# Patient Record
Sex: Male | Born: 1945 | Race: White | Hispanic: No | Marital: Single | Smoking: Former smoker
Health system: Southern US, Community
[De-identification: ages and names within clinical notes are randomized; demographics above are authoritative.]

## PROBLEM LIST (undated history)

## (undated) DIAGNOSIS — E7800 Pure hypercholesterolemia, unspecified: Secondary | ICD-10-CM

## (undated) DIAGNOSIS — N189 Chronic kidney disease, unspecified: Secondary | ICD-10-CM

## (undated) DIAGNOSIS — B20 Human immunodeficiency virus [HIV] disease: Secondary | ICD-10-CM

## (undated) DIAGNOSIS — Z21 Asymptomatic human immunodeficiency virus [HIV] infection status: Secondary | ICD-10-CM

## (undated) DIAGNOSIS — K625 Hemorrhage of anus and rectum: Secondary | ICD-10-CM

## (undated) DIAGNOSIS — C3490 Malignant neoplasm of unspecified part of unspecified bronchus or lung: Secondary | ICD-10-CM

## (undated) DIAGNOSIS — F419 Anxiety disorder, unspecified: Secondary | ICD-10-CM

## (undated) DIAGNOSIS — G629 Polyneuropathy, unspecified: Secondary | ICD-10-CM

## (undated) DIAGNOSIS — F329 Major depressive disorder, single episode, unspecified: Secondary | ICD-10-CM

## (undated) DIAGNOSIS — F32A Depression, unspecified: Secondary | ICD-10-CM

## (undated) DIAGNOSIS — J90 Pleural effusion, not elsewhere classified: Secondary | ICD-10-CM

## (undated) HISTORY — PX: TONSILLECTOMY: SUR1361

---

## 2002-04-15 ENCOUNTER — Emergency Department (HOSPITAL_COMMUNITY): Admission: EM | Admit: 2002-04-15 | Discharge: 2002-04-15 | Payer: Self-pay | Admitting: Emergency Medicine

## 2008-10-18 ENCOUNTER — Inpatient Hospital Stay (HOSPITAL_COMMUNITY): Admission: EM | Admit: 2008-10-18 | Discharge: 2008-10-21 | Payer: Self-pay | Admitting: Emergency Medicine

## 2008-10-18 LAB — CONVERTED CEMR LAB
HIV-2 Ab: NEGATIVE
HIV: POSITIVE

## 2008-10-19 ENCOUNTER — Ambulatory Visit: Payer: Self-pay | Admitting: Internal Medicine

## 2008-10-19 LAB — CONVERTED CEMR LAB
CD4 T Helper %: 7 %
HCV Ab: NEGATIVE
Hep A Total Ab: NEGATIVE
Hep B S Ab: NEGATIVE
Hepatitis B Surface Ag: NEGATIVE

## 2008-10-27 ENCOUNTER — Encounter (INDEPENDENT_AMBULATORY_CARE_PROVIDER_SITE_OTHER): Payer: Self-pay | Admitting: *Deleted

## 2008-10-27 ENCOUNTER — Ambulatory Visit: Payer: Self-pay | Admitting: Internal Medicine

## 2008-10-27 DIAGNOSIS — B459 Cryptococcosis, unspecified: Secondary | ICD-10-CM | POA: Insufficient documentation

## 2008-10-27 DIAGNOSIS — K029 Dental caries, unspecified: Secondary | ICD-10-CM | POA: Insufficient documentation

## 2008-10-27 DIAGNOSIS — F17200 Nicotine dependence, unspecified, uncomplicated: Secondary | ICD-10-CM

## 2008-10-27 DIAGNOSIS — Z87898 Personal history of other specified conditions: Secondary | ICD-10-CM

## 2008-10-27 DIAGNOSIS — B20 Human immunodeficiency virus [HIV] disease: Secondary | ICD-10-CM

## 2008-10-27 LAB — CONVERTED CEMR LAB

## 2008-10-31 ENCOUNTER — Ambulatory Visit: Payer: Self-pay | Admitting: Internal Medicine

## 2008-10-31 LAB — CONVERTED CEMR LAB
ALT: 19 units/L (ref 0–53)
BUN: 23 mg/dL (ref 6–23)
Basophils Absolute: 0 10*3/uL (ref 0.0–0.1)
Calcium: 9.2 mg/dL (ref 8.4–10.5)
Chloride: 96 meq/L (ref 96–112)
Creatinine, Ser: 1.13 mg/dL (ref 0.40–1.50)
Eosinophils Absolute: 0.3 10*3/uL (ref 0.0–0.7)
Eosinophils Relative: 6 % — ABNORMAL HIGH (ref 0–5)
GFR calc non Af Amer: >60 mL/min (ref >60)
HCT: 42.5 % (ref 39.0–52.0)
Lymphocytes Relative: 32 % (ref 12–46)
Neutrophils Relative %: 48 % (ref 43–77)
Phosphorus: 4 mg/dL (ref 2.3–4.6)
Platelets: 170 10*3/uL (ref 150–400)
RDW: 13.9 % (ref 11.5–15.5)
Total Protein: 7.4 g/dL (ref 6.0–8.3)

## 2008-11-10 ENCOUNTER — Encounter: Payer: Self-pay | Admitting: Licensed Clinical Social Worker

## 2008-11-17 ENCOUNTER — Ambulatory Visit: Payer: Self-pay | Admitting: Internal Medicine

## 2008-11-23 ENCOUNTER — Ambulatory Visit: Payer: Self-pay | Admitting: Internal Medicine

## 2008-11-30 ENCOUNTER — Telehealth: Payer: Self-pay | Admitting: Internal Medicine

## 2008-12-01 ENCOUNTER — Telehealth (INDEPENDENT_AMBULATORY_CARE_PROVIDER_SITE_OTHER): Payer: Self-pay | Admitting: *Deleted

## 2008-12-08 ENCOUNTER — Ambulatory Visit: Payer: Self-pay | Admitting: Internal Medicine

## 2008-12-13 ENCOUNTER — Ambulatory Visit: Payer: Self-pay | Admitting: Internal Medicine

## 2008-12-20 ENCOUNTER — Encounter: Payer: Self-pay | Admitting: Internal Medicine

## 2008-12-20 ENCOUNTER — Ambulatory Visit: Payer: Self-pay | Admitting: Internal Medicine

## 2008-12-20 LAB — CONVERTED CEMR LAB
ALT: 15 units/L (ref 0–53)
AST: 18 units/L (ref 0–37)
Alkaline Phosphatase: 94 units/L (ref 39–117)
BUN: 20 mg/dL (ref 6–23)
Bilirubin, Direct: 0.2 mg/dL (ref 0.0–0.3)
CD4 Count: 133 microliters
Chloride: 99 meq/L (ref 96–112)
Glucose, Bld: 95 mg/dL (ref 70–99)
HIV 1 RNA Quant: 179 copies/mL
Phosphorus: 2.9 mg/dL (ref 2.3–4.6)
Potassium: 5.5 meq/L — ABNORMAL HIGH (ref 3.5–5.3)
Sodium: 135 meq/L (ref 135–145)
Total Bilirubin: 1.3 mg/dL — ABNORMAL HIGH (ref 0.3–1.2)

## 2009-01-16 ENCOUNTER — Ambulatory Visit: Payer: Self-pay | Admitting: Internal Medicine

## 2009-01-16 ENCOUNTER — Encounter: Payer: Self-pay | Admitting: Internal Medicine

## 2009-01-16 LAB — CONVERTED CEMR LAB
ALT: 13 units/L (ref 0–53)
Alkaline Phosphatase: 111 units/L (ref 39–117)
Bilirubin, Direct: 0.2 mg/dL (ref 0.0–0.3)
CO2: 27 meq/L (ref 19–32)
Chloride: 101 meq/L (ref 96–112)
Creatinine, Ser: 1.11 mg/dL (ref 0.40–1.50)
Eosinophils Absolute: 0.4 10*3/uL (ref 0.0–0.7)
GC Probe Amp, Urine: NEGATIVE
Indirect Bilirubin: 0.8 mg/dL (ref 0.0–0.9)
Lymphocytes Relative: 42 % (ref 12–46)
Lymphs Abs: 2 10*3/uL (ref 0.7–4.0)
Monocytes Relative: 12 % (ref 3–12)
Neutro Abs: 1.8 10*3/uL (ref 1.7–7.7)
Neutrophils Relative %: 37 % — ABNORMAL LOW (ref 43–77)
Platelets: 186 10*3/uL (ref 150–400)
Potassium: 4.6 meq/L (ref 3.5–5.3)
RBC: 4.2 M/uL — ABNORMAL LOW (ref 4.22–5.81)
Sodium: 138 meq/L (ref 135–145)
WBC: 4.9 10*3/uL (ref 4.0–10.5)

## 2009-01-24 ENCOUNTER — Encounter: Payer: Self-pay | Admitting: Internal Medicine

## 2009-01-30 ENCOUNTER — Encounter: Payer: Self-pay | Admitting: Internal Medicine

## 2009-02-07 ENCOUNTER — Encounter (INDEPENDENT_AMBULATORY_CARE_PROVIDER_SITE_OTHER): Payer: Self-pay | Admitting: *Deleted

## 2009-02-14 ENCOUNTER — Ambulatory Visit: Payer: Self-pay | Admitting: Internal Medicine

## 2009-02-23 ENCOUNTER — Telehealth (INDEPENDENT_AMBULATORY_CARE_PROVIDER_SITE_OTHER): Payer: Self-pay | Admitting: *Deleted

## 2009-03-16 ENCOUNTER — Ambulatory Visit: Payer: Self-pay | Admitting: Internal Medicine

## 2009-03-16 LAB — CONVERTED CEMR LAB
Albumin: 4 g/dL (ref 3.5–5.2)
BUN: 15 mg/dL (ref 6–23)
CD4 Count: 171 microliters
CO2: 25 meq/L (ref 19–32)
Chloride: 98 meq/L (ref 96–112)
Creatinine, Urine: 181.1 mg/dL
HDL: 32 mg/dL — ABNORMAL LOW (ref >39)
Indirect Bilirubin: 0.9 mg/dL (ref 0.0–0.9)
LDL Cholesterol: 107 mg/dL — ABNORMAL HIGH (ref 0–99)
Potassium: 5.1 meq/L (ref 3.5–5.3)
Total Bilirubin: 1 mg/dL (ref 0.3–1.2)
Total Protein, Urine: 51
Total Protein: 7.6 g/dL (ref 6.0–8.3)
Triglycerides: 305 mg/dL — ABNORMAL HIGH (ref <150)
VLDL: 61 mg/dL — ABNORMAL HIGH (ref 0–40)

## 2009-03-20 ENCOUNTER — Ambulatory Visit: Payer: Self-pay | Admitting: Internal Medicine

## 2009-03-20 LAB — CONVERTED CEMR LAB
BUN: 12 mg/dL
CO2: 25 meq/L
Calcium: 9.2 mg/dL
Chloride: 101 meq/L
Creatinine, Ser: 1.2 mg/dL
Glucose, Bld: 93 mg/dL
Potassium: 4.4 meq/L
Sodium: 138 meq/L

## 2009-03-23 ENCOUNTER — Telehealth (INDEPENDENT_AMBULATORY_CARE_PROVIDER_SITE_OTHER): Payer: Self-pay | Admitting: *Deleted

## 2009-04-20 ENCOUNTER — Telehealth (INDEPENDENT_AMBULATORY_CARE_PROVIDER_SITE_OTHER): Payer: Self-pay | Admitting: *Deleted

## 2009-05-08 ENCOUNTER — Ambulatory Visit: Payer: Self-pay | Admitting: Internal Medicine

## 2009-05-08 LAB — CONVERTED CEMR LAB
ALT: 12 units/L (ref 0–53)
AST: 19 units/L (ref 0–37)
Albumin: 4 g/dL (ref 3.5–5.2)
Calcium: 9.5 mg/dL (ref 8.4–10.5)
Cholesterol: 188 mg/dL (ref 0–200)
HDL: 28 mg/dL — ABNORMAL LOW (ref >39)
Phosphorus: 3.1 mg/dL (ref 2.3–4.6)
Sodium: 139 meq/L (ref 135–145)
Total CHOL/HDL Ratio: 6.7
Triglycerides: 376 mg/dL — ABNORMAL HIGH (ref <150)

## 2009-05-10 ENCOUNTER — Ambulatory Visit: Payer: Self-pay | Admitting: Internal Medicine

## 2009-05-10 LAB — CONVERTED CEMR LAB
CO2: 24 meq/L (ref 19–32)
Calcium: 9.8 mg/dL (ref 8.4–10.5)
Creatinine, Ser: 1.39 mg/dL (ref 0.40–1.50)

## 2009-05-31 ENCOUNTER — Telehealth (INDEPENDENT_AMBULATORY_CARE_PROVIDER_SITE_OTHER): Payer: Self-pay | Admitting: *Deleted

## 2009-06-14 ENCOUNTER — Ambulatory Visit: Payer: Self-pay | Admitting: Internal Medicine

## 2009-06-20 ENCOUNTER — Telehealth (INDEPENDENT_AMBULATORY_CARE_PROVIDER_SITE_OTHER): Payer: Self-pay | Admitting: *Deleted

## 2009-06-29 ENCOUNTER — Encounter (INDEPENDENT_AMBULATORY_CARE_PROVIDER_SITE_OTHER): Payer: Self-pay | Admitting: *Deleted

## 2009-07-26 ENCOUNTER — Telehealth (INDEPENDENT_AMBULATORY_CARE_PROVIDER_SITE_OTHER): Payer: Self-pay | Admitting: *Deleted

## 2009-07-31 ENCOUNTER — Encounter: Payer: Self-pay | Admitting: Internal Medicine

## 2009-07-31 ENCOUNTER — Ambulatory Visit: Payer: Self-pay | Admitting: Internal Medicine

## 2009-07-31 ENCOUNTER — Encounter: Payer: Self-pay | Admitting: *Deleted

## 2009-07-31 LAB — CONVERTED CEMR LAB
AST: 16 units/L (ref 0–37)
Albumin: 4 g/dL (ref 3.5–5.2)
Alkaline Phosphatase: 144 units/L — ABNORMAL HIGH (ref 39–117)
Bilirubin, Direct: 0.2 mg/dL (ref 0.0–0.3)
CD4 Count: 172 microliters
CO2: 23 meq/L (ref 19–32)
Calcium: 9 mg/dL (ref 8.4–10.5)
Creatinine, Ser: 0.93 mg/dL (ref 0.40–1.50)
Crypto Ag: NEGATIVE
HDL: 28 mg/dL — ABNORMAL LOW (ref >39)
Indirect Bilirubin: 0.9 mg/dL (ref 0.0–0.9)
Total Bilirubin: 1.1 mg/dL (ref 0.3–1.2)

## 2009-08-25 ENCOUNTER — Telehealth (INDEPENDENT_AMBULATORY_CARE_PROVIDER_SITE_OTHER): Payer: Self-pay | Admitting: *Deleted

## 2009-09-12 ENCOUNTER — Ambulatory Visit: Payer: Self-pay | Admitting: Internal Medicine

## 2009-09-12 DIAGNOSIS — M545 Low back pain: Secondary | ICD-10-CM

## 2009-09-14 ENCOUNTER — Telehealth (INDEPENDENT_AMBULATORY_CARE_PROVIDER_SITE_OTHER): Payer: Self-pay | Admitting: *Deleted

## 2009-10-11 ENCOUNTER — Telehealth (INDEPENDENT_AMBULATORY_CARE_PROVIDER_SITE_OTHER): Payer: Self-pay | Admitting: *Deleted

## 2009-10-23 ENCOUNTER — Ambulatory Visit: Payer: Self-pay | Admitting: Internal Medicine

## 2009-10-23 LAB — CONVERTED CEMR LAB
Albumin: 3.9 g/dL (ref 3.5–5.2)
CO2: 24 meq/L (ref 19–32)
Chloride: 102 meq/L (ref 96–112)
HDL: 34 mg/dL — ABNORMAL LOW (ref >39)
Hemoglobin, Urine: NEGATIVE
Ketones, ur: NEGATIVE mg/dL
LDL Cholesterol: 122 mg/dL — ABNORMAL HIGH (ref 0–99)
Leukocytes, UA: NEGATIVE
Nitrite: NEGATIVE
Phosphorus: 2.8 mg/dL (ref 2.3–4.6)
Potassium: 4.5 meq/L (ref 3.5–5.3)
Protein, ur: NEGATIVE mg/dL
Sodium: 138 meq/L (ref 135–145)
Total Bilirubin: 0.8 mg/dL (ref 0.3–1.2)
Total CHOL/HDL Ratio: 6.2
Total Protein, Urine: 34
VLDL: 55 mg/dL — ABNORMAL HIGH (ref 0–40)

## 2009-11-28 ENCOUNTER — Telehealth (INDEPENDENT_AMBULATORY_CARE_PROVIDER_SITE_OTHER): Payer: Self-pay | Admitting: *Deleted

## 2009-11-29 ENCOUNTER — Encounter (INDEPENDENT_AMBULATORY_CARE_PROVIDER_SITE_OTHER): Payer: Self-pay | Admitting: *Deleted

## 2009-12-26 ENCOUNTER — Ambulatory Visit: Payer: Self-pay | Admitting: Internal Medicine

## 2009-12-26 DIAGNOSIS — M2550 Pain in unspecified joint: Secondary | ICD-10-CM | POA: Insufficient documentation

## 2010-02-14 ENCOUNTER — Ambulatory Visit: Payer: Self-pay | Admitting: Internal Medicine

## 2010-02-14 ENCOUNTER — Encounter: Payer: Self-pay | Admitting: Internal Medicine

## 2010-02-14 LAB — CONVERTED CEMR LAB
ALT: 13 units/L (ref 0–53)
BUN: 18 mg/dL (ref 6–23)
Bilirubin, Direct: 0.2 mg/dL (ref 0.0–0.3)
CD4 Count: 176 microliters
CO2: 28 meq/L (ref 19–32)
Cholesterol: 196 mg/dL (ref 0–200)
Glucose, Bld: 93 mg/dL (ref 70–99)
Indirect Bilirubin: 1.2 mg/dL — ABNORMAL HIGH (ref 0.0–0.9)
Phosphorus: 2.6 mg/dL (ref 2.3–4.6)
Potassium: 4.6 meq/L (ref 3.5–5.3)
Total Bilirubin: 1.4 mg/dL — ABNORMAL HIGH (ref 0.3–1.2)
VLDL: 71 mg/dL — ABNORMAL HIGH (ref 0–40)

## 2010-02-15 ENCOUNTER — Ambulatory Visit: Payer: Self-pay | Admitting: Internal Medicine

## 2010-02-16 LAB — CONVERTED CEMR LAB
CO2: 25 meq/L (ref 19–32)
Calcium: 9.5 mg/dL (ref 8.4–10.5)
Chloride: 100 meq/L (ref 96–112)
Glucose, Bld: 102 mg/dL — ABNORMAL HIGH (ref 70–99)
Potassium: 4.3 meq/L (ref 3.5–5.3)
Sodium: 137 meq/L (ref 135–145)

## 2010-03-07 ENCOUNTER — Encounter: Payer: Self-pay | Admitting: *Deleted

## 2010-03-22 ENCOUNTER — Ambulatory Visit: Payer: Self-pay | Admitting: Internal Medicine

## 2010-04-02 ENCOUNTER — Ambulatory Visit: Payer: Self-pay | Admitting: Internal Medicine

## 2010-04-02 LAB — CONVERTED CEMR LAB
CO2: 26 meq/L (ref 19–32)
Chloride: 102 meq/L (ref 96–112)
Glucose, Bld: 101 mg/dL — ABNORMAL HIGH (ref 70–99)
Potassium: 5.3 meq/L (ref 3.5–5.3)
Sodium: 140 meq/L (ref 135–145)

## 2010-04-16 ENCOUNTER — Encounter (INDEPENDENT_AMBULATORY_CARE_PROVIDER_SITE_OTHER): Payer: Self-pay | Admitting: *Deleted

## 2010-06-05 ENCOUNTER — Encounter: Payer: Self-pay | Admitting: Internal Medicine

## 2010-06-05 ENCOUNTER — Ambulatory Visit
Admission: RE | Admit: 2010-06-05 | Discharge: 2010-06-05 | Payer: Self-pay | Source: Home / Self Care | Attending: Internal Medicine | Admitting: Internal Medicine

## 2010-06-05 LAB — CONVERTED CEMR LAB
CD4 Count: 202 microliters
HIV 1 RNA Quant: 39 copies/mL

## 2010-06-06 ENCOUNTER — Encounter: Payer: Self-pay | Admitting: Internal Medicine

## 2010-06-06 LAB — CONVERTED CEMR LAB
AST: 21 units/L (ref 0–37)
Albumin: 4.1 g/dL (ref 3.5–5.2)
Alkaline Phosphatase: 161 units/L — ABNORMAL HIGH (ref 39–117)
BUN: 17 mg/dL (ref 6–23)
Bilirubin, Direct: 0.2 mg/dL (ref 0.0–0.3)
CO2: 28 meq/L (ref 19–32)
Calcium: 10 mg/dL (ref 8.4–10.5)
Chloride: 101 meq/L (ref 96–112)
Creatinine, Ser: 1.24 mg/dL (ref 0.40–1.50)
Glucose, Bld: 98 mg/dL (ref 70–99)
HDL: 30 mg/dL — ABNORMAL LOW (ref >39)
Total Bilirubin: 1.1 mg/dL (ref 0.3–1.2)

## 2010-06-07 ENCOUNTER — Encounter (INDEPENDENT_AMBULATORY_CARE_PROVIDER_SITE_OTHER): Payer: Self-pay | Admitting: *Deleted

## 2010-06-17 LAB — CONVERTED CEMR LAB
Albumin: 3.9 g/dL (ref 3.5–5.2)
Alkaline Phosphatase: 83 units/L (ref 39–117)
CD4 Count: 100 microliters
Calcium: 9.4 mg/dL (ref 8.4–10.5)
Creatinine, Ser: 0.98 mg/dL (ref 0.40–1.50)
Creatinine, Ser: 1.05 mg/dL (ref 0.40–1.50)
Glucose, Urine, Semiquant: NEGATIVE
Hep B S Ab: NEGATIVE
Indirect Bilirubin: 0.3 mg/dL (ref 0.0–0.9)
LDL Cholesterol: 124 mg/dL — ABNORMAL HIGH (ref 0–99)
Sodium: 139 meq/L (ref 135–145)
Total Bilirubin: 0.4 mg/dL (ref 0.3–1.2)
Total CHOL/HDL Ratio: 5.9
Total Protein: 7.5 g/dL (ref 6.0–8.3)
VLDL: 46 mg/dL — ABNORMAL HIGH (ref 0–40)
WBC Urine, dipstick: NEGATIVE
pH: 5.5

## 2010-06-19 NOTE — Assessment & Plan Note (Signed)
Summary: F/U OV/VS   CC:  follow-up visit and c/o joint pain.  History of Present Illness: [name] [name] is in for his routine visit.  In May his mother was admitted to the hospital after a series of strokes.  Since that time she has developed a vascular dementia.  He is now having to provide full around the clock care for her.  This has been very stressful.  He says that he is not eating as well and has lost weight.  He also notes some aching pain in her shoulders arms hands and hips.  He is not tried taking anything for the pain.  He denies missing a single dose of his HIV medications.  Preventive Screening-Counseling & Management  Alcohol-Tobacco     Alcohol drinks/day: <1     Smoking Status: current     Smoking Cessation Counseling: yes     Smoke Cessation Stage: precontemplative     Packs/Day: 1.0     Year Started: 1966  Caffeine-Diet-Exercise     Caffeine use/day: yes     Does Patient Exercise: yes     Type of exercise: weights, and walking     Exercise (avg: min/session): 30-60     Times/week: 6  Hep-HIV-STD-Contraception     HIV Risk: no risk noted     Dental Visit-last 6 months yes  Safety-Violence-Falls     Seat Belt Use: yes      Sexual History:  n/a.        Drug Use:  never.    Comments: pt. declined condoms   Current Allergies (reviewed today): ! * UNKNOWN PAIN MEDICATION Vital Signs:  Patient profile:   65 year old male Height:      66.5 inches (168.91 cm) Weight:      115.8 pounds (52.64 kg) BMI:     18.48 Temp:     97.7 degrees F (36.50 degrees C) oral Pulse rate:   89 / minute BP sitting:   125 / 84  (left arm)  Vitals Entered By: Wendall Mola CMA Duncan Dull) (December 26, 2009 8:51 AM) CC: follow-up visit, c/o joint pain Is Patient Diabetic? No Pain Assessment Patient in pain? yes     Location: joints Intensity: 6 Type: stiffness Onset of pain  Constant Nutritional Status BMI of < 19 = underweight Nutritional Status Detail appetite  "low"  Does patient need assistance? Functional Status Self care Ambulation Normal Comments no missed doses of meds per patient   Physical Exam  General:  alert and underweight appearing.  he has lost 5 pounds. Mouth:  he has a small growth on the right corner of his mouth where he had previous perleche.  It has a benign appearance his mouth is clear. Lungs:  normal breath sounds, no crackles, and no wheezes.   Heart:  normal rate, regular rhythm, and no murmur.   Abdomen:  soft, non-tender, and normal bowel sounds.   Msk:  normal ROM, no joint tenderness, no joint swelling, no joint warmth, and no joint deformities.          Medication Adherence: 12/26/2009   Adherence to medications reviewed with patient. Counseling to provide adequate adherence provided                                Impression & Recommendations:  Problem # 1:  HIV INFECTION (ICD-042) His HIV infection remains suppressed and he is having slow CD4 reconstitution.  I will not make any changes today His updated medication list for this problem includes:    Diflucan 150 Mg Tabs (Fluconazole) .Marland Kitchen... Take 3 tablets by mouth once a day    Septra Ds 800-160 Mg Tabs (Sulfamethoxazole-trimethoprim) .Marland Kitchen... Take 1 tablet by every monday, wednesday and friday    Valtrex 1 Gm Tabs (Valacyclovir hcl) .Marland Kitchen... 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 Patient Level III (99213)Future Orders: T- * Misc. Laboratory test [phone]) ... 02/22/2010  Problem # 2:  ARTHRALGIA (ICD-719.40) I am not sure what is causing his new arthralgias but it may be related to be much more active physically since he is now providing full care for his  mother.  I have suggested that he try a low dose ibuprofen. Orders: Est. Patient Level III (60454)  Patient Instructions: 1)  Please schedule a follow-up appointment in 3 months.

## 2010-06-19 NOTE — Miscellaneous (Signed)
Summary: HIV-1 RNA, CD4 (RESEARCH)  Clinical Lists Changes  Observations: Added new observation of CD4 COUNT: 172 microliters (07/31/2009 10:10) Added new observation of HIV1RNA QA: 39 copies/mL (07/31/2009 10:10)

## 2010-06-19 NOTE — Miscellaneous (Signed)
Summary: clinical update/ryan white [place] approved til 08/18/10  Clinical Lists Changes  Observations: Added new observation of AIDSDAP: Yes 2011 (06/29/2009 16:27)

## 2010-06-19 NOTE — Assessment & Plan Note (Signed)
Summary: RESEARCH/VS    Current Allergies: ! * UNKNOWN PAIN MEDICATION Patient here to have BMET redrawn. His creatinine from yesterday was 1.36 and the calculated creatinine clearance was 43.3. If it is still <50, we will have him dose his Truvada every other day.Deirdre Evener RN  February 15, 2010 2:26 PM    Other Orders: Est. Patient Research Study [phone]) T-Basic Metabolic Panel [phone])

## 2010-06-19 NOTE — Progress Notes (Signed)
Summary: [employer]/pt assist meds arrived for Jan  Phone Note Refill Request      Prescriptions: NORVIR 100 MG TABS (RITONAVIR) 1 by mouth daily  #30 x 0   Entered by:   Paulo Fruit  BS,CPht II,MPH   Authorized by:   Cliffton Asters MD   Signed by:   Paulo Fruit  BS,CPht II,MPH on 05/31/2009   Method used:   Samples Given   RxID:   [id number] SEPTRA DS 800-160 MG TABS (SULFAMETHOXAZOLE-TRIMETHOPRIM) Take 1 tablet by mouth daily  #30 x 0   Entered by:   Paulo Fruit  BS,CPht II,MPH   Authorized by:   Cliffton Asters MD   Signed by:   Paulo Fruit  BS,CPht II,MPH on 05/31/2009   Method used:   Samples Given   RxID:   [id number] ZITHROMAX 600 MG TABS (AZITHROMYCIN) Take2 tablets by mouth once a week  #8 x 0   Entered by:   Paulo Fruit  BS,CPht II,MPH   Authorized by:   Cliffton Asters MD   Signed by:   Paulo Fruit  BS,CPht II,MPH on 05/31/2009   Method used:   Samples Given   RxID:   [id number] DIFLUCAN 150 MG TABS (FLUCONAZOLE) Take 3 tablets by mouth once a day  #90 x 0   Entered by:   Paulo Fruit  BS,CPht II,MPH   Authorized by:   Cliffton Asters MD   Signed by:   Paulo Fruit  BS,CPht II,MPH on 05/31/2009   Method used:   Samples Given   RxID:   [id number]   Patient Assist Medication Verification: Medication: Fluconazole 150mg  DUK#[id number] Exp Date:05 2012 Tech approval:MLD                Patient Assist Medication Verification: Medication: Norvir 100mg  CWC#[id number] Exp Date:20 Jul 2010 Tech approval:MLD               Patient Assist Medication Verification: Medication name:SMZ-TMP DS 800/160mg  RX # [id number] Tech approval:MLD  Patient Assist Medication Verification: Medication name:Azithromycin 600mg  RX # [id number] Tech approval:MLD  Call placed to patient with message that assistance medications are ready for pick-up. Patient stated he will pick up at his appt w/Maria 06/05/09 Paulo Fruit  BS,CPht II,MPH  May 31, 2009 3:26 PM  Appended  Document: [employer]AP/pt assist meds arrived for Jan Prescription/Samples picked up by: patient Patient completed [place] app for 2011-2012

## 2010-06-19 NOTE — Progress Notes (Signed)
Summary: [employer]/pt assist meds arrived for May  Phone Note Refill Request      Prescriptions: DIFLUCAN 150 MG TABS (FLUCONAZOLE) Take 3 tablets by mouth once a day  #90 x 0   Entered by:   Paulo Fruit  BS,CPht II,MPH   Authorized by:   Cliffton Asters MD   Signed by:   Paulo Fruit  BS,CPht II,MPH on 10/11/2009   Method used:   Samples Given   RxID:   [id number] SEPTRA DS 800-160 MG TABS (SULFAMETHOXAZOLE-TRIMETHOPRIM) Take 1 tablet by every Monday, Wednesday and Friday  #12 x 0   Entered by:   Paulo Fruit  BS,CPht II,MPH   Authorized by:   Cliffton Asters MD   Signed by:   Paulo Fruit  BS,CPht II,MPH on 10/11/2009   Method used:   Samples Given   RxID:   [id number] NORVIR 100 MG TABS (RITONAVIR) 1 by mouth daily  #30 x 0   Entered by:   Paulo Fruit  BS,CPht II,MPH   Authorized by:   Cliffton Asters MD   Signed by:   Paulo Fruit  BS,CPht II,MPH on 10/11/2009   Method used:   Samples Given   RxID:   [id number]   Patient Assist Medication Verification: Medication: Fluconazole 150mg  Lot# [id number] Exp Date:10 2012 Tech approval:MLD                Patient Assist Medication Verification: Medication:Norvir 100mg  ZDG#[id number] Exp Date:24 May 2011 Tech approval:MLD               Patient Assist Medication Verification: Medication name:SMZ-TMP DS 800/160mg  RX # [id number] Tech approval:MLD  Call placed to patient with message that assistance medications are ready for pick-up. Patient said he will pick up during his Jun 6 appt with Research nurse. Paulo Fruit  BS,CPht II,MPH  Oct 11, 2009 11:28 AM  Appended Document: [employer]/pt assist meds arrived for May Prescription/Samples picked up by: patient

## 2010-06-19 NOTE — Progress Notes (Signed)
Summary: [employer]AP/pt assist meds arrived for Apr  Phone Note Refill Request      Prescriptions: SEPTRA DS 800-160 MG TABS (SULFAMETHOXAZOLE-TRIMETHOPRIM) Take 1 tablet by every Monday, Wednesday and Friday  #12 x 0   Entered by:   Paulo Fruit  BS,CPht II,MPH   Authorized by:   Cliffton Asters MD   Signed by:   Paulo Fruit  BS,CPht II,MPH on 08/25/2009   Method used:   Samples Given   RxID:   [id number] DIFLUCAN 150 MG TABS (FLUCONAZOLE) Take 3 tablets by mouth once a day  #90 x 0   Entered by:   Paulo Fruit  BS,CPht II,MPH   Authorized by:   Cliffton Asters MD   Signed by:   Paulo Fruit  BS,CPht II,MPH on 08/25/2009   Method used:   Samples Given   RxID:   [id number] NORVIR 100 MG TABS (RITONAVIR) 1 by mouth daily  #30 x 0   Entered by:   Paulo Fruit  BS,CPht II,MPH   Authorized by:   Cliffton Asters MD   Signed by:   Paulo Fruit  BS,CPht II,MPH on 08/25/2009   Method used:   Samples Given   RxID:   [id number]  Patient Assist Medication Verification: Medication name:SMZ-TMP DS 800mg /160mg  RX # [id number] Tech approval:MLD   Patient Assist Medication Verification: Medication: Norvir 100mg  Lot# [id number] Exp Date:18 Feb 2011 Tech approval:MLD                Patient Assist Medication Verification: Medication: Fluconazole 150mg  Lot# [id number] Exp Date:09 2012 Tech approval:MLD Call placed to patient with message that assistance medications are ready for pick-up. Paulo Fruit  BS,CPht II,MPH  August 25, 2009 2:54 PM                   Appended Document: NCADAP/pt assist meds arrived for Apr Prescription/Samples picked up by: patient

## 2010-06-19 NOTE — Assessment & Plan Note (Signed)
Summary: F/U/VS   CC:  follow-up visit, takes Aleve and Osteo BiFlex, vit C, Vit D, Vit B Complete, and and a multivitamin.  History of Present Illness: [name] [name] is in for his routine visit.  He has had some low back pain radiating into his anterior thighs since he fell on the ice this past winter.  He takes occasional Advil for it but says that the pain is definitely getting better with time.  It is nearly resolved and he is back doing his usual activities.  He is started taking multivitamins and supplemental vitamin B, C, D, and Osteo Bi-Flex. He denies missing any doses of his HIV medications.  He continues to smoke cigarettes and does not feel like he is ready to quit yet.  He has not been sexually active since his diagnosis.  He says he is feeling much better overall and is pleased to be gaining weight.  Preventive Screening-Counseling & Management  Alcohol-Tobacco     Alcohol drinks/day: <1     Smoking Status: current     Smoking Cessation Counseling: yes     Smoke Cessation Stage: precontemplative     Packs/Day: 1.0     Year Started: 1966  Caffeine-Diet-Exercise     Caffeine use/day: yes     Does Patient Exercise: yes     Type of exercise: weights, and walking     Exercise (avg: min/session): 30-60     Times/week: 6  Hep-HIV-STD-Contraception     HIV Risk: no risk noted     Dental Visit-last 6 months yes  Safety-Violence-Falls     Seat Belt Use: yes      Sexual History:  n/a.        Drug Use:  never.     Updated Prior Medication List: TRUVADA 200-300 MG TABS (EMTRICITABINE-TENOFOVIR) Take one tablet by mouth every other day REYATAZ 300 MG CAPS (ATAZANAVIR SULFATE) 1 by mouth daily with ritonavir and food NORVIR 100 MG TABS (RITONAVIR) 1 by mouth daily DIFLUCAN 150 MG TABS (FLUCONAZOLE) Take 3 tablets by mouth once a day SEPTRA DS 800-160 MG TABS (SULFAMETHOXAZOLE-TRIMETHOPRIM) Take 1 tablet by every Monday, Wednesday and Friday PROCHLORPERAZINE MALEATE 10 MG  TABS (PROCHLORPERAZINE MALEATE) 1 by mouth q 6h prn VALTREX 1 GM TABS (VALACYCLOVIR HCL) 1 by mouth three times a day for 7days CENTRUM SILVER  TABS (MULTIPLE VITAMINS-MINERALS) Take 1 tablet by mouth once a day B COMPLEX VITAMINS PLUS  TABS (MULTIPLE VITAMINS-MINERALS) Take 1 tablet by mouth once a day VITAMIN C CR 500 MG CR-TABS (ASCORBIC ACID) Take 1 tablet by mouth once a day VITAMIN D 400 UNIT TABS (CHOLECALCIFEROL) Take 3 tablets by mouth once a day OSTEO BI-FLEX ADV JOINT SHIELD  TABS (MISC NATURAL PRODUCTS) Take 1 tablet by mouth once a day  Current Allergies (reviewed today): ! * UNKNOWN PAIN MEDICATION Vital Signs:  Patient profile:   64 year old male Height:      66.5 inches (168.91 cm) Weight:      124.5 pounds (56.59 kg) BMI:     19.87 Temp:     97.8 degrees F (36.56 degrees C) oral Pulse rate:   77 / minute BP sitting:   119 / 79  (left arm) Cuff size:   regular  Vitals Entered By: Jennet Maduro RN (September 12, 2009 10:08 AM) CC: follow-up visit, takes Aleve and Osteo BiFlex, vit C, Vit D, Vit B Complete, and a multivitamin Is Patient Diabetic? No Pain Assessment Patient in pain? yes  Location: lower extrimities Intensity: 4 Type: muscle pain Onset of pain  With activity Nutritional Status BMI of 19 -24 = normal Nutritional Status Detail teeth a problem at this time, going to get them taken care of next month  Have you ever been in a relationship where you felt threatened, hurt or afraid?No   Does patient need assistance? Functional Status Self care Ambulation Normal Comments no missed doses of rxes   Physical Exam  General:  alert and well-nourished.   Mouth:  new, full dentures.  He does have nonspecific cracking of his upper and lower lips. pharynx pink and moist, no erythema, and no exudates.  he has a soft raised lesion at the right corner of his mouth which is unchanged. Lungs:  normal breath sounds, no crackles, and no wheezes.   Heart:  normal  rate, regular rhythm, and no murmur.   Psych:  Oriented X3, normally interactive, good eye contact, not anxious appearing, and not depressed appearing.          Medication Adherence: 09/12/2009   Adherence to medications reviewed with patient. Counseling to provide adequate adherence provided   Prevention For Positives: 09/12/2009   Safe sex practices discussed with patient. Condoms offered.                             Impression & Recommendations:  Problem # 1:  HIV INFECTION (ICD-042) His viral load is undetectable and CD4 count is slowly reconstituting.  I will continue his current regimen. His updated medication list for this problem includes:    Diflucan 150 Mg Tabs (Fluconazole) .Marland Kitchen... Take 3 tablets by mouth once a day    Septra Ds 800-160 Mg Tabs (Sulfamethoxazole-trimethoprim) .Marland Kitchen... Take 1 tablet by every monday, wednesday and friday    Valtrex 1 Gm Tabs (Valacyclovir hcl) .Marland Kitchen... 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 Patient Level IV (99214)Future Orders: T-CD4SP ([facility]) (CD4SP) ... 12/11/2009 T-HIV Viral Load [phone]) ... 12/11/2009  Problem # 2:  LOW BACK PAIN, ACUTE (ICD-724.2) His acute low back pain is improving. Orders: Est. Patient Level IV (09323)  Problem # 3:  CIGARETTE SMOKER (ICD-305.1) I have encouraged him to quit smoking cigarettes. Orders: Est. Patient Level IV (55732)  Medications Added to Medication List This Visit: 1)  Centrum Silver Tabs (Multiple vitamins-minerals) .... Take 1 tablet by mouth once a day 2)  Vitamin D 400 Unit Tabs (Cholecalciferol) .... Take 3 tablets by mouth once a day 3)  Osteo Bi-flex Adv Joint Shield Tabs (Misc  natural products) .... Take 1 tablet by mouth once a day  Other Orders: Future Orders: T- * Misc. Laboratory test [phone]) ... 12/12/2009  Patient Instructions: 1)  Please schedule a follow-up appointment in 3 months.

## 2010-06-19 NOTE — Miscellaneous (Signed)
  Clinical Lists Changes 

## 2010-06-19 NOTE — Progress Notes (Signed)
Summary: [employer]/pt assist meds arrived for apr  Phone Note Refill Request      Prescriptions: SEPTRA DS 800-160 MG TABS (SULFAMETHOXAZOLE-TRIMETHOPRIM) Take 1 tablet by every Monday, Wednesday and Friday  #12 x 0   Entered by:   Paulo Fruit  BS,CPht II,MPH   Authorized by:   Cliffton Asters MD   Signed by:   Paulo Fruit  BS,CPht II,MPH on 09/14/2009   Method used:   Samples Given   RxID:   [id number] NORVIR 100 MG TABS (RITONAVIR) 1 by mouth daily  #30 x 0   Entered by:   Paulo Fruit  BS,CPht II,MPH   Authorized by:   Cliffton Asters MD   Signed by:   Paulo Fruit  BS,CPht II,MPH on 09/14/2009   Method used:   Samples Given   RxID:   [id number] DIFLUCAN 150 MG TABS (FLUCONAZOLE) Take 3 tablets by mouth once a day  #90 x 0   Entered by:   Paulo Fruit  BS,CPht II,MPH   Authorized by:   Cliffton Asters MD   Signed by:   Paulo Fruit  BS,CPht II,MPH on 09/14/2009   Method used:   Samples Given   RxID:   [id number]   Patient Assist Medication Verification: Medication: Fluconazole 150mg  Lot# [id number] Exp Date:09 2012 Tech approval:MLD                Patient Assist Medication Verification: Medication:Norvir 100mg  Lot# [id number] Exp Date:19 Jan 2011 Tech approval:MLD               Patient Assist Medication Verification: Medication name:SMZ-TMP DS 800/160mg  RX # [id number] Tech approval:MLD Call placed to patient with message that assistance medications are ready for pick-up.' Left message for patient to contact [facility] @ [phone] Paulo Fruit  BS,CPht II,MPH  September 14, 2009 4:05 PM

## 2010-06-19 NOTE — Miscellaneous (Signed)
Summary: Orders Update  Clinical Lists Changes  Orders: Added new Test order of T-RPR (Syphilis) [phone]) - Signed Added new Test order of T-Cryptococcal Ag Blood [facility]) (CRPT) - Signed Added new Test order of T- * Misc. Laboratory test [phone]) - Signed     Process Orders Check Orders Results:     [facility]: ABN not required for this insurance Tests Sent for requisitioning (July 31, 2009 10:20 AM):     07/31/2009: [facility] -- T-RPR (Syphilis) [phone] (signed)     07/31/2009: [facility] -- T- * Misc. Laboratory test [id number] (signed)

## 2010-06-19 NOTE — Assessment & Plan Note (Signed)
Summary: 3 MONTH F/U [MKJ]   CC:  follow-up visit, taking total care of his mother, vascular demetia, and skin and mucous membranes around oral area giving him a lot of problems.  History of Present Illness: [name] [name] is in for his routine visit.  He does not recall missing any doses of his HIV medications.  His only current problems have been aching in his muscles and joints.  He finds that if he takes one Aleve every two to 3 days this provides significant relief.  He continues to care for his aging mother who has the early stages of dementia.  His appetite is better and he has gained some weight.  His only other problem has been the cracking of the lips which he finds quite bothersome.  He is tried a variety of over-the-counter preparations including Chapstick and Blistex without any relief.  He continues to smoke cigarettes and states that he is not ready to quit.  Preventive Screening-Counseling & Management  Alcohol-Tobacco     Alcohol drinks/day: <1     Smoking Status: current     Smoking Cessation Counseling: yes     Smoke Cessation Stage: precontemplative     Packs/Day: 1.0     Year Started: 1966  Caffeine-Diet-Exercise     Caffeine use/day: yes     Does Patient Exercise: yes     Type of exercise: weights, and walking     Exercise (avg: min/session): 30-60     Times/week: 6  Hep-HIV-STD-Contraception     HIV Risk: no risk noted     HIV Risk Counseling: not indicated-no HIV risk noted     Dental Visit-last 6 months yes, going to get his dentures 'realigned  Safety-Violence-Falls     Seat Belt Use: yes  Comments: declined condoms      Sexual History:  n/a.        Drug Use:  never.     Prior Medication List:  TRUVADA 200-300 MG TABS (EMTRICITABINE-TENOFOVIR) Take one tablet by mouth every other day REYATAZ 300 MG CAPS (ATAZANAVIR SULFATE) 1 by mouth daily with ritonavir and food NORVIR 100 MG TABS (RITONAVIR) 1 by mouth daily DIFLUCAN 150 MG TABS (FLUCONAZOLE) Take 3  tablets by mouth once a day SEPTRA DS 800-160 MG TABS (SULFAMETHOXAZOLE-TRIMETHOPRIM) Take 1 tablet by every Monday, Wednesday and Friday PROCHLORPERAZINE MALEATE 10 MG TABS (PROCHLORPERAZINE MALEATE) 1 by mouth q 6h prn VALTREX 1 GM TABS (VALACYCLOVIR HCL) 1 by mouth three times a day for 7days CENTRUM SILVER  TABS (MULTIPLE VITAMINS-MINERALS) Take 1 tablet by mouth once a day VITAMIN D 400 UNIT TABS (CHOLECALCIFEROL) Take 3 tablets by mouth once a day   Current Allergies (reviewed today): ! * UNKNOWN PAIN MEDICATION Vital Signs:  Patient profile:   65 year old male Height:      66.5 inches (168.91 cm) Weight:      123.5 pounds (56.14 kg) BMI:     19.71 Pulse rate:   71 / minute BP sitting:   119 / 74  (left arm) Cuff size:   regular  Vitals Entered By: Jennet Maduro RN (March 22, 2010 9:17 AM) CC: follow-up visit,  taking total care of his mother, vascular demetia, skin and mucous membranes around oral area giving him a lot of problems Is Patient Diabetic? No Pain Assessment Patient in pain? no      Nutritional Status BMI of 19 -24 = normal Nutritional Status Detail appetite "better"  Have you ever been in a relationship where  you felt threatened, hurt or afraid?No   Does patient need assistance? Functional Status Self care Ambulation Normal Comments no missed doses   Physical Exam  General:  alert and well-nourished.   Mouth:  he has a small growth on the right corner of his mouth where he had previous perleche.  It has a benign appearance his mouth is clear. he continues to have cracking of his lip strictly his lower lip. Lungs:  normal breath sounds, no crackles, and no wheezes.   Heart:  normal rate, regular rhythm, and no murmur.   Abdomen:  soft, non-tender, and normal bowel sounds.   Msk:  normal ROM, no joint tenderness, no joint swelling, no joint warmth, no redness over joints, and no joint deformities.   Skin:  no rashes.   Psych:  Oriented X3,  normally interactive, good eye contact, not anxious appearing, and not depressed appearing.      Impression & Recommendations:  Problem # 1:  HIV INFECTION (ICD-042) His infection remains under good control and he is having slow CD4 reconstitution.  I will continue his current regimen, monitor his creatinine and determine if we need to make other adjustments in the doses of his medications. His updated medication list for this problem includes:    Diflucan 150 Mg Tabs (Fluconazole) .Marland Kitchen... Take 3 tablets by mouth once a day    Septra Ds 800-160 Mg Tabs (Sulfamethoxazole-trimethoprim) .Marland Kitchen... Take 1 tablet by every monday, wednesday and friday    Valtrex 1 Gm Tabs (Valacyclovir hcl) .Marland Kitchen... 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  Once his CD4 count is over 200 will stop fluconazole. Orders: Est. 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  I will have him try over-the-counter low potency cortisone cream along with Vaseline.  I've also asked him to speak with his dentist when he returns to have his dentures refitted Orders: Est. Patient Level IV (09811)  Problem # 4:  ARTHRALGIA (ICD-719.40) I do not see any obvious cause for his arthralgias and myalgias and think it is probably best for him to continue to use occasional NSAIDs for symptomatic relief.  Of course we will need to monitor his renal function on this. Orders: Est. Patient Level IV (91478)  Problem # 5:  CIGARETTE SMOKER (ICD-305.1) I have encouraged him to  quit smoking cigarettes. Orders: Est. Patient Level IV (29562)  Medications Added to Medication List This Visit: 1)  Truvada 200-300 Mg Tabs (Emtricitabine-tenofovir) .... Take one tablet by mouth every other day  Patient Instructions: 1)  Please schedule a follow-up appointment in 6 months.   Prevention & Chronic Care Immunizations   Influenza vaccine: Fluvax Non-MCR  (02/14/2010)    Tetanus booster: Not documented    Pneumococcal vaccine: Pneumovax  (10/27/2008)    H. zoster vaccine: Not documented  Colorectal Screening   Hemoccult: Not documented    Colonoscopy: Not documented  Other Screening   PSA: Not documented   Smoking status: current  (03/22/2010)   Smoking cessation counseling: yes  (03/22/2010)  Lipids   Total Cholesterol: 196  (02/14/2010)   LDL: 96  (02/14/2010)   LDL Direct: Not documented   HDL: 29  (02/14/2010)   Triglycerides: 354  (02/14/2010)

## 2010-06-19 NOTE — Assessment & Plan Note (Signed)
  Nurse Visit   Allergies: 1)  ! * Unknown Pain Medication Patient had cryptoantigen drawn while here for research labs.Deirdre Evener RN  February 15, 2010 2:28 PM

## 2010-06-19 NOTE — Miscellaneous (Signed)
Summary: HIV-1 RNA, CD4 (RESEARCH)  Clinical Lists Changes  Observations: Added new observation of CD4 COUNT: 130 microliters (05/08/2009 9:15) Added new observation of HIV1RNA QA: 39 copies/mL (05/08/2009 9:15)

## 2010-06-19 NOTE — Assessment & Plan Note (Signed)
Summary: STUDY APPT/ LH   Preventive Screening-Counseling & Management  Alcohol-Tobacco     Smoking Status: current     Packs/Day: 1.0   Current Allergies: ! * UNKNOWN PAIN MEDICATION Vital Signs:  Patient profile:   65 year old male Height:      66.5 inches (168.91 cm) Weight:      120.8 pounds (54.91 kg) BMI:     19.27 Temp:     97.4 degrees F oral Pulse rate:   60 / minute Resp:     16 per minute BP sitting:   108 / 75  (right arm) Is Patient Diabetic? No Pain Assessment Patient in pain? yes     Location: lower back Onset of pain  chronic lower back pain and stiffness Nutritional Status BMI of 19 -24 = normal  Does patient need assistance? Functional Status Self care Ambulation Normal  Patient here for week 48 study visit. He continues to have chronic lower back pain, stiffness and severe numbness in both feet. Lips are dry, but most skin issues have resolved. Adherence has been very good, with no missed doses. Will continue to monitor his renal function.Deirdre Evener RN  October 23, 2009 10:21 AM   Other Orders: Est. Patient Research Study [phone]) T-Basic Metabolic Panel [id number]) T-Hepatic Function [id number]) T-Lipid Profile [id number]) T-Phosphorus [id number]) T-Urine Protein [id number]) T-Urine Creatinine [phone]) T-Urinalysis ([id number])  Process Orders Check Orders Results:     [facility]: ABN not required for this insurance Tests Sent for requisitioning (October 23, 2009 10:06 AM):     10/23/2009: [facility] -- T-Basic Metabolic Panel [phone] (signed)     10/23/2009: [facility] -- T-Hepatic Function [phone] (signed)     10/23/2009: [facility] -- T-Lipid Profile [phone] (signed)     10/23/2009: [facility] -- T-Phosphorus [[id number]] (signed)     10/23/2009: [facility] -- T-Urine Protein [phone] (signed)  10/23/2009: [facility] -- T-Urine Creatinine [[id number]] (signed)     10/23/2009: [facility] -- T-Urinalysis [[id number]] (signed)

## 2010-06-19 NOTE — Progress Notes (Signed)
Summary: New scripts--[facility]  Phone Note From Pharmacy   Caller: [facility] Montel Culver. Reason for Call: Needs renewal Summary of Call: Received a message from new [facility] pharmacy requeting new prescriptions for Truvada, Norvir, Fluconazole, and Bactrim. These were not transfered from [facility]. Initial call taken by: Paulo Fruit  BS,CPht II,MPH,  November 28, 2009 1:33 PM    Prescriptions: SEPTRA DS 800-160 MG TABS (SULFAMETHOXAZOLE-TRIMETHOPRIM) Take 1 tablet by every Monday, Wednesday and Friday  #30 x 10   Entered by:   Paulo Fruit  BS,CPht II,MPH   Authorized by:   Cliffton Asters MD   Signed by:   Paulo Fruit  BS,CPht II,MPH on 11/28/2009   Method used:   Electronically to        [facility] [phone]* (retail)       [address]       [place], [place]  [address]       Ph: [phone]       Fax:    RxID:   [id number] DIFLUCAN 150 MG TABS (FLUCONAZOLE) Take 3 tablets by mouth once a day  #90 x 10   Entered by:   Paulo Fruit  BS,CPht II,MPH   Authorized by:   Cliffton Asters MD   Signed by:   Paulo Fruit  BS,CPht II,MPH on 11/28/2009   Method used:   Electronically to        [facility] [phone]* (retail)       [address]       [place], [place]  [address]       Ph: [phone]       Fax:    RxID:   [id number] NORVIR 100 MG TABS (RITONAVIR) 1 by mouth daily  #30 x 10   Entered by:   Paulo Fruit  BS,CPht II,MPH   Authorized by:   Cliffton Asters MD   Signed by:   Paulo Fruit  BS,CPht II,MPH on 11/28/2009   Method used:   Electronically to        [facility] [phone]* (retail)       [address]       [place], [place]  [address]       Ph: [phone]       Fax:    RxID:   [id number]  Paulo Fruit  BS,CPht II,MPH  November 28, 2009 1:34 PM

## 2010-06-19 NOTE — Assessment & Plan Note (Signed)
Summary: STUDY APPT/ LH    Current Allergies: ! * UNKNOWN PAIN MEDICATION Vital Signs:  Patient profile:   65 year old male Weight:      125 pounds (56.82 kg) BMI:     19.95 Temp:     97.0 degrees F oral Pulse rate:   68 / minute Resp:     18 per minute BP sitting:   103 / 78  (right arm) Is Patient Diabetic? No Pain Assessment Patient in pain? yes     Location: lower back Intensity: 4 Type: aching Onset of pain  movement Nutritional Status BMI of 19 -24 = normal  Does patient need assistance? Functional Status Self care Ambulation Normal   Patient here for week 36 study visit. He has recently got over a cold with cough and nasal congestion symptoms. He c/o moderate stiffness, aching in his lower back. He says it has been like this ever since he fell in January on some ice. He also has generalized muscle aches and has recently started taking osteobiflex. There are red, scaly patches on his face and feet. His left thigh has a circular scaly patch on that as well that has been there for a while. Lips continue to be very dry and chapped. He continues to take his truvada every other day and we will assess his renal function with today's labs.Deirdre Evener RN  July 31, 2009 10:19 AM   Other Orders: Est. Patient Research Study [id number]) T-Basic Metabolic Panel [id number]) T-Hepatic Function [id number]) T-Lipid Profile [id number]) T-Phosphorus [id number]) Process Orders Check Orders Results:     [facility]: ABN not required for this insurance Tests Sent for requisitioning (July 31, 2009 9:46 AM):     07/31/2009: [facility] -- T-Basic Metabolic Panel [phone] (signed)     07/31/2009: [facility] -- T-Hepatic Function [phone] (signed)     07/31/2009: [facility] -- T-Lipid Profile [phone] (signed)     07/31/2009: [facility] -- T-Phosphorus [phone] (signed)

## 2010-06-19 NOTE — Assessment & Plan Note (Signed)
Summary: STUDY APPT/ LH    Current Allergies: ! * UNKNOWN PAIN MEDICATION Vital Signs:  Patient profile:   65 year old male Weight:      119.8 pounds (54.45 kg) Temp:     97.5 degrees F oral Pulse rate:   50 / minute Resp:     16 per minute BP sitting:   106 / 84  Serial Vital Signs/Assessments:  Time      Position  BP       Pulse  Resp  Temp     By 0845                106/84                         Deirdre Evener RN [phone]                112/80                         Deirdre Evener RN   Patient here for week 64 study visit. He is doing well except for having to work hard taking care of his mother. He still has alot of joint pain and stiffness. Lips remain dry. He does say that he is not as fatigued as he was. Continues to be very adherent. He will return in January for followup.Deirdre Evener RN  February 14, 2010 9:23 AM      Other Orders: Est. Patient Research Study [id number]) T-Basic Metabolic Panel [phone]) T-Hepatic Function [id number]) T-Lipid Profile [phone]) T-Phosphorus [phone])

## 2010-06-19 NOTE — Assessment & Plan Note (Signed)
Summary: RESFROM 1/17   CC:  follow-up visit, left shoulder is stiff, unable to raise higher than 3/4 of the way to shoulder, and Depression.  History of Present Illness: [name] [name] is in for his routine visit. He continues to have some stiffness and discomfort in his muscles and joints when he first gets up but overall he is feeling much better.  His appetite is better.  He has not had any fever, headache, cough, shortness of breath or diarrhea.  His Truvada was changed to every other day due to a low creatinine clearance.  He is not had any problem tolerating his medications and appears to be taking them correctly.  He denies missing any doses.  Depression History:      The patient denies a depressed mood most of the day and a diminished interest in his usual daily activities.        Preventive Screening-Counseling & Management  Alcohol-Tobacco     Alcohol drinks/day: <1     Smoking Status: current     Smoking Cessation Counseling: yes     Smoke Cessation Stage: precontemplative     Packs/Day: 1.0     Year Started: 1966  Caffeine-Diet-Exercise     Caffeine use/day: yes     Does Patient Exercise: yes     Type of exercise: weights, and walking     Exercise (avg: min/session): 30-60     Times/week: 6  Hep-HIV-STD-Contraception     HIV Risk: no risk noted  Safety-Violence-Falls     Seat Belt Use: yes  Comments: declined condoms      Sexual History:  n/a.        Drug Use:  never.     Updated Prior Medication List: TRUVADA 200-300 MG TABS (EMTRICITABINE-TENOFOVIR) Take one tablet by mouth every other day REYATAZ 300 MG CAPS (ATAZANAVIR SULFATE) 1 by mouth daily with ritonavir and food NORVIR 100 MG TABS (RITONAVIR) 1 by mouth daily DIFLUCAN 150 MG TABS (FLUCONAZOLE) Take 3 tablets by mouth once a day SEPTRA DS 800-160 MG TABS (SULFAMETHOXAZOLE-TRIMETHOPRIM) Take 1 tablet by mouth daily ZITHROMAX 600 MG TABS (AZITHROMYCIN) Take2 tablets by mouth once a week PROCHLORPERAZINE  MALEATE 10 MG TABS (PROCHLORPERAZINE MALEATE) 1 by mouth q 6h prn VALTREX 1 GM TABS (VALACYCLOVIR HCL) 1 by mouth three times a day for 7days  Current Allergies (reviewed today): ! * UNKNOWN PAIN MEDICATION Vital Signs:  Patient profile:   65 year old male Height:      66.5 inches (168.91 cm) Weight:      122.7 pounds (55.77 kg) BMI:     19.58 Temp:     97.5 degrees F (36.39 degrees C) oral Pulse rate:   88 / minute BP sitting:   110 / 68  (left arm) Cuff size:   regular  Vitals Entered By: Jennet Maduro RN (June 14, 2009 3:31 PM) CC: follow-up visit, left shoulder is stiff, unable to raise higher than 3/4 of the way to shoulder, Depression Is Patient Diabetic? No Pain Assessment Patient in pain? no      Nutritional Status BMI of 19 -24 = normal Nutritional Status Detail appetite "pretty good, still drinking Ensure 1-2 a day "  Have you ever been in a relationship where you felt threatened, hurt or afraid?No   Does patient need assistance? Functional Status Self care Ambulation Normal Comments no missed doses of rxes   Physical Exam  General:  alert and underweight appearing.   Mouth:  new, full dentures.  He  does have nonspecific cracking of his upper and lower lips. pharynx pink and moist, no erythema, and no exudates.  he has a soft raised lesion at the right corner of his mouth which is unchanged. Neck:  supple.   Lungs:  normal breath sounds, no crackles, and no wheezes.   Heart:  normal rate, regular rhythm, and no murmur.   Abdomen:  soft, non-tender, and normal bowel sounds.   Skin:  no rashes.   Psych:  Oriented X3, normally interactive, good eye contact, not anxious appearing, and not depressed appearing.      Impression & Recommendations:  Problem # 1:  HIV INFECTION (ICD-042) [place]'s HIV infection it is suppressed on his current regimen.  However he has had very slow CD4 reconstitution.  I will decrease his Septra to every Monday Wednesday Friday in  case it is causing some bone marrow suppression.  With a CD4 count over 100 it is okay to stop Zithromax now.  I will check a cryptococcal antigen with his next set of blood work and consider stopping Diflucan soon. The following medications were removed from the medication list:    Zithromax 600 Mg Tabs (Azithromycin) .Marland Kitchen... Take2 tablets by mouth once a week His updated medication list for this problem includes:    Diflucan 150 Mg Tabs (Fluconazole) .Marland Kitchen... Take 3 tablets by mouth once a day    Septra Ds 800-160 Mg Tabs (Sulfamethoxazole-trimethoprim) .Marland Kitchen... Take 1 tablet by every monday, wednesday and friday    Valtrex 1 Gm Tabs (Valacyclovir hcl) .Marland Kitchen... 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 Patient Level IV (14782)  Medications Added to Medication List This Visit: 1)  Truvada 200-300 Mg Tabs (Emtricitabine-tenofovir) .... Take one tablet by mouth every other day 2)  Septra Ds 800-160 Mg Tabs (Sulfamethoxazole-trimethoprim) .... Take 1 tablet by every monday, wednesday and friday  Other Orders: Hepatitis B Vaccine >37yrs [id number]) Admin 1st Vaccine (30865) Admin 1st Vaccine [facility]) [phone])  Patient Instructions: 1)  Please schedule a follow-up appointment in 3 months.        Hepatitis B Vaccine # 3    Vaccine Type: HepB Adult    Site: right deltoid    Mfr: Merck    Dose: 0.5 ml    Route: IM    Given by: Jennet Maduro RN    Exp. Date: 02/25/2011    Lot #: [id number]

## 2010-06-19 NOTE — Miscellaneous (Signed)
Summary: HIV-1 RNA, CD4 (RESEARCH)  Clinical Lists Changes  Observations: Added new observation of CD4 COUNT: 182 microliters (10/23/2009 16:07) Added new observation of HIV1RNA QA: 39 copies/mL (10/23/2009 16:07)

## 2010-06-19 NOTE — Assessment & Plan Note (Signed)
Summary: FLU SHOT  Prior Medications: TRUVADA 200-300 MG TABS (EMTRICITABINE-TENOFOVIR) Take one tablet by mouth every other day REYATAZ 300 MG CAPS (ATAZANAVIR SULFATE) 1 by mouth daily with ritonavir and food NORVIR 100 MG TABS (RITONAVIR) 1 by mouth daily DIFLUCAN 150 MG TABS (FLUCONAZOLE) Take 3 tablets by mouth once a day SEPTRA DS 800-160 MG TABS (SULFAMETHOXAZOLE-TRIMETHOPRIM) Take 1 tablet by every Monday, Wednesday and Friday PROCHLORPERAZINE MALEATE 10 MG TABS (PROCHLORPERAZINE MALEATE) 1 by mouth q 6h prn VALTREX 1 GM TABS (VALACYCLOVIR HCL) 1 by mouth three times a day for 7days CENTRUM SILVER  TABS (MULTIPLE VITAMINS-MINERALS) Take 1 tablet by mouth once a day VITAMIN D 400 UNIT TABS (CHOLECALCIFEROL) Take 3 tablets by mouth once a day Current Allergies: ! * UNKNOWN PAIN MEDICATION Immunizations Administered:  Influenza Vaccine # 1:    Vaccine Type: Fluvax Non-MCR    Site: right deltoid    Mfr: Novartis    Dose: 0.5 ml    Route: IM    Given by: Deirdre Evener RN    Exp. Date: 09/02/2010    Lot #: [id number]    VIS given: 12/12/09 version given February 14, 2010.  Orders Added: 1)  Influenza Vaccine NON MCR [00028]

## 2010-06-19 NOTE — Progress Notes (Signed)
Summary: [facility]/pt assist meds arrived for Feb  Phone Note Refill Request      Prescriptions: SEPTRA DS 800-160 MG TABS (SULFAMETHOXAZOLE-TRIMETHOPRIM) Take 1 tablet by every Monday, Wednesday and Friday  #12 x 0   Entered by:   Paulo Fruit  BS,CPht II,MPH   Authorized by:   Cliffton Asters MD   Signed by:   Paulo Fruit  BS,CPht II,MPH on 06/20/2009   Method used:   Samples Given   RxID:   [id number] DIFLUCAN 150 MG TABS (FLUCONAZOLE) Take 3 tablets by mouth once a day  #90 x 0   Entered by:   Paulo Fruit  BS,CPht II,MPH   Authorized by:   Cliffton Asters MD   Signed by:   Paulo Fruit  BS,CPht II,MPH on 06/20/2009   Method used:   Samples Given   RxID:   [id number] NORVIR 100 MG TABS (RITONAVIR) 1 by mouth daily  #30 x 0   Entered by:   Paulo Fruit  BS,CPht II,MPH   Authorized by:   Cliffton Asters MD   Signed by:   Paulo Fruit  BS,CPht II,MPH on 06/20/2009   Method used:   Samples Given   RxID:   [id number]  Patient Assist Medication Verification: Medication name: SMZ-TMP DS 800/160mg  RX # [id number] Tech approval:MLD   Patient Assist Medication Verification: Medication:Norvir 100mg  QVZ#[id number] Exp Date:20 Sep 2010 Tech approval:MLD                Patient Assist Medication Verification: Medication:Fluconazole 150mg  Lot# [id number] Exp Date:08 2012 Tech approval:MLD Call placed to patient with message that assistance medications are ready for pick-up. Paulo Fruit  BS,CPht II,MPH  June 20, 2009 4:15 PM

## 2010-06-19 NOTE — Miscellaneous (Signed)
Summary: clinical update/ryan white  Clinical Lists Changes  Observations: Added new observation of YEARLYEXPEN: 3250  (11/29/2009 13:53) Added new observation of PCTFPL: 182.94  (11/29/2009 13:53) Added new observation of HOUSEINCOME: 16109  (11/29/2009 13:53) Added new observation of FINASSESSDT: 10/27/2009  (11/29/2009 13:53)

## 2010-06-19 NOTE — Assessment & Plan Note (Signed)
Summary: STUDY [MKJ]    Current Allergies: ! * UNKNOWN PAIN MEDICATION Vital Signs:  Patient profile:   65 year old male Weight:      125.25 pounds (56.93 kg)  Patient here to have creatinine rechecked for truvada dosing. No new problems noted.Deirdre Evener RN  April 02, 2010 2:20 PM    Other Orders: Est. Patient Research Study [phone]) T-Basic Metabolic Panel [phone])

## 2010-06-19 NOTE — Miscellaneous (Signed)
Summary: HIV-1 RNA, CD4 (RESEARCH)  Clinical Lists Changes  Observations: Added new observation of CD4 COUNT: 176 microliters (02/14/2010 14:16) Added new observation of HIV1RNA QA: 39 copies/mL (02/14/2010 14:16)

## 2010-06-19 NOTE — Progress Notes (Signed)
Summary: [facility]/pt assist meds arrived for Mar  Phone Note Refill Request      Prescriptions: SEPTRA DS 800-160 MG TABS (SULFAMETHOXAZOLE-TRIMETHOPRIM) Take 1 tablet by every Monday, Wednesday and Friday  #12 x 0   Entered by:   Paulo Fruit  BS,CPht II,MPH   Authorized by:   Cliffton Asters MD   Signed by:   Paulo Fruit  BS,CPht II,MPH on 07/26/2009   Method used:   Samples Given   RxID:   [id number] DIFLUCAN 150 MG TABS (FLUCONAZOLE) Take 3 tablets by mouth once a day  #90 x 0   Entered by:   Paulo Fruit  BS,CPht II,MPH   Authorized by:   Cliffton Asters MD   Signed by:   Paulo Fruit  BS,CPht II,MPH on 07/26/2009   Method used:   Samples Given   RxID:   [id number] NORVIR 100 MG TABS (RITONAVIR) 1 by mouth daily  #30 x 0   Entered by:   Paulo Fruit  BS,CPht II,MPH   Authorized by:   Cliffton Asters MD   Signed by:   Paulo Fruit  BS,CPht II,MPH on 07/26/2009   Method used:   Samples Given   RxID:   [id number]  Patient Assist Medication Verification: Medication name: SMZ-TMP DS 800/160mg  RX # [id number] Tech approval:MLD   Patient Assist Medication Verification: Medication: Norvir 100mg  DGU#[id number] Exp Date:20 Sep 2010 Tech approval:MLD                Patient Assist Medication Verification: Medication:Fluconazole 150mg  QQV#[id number] Exp Date:12/2010 Tech approval:MLD               Tried to contact patient, but number on file has been disconnected Paulo Fruit  BS,CPht II,MPH  July 26, 2009 3:31 PM

## 2010-06-21 NOTE — Miscellaneous (Signed)
  Clinical Lists Changes 

## 2010-06-21 NOTE — Assessment & Plan Note (Signed)
Summary: STUDY APPT/ LH   Preventive Screening-Counseling & Management  Alcohol-Tobacco     Smoking Status: current     Packs/Day: 1.0   Current Allergies: ! * UNKNOWN PAIN MEDICATION Vital Signs:  Patient profile:   65 year old male Weight:      126 pounds (57.27 kg) BMI:     20.10 Temp:     98.2 degrees F oral Pulse rate:   72 / minute Resp:     16 per minute BP sitting:   114 / 79  (right arm) Is Patient Diabetic? No   Patient here for week 80 study visit. He has noticeable growths on his right lip, that look like HPV warts that are bothering him and he wants them removed. He also expresses an increase in depression related to loneliness. He has met with Serina Cowper and is trying to develop some relationships with other positive people. He also states that his penis is bent forward which occurred suddenly about 2 months ago. This probably bothers him more than anything else. I instructed him to make an appt to see Dr. Orvan Falconer soon for a referral for urology and ? dermatology. Deirdre Evener RN  Other Orders: Est. Patient Research Study [phone]) T-Basic Metabolic Panel [id number]) T-Lipid Profile [phone]) T-Phosphorus [phone]) T-Hepatic Function [phone])

## 2010-06-21 NOTE — Miscellaneous (Signed)
Summary: Norvir app 11/23/08  Clinical Lists Changes old app thru [facility] for norvir dated 11/23/08. he is still on the drug so must have re applied or is getting thru another source.Golden Circle RN  March 07, 2010 2:24 PM refilled by another. good thru  10/2010.Golden Circle RN  June 14, 2010 9:15 AM

## 2010-06-27 NOTE — Miscellaneous (Signed)
Summary: HIV-1 RNA, CD4 (RESEARCH)  Clinical Lists Changes  Observations: Added new observation of CD4 COUNT: 202 microliters (06/05/2010 14:49) Added new observation of HIV1RNA QA: 39 copies/mL (06/05/2010 14:49)

## 2010-07-02 ENCOUNTER — Encounter (INDEPENDENT_AMBULATORY_CARE_PROVIDER_SITE_OTHER): Payer: Self-pay | Admitting: *Deleted

## 2010-07-04 ENCOUNTER — Ambulatory Visit (INDEPENDENT_AMBULATORY_CARE_PROVIDER_SITE_OTHER): Payer: Self-pay | Admitting: Internal Medicine

## 2010-07-04 ENCOUNTER — Encounter (INDEPENDENT_AMBULATORY_CARE_PROVIDER_SITE_OTHER): Payer: Self-pay | Admitting: *Deleted

## 2010-07-04 ENCOUNTER — Encounter: Payer: Self-pay | Admitting: Internal Medicine

## 2010-07-04 DIAGNOSIS — N486 Induration penis plastica: Secondary | ICD-10-CM

## 2010-07-04 DIAGNOSIS — K1370 Unspecified lesions of oral mucosa: Secondary | ICD-10-CM

## 2010-07-04 DIAGNOSIS — B20 Human immunodeficiency virus [HIV] disease: Secondary | ICD-10-CM

## 2010-07-04 DIAGNOSIS — F17200 Nicotine dependence, unspecified, uncomplicated: Secondary | ICD-10-CM

## 2010-07-11 NOTE — Miscellaneous (Signed)
  Clinical Lists Changes 

## 2010-07-11 NOTE — Assessment & Plan Note (Signed)
Summary: pt. has issue   Vital Signs:  Patient profile:   65 year old male Height:      66.5 inches (168.91 cm) Weight:      123.75 pounds (56.25 kg) BMI:     19.75 Temp:     97.5 degrees F (36.39 degrees C) oral Pulse rate:   83 / minute BP sitting:   123 / 79  (left arm) Cuff size:   regular  Vitals Entered By: Jennet Maduro RN (July 04, 2010 9:22 AM) CC: Place on inside on the right upper lip, penis has changed shape and gotten smaller Is Patient Diabetic? No Pain Assessment Patient in pain? no      Nutritional Status BMI of 19 -24 = normal Nutritional Status Detail appetite "good"  Have you ever been in a relationship where you felt threatened, hurt or afraid?No   Does patient need assistance? Functional Status Self care Ambulation Normal Comments no missed doses of rxes   CC:  Place on inside on the right upper lip and penis has changed shape and gotten smaller.  History of Present Illness: [name] [name] is in for his routine visit.  He has not missed any of his medications.  He is bothered by the enlarging lesion at the right corner of his mouth.  He is also recently noticed some curvature of his penis and some aching pain when he has an erection.  He is still smoking cigarettes but is requesting a prescription for Wellbutrin.  He took that several years ago and it helped him quit smoking transiently.  He is not sexually active.  Preventive Screening-Counseling & Management  Alcohol-Tobacco     Alcohol drinks/day: <1     Smoking Status: current     Smoking Cessation Counseling: yes     Smoke Cessation Stage: ready     Packs/Day: 1.0     Year Started: 1968  Caffeine-Diet-Exercise     Caffeine use/day: yes     Does Patient Exercise: yes     Type of exercise: weights, and walking     Exercise (avg: min/session): 30-60     Times/week: 6  Comments: would like to try some medications, would like a chest x-ray, brother recently diagosed with lung  cancer  Hep-HIV-STD-Contraception     HIV Risk: no risk noted     HIV Risk Counseling: not indicated-no HIV risk noted     Dental Visit-last 6 months yes, going to get his dentures 'realigned  Safety-Violence-Falls     Seat Belt Use: yes  Comments: declined condoms      Sexual History:  n/a.        Drug Use:  never.    Current Medications (verified): 1)  Truvada 200-300 Mg Tabs (Emtricitabine-Tenofovir) .... Take One Tablet By Mouth Every Other Day 2)  Reyataz 300 Mg Caps (Atazanavir Sulfate) .Marland Kitchen.. 1 By Mouth Daily With Ritonavir and Food 3)  Norvir 100 Mg Tabs (Ritonavir) .Marland Kitchen.. 1 By Mouth Daily 4)  Diflucan 150 Mg Tabs (Fluconazole) .... Take 3 Tablets By Mouth Once A Day 5)  Septra Ds 800-160 Mg Tabs (Sulfamethoxazole-Trimethoprim) .... Take 1 Tablet By Every Monday, Wednesday and Friday 6)  Centrum Silver  Tabs (Multiple Vitamins-Minerals) .... Take 1 Tablet By Mouth Once A Day 7)  Vitamin D 400 Unit Tabs (Cholecalciferol) .... Take 3 Tablets By Mouth Once A Day  Allergies: 1)  ! * Unknown Pain Medication  Physical Exam  General:  alert and well-nourished.   Mouth:  he  has a small growth on the right corner of his mouth where he had previous perleche.  It has a benign appearance. the growth has enlarged and he now has several other warty lesions on the buccal mucosa anteriorly. he continues to have cracking of his lip strictly his lower lip. Lungs:  normal breath sounds, no crackles, and no wheezes.   Heart:  normal rate, regular rhythm, and no murmur.   Skin:  no rashes.   Axillary Nodes:  no R axillary adenopathy and no L axillary adenopathy.   Psych:  Oriented X3, normally interactive, good eye contact, not anxious appearing, and not depressed appearing.          Medication Adherence: 07/04/2010   Adherence to medications reviewed with patient. Counseling to provide adequate adherence provided   Prevention For Positives: 07/04/2010   Safe sex practices discussed with  patient. Condoms offered.                             Impression & Recommendations:  Problem # 1:  HIV INFECTION (ICD-042) His infection is improving.  I will continue his current regimen. The following medications were removed from the medication list:    Valtrex 1 Gm Tabs (Valacyclovir hcl) .Marland Kitchen... 1 by mouth three times a day for 7days His updated medication list for this problem includes:    Diflucan 150 Mg Tabs (Fluconazole) .Marland Kitchen... Take 3 tablets by mouth once a day    Septra Ds 800-160 Mg Tabs (Sulfamethoxazole-trimethoprim) .Marland Kitchen... Take 1 tablet by every monday, wednesday and friday  Diagnostics Reviewed:  HIV: CDC-defined AIDS (10/27/2008)   CD4: 202 (06/05/2010)   WBC: 4.9 (01/16/2009)   Hgb: 14.9 (01/16/2009)   HCT: 43.6 (01/16/2009)   Platelets: 186 (01/16/2009) HIV genotype: See Comment (10/27/2008)   HIV-1 RNA: 39 (06/05/2010)   HBSAg: negative (10/19/2008)  Orders: Est. Patient Level IV (99214)Future Orders: T-CD4SP ([facility]) (CD4SP) ... 10/02/2010 T-HIV Viral Load [phone]) ... 10/02/2010 T-RPR (Syphilis) [phone]) ... 10/02/2010  Problem # 2:  OTHER&UNSPECIFIED DISEASES THE ORAL SOFT TISSUES (ICD-528.9) I will try to arrange a dental or oral surgery referral.  I believe he needs excisional biopsy of these lesions. Orders: Est. Patient Level IV (29562) Dental Referral (Dentist)  Problem # 3:  PEYRONIES DISEASE (ICD-607.85) His history is compatible with Peyronie's disease. Unfortunately, there is little evidence based data to suggest that any specific intervention will help at this point. I will have him follow up in 3 months and consider urology referral at that time. Orders: Est. Patient Level IV (13086)  Problem # 4:  CIGARETTE SMOKER (ICD-305.1) I have given him written information about cigarette cessation, a copy of the [place] toll-free quit line, and a prescription for Wellbutrin. Orders: Est. Patient Level IV (57846)  Medications  Added to Medication List This Visit: 1)  Wellbutrin 75 Mg Tabs (Bupropion hcl) .... Take 2 tablets by mouth two times a day  Patient Instructions: 1)  Please schedule a follow-up appointment in 3 months.  Prescriptions: WELLBUTRIN 75 MG TABS (BUPROPION HCL) Take 2 tablets by mouth two times a day  #120 x 4   Entered and Authorized by:   Cliffton Asters MD   Signed by:   Cliffton Asters MD on 07/04/2010   Method used:   Print then Give to Patient   RxID:   [id number]    Orders Added: 1)  T-CD4SP ([facility]) [CD4SP] 2)  T-HIV Viral Load [phone]  3)  T-RPR (Syphilis) [[id number]] 4)  Est. Patient Level IV [91478] 5)  Dental Referral [Dentist]

## 2010-07-11 NOTE — Miscellaneous (Signed)
Summary: ADAP approved through 02/17/11  Clinical Lists Changes  Observations: Added new observation of AIDSDAP: Yes 2012 (07/02/2010 10:53)

## 2010-08-27 LAB — BASIC METABOLIC PANEL
BUN: 4 mg/dL — ABNORMAL LOW (ref 6–23)
CO2: 25 mEq/L (ref 19–32)
CO2: 28 mEq/L (ref 19–32)
Calcium: 8 mg/dL — ABNORMAL LOW (ref 8.4–10.5)
Chloride: 101 mEq/L (ref 96–112)
Chloride: 104 mEq/L (ref 96–112)
GFR calc Af Amer: >60 mL/min (ref >60)
GFR calc non Af Amer: >60 mL/min (ref >60)
Glucose, Bld: 72 mg/dL (ref 70–99)
Potassium: 3.3 mEq/L — ABNORMAL LOW (ref 3.5–5.1)

## 2010-08-27 LAB — CBC
HCT: 34.3 % — ABNORMAL LOW (ref 39.0–52.0)
HCT: 37.8 % — ABNORMAL LOW (ref 39.0–52.0)
Hemoglobin: 11.7 g/dL — ABNORMAL LOW (ref 13.0–17.0)
MCHC: 34 g/dL (ref 30.0–36.0)
MCV: 92.6 fL (ref 78.0–100.0)
Platelets: 111 10*3/uL — ABNORMAL LOW (ref 150–400)
RBC: 3.69 MIL/uL — ABNORMAL LOW (ref 4.22–5.81)
RBC: 4.08 MIL/uL — ABNORMAL LOW (ref 4.22–5.81)
RDW: 12.8 % (ref 11.5–15.5)
RDW: 12.9 % (ref 11.5–15.5)
WBC: 2.7 10*3/uL — ABNORMAL LOW (ref 4.0–10.5)
WBC: 3.2 10*3/uL — ABNORMAL LOW (ref 4.0–10.5)
WBC: 4.2 10*3/uL (ref 4.0–10.5)

## 2010-08-27 LAB — T-HELPER CELLS (CD4) COUNT (NOT AT ARMC): CD4 T Cell Abs: 60 uL — ABNORMAL LOW (ref 400–2700)

## 2010-08-27 LAB — COMPREHENSIVE METABOLIC PANEL
ALT: 34 U/L (ref 0–53)
AST: 27 U/L (ref 0–37)
Albumin: 3.2 g/dL — ABNORMAL LOW (ref 3.5–5.2)
Alkaline Phosphatase: 58 U/L (ref 39–117)
BUN: 13 mg/dL (ref 6–23)
GFR calc Af Amer: >60 mL/min (ref >60)
Potassium: 3.6 mEq/L (ref 3.5–5.1)
Sodium: 133 mEq/L — ABNORMAL LOW (ref 135–145)
Total Protein: 7.1 g/dL (ref 6.0–8.3)

## 2010-08-27 LAB — URINALYSIS, ROUTINE W REFLEX MICROSCOPIC
Glucose, UA: NEGATIVE mg/dL
Hgb urine dipstick: NEGATIVE
Protein, ur: NEGATIVE mg/dL

## 2010-08-27 LAB — RPR: RPR Ser Ql: NONREACTIVE

## 2010-08-27 LAB — HEPATITIS PANEL, ACUTE: HCV Ab: NEGATIVE

## 2010-08-27 LAB — HIV-1 RNA QUANT-NO REFLEX-BLD: HIV 1 RNA Quant: 39100 copies/mL — ABNORMAL HIGH (ref <48)

## 2010-08-27 LAB — RAPID URINE DRUG SCREEN, HOSP PERFORMED
Amphetamines: NOT DETECTED
Barbiturates: NOT DETECTED
Benzodiazepines: POSITIVE — AB
Opiates: POSITIVE — AB
Tetrahydrocannabinol: NOT DETECTED

## 2010-08-27 LAB — DIFFERENTIAL
Basophils Absolute: 0 10*3/uL (ref 0.0–0.1)
Basophils Relative: 0 % (ref 0–1)
Eosinophils Relative: 1 % (ref 0–5)
Eosinophils Relative: 3 % (ref 0–5)
Lymphocytes Relative: 29 % (ref 12–46)
Lymphs Abs: 0.8 10*3/uL (ref 0.7–4.0)
Monocytes Absolute: 0.4 10*3/uL (ref 0.1–1.0)
Monocytes Relative: 11 % (ref 3–12)
Monocytes Relative: 15 % — ABNORMAL HIGH (ref 3–12)
Neutro Abs: 1.4 10*3/uL — ABNORMAL LOW (ref 1.7–7.7)
Neutro Abs: 2.7 10*3/uL (ref 1.7–7.7)

## 2010-08-27 LAB — LIPID PANEL
LDL Cholesterol: 129 mg/dL — ABNORMAL HIGH (ref 0–99)
Triglycerides: 111 mg/dL (ref <150)
VLDL: 22 mg/dL (ref 0–40)

## 2010-08-27 LAB — IRON AND TIBC
Iron: 84 ug/dL (ref 42–135)
Saturation Ratios: 50 % (ref 20–55)
TIBC: 168 ug/dL — ABNORMAL LOW (ref 215–435)

## 2010-08-27 LAB — FERRITIN: Ferritin: 930 ng/mL — ABNORMAL HIGH (ref 22–322)

## 2010-08-27 LAB — PROTIME-INR: Prothrombin Time: 12.9 seconds (ref 11.6–15.2)

## 2010-08-27 LAB — HEMOGLOBIN A1C: Mean Plasma Glucose: 117 mg/dL

## 2010-08-27 LAB — HIV 1/2 CONFIRMATION
HIV-1 antibody: POSITIVE
HIV-2 Ab: NEGATIVE

## 2010-08-27 LAB — TSH: TSH: 1.17 u[IU]/mL (ref 0.350–4.500)

## 2010-08-27 LAB — RETICULOCYTES: Retic Count, Absolute: 11.7 10*3/uL — ABNORMAL LOW (ref 19.0–186.0)

## 2010-08-27 LAB — HIV ANTIBODY (ROUTINE TESTING W REFLEX): HIV: REACTIVE — AB

## 2010-08-28 LAB — COMPREHENSIVE METABOLIC PANEL
BUN: 15 mg/dL (ref 6–23)
CO2: 28 mEq/L (ref 19–32)
Calcium: 9.1 mg/dL (ref 8.4–10.5)
Creatinine, Ser: 0.91 mg/dL (ref 0.4–1.5)
GFR calc non Af Amer: >60 mL/min (ref >60)
Glucose, Bld: 167 mg/dL — ABNORMAL HIGH (ref 70–99)

## 2010-08-28 LAB — SEDIMENTATION RATE: Sed Rate: 24 mm/hr — ABNORMAL HIGH (ref 0–16)

## 2010-08-28 LAB — CBC
Hemoglobin: 16 g/dL (ref 13.0–17.0)
MCHC: 33.4 g/dL (ref 30.0–36.0)
MCV: 93.4 fL (ref 78.0–100.0)
RBC: 5.14 MIL/uL (ref 4.22–5.81)

## 2010-08-28 LAB — PROTIME-INR
INR: 0.9 (ref 0.00–1.49)
Prothrombin Time: 12.6 seconds (ref 11.6–15.2)

## 2010-08-28 LAB — CULTURE, BLOOD (ROUTINE X 2)
Culture: NO GROWTH
Culture: NO GROWTH

## 2010-08-28 LAB — AMMONIA: Ammonia: 43 umol/L — ABNORMAL HIGH (ref 11–35)

## 2010-08-28 LAB — DIFFERENTIAL
Eosinophils Absolute: 0.1 10*3/uL (ref 0.0–0.7)
Lymphocytes Relative: 19 % (ref 12–46)
Lymphs Abs: 1 10*3/uL (ref 0.7–4.0)
Neutro Abs: 3.5 10*3/uL (ref 1.7–7.7)
Neutrophils Relative %: 69 % (ref 43–77)

## 2010-08-28 LAB — ROCKY MTN SPOTTED FVR AB, IGG-BLOOD: RMSF IgG: 1.44 IV — ABNORMAL HIGH

## 2010-09-21 ENCOUNTER — Ambulatory Visit (INDEPENDENT_AMBULATORY_CARE_PROVIDER_SITE_OTHER): Payer: Self-pay | Admitting: *Deleted

## 2010-09-21 ENCOUNTER — Encounter: Payer: Self-pay | Admitting: *Deleted

## 2010-09-21 VITALS — BP 104/80 | HR 62 | Temp 97.3°F | Resp 16 | Ht 66.0 in | Wt 125.0 lb

## 2010-09-21 DIAGNOSIS — B20 Human immunodeficiency virus [HIV] disease: Secondary | ICD-10-CM

## 2010-09-21 LAB — POCT URINALYSIS DIPSTICK
Ketones, UA: NEGATIVE
Protein, UA: 100
Spec Grav, UA: 1.025
pH, UA: 5

## 2010-09-21 LAB — BASIC METABOLIC PANEL
BUN: 18 mg/dL (ref 6–23)
Creat: 1.42 mg/dL (ref 0.40–1.50)
Potassium: 5.5 mEq/L — ABNORMAL HIGH (ref 3.5–5.3)

## 2010-09-21 LAB — HEPATIC FUNCTION PANEL
ALT: 14 U/L (ref 0–53)
AST: 19 U/L (ref 0–37)
Bilirubin, Direct: 0.2 mg/dL (ref 0.0–0.3)
Indirect Bilirubin: 0.8 mg/dL (ref 0.0–0.9)

## 2010-09-21 LAB — LIPID PANEL
Cholesterol: 237 mg/dL — ABNORMAL HIGH (ref 0–200)
Triglycerides: 306 mg/dL — ABNORMAL HIGH (ref <150)
VLDL: 61 mg/dL — ABNORMAL HIGH (ref 0–40)

## 2010-09-21 NOTE — Progress Notes (Signed)
Patient here for week 96 study visit. He denies any new problems currently. He had some warts taken off of his lip by oral surgery, but still has some small ones inside of his lip. He continues to have joint pain and stiffness all over. He has considerable numbness in both feet which has gotten worse the past year. He c/o some depression and loneliness, and trying to get out more. His mother and brother are now living with his niece, so he has been able to get more done around his house. Continues to be bothered with seasonal allergies, nasal drainage, cough, sneezing. He has an appt with Dr. Orvan Falconer the end of this month.

## 2010-09-22 LAB — HEPATITIS B SURFACE ANTIBODY,QUALITATIVE: Hep B S Ab: NEGATIVE

## 2010-09-22 LAB — HEPATITIS B SURFACE ANTIGEN: Hepatitis B Surface Ag: NEGATIVE

## 2010-09-22 LAB — HEPATITIS B CORE ANTIBODY, TOTAL: Hep B Core Total Ab: NEGATIVE

## 2010-09-28 ENCOUNTER — Ambulatory Visit (INDEPENDENT_AMBULATORY_CARE_PROVIDER_SITE_OTHER): Payer: Self-pay | Admitting: *Deleted

## 2010-09-28 DIAGNOSIS — B20 Human immunodeficiency virus [HIV] disease: Secondary | ICD-10-CM

## 2010-09-28 LAB — BASIC METABOLIC PANEL
Calcium: 8.8 mg/dL (ref 8.4–10.5)
Sodium: 137 mEq/L (ref 135–145)

## 2010-09-29 ENCOUNTER — Telehealth: Payer: Self-pay | Admitting: *Deleted

## 2010-09-29 NOTE — Telephone Encounter (Signed)
Repeat creatinine clearance is still low at 44.6. [name] [name] was instructed to go to every other day dosing of his Truvada until we recheck his bmet in a few weeks. He sees Dr. Orvan Falconer on the 29th so will check it then.

## 2010-10-01 ENCOUNTER — Other Ambulatory Visit: Payer: Self-pay | Admitting: *Deleted

## 2010-10-01 DIAGNOSIS — B20 Human immunodeficiency virus [HIV] disease: Secondary | ICD-10-CM

## 2010-10-01 MED ORDER — RITONAVIR 100 MG PO CAPS: 100.0000 mg | ORAL_CAPSULE | Freq: Every day | ORAL | Status: DC

## 2010-10-01 MED ORDER — SULFAMETHOXAZOLE-TRIMETHOPRIM 800-160 MG PO TABS: 1.0000 | ORAL_TABLET | ORAL | Status: DC

## 2010-10-01 MED ORDER — FLUCONAZOLE 150 MG PO TABS: 450.0000 mg | ORAL_TABLET | Freq: Every day | ORAL | Status: DC

## 2010-10-01 NOTE — Telephone Encounter (Signed)
Caroline called and said his bactrim had run out without any refills. I called the [facility] on [address] where he gets his ADAP meds and gave them 11 refills on that. They also said his norvir and diflucan were about to expire, so I renewed those refills also for 11 more months.

## 2010-10-02 ENCOUNTER — Other Ambulatory Visit: Payer: Self-pay

## 2010-10-02 NOTE — Discharge Summary (Signed)
NAME:  [name] [name], [name] [name] NO.:  [id number]   MEDICAL RECORD NO.:  [id number]          PATIENT TYPE:  INP   LOCATION:  1513                         FACILITY:  [facility]   PHYSICIAN:  [name] Scott, MD     DATE OF BIRTH:  1945-10-11   DATE OF ADMISSION:  10/17/2008  DATE OF DISCHARGE:  10/21/2008                               DISCHARGE SUMMARY   PRIMARY MEDICAL DOCTOR:  [name] [name].   DISCHARGE DIAGNOSES:  1. Human immunodeficiency virus infection.  2. Cryptococcal meningitis.  3. Encephalopathy secondary to narcotics.  Resolved.  4. Hypokalemia.  5. Pancytopenia.  6. Tobacco abuse.  7. Mild hyponatremia.  8. 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      Outpatient evaluation as deemed necessary.   DISCHARGE MEDICATIONS:  1. Fluconazole 150 mg tablet, 3 tablets p.o. daily.  2. Septra DS 1 p.o. daily.  3. Azithromycin 600 mg tablet, 2 tablets p.o. every Thursday.   DISCONTINUED MEDICATIONS:  Amoxicillin, hydrocodone/acetaminophen.   PROCEDURES:  1. MRI of the head without contrast.  Impression:      a.     No acute intracranial abnormality.      b.     Scattered subcortical T2 hyperintensities and       periventricular white matter disease is greater than expected for       age.  Finding is nonspecific but can be seen in the setting of       chronic microvascular ischemia, a demyelinating process such as       multiple sclerosis, vasculitis, complicated migraine headaches or       as the sequelae for prior infectious or inflammatory process.      c.     Polypoid mass right maxillary sinus and nasal cavity with       associated obstruction.  This may represent a benign polyp.  An       inverted papilloma can have a similar appearance.  At least part       of this lesion should be amenable to direct visualization.  2. Chest x-ray on June 1st.  Impression:  Stable.  No acute      cardiopulmonary abnormality.  3. CT of the head without contrast on  May 31st.  Impression:      a.     No acute intracranial abnormality.      b.     Right maxillary sinus inflammatory changes.  4. Chest x-ray on May 31st.  No acute cardiac or pulmonary process.   PERTINENT LABS:  HIV-1 RNA quantitative 39,100 copies per mL.  HIV-1 RNA  quantitative log 4.59 log 10, magnesium 1.9.  Basic metabolic panel with  sodium 134, potassium 3.3, BUN 4, creatinine 0.76.  CBCs with hemoglobin  12.8, hematocrit 37.8, white blood cell 3.2, platelets 137.  Anemia  panel with absolute reticulocytes 11.7, iron 84, total iron-binding  capacity 168, percentage saturation 50, Vitamin B12 581, serum folate  12.1, ferritin 930.  CD-4 count 60 and CD-4 % is 7.  Cryptococcal  antigen positive with titer of  1:4.  Acute hepatitis panel negative.  Blood cultures are no growth to date x2, RPR is nonreactive.  HIV  antibody reactive.  [facility] spotted fever IgM 0.17 and IgG 1.44.  TSH 1.170, hemoglobin A1c 5.7.  Lipid panel with HDL 28, LDL 129,  ammonia 14.  Hepatic panel only remarkable for albumin of 3.2.  Urine  drug screen positive for benzodiazepines and opiates.  Urinalysis not  suggestive of urinary tract infection.  ESR 24, blood alcohol level less  than 5.   CONSULTATIONS:  Infectious disease, Dr. Cliffton [name].   HOSPITAL COURSE AND PATIENT DISPOSITION:  [name] [name] is a very pleasant  65 year old Caucasian male patient with history of tobacco abuse who has  been having headaches for the past few weeks.  He thought this was  secondary to tooth pain.  He had multiple teeth extracted almost a week  prior to admission.  He was prescribed narcotic pain medications and  antibiotics.  He was now brought to the emergency room by his mom with  history of confusion and altered mental status.  EMS sedated him with  benzodiazepine secondary to agitation.  He also required restraints in  the ED.  He was thereby admitted for further evaluation and management.  1. Advanced HIV  infection.  The patient was admitted to the hospital.      His MRI was not revealing for the cause of his altered mental      status.  However, given his history of chronic headaches meningitis      was suspected.  LP was recommended.  Despite his mental status      being clear, patient on multiple times has declined a lumbar      puncture.  His altered mental status was most likely secondary to      narcotic pain medications.  Once these were held, his mental status      cleared and came back to baseline.  He denied any headache.      However, given his history of chronic headaches and HIV antibody      titer being positive, infectious disease consultation was obtained.      They also spoke to him about the LP which he again declined.  Dr.      Orvan [name] thinks that his encephalopathy was most likely secondary      to narcotic medication since this cleared even before starting      Diflucan for his meningitis.  He thinks that patient, even if he      has, might have smoldering mild Cryptococcal meningitis.  He has      cleared him for discharge on the above regimen for Cryptococcal      meningitis and prophylaxis for MAI and PCP.  He will arrange for      follow-up at the infectious disease clinic next week where he can      also see the financial or medication counselors.  The Bactrim and      fluconazole are available through the [facility] four dollar      prescriptions.  The azithromycin, the patient indicates he will be      able to purchase by himself until follow-up with the infectious      disease clinic.  Patient is stable at this point for discharge to      follow-up with the infectious disease clinic next week.  2. Cryptococcal meningitis.  Management as above.  3. Encephalopathy secondary to narcotic medications.  This has      resolved. Recommend avoiding narcotic use.  4. Hypokalemia.  Has been repleted.  5. Pancytopenia, unclear etiology, question chronic.  Consider       outpatient hematology evaluation as deemed necessary.  6. Tobacco abuse.  Cessation counseled.  The patient declined nicotine      patch.  7. Mild hyponatremia.  Asymptomatic.   Patient at this time is stable for discharge home.   Time taken in coordinating this discharge is 35 minutes.      [name] Scott, MD  Electronically Signed     AH/MEDQ  D:  10/21/2008  T:  10/21/2008  Job:  [id number]   cc:   [name] [name], M.D.  Fax: [phone]

## 2010-10-02 NOTE — H&P (Signed)
NAME:  [name], [name] NO.:  [id number]   MEDICAL RECORD NO.:  [id number]          PATIENT TYPE:  INP   LOCATION:  0101                         FACILITY:  [facility]   PHYSICIAN:  Manus Gunning, MD      DATE OF BIRTH:  1946/04/06   DATE OF ADMISSION:  10/17/2008  DATE OF DISCHARGE:                              HISTORY & PHYSICAL   CHIEF COMPLAINT:  Altered mental status, delirium.   HISTORY OF PRESENT ILLNESS:  Mr. [name] [name] is a 65 year old Caucasian male  who was brought to the emergency department by his mother.  Mother  claims that this morning he has been doing well but then this afternoon  when she went to check on him as he lives at home with her, that the  patient was confused and just sitting in his chair mumbling.  This  prompted her to call EMS at which time he became agitated and they had  to sedate him with benzodiazepines.  Upon arrival to the emergency  department the patient was agitated and required restraints as well as  repeat treatment with Ativan, receiving a total of four doses.   Apparently all history has been obtained from the patient's mother as  well as his niece who is at his bedside.  They claim that the patient  has had headache for five weeks now and approximately 10 days ago had  the left five teeth on his maxillary bone taken out.  Now he is on  hydrocodone 7.5/500 and amoxicillin 500 q.6.  They claim that he has  been doing well and this morning had breakfast and then has been in his  room all day.  Preceding this apart from his headache he denies syncope,  presyncope.  No injuries, no loss of consciousness, seizures.  No falls.  No chest pain, palpitations, PND, orthopnea.  No shortness of breath,  cough, expectoration.  No abdominal pain, nausea, vomiting, diarrhea,  constipation, dysuria, polyuria, hematuria, bright red blood per rectum,  or melanotic stools.  No musculoskeletal complaints preceding this.  In  the emergency department  the patient was found to have an alcohol level  which was less than 5.  A urine drug screen is pending at this time.  His ammonia was mildly elevated at 43 and his total bilirubin was  elevated at 1.8.  A CT scan of his head demonstrated right maxillary  sinus inflammation but was otherwise negative and a chest x-ray  demonstrated no acute cardiopulmonary disease.  He has been afebrile  since presentation without any leukocytosis.  The family claims that he  has not had any sick contacts, no recent travel history either.   PAST MEDICAL/SURGICAL HISTORY:  Tonsillectomy.   SOCIAL HISTORY:  Positive for tobacco, approximately one pack per day.  Alcohol:  His mother claims that he does not drink alcohol, though his  niece in private conversation claims that he does.  In regards to  illicits, again his niece claims that he has smoked marijuana in the  past but his mom claims that he does not but she does  not know and she  suspects that he does not use any illicit drugs.   FAMILY HISTORY:  Mom has an artificial valve replacement which is a  porcine valve, does not know which particular valve.  Father is deceased  secondary to CVAs.   ALLERGIES:  No known drug allergies.   MEDICATIONS:  1. Amoxicillin 500 mg every six hours.  2. Hydrocodone/acetaminophen 7.5/500 mg q.6 h. p.r.n. as needed.   REVIEW OF SYSTEMS:  Essentially a 14-point review of systems performed.  Pertinent positives and negatives described above.   PHYSICAL EXAMINATION:  VITAL SIGNS:  At time of presentation,  temperature 97.4, heart rate 91, respiratory rate 18, blood pressure  117/79, O2 saturation 98% on room air.  GENERAL:  Well-developed, well-nourished, Caucasian male lying in bed,  asleep, moving all four extremities.  No apparent distress.  HEENT:  Normocephalic, atraumatic.  Moist oral mucosa.  No thrush,  erythema, or post nasal drip.  Eyes anicteric.  Extraocular muscles are  intact.  Pupils are equal and  react to light and accommodation.  CARDIOVASCULAR:  S1/S2 normal.  Regular rate and rhythm.  No murmurs,  rubs, or gallops.  RESPIRATORY:  Air entry is bilaterally equal.  No rales, rhonchi, or  wheezes appreciated.  ABDOMEN:  Soft, nontender, nondistended.  Positive bowel sounds. No  organomegaly  EXTREMITIES:  No cyanosis, clubbing, or edema.  Positive bilateral  dorsalis pedis.  CENTRAL NERVOUS SYSTEM:  The patient is somnolent but is arouseable, but  just mumbles to all questions.  Moves all four extremities  independently.  He does not follow any commands.  Withdraws to pain.  Babinski's bilaterally downgoing.  SKIN:  No breakdown, swelling, ulcerations or , or masses.  HEMATOLOGY/ONCOLOGY:  No palpable lymphadenopathy.  No ecchymoses,  bruising, or petechiae.  NECK:  Supple.  Good range of motion.  No thyromegaly or carotid bruits.  Neck veins appear to be within normal limits.   LABORATORY DATA:  Ammonia 43, alcohol level less than 5.  White blood  cell count 5126, hematocrit 48, platelet count 104, polymorphs 69.  INR  0.9, PT 12.6.  Sodium 133, potassium 4.2, chloride 94, , CO2 28, glucose  157, BUN 15, creatinine 0.91.  Total bilirubin 1.8, alkaline phosphatase  63, AST 79, ALT 75, total protein 7.8, albumin 3.5, calcium 9.1.   ASSESSMENT/PLAN:  1. Altered mental status/delirium.  The etiology for this is      uncertain.  The ammonia is mildly elevated, we will start lactulose      every hour until bowel movement, then recheck ammonia in the      morning.  I do not believe that this is the cause but we will      ensure this by initiating lactulose and bringing the ammonia level      down to normal limits.  2. The patient has a mildly elevated total bilirubin.  The cause of      this is uncertain.  The AST, ALT, and alkaline phosphatase were      within normal limits.  3. Mildly elevated ammonia.  We will check hepatitis profile as well      as a HIV test as well.  4.  Delerium-Head Ache.  In light of patients altered mental status, we      will rule out cerebrovascular accident and check an MRI in the      morning as well.  Also urine drug screen will need to be checked as  well and this is pending, apparently has been ordered by the ER.      In regards to possible infectious possibility this is unlikely as      he is afebrile without leukocytosis.  Regardless we will also check      a UA at this time to ensure there is no urinary tract infection.      Chest x-ray will be done post fluid repletion. As he is afebrile      without leukocytosis  I do not feel antibiotics are warranted at      this time.  5. Finally, we will look for another possible cause and most likely      and most possible is that he has taken excessive hydrocodone and      has resulted in his current condition.  At this time, will hold on      narcotics as well as amoxicillin and provide p.r.n. Ativan 1 mg IV      q.2 h. p.r.n. agitation and continue with restraints at this time      as the patient is agitated, pulling out his IVs.   I have had a discussion with his niece who thinks that the mother amd  neice.  Neice suspects that he drinks alcohol has been doing illicits as  well, but again the patient's niece could not quantify what kind of  illicits or amount.   Gastrointestinal and deep vein thrombosis prophylaxis:  Protonix 40 mg  IV daily and heparin 5000 units subcu q.8 hours.      Manus Gunning, MD  Electronically Signed     SP/MEDQ  D:  10/18/2008  T:  10/18/2008  Job:  [id number]

## 2010-10-02 NOTE — Consult Note (Signed)
NAME:  [name] [name], [name] [name] NO.:  [id number]   MEDICAL RECORD NO.:  [id number]          PATIENT TYPE:  INP   LOCATION:  1513                         FACILITY:  [facility]   PHYSICIAN:  Antonietta Breach, M.D.  DATE OF BIRTH:  Sep 15, 1945   DATE OF CONSULTATION:  10/18/2008  DATE OF DISCHARGE:                                 CONSULTATION   REQUESTING PHYSICIAN:  [facility].   REASON FOR CONSULTATION:  Psychosis agitation.   HISTORY OF PRESENT ILLNESS:  [name] [name] is a 65 year old male admitted to  the [facility] on May 31 due to acute mental status changes.   He did have some teeth extracted within the past 2 weeks.  He developed  acute confusion on the day of admission along with thought  disorganization and delusions.  He has continued to display the symptoms  and has required psychotropic tranquilization.   MENTAL STATUS EXAM:  [name] [name] is displaying clear paranoia with  guarding.  He tells the examiner to get out of the room.  His affect is  agitated.  He hides his face with his bed linens.  Judgment is impaired,  insight is poor.  He does not know where he is.  He states that he is at  [employer] place.  He clearly is responding to internal stimuli.   ASSESSMENT:  293.00 Delirium not otherwise specified.   RECOMMENDATIONS:  1. Concur with the delirium workup.  2. Would utilize Abilify starting at 5 mg p.o. or IM daily for anti-      psychosis, anti-agitation.  3. Would utilize Ativan for breakthrough agitation p.r.n.  4. Would provide low stimulation ego support and keep memory and      orientation cues in the room.      Antonietta Breach, M.D.  Electronically Signed     JW/MEDQ  D:  10/18/2008  T:  10/18/2008  Job:  [id number]

## 2010-10-12 ENCOUNTER — Encounter: Payer: Self-pay | Admitting: Internal Medicine

## 2010-10-16 ENCOUNTER — Ambulatory Visit (INDEPENDENT_AMBULATORY_CARE_PROVIDER_SITE_OTHER): Payer: Self-pay | Admitting: Internal Medicine

## 2010-10-16 ENCOUNTER — Ambulatory Visit: Payer: Self-pay | Admitting: *Deleted

## 2010-10-16 ENCOUNTER — Encounter: Payer: Self-pay | Admitting: Internal Medicine

## 2010-10-16 ENCOUNTER — Ambulatory Visit (INDEPENDENT_AMBULATORY_CARE_PROVIDER_SITE_OTHER): Payer: Self-pay

## 2010-10-16 VITALS — BP 109/71 | HR 63 | Temp 97.6°F | Ht 66.0 in | Wt 125.5 lb

## 2010-10-16 DIAGNOSIS — Z21 Asymptomatic human immunodeficiency virus [HIV] infection status: Secondary | ICD-10-CM

## 2010-10-16 DIAGNOSIS — R5381 Other malaise: Secondary | ICD-10-CM

## 2010-10-16 DIAGNOSIS — B20 Human immunodeficiency virus [HIV] disease: Secondary | ICD-10-CM

## 2010-10-16 DIAGNOSIS — B459 Cryptococcosis, unspecified: Secondary | ICD-10-CM

## 2010-10-16 DIAGNOSIS — B977 Papillomavirus as the cause of diseases classified elsewhere: Secondary | ICD-10-CM | POA: Insufficient documentation

## 2010-10-16 DIAGNOSIS — R5383 Other fatigue: Secondary | ICD-10-CM

## 2010-10-16 LAB — BASIC METABOLIC PANEL
BUN: 24 mg/dL — ABNORMAL HIGH (ref 6–23)
CO2: 27 mEq/L (ref 19–32)
Chloride: 104 mEq/L (ref 96–112)
Creat: 1.37 mg/dL (ref 0.40–1.50)
Glucose, Bld: 100 mg/dL — ABNORMAL HIGH (ref 70–99)
Potassium: 4.9 mEq/L (ref 3.5–5.3)

## 2010-10-16 MED ORDER — VITAMIN D3 50 MCG (2000 UT) PO CAPS: 2000.0000 [IU] | ORAL_CAPSULE | Freq: Every day | ORAL | Status: DC

## 2010-10-16 NOTE — Progress Notes (Signed)
  Subjective:    Patient ID: [name] [name], male    DOB: November 15, 1945, 66 y.o.   MRN: [id number]  HPI [name] [name] is in for his routine visit. He has not missed any of his medications. He did have to change his Truvada to every other day because his creatinine had risen. He had the lesions at the corner of his mouth on the right side removed and the pathology showed HPV warts.    Review of Systems     Objective:   Physical Exam  Constitutional: He appears well-developed and well-nourished. No distress.  HENT:  Mouth/Throat: Oropharynx is clear and moist. No oropharyngeal exudate.  Cardiovascular: Normal rate, regular rhythm and normal heart sounds.   No murmur heard. Pulmonary/Chest: Breath sounds normal. He has no wheezes.  Psychiatric: He has a normal mood and affect.          Assessment & Plan:

## 2010-10-16 NOTE — Assessment & Plan Note (Signed)
As infection remains suppressed and he is having very slow CD4 reconstitution. I will continue his current regimen and we will monitor his renal function.

## 2010-10-16 NOTE — Assessment & Plan Note (Signed)
I will have him continue fluconazole until his CD4 count is consistently over 200.

## 2010-10-17 ENCOUNTER — Encounter: Payer: Self-pay | Admitting: Internal Medicine

## 2010-10-17 LAB — CD4/CD8 (T-HELPER/T-SUPPRESSOR CELL)
CD8 % Suppressor T Cell: 63.4
CD8: 1205

## 2010-11-06 ENCOUNTER — Other Ambulatory Visit: Payer: Self-pay | Admitting: *Deleted

## 2010-11-06 DIAGNOSIS — B20 Human immunodeficiency virus [HIV] disease: Secondary | ICD-10-CM

## 2010-11-06 MED ORDER — SULFAMETHOXAZOLE-TRIMETHOPRIM 800-160 MG PO TABS: 1.0000 | ORAL_TABLET | ORAL | Status: DC

## 2011-01-18 ENCOUNTER — Ambulatory Visit (INDEPENDENT_AMBULATORY_CARE_PROVIDER_SITE_OTHER): Payer: Self-pay | Admitting: *Deleted

## 2011-01-18 ENCOUNTER — Encounter: Payer: Self-pay | Admitting: *Deleted

## 2011-01-18 VITALS — BP 98/72 | HR 64 | Temp 98.0°F | Resp 18 | Wt 123.5 lb

## 2011-01-18 DIAGNOSIS — B20 Human immunodeficiency virus [HIV] disease: Secondary | ICD-10-CM

## 2011-01-18 LAB — COMPREHENSIVE METABOLIC PANEL
ALT: 14 U/L (ref 0–53)
AST: 19 U/L (ref 0–37)
CO2: 27 mEq/L (ref 19–32)
Calcium: 9.1 mg/dL (ref 8.4–10.5)
Chloride: 100 mEq/L (ref 96–112)
Sodium: 135 mEq/L (ref 135–145)
Total Bilirubin: 1.4 mg/dL — ABNORMAL HIGH (ref 0.3–1.2)
Total Protein: 6.9 g/dL (ref 6.0–8.3)

## 2011-01-18 LAB — LIPID PANEL

## 2011-01-18 LAB — PHOSPHORUS: Phosphorus: 3.2 mg/dL (ref 2.3–4.6)

## 2011-01-18 NOTE — Progress Notes (Signed)
Patient here for week 112 study visit. He continues to take his Truvada QOD due to low creatine clearance. We are rechecking the creatine today and may change the dose dependent on the labs. He is c/o  ingrown toenail on his rt big toe that is painful. He has noticed an increase in numbness up his calves from his feet. He said the knot on his neck burst and drained. It is now a little red and hard, but not nearly as swollen. He will return in December for his next appt with research. His Medicare supplement Part D starts in November and his co-pays will be 200$ each for his ARVs (Truvada and Reyataz) Norvir is $0 so he is concerned about affording them once he comes off of study next year.

## 2011-01-22 ENCOUNTER — Ambulatory Visit (INDEPENDENT_AMBULATORY_CARE_PROVIDER_SITE_OTHER): Payer: Self-pay | Admitting: *Deleted

## 2011-01-22 ENCOUNTER — Other Ambulatory Visit: Payer: Self-pay | Admitting: *Deleted

## 2011-01-22 DIAGNOSIS — B20 Human immunodeficiency virus [HIV] disease: Secondary | ICD-10-CM

## 2011-01-22 NOTE — Progress Notes (Signed)
Patient came in today to repeat Bmet. His creatinine from 8/31 was 1.66 with a calculated creatine clearance of 36.08. His truvada has been dosed every other day since May already, so we may have to change his regimen around some.

## 2011-01-23 ENCOUNTER — Ambulatory Visit: Payer: Self-pay

## 2011-01-23 ENCOUNTER — Encounter: Payer: Self-pay | Admitting: *Deleted

## 2011-01-23 LAB — BASIC METABOLIC PANEL
BUN: 20 mg/dL (ref 6–23)
CO2: 24 mEq/L (ref 19–32)
Chloride: 104 mEq/L (ref 96–112)
Creat: 1.52 mg/dL — ABNORMAL HIGH (ref 0.50–1.35)
Glucose, Bld: 122 mg/dL — ABNORMAL HIGH (ref 70–99)
Potassium: 4.5 mEq/L (ref 3.5–5.3)

## 2011-01-23 NOTE — Progress Notes (Signed)
Theodoro's creatinine from yesterday is slightly better at 1.52, but his Creatinine clearance is still low at 39.4, even with every other dosing of Truvada. Discussed with Dr. Orvan Falconer who wants to see him at next available appt which is next week. I discussed with Lavoy who will come in next Thursday at 9:30am. The study suggests for tenofovir to be switched with either abacavir, zidovudine or didanosine, but does not provide those medications.

## 2011-01-31 ENCOUNTER — Encounter: Payer: Self-pay | Admitting: Internal Medicine

## 2011-01-31 ENCOUNTER — Ambulatory Visit (INDEPENDENT_AMBULATORY_CARE_PROVIDER_SITE_OTHER): Payer: Self-pay | Admitting: Internal Medicine

## 2011-01-31 VITALS — BP 105/74 | HR 74 | Temp 97.4°F | Wt 123.8 lb

## 2011-01-31 DIAGNOSIS — B20 Human immunodeficiency virus [HIV] disease: Secondary | ICD-10-CM

## 2011-01-31 DIAGNOSIS — E785 Hyperlipidemia, unspecified: Secondary | ICD-10-CM | POA: Insufficient documentation

## 2011-01-31 DIAGNOSIS — N289 Disorder of kidney and ureter, unspecified: Secondary | ICD-10-CM | POA: Insufficient documentation

## 2011-01-31 DIAGNOSIS — Z23 Encounter for immunization: Secondary | ICD-10-CM

## 2011-01-31 MED ORDER — OMEGA-3 FATTY ACIDS 1000 MG PO CAPS: 2.0000 g | ORAL_CAPSULE | Freq: Three times a day (TID) | ORAL | Status: DC

## 2011-01-31 NOTE — Assessment & Plan Note (Signed)
His viral load is undetectable at less than 40 and his CD4 count is 185. I will continue his current regimen but monitor his creatinine closely in about one month. If his creatinine continues to rise I will change his regimen, taking him off of the Viread component of Truvada.

## 2011-01-31 NOTE — Assessment & Plan Note (Signed)
His latest creatinine done on September 4 was down to 1.52. I will continue his current regimen and see him back after repeat basic metabolic panel in one month.

## 2011-01-31 NOTE — Progress Notes (Signed)
  Subjective:    Patient ID: [name] [name], male    DOB: 1946-02-15, 65 y.o.   MRN: [id number]  HPI Tara is in for a routine visit. He continues on Truvada, Reyataz and Norvir year. Over the last few months he's had a slight bump in his creatinine in several months ago his Truvada was decreased to every other day. He does not believe he is missed any doses of his medication.  He is doing well except that he states he has some fatigue and has been watching has on TV and wonders if he might have a low testosterone level. He says that he feels just like they describe in the TV ads.    Review of Systems     Objective:   Physical Exam  Constitutional: He appears well-developed and well-nourished.  HENT:  Mouth/Throat: Oropharynx is clear and moist. No oropharyngeal exudate.  Cardiovascular: Normal rate, regular rhythm and normal heart sounds.   No murmur heard. Pulmonary/Chest: Breath sounds normal. He has no wheezes.  Skin: No rash noted.  Psychiatric: He has a normal mood and affect.          Assessment & Plan:

## 2011-01-31 NOTE — Assessment & Plan Note (Signed)
His total cholesterol is 249 with an HDL of 30. His triglycerides are also elevated at 551. I've asked him to do an Internet search on American Heart Association low-fat diet. I've asked him to consider what changes he might make in his lifestyle to try to improve his dyslipidemia. Also asked him to start fish oil capsules. He will need repeat lipid panel within 3 months.

## 2011-02-04 ENCOUNTER — Encounter: Payer: Self-pay | Admitting: Internal Medicine

## 2011-02-04 LAB — CD4/CD8 (T-HELPER/T-SUPPRESSOR CELL): CD4%: 10.3

## 2011-02-04 LAB — HIV-1 RNA QUANT-NO REFLEX-BLD: HIV-1 RNA Viral Load: <40

## 2011-03-04 ENCOUNTER — Ambulatory Visit (INDEPENDENT_AMBULATORY_CARE_PROVIDER_SITE_OTHER): Payer: Self-pay | Admitting: *Deleted

## 2011-03-04 DIAGNOSIS — B20 Human immunodeficiency virus [HIV] disease: Secondary | ICD-10-CM

## 2011-03-04 LAB — BASIC METABOLIC PANEL
CO2: 26 mEq/L (ref 19–32)
Calcium: 9.2 mg/dL (ref 8.4–10.5)
Potassium: 4.7 mEq/L (ref 3.5–5.3)
Sodium: 139 mEq/L (ref 135–145)

## 2011-03-04 NOTE — Progress Notes (Signed)
Patient came in for a repeat BMP to follow his creatinine for truvada dosing. He wanted to wait to get his testosterone level checked after his medicare kicks in, which is in November.

## 2011-03-19 ENCOUNTER — Ambulatory Visit: Payer: Self-pay | Admitting: Internal Medicine

## 2011-03-25 ENCOUNTER — Ambulatory Visit (INDEPENDENT_AMBULATORY_CARE_PROVIDER_SITE_OTHER): Payer: Medicare PPO | Admitting: Internal Medicine

## 2011-03-25 ENCOUNTER — Encounter: Payer: Self-pay | Admitting: Internal Medicine

## 2011-03-25 VITALS — BP 129/81 | HR 78 | Temp 98.0°F | Ht 66.5 in | Wt 124.5 lb

## 2011-03-25 DIAGNOSIS — B20 Human immunodeficiency virus [HIV] disease: Secondary | ICD-10-CM

## 2011-03-25 DIAGNOSIS — B459 Cryptococcosis, unspecified: Secondary | ICD-10-CM

## 2011-03-25 DIAGNOSIS — N289 Disorder of kidney and ureter, unspecified: Secondary | ICD-10-CM

## 2011-03-25 DIAGNOSIS — R682 Dry mouth, unspecified: Secondary | ICD-10-CM

## 2011-03-25 DIAGNOSIS — K117 Disturbances of salivary secretion: Secondary | ICD-10-CM

## 2011-03-25 DIAGNOSIS — E785 Hyperlipidemia, unspecified: Secondary | ICD-10-CM

## 2011-03-25 MED ORDER — OMEGA-3 FATTY ACIDS 1000 MG PO CAPS: 2.0000 | ORAL_CAPSULE | Freq: Two times a day (BID) | ORAL | Status: AC

## 2011-03-25 NOTE — Progress Notes (Signed)
  Subjective:    Patient ID: [name] [name], male    DOB: [birth year], 65 y.o.   MRN: [id number]  HPI [name] [name] is in for his routine visit. He has not missed a single dose of his HIV medications since his last visit. After his last visit he did start taking fish oil but states he's only taking one capsule daily and isn't sure how many milligrams her in each capsule. He has not changed any other medications. The only thing he's been bothered with recently his persistent chapped lips and a very dry mouth.    Review of Systems     Objective:   Physical Exam  Constitutional: No distress.  HENT:  Mouth/Throat: Oropharynx is clear and moist. No oropharyngeal exudate.       His lips are quite chapped and his oral mucous membranes are somewhat dry.  Eyes: Conjunctivae are normal.  Cardiovascular: Normal rate, regular rhythm and normal heart sounds.   No murmur heard. Pulmonary/Chest: Breath sounds normal. He has no rales.  Abdominal: Soft. Bowel sounds are normal. There is no tenderness.  Skin: No rash noted.  Psychiatric: He has a normal mood and affect.          Assessment & Plan:

## 2011-03-25 NOTE — Assessment & Plan Note (Signed)
He has had mild but stable renal insufficiency for at least the last 6 months. This may be related to his HIV infection and the tenofovir component of Truvada but I will continue his current regimen for now.

## 2011-03-25 NOTE — Assessment & Plan Note (Addendum)
I've asked him to look at his fish oil supplement and increase his dose to 4 g daily. I will repeat a lipid panel before his next visit.

## 2011-03-25 NOTE — Assessment & Plan Note (Signed)
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

## 2011-03-25 NOTE — Assessment & Plan Note (Signed)
Koua's HIV infection is under very good control with a CD4 count of 185 and a Viral load of less than 40 . I will continue his current regimen.

## 2011-03-25 NOTE — Assessment & Plan Note (Signed)
I've asked him to try limiting drop hard candies to see if that helps with his dry mouth. 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

## 2011-05-08 ENCOUNTER — Ambulatory Visit (INDEPENDENT_AMBULATORY_CARE_PROVIDER_SITE_OTHER): Payer: Medicare PPO | Admitting: *Deleted

## 2011-05-08 ENCOUNTER — Other Ambulatory Visit: Payer: Self-pay | Admitting: Internal Medicine

## 2011-05-08 VITALS — BP 100/70 | HR 64 | Temp 97.6°F | Resp 16 | Wt 125.8 lb

## 2011-05-08 DIAGNOSIS — B20 Human immunodeficiency virus [HIV] disease: Secondary | ICD-10-CM

## 2011-05-08 LAB — BASIC METABOLIC PANEL
BUN: 20 mg/dL (ref 6–23)
CO2: 26 mEq/L (ref 19–32)
Calcium: 9.4 mg/dL (ref 8.4–10.5)
Creat: 1.4 mg/dL — ABNORMAL HIGH (ref 0.50–1.35)
Glucose, Bld: 83 mg/dL (ref 70–99)

## 2011-05-08 LAB — HEPATIC FUNCTION PANEL
ALT: 14 U/L (ref 0–53)
Albumin: 3.7 g/dL (ref 3.5–5.2)
Total Protein: 6.9 g/dL (ref 6.0–8.3)

## 2011-05-08 LAB — CD4/CD8 (T-HELPER/T-SUPPRESSOR CELL)
CD4%: 10.3
CD4: 165

## 2011-05-08 LAB — LIPID PANEL
Cholesterol: 252 mg/dL — ABNORMAL HIGH (ref 0–200)
HDL: 29 mg/dL — ABNORMAL LOW (ref >39)
Total CHOL/HDL Ratio: 8.7 Ratio
Triglycerides: 584 mg/dL — ABNORMAL HIGH (ref <150)

## 2011-05-08 LAB — PHOSPHORUS: Phosphorus: 2.8 mg/dL (ref 2.3–4.6)

## 2011-05-08 NOTE — Progress Notes (Signed)
Patient here for his week 128 study visit. He denies any new problems, except that his brother passed away recently from cancer. His mother who has dementia lives with him and he stays busy taking care of her. He recently started on fish oil for his high lipids. He will return in April for the last study visit. This is the last visit where we could give him meds on study and then he will need to follow up with Dr. Orvan Falconer for scrips, labs, etc. He may be eligible for one of the long term observational studies in the Fall. There is one coming looking at older individuals and their comorbidities.

## 2011-05-09 ENCOUNTER — Other Ambulatory Visit: Payer: Self-pay | Admitting: *Deleted

## 2011-05-09 DIAGNOSIS — B20 Human immunodeficiency virus [HIV] disease: Secondary | ICD-10-CM

## 2011-05-09 LAB — LDL CHOLESTEROL, DIRECT: Direct LDL: 110 mg/dL — ABNORMAL HIGH

## 2011-05-09 NOTE — Progress Notes (Signed)
Addended by: Mariea Clonts D on: 05/09/2011 02:13 PM   Modules accepted: Orders

## 2011-06-14 ENCOUNTER — Encounter: Payer: Self-pay | Admitting: Internal Medicine

## 2011-07-04 ENCOUNTER — Ambulatory Visit: Payer: Medicare PPO

## 2011-08-01 ENCOUNTER — Encounter: Payer: Self-pay | Admitting: *Deleted

## 2011-08-01 NOTE — Progress Notes (Signed)
Please ask Traylen to schedule an appointment with me.

## 2011-08-01 NOTE — Progress Notes (Signed)
Patient called and said he had seen Dr. Clent Ridges to get set up as his primary care doctor and she had prescribed zimvastatin for his elevated cholesterol. He does not want to take it because of all the possible side effects and he would continue his fish oil and talk to Dr. Orvan Falconer about it.

## 2011-08-30 ENCOUNTER — Ambulatory Visit (INDEPENDENT_AMBULATORY_CARE_PROVIDER_SITE_OTHER): Payer: Medicare PPO | Admitting: *Deleted

## 2011-08-30 VITALS — BP 109/77 | HR 69 | Temp 97.4°F | Resp 16 | Wt 126.0 lb

## 2011-08-30 DIAGNOSIS — B20 Human immunodeficiency virus [HIV] disease: Secondary | ICD-10-CM

## 2011-08-30 DIAGNOSIS — Z21 Asymptomatic human immunodeficiency virus [HIV] infection status: Secondary | ICD-10-CM

## 2011-08-30 LAB — LIPID PANEL
HDL: 35 mg/dL — ABNORMAL LOW (ref >39)
Total CHOL/HDL Ratio: 8 Ratio

## 2011-08-30 LAB — BASIC METABOLIC PANEL
Calcium: 9.3 mg/dL (ref 8.4–10.5)
Creat: 1.71 mg/dL — ABNORMAL HIGH (ref 0.50–1.35)

## 2011-08-30 LAB — HEPATIC FUNCTION PANEL
Bilirubin, Direct: 0.1 mg/dL (ref 0.0–0.3)
Total Bilirubin: 0.7 mg/dL (ref 0.3–1.2)

## 2011-08-30 LAB — LDL CHOLESTEROL, DIRECT: Direct LDL: 126 mg/dL — ABNORMAL HIGH

## 2011-08-31 LAB — CREATININE, URINE, RANDOM: Creatinine, Urine: 98.9 mg/dL

## 2011-08-31 LAB — PROTEIN, URINE, RANDOM: Total Protein, Urine: 26 mg/dL

## 2011-09-03 ENCOUNTER — Other Ambulatory Visit: Payer: Self-pay | Admitting: *Deleted

## 2011-09-03 DIAGNOSIS — B20 Human immunodeficiency virus [HIV] disease: Secondary | ICD-10-CM

## 2011-09-03 NOTE — Progress Notes (Signed)
Pt here for A5257 Final / A5001, week 144. Discussed letter to study participant to both studies; answered questions; pt verbalized understanding and signed both letters; pt received copies of both letters. Assessment unchanged since last study visit. Pt adhering to ARV regimen. Fasting labs were drawn; vital signs are stable. Pt received $20.00 gift card for study visit. This is the final visit for A5257. Pt will have one more visit on A5001. Tacey Heap RN

## 2011-09-04 ENCOUNTER — Other Ambulatory Visit: Payer: Medicare PPO

## 2011-09-04 ENCOUNTER — Other Ambulatory Visit: Payer: Self-pay | Admitting: Internal Medicine

## 2011-09-04 ENCOUNTER — Encounter: Payer: Self-pay | Admitting: *Deleted

## 2011-09-04 ENCOUNTER — Encounter: Payer: Self-pay | Admitting: Internal Medicine

## 2011-09-04 DIAGNOSIS — B20 Human immunodeficiency virus [HIV] disease: Secondary | ICD-10-CM

## 2011-09-04 LAB — POCT URINALYSIS DIPSTICK
Glucose, UA: NEGATIVE
Nitrite, UA: NEGATIVE
Urobilinogen, UA: 0.2

## 2011-09-04 LAB — BASIC METABOLIC PANEL
Calcium: 9.1 mg/dL (ref 8.4–10.5)
Glucose, Bld: 88 mg/dL (ref 70–99)
Potassium: 5 mEq/L (ref 3.5–5.3)
Sodium: 139 mEq/L (ref 135–145)

## 2011-09-04 MED ORDER — LAMIVUDINE 150 MG PO TABS: 150.0000 mg | ORAL_TABLET | Freq: Every day | ORAL | Status: DC

## 2011-09-04 MED ORDER — ZIDOVUDINE 300 MG PO TABS: 300.0000 mg | ORAL_TABLET | Freq: Two times a day (BID) | ORAL | Status: DC

## 2011-09-04 NOTE — Progress Notes (Signed)
Patient ID: [name] [name], male   DOB: [birth year], 66 y.o.   MRN: [id number] HIV 1 RNA Quant (copies/mL)  Date Value  06/05/2010 39   02/14/2010 39   10/23/2009 39      HIV-1 RNA Viral Load (no units)  Date Value  05/08/2011 <40   01/18/2011 <40   09/21/2010 <40      CD4 T Cell Abs (cmm)  Date Value  10/19/2008 60*    Lab Results  Component Value Date   CREATININE 1.69* 09/04/2011   BUN 19 09/04/2011   NA 139 09/04/2011   K 5.0 09/04/2011   CL 103 09/04/2011   CO2 27 09/04/2011    [name] [name]'s creatinine has continued to increase slowly. I will discontinue Truvada and replace it with renally adjusted Epivir and Retrovir, while continuing Reyataz and Norvir.  Plan: 1. Discontinue Truvada 2. Start Epivir 150 mg daily 3. Start Retrovir 300 mg twice a day 4. Continue Reyataz and Norvir   Cliffton Asters, MD [facility] for Infectious Diseases [facility] [phone] pager   [phone] cell 09/04/2011, 4:30 PM

## 2011-09-04 NOTE — Progress Notes (Signed)
Patient ID: Noelle Hoogland, male   DOB: October 31, 1945, 66 y.o.   MRN: [id number]  Pt is now off study A5257 and can be changed to a ARV regimen that would not affect his creatinine clearance as much. I spoke with Dr. Orvan Falconer and he has changed regimen and sent to pharmacy. Called patient to inform him of his change in ARV regimen. Instructed pt to continue taking his current ARV regimen until new ARV medications came in the mail. He verbalized understanding and importance of this. Instructed pt that he is to stop taking his Truvada and replace it with the new medications, Epivir 150mg  QD and Retrovir 300mg  BID; he is to continue taking his Norvir and Reyataz with this regimen. Pt read back the information I provided for him and verbalized understanding. He has my number and I told him if he had any questions to give me a call. I asked if I could scheduled him for lab 6 weeks following his new regimen. He stated he was to see Dr. Orvan Falconer 4/30 and will schedule it then. Tacey Heap RN

## 2011-09-05 ENCOUNTER — Other Ambulatory Visit: Payer: Self-pay | Admitting: *Deleted

## 2011-09-05 ENCOUNTER — Encounter: Payer: Self-pay | Admitting: *Deleted

## 2011-09-05 DIAGNOSIS — B20 Human immunodeficiency virus [HIV] disease: Secondary | ICD-10-CM

## 2011-09-05 MED ORDER — ZIDOVUDINE 300 MG PO TABS: 300.0000 mg | ORAL_TABLET | Freq: Two times a day (BID) | ORAL | Status: DC

## 2011-09-05 MED ORDER — ATAZANAVIR SULFATE 300 MG PO CAPS: 300.0000 mg | ORAL_CAPSULE | Freq: Every day | ORAL | Status: DC

## 2011-09-05 MED ORDER — LAMIVUDINE 150 MG PO TABS: 150.0000 mg | ORAL_TABLET | Freq: Every day | ORAL | Status: DC

## 2011-09-05 NOTE — Progress Notes (Signed)
Patient ID: [name] [name], male   DOB: 10/05/1945, 66 y.o.   MRN: [id number]  Pt called to inform me that research has been prescribing the Reyataz and a new prescription needed to be sent to [facility] in [place] since he is now off study and will not be receiving any more medications from them. I called Reyataz Rx in to [facility] [phone]) and verified his regimen (Epivir, Retrovir, Norvir, and Reyataz) I spoke with pt and let him know this had been done. Tacey Heap RN

## 2011-09-17 ENCOUNTER — Encounter: Payer: Self-pay | Admitting: Internal Medicine

## 2011-09-17 ENCOUNTER — Ambulatory Visit (INDEPENDENT_AMBULATORY_CARE_PROVIDER_SITE_OTHER): Payer: Medicare PPO | Admitting: Internal Medicine

## 2011-09-17 VITALS — BP 124/75 | HR 72 | Temp 98.1°F | Ht 66.0 in | Wt 125.0 lb

## 2011-09-17 DIAGNOSIS — B20 Human immunodeficiency virus [HIV] disease: Secondary | ICD-10-CM

## 2011-09-17 NOTE — Progress Notes (Signed)
Patient ID: [name] [name], male   DOB: 05/26/1945, 66 y.o.   MRN: [id number]  INFECTIOUS DISEASE PROGRESS NOTE    Subjective: [name] [name] is in for his routine visit. I recently changed his antiretrovirals therapy to renally dosed Retrovir and Epivir along with his Reyataz and Norvir after his creatinine went up slightly. He has not missed any doses of his medications. He states that he is switched to a new preparation of fish oil which he tolerates better. He is under a great deal of stress helping care for his 72 year old mother who is in the throws of dementia. He does not feel like he can stop smoking cigarettes now.  Objective: Temp: 98.1 F (36.7 C) (04/30 0958) Temp src: Oral (04/30 0958) BP: 124/75 mmHg (04/30 0958) Pulse Rate: 72  (04/30 0958)  General: He is in good spirits Skin: No rash Lungs: Clear Cor: Regular S1 and S2 no murmurs  Lab Results HIV 1 RNA Quant (copies/mL)  Date Value  06/05/2010 39   02/14/2010 39   10/23/2009 39      HIV-1 RNA Viral Load (no units)  Date Value  05/08/2011 <40   01/18/2011 <40   09/21/2010 <40      CD4 T Cell Abs (cmm)  Date Value  10/19/2008 60*     Assessment: His latest CD4 count is up to 249, the highest since he was diagnosed with HIV infection. His viral load remains undetectable at less than 40. 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 He continues to have problems with dyslipidemia. He refused to start a statin drug prescribed by his primary care doctor, Dr. Clent Ridges because of potential drug drug interactions. He is due to see her again next month to reconsider this.  Plan: 1. Continue current antiretroviral regimen 2. Followup after lab work in 6 months   Cliffton Asters, MD [facility] for Infectious Diseases [facility] [phone] pager   [phone] cell 09/17/2011, 10:19 AM

## 2011-09-24 ENCOUNTER — Other Ambulatory Visit: Payer: Self-pay | Admitting: Licensed Clinical Social Worker

## 2011-09-24 ENCOUNTER — Other Ambulatory Visit: Payer: Self-pay | Admitting: *Deleted

## 2011-09-24 DIAGNOSIS — B20 Human immunodeficiency virus [HIV] disease: Secondary | ICD-10-CM

## 2011-09-24 MED ORDER — RITONAVIR 100 MG PO TABS: 100.0000 mg | ORAL_TABLET | Freq: Every day | ORAL | Status: DC

## 2011-09-25 ENCOUNTER — Encounter: Payer: Self-pay | Admitting: Internal Medicine

## 2011-09-25 LAB — CD4/CD8 (T-HELPER/T-SUPPRESSOR CELL)
CD4%: 13.1
CD8: 1311

## 2011-10-04 ENCOUNTER — Encounter: Payer: Self-pay | Admitting: Internal Medicine

## 2011-11-06 ENCOUNTER — Ambulatory Visit (INDEPENDENT_AMBULATORY_CARE_PROVIDER_SITE_OTHER): Payer: Medicare PPO | Admitting: *Deleted

## 2011-11-06 VITALS — BP 123/75 | HR 65 | Temp 97.5°F | Wt 130.2 lb

## 2011-11-06 DIAGNOSIS — B20 Human immunodeficiency virus [HIV] disease: Secondary | ICD-10-CM

## 2011-11-06 LAB — COMPREHENSIVE METABOLIC PANEL
Albumin: 4 g/dL (ref 3.5–5.2)
Alkaline Phosphatase: 131 U/L — ABNORMAL HIGH (ref 39–117)
BUN: 17 mg/dL (ref 6–23)
CO2: 26 mEq/L (ref 19–32)
Glucose, Bld: 96 mg/dL (ref 70–99)
Potassium: 4.8 mEq/L (ref 3.5–5.3)
Total Protein: 6.7 g/dL (ref 6.0–8.3)

## 2011-11-06 LAB — LIPID PANEL
Cholesterol: 237 mg/dL — ABNORMAL HIGH (ref 0–200)
HDL: 33 mg/dL — ABNORMAL LOW (ref >39)
LDL Cholesterol: 126 mg/dL — ABNORMAL HIGH (ref 0–99)
Triglycerides: 390 mg/dL — ABNORMAL HIGH (ref <150)

## 2011-11-06 LAB — CD4/CD8 (T-HELPER/T-SUPPRESSOR CELL)
CD8 % Suppressor T Cell: 61.7
CD8: 1172

## 2011-11-06 LAB — HIV-1 RNA QUANT-NO REFLEX-BLD: HIV-1 RNA Viral Load: <40

## 2011-11-06 NOTE — Progress Notes (Signed)
Patient here for his week 160 ALLRT study visit. He says he has been feeling better and not aching as much since his meds have been changed. He stopped the truvada and is now taking epivir and retrovir with the reyataz and norvir. He also stopped the bactrim and fluconazole. He will return in September for the last ALLRT visit and will enroll in the long term study HAILO at that time, an observational study looking at HIV and Aging.

## 2011-11-29 ENCOUNTER — Encounter: Payer: Self-pay | Admitting: Internal Medicine

## 2011-12-24 ENCOUNTER — Ambulatory Visit: Payer: Medicare PPO

## 2012-02-05 ENCOUNTER — Ambulatory Visit (INDEPENDENT_AMBULATORY_CARE_PROVIDER_SITE_OTHER): Payer: Medicare PPO

## 2012-02-05 ENCOUNTER — Ambulatory Visit (INDEPENDENT_AMBULATORY_CARE_PROVIDER_SITE_OTHER): Payer: Medicare PPO | Admitting: *Deleted

## 2012-02-05 VITALS — BP 122/80 | HR 61 | Temp 98.2°F | Wt 126.2 lb

## 2012-02-05 DIAGNOSIS — Z23 Encounter for immunization: Secondary | ICD-10-CM

## 2012-02-05 DIAGNOSIS — B20 Human immunodeficiency virus [HIV] disease: Secondary | ICD-10-CM

## 2012-02-05 LAB — COMPREHENSIVE METABOLIC PANEL
ALT: 15 U/L (ref 0–53)
AST: 16 U/L (ref 0–37)
Albumin: 4.1 g/dL (ref 3.5–5.2)
Alkaline Phosphatase: 124 U/L — ABNORMAL HIGH (ref 39–117)
BUN: 19 mg/dL (ref 6–23)
Calcium: 9.4 mg/dL (ref 8.4–10.5)
Chloride: 105 mEq/L (ref 96–112)
Potassium: 5.2 mEq/L (ref 3.5–5.3)
Sodium: 140 mEq/L (ref 135–145)
Total Protein: 6.9 g/dL (ref 6.0–8.3)

## 2012-02-05 LAB — LIPID PANEL
HDL: 40 mg/dL (ref >39)
LDL Cholesterol: 135 mg/dL — ABNORMAL HIGH (ref 0–99)

## 2012-02-05 LAB — CD4/CD8 (T-HELPER/T-SUPPRESSOR CELL): CD8: 1380

## 2012-02-05 NOTE — Progress Notes (Signed)
Patient here for his final ALLRT study visit. He says he actually is feeling better, less aches and pains since he stopped his fluconazole. He is still having numbness in his feet with some sensation of tightness. He has noticed decreased appetite lately and his weight has dropped a few pounds. He will enroll in the [facility] study once it opens to accrual later this Fall.

## 2012-03-05 ENCOUNTER — Ambulatory Visit (INDEPENDENT_AMBULATORY_CARE_PROVIDER_SITE_OTHER): Payer: Medicare PPO | Admitting: Internal Medicine

## 2012-03-05 VITALS — BP 137/78 | HR 86 | Temp 97.6°F | Ht 66.0 in | Wt 129.0 lb

## 2012-03-05 DIAGNOSIS — B20 Human immunodeficiency virus [HIV] disease: Secondary | ICD-10-CM

## 2012-03-05 NOTE — Progress Notes (Signed)
Patient ID: [name] [name], male   DOB: [birth year], 66 y.o.   MRN: [id number]     [facility] for Infectious Disease  Patient Active Problem List  Diagnosis  . HIV INFECTION  . Cryptococcosis  . CIGARETTE SMOKER  . DENTAL CARIES  . ARTHRALGIA  . LOW BACK PAIN, ACUTE  . SHINGLES, HX OF  . OTHER&UNSPECIFIED DISEASES THE ORAL SOFT TISSUES  . PEYRONIES DISEASE  . HPV (human papilloma virus) infection  . Renal insufficiency  . Dyslipidemia  . Dry mouth    Patient's Medications  New Prescriptions   No medications on file  Previous Medications   ATAZANAVIR (REYATAZ) 300 MG CAPSULE    Take 1 capsule (300 mg total) by mouth daily with breakfast. With norvir   FISH OIL-OMEGA-3 FATTY ACIDS 1000 MG CAPSULE    Take 2 capsules (2 g total) by mouth 2 (two) times daily.   LAMIVUDINE (EPIVIR) 150 MG TABLET    Take 1 tablet (150 mg total) by mouth daily.   MULTIPLE VITAMINS-MINERALS (CENTRUM SILVER PO)    Take 1 tablet by mouth daily.     RITONAVIR (NORVIR) 100 MG TABS    Take 1 tablet (100 mg total) by mouth daily.   ZIDOVUDINE (RETROVIR) 300 MG TABLET    Take 1 tablet (300 mg total) by mouth 2 (two) times daily.  Modified Medications   Modified Medication Previous Medication   CHOLECALCIFEROL (VITAMIN D3) 2000 UNITS CAPSULE Cholecalciferol (VITAMIN D3) 2000 UNITS capsule      Take 2,000 Units by mouth daily.    Take 1 capsule (2,000 Units total) by mouth daily.  Discontinued Medications   No medications on file    Subjective: [name] [name] is in for his routine visit. He has usual he has not missed any doses of his HIV medications. He is still struggling to care for his 30 year old mom who has dementia but he is going away on a cruise in early November. He hopes to quit smoking while on the cruise.  Objective: Temp: 97.6 F (36.4 C) (10/17 0905) BP: 137/78 mmHg (10/17 0905) Pulse Rate: 86  (10/17 0905)  General: he is alert in good spirits Skin: no rash Oral: edentulous Lungs: clear Cor:  regular S1 and S2 no murmurs  Lab Results CD4 229 Viral Load <40   Assessment: [name] [name]'s HIV infection remains under very good control with his current regimen.  Plan: 1. Continue current antiretroviral regimen 2. Return after lab work in 6 months 3. Encouraged quit smoking cigarettes   [name] Asters, MD [facility] for Infectious Disease [facility] [phone] pager   [phone] cell 03/05/2012, 9:17 AM

## 2012-03-20 ENCOUNTER — Encounter: Payer: Self-pay | Admitting: Internal Medicine

## 2012-04-24 ENCOUNTER — Encounter: Payer: Self-pay | Admitting: *Deleted

## 2012-06-10 ENCOUNTER — Other Ambulatory Visit: Payer: Self-pay | Admitting: *Deleted

## 2012-06-10 DIAGNOSIS — B20 Human immunodeficiency virus [HIV] disease: Secondary | ICD-10-CM

## 2012-06-10 MED ORDER — ZIDOVUDINE 300 MG PO TABS: 300.0000 mg | ORAL_TABLET | Freq: Two times a day (BID) | ORAL | Status: DC

## 2012-06-10 MED ORDER — RITONAVIR 100 MG PO TABS: 100.0000 mg | ORAL_TABLET | Freq: Every day | ORAL | Status: DC

## 2012-06-10 MED ORDER — LAMIVUDINE 150 MG PO TABS: 150.0000 mg | ORAL_TABLET | Freq: Every day | ORAL | Status: DC

## 2012-06-10 MED ORDER — ATAZANAVIR SULFATE 300 MG PO CAPS: 300.0000 mg | ORAL_CAPSULE | Freq: Every day | ORAL | Status: DC

## 2012-06-10 NOTE — Telephone Encounter (Signed)
Pt is SPAP approved

## 2012-06-18 ENCOUNTER — Ambulatory Visit: Payer: Medicare PPO

## 2012-07-17 ENCOUNTER — Ambulatory Visit (INDEPENDENT_AMBULATORY_CARE_PROVIDER_SITE_OTHER): Payer: Medicare PPO | Admitting: *Deleted

## 2012-07-17 ENCOUNTER — Other Ambulatory Visit: Payer: Medicare PPO

## 2012-07-17 VITALS — BP 116/77 | HR 67 | Temp 97.5°F | Resp 16 | Ht 66.5 in | Wt 132.0 lb

## 2012-07-17 DIAGNOSIS — Z21 Asymptomatic human immunodeficiency virus [HIV] infection status: Secondary | ICD-10-CM

## 2012-07-17 DIAGNOSIS — B20 Human immunodeficiency virus [HIV] disease: Secondary | ICD-10-CM

## 2012-07-17 LAB — RPR

## 2012-07-17 LAB — HEPATITIS B SURFACE ANTIGEN: Hepatitis B Surface Ag: NEGATIVE

## 2012-07-17 LAB — LIPID PANEL
Cholesterol: 231 mg/dL — ABNORMAL HIGH (ref 0–200)
Total CHOL/HDL Ratio: 6.6 Ratio
VLDL: 57 mg/dL — ABNORMAL HIGH (ref 0–40)

## 2012-07-17 LAB — COMPREHENSIVE METABOLIC PANEL
ALT: 16 U/L (ref 0–53)
Alkaline Phosphatase: 136 U/L — ABNORMAL HIGH (ref 39–117)
CO2: 26 mEq/L (ref 19–32)
Creat: 1.66 mg/dL — ABNORMAL HIGH (ref 0.50–1.35)
Total Bilirubin: 1.5 mg/dL — ABNORMAL HIGH (ref 0.3–1.2)

## 2012-07-17 LAB — CBC WITH DIFFERENTIAL/PLATELET
Eosinophils Absolute: 0.3 10*3/uL (ref 0.0–0.7)
Eosinophils Relative: 6 % — ABNORMAL HIGH (ref 0–5)
Lymphs Abs: 1.8 10*3/uL (ref 0.7–4.0)
MCH: 40.9 pg — ABNORMAL HIGH (ref 26.0–34.0)
MCV: 116.2 fL — ABNORMAL HIGH (ref 78.0–100.0)
Monocytes Absolute: 0.4 10*3/uL (ref 0.1–1.0)
Platelets: 204 10*3/uL (ref 150–400)
RBC: 3.59 MIL/uL — ABNORMAL LOW (ref 4.22–5.81)
RDW: 13.5 % (ref 11.5–15.5)

## 2012-07-17 LAB — T-HELPER CELL (CD4) - (RCID CLINIC ONLY): CD4 % Helper T Cell: 12 % — ABNORMAL LOW (ref 33–55)

## 2012-07-17 LAB — HEPATITIS C ANTIBODY: HCV Ab: NEGATIVE

## 2012-07-17 NOTE — Progress Notes (Signed)
Pt here for ENTRY into A5322 HAILO study. Consent reviewed and answered questions. Pt verbalized understanding and signed consent. Fasting labs were drawn with no problems. Pt c/o tingling in feet bilat; Dr. Clent Ridges has started him on Gabapentin to see if it will help. Nodule noted on right dorsal area of the neck. Clubbing of fingernails also noted. Dentures were present. He received $50 for study visit. Next appointment scheduled for Wednsday, December 02, 2012 @ 9am.

## 2012-07-21 ENCOUNTER — Encounter: Payer: Self-pay | Admitting: *Deleted

## 2012-07-21 ENCOUNTER — Other Ambulatory Visit: Payer: Self-pay | Admitting: *Deleted

## 2012-07-21 DIAGNOSIS — Z0000 Encounter for general adult medical examination without abnormal findings: Secondary | ICD-10-CM

## 2012-07-21 LAB — HIV-1 RNA QUANT-NO REFLEX-BLD
HIV 1 RNA Quant: <20 copies/mL (ref <20)
HIV-1 RNA Quant, Log: <1.3 {Log} (ref <1.30)

## 2012-07-21 NOTE — Progress Notes (Signed)
Patient ID: [name] [name], male   DOB: 1946-01-01, 67 y.o.   MRN: [id number]  [name] is a nonresponder to previous hepatitis B vaccination. He needs another 3 dose series with a double dose of hepatitis B vaccine.

## 2012-07-21 NOTE — Progress Notes (Signed)
Patient ID: [name] [name], male   DOB: 1945/06/12, 67 y.o.   MRN: [id number]   Pt hepatitis B surface antibody resulted as non-reactive on 07/17/2012. Pt has had Hep B vaccinations on the following dates: 06/14/09, 02/14/09, 12/13/08. Will notify Dr. Orvan Falconer to see if patient is elligible to receive Hep B booster. Tacey Heap RN

## 2012-07-21 NOTE — Progress Notes (Signed)
Patient ID: [name] [name], male   DOB: 01-28-1946, 67 y.o.   MRN: [id number]  Will order and start immunization process at upcoming visit to clinic. Thank you. Tacey Heap RN

## 2012-08-20 ENCOUNTER — Other Ambulatory Visit: Payer: Medicare PPO

## 2012-09-03 ENCOUNTER — Encounter: Payer: Self-pay | Admitting: Internal Medicine

## 2012-09-03 ENCOUNTER — Ambulatory Visit (INDEPENDENT_AMBULATORY_CARE_PROVIDER_SITE_OTHER): Payer: Medicare PPO | Admitting: Internal Medicine

## 2012-09-03 VITALS — BP 132/77 | HR 62 | Temp 98.1°F | Ht 65.0 in | Wt 135.0 lb

## 2012-09-03 DIAGNOSIS — E785 Hyperlipidemia, unspecified: Secondary | ICD-10-CM

## 2012-09-03 DIAGNOSIS — B20 Human immunodeficiency virus [HIV] disease: Secondary | ICD-10-CM

## 2012-09-03 DIAGNOSIS — Z79899 Other long term (current) drug therapy: Secondary | ICD-10-CM

## 2012-09-03 MED ORDER — OMEGA-3 FATTY ACIDS 1000 MG PO CAPS: 1.0000 g | ORAL_CAPSULE | Freq: Two times a day (BID) | ORAL | Status: DC

## 2012-09-03 MED ORDER — ROSUVASTATIN CALCIUM 5 MG PO TABS: 5.0000 mg | ORAL_TABLET | Freq: Every day | ORAL | Status: DC

## 2012-09-03 MED ORDER — GABAPENTIN 100 MG PO CAPS: 200.0000 mg | ORAL_CAPSULE | Freq: Two times a day (BID) | ORAL | Status: DC

## 2012-09-03 NOTE — Progress Notes (Signed)
Patient ID: [name] [name], male   DOB: 1945/11/21, 67 y.o.   MRN: [id number]          [facility] for Infectious Disease  Patient Active Problem List  Diagnosis  . HIV INFECTION  . Cryptococcosis  . CIGARETTE SMOKER  . DENTAL CARIES  . ARTHRALGIA  . LOW BACK PAIN, ACUTE  . SHINGLES, HX OF  . OTHER&UNSPECIFIED DISEASES THE ORAL SOFT TISSUES  . PEYRONIES DISEASE  . HPV (human papilloma virus) infection  . Renal insufficiency  . Dyslipidemia  . Dry mouth    Patient's Medications  New Prescriptions   No medications on file  Previous Medications   ATAZANAVIR (REYATAZ) 300 MG CAPSULE    Take 1 capsule (300 mg total) by mouth daily with breakfast. With norvir   CHOLECALCIFEROL (VITAMIN D3) 2000 UNITS CAPSULE    Take 2,000 Units by mouth daily.   LAMIVUDINE (EPIVIR) 150 MG TABLET    Take 1 tablet (150 mg total) by mouth daily.   MULTIPLE VITAMINS-MINERALS (CENTRUM SILVER PO)    Take 1 tablet by mouth daily.     RITONAVIR (NORVIR) 100 MG TABS    Take 1 tablet (100 mg total) by mouth daily.   ZIDOVUDINE (RETROVIR) 300 MG TABLET    Take 1 tablet (300 mg total) by mouth 2 (two) times daily.  Modified Medications   Modified Medication Previous Medication   GABAPENTIN (NEURONTIN) 100 MG CAPSULE gabapentin (NEURONTIN) 100 MG capsule      Take 2 capsules (200 mg total) by mouth 2 (two) times daily.    Take 100 mg by mouth 2 (two) times daily.  Discontinued Medications   No medications on file    Subjective: [name] [name] is in for his routine visit. As usual he has not missed a single dose of his medications since his last visit. Unfortunately he has not been able to quit smoking but hopes to quit by his birthday in late May. He was started on Neurontin but has not noticed any improvement in the tingling and numbness in his feet. He has not had any trouble tolerating a very low dose.  Objective: Temp: 98.1 F (36.7 C) (04/17 0855) Temp src: Oral (04/17 0855) BP: 132/77 mmHg (04/17  0855) Pulse Rate: 62 (04/17 0855)  General: He is in good spirits as usual Oral: Full dentures. No thrush or other lesions.  Skin: No rash Lungs: Clear Cor: Regular S1 and S2 no murmurs Abdomen: Nontender  Lab Results HIV 1 RNA Quant (copies/mL)  Date Value  07/17/2012 <20   06/05/2010 39   02/14/2010 39      HIV-1 RNA Viral Load (no units)  Date Value  02/05/2012 <40   11/06/2011 <40   08/30/2011 <40      CD4 T Cell Abs (cmm)  Date Value  07/17/2012 220*  10/19/2008 60*     Assessment: His HIV infection is under excellent control with his current regimen and he is having slow CD4 reconstitution. I will continue his current regimen.  Has not noticed any improvement on low-dose Neurontin but I will have him double the dose now and call me in one month if he is not better.  I talked to him about his cigarette cessation plan and encouraged him to stick with his quit date in May.  His renal insufficiency is mild and stable.  He continues to have dyslipidemia. His triglycerides have improved over the past year but I will have him increase his fish oil and also  start him on low-dose Crestor.  Plan: 1. Continue current antiretroviral regimen 2. Increase Neurontin 200 mg twice daily 3. Increase fish oil 4. Start Crestor 5 mg daily 5. Cigarette Cessation counseling provided 6. Followup after lab work in 6 months   Cliffton Asters, MD [facility] for Infectious Disease [facility] [phone] pager   [phone] cell 09/03/2012, 9:14 AM

## 2012-11-26 NOTE — Addendum Note (Signed)
Addended by: Jennet Maduro D on: 11/26/2012 05:05 PM   Modules accepted: Orders

## 2012-12-04 ENCOUNTER — Ambulatory Visit (INDEPENDENT_AMBULATORY_CARE_PROVIDER_SITE_OTHER): Payer: Medicare PPO | Admitting: *Deleted

## 2012-12-04 ENCOUNTER — Other Ambulatory Visit: Payer: Medicare PPO

## 2012-12-04 VITALS — BP 119/77 | HR 56 | Temp 97.5°F | Wt 133.0 lb

## 2012-12-04 DIAGNOSIS — Z21 Asymptomatic human immunodeficiency virus [HIV] infection status: Secondary | ICD-10-CM

## 2012-12-04 DIAGNOSIS — E785 Hyperlipidemia, unspecified: Secondary | ICD-10-CM

## 2012-12-04 DIAGNOSIS — Z79899 Other long term (current) drug therapy: Secondary | ICD-10-CM

## 2012-12-04 DIAGNOSIS — B20 Human immunodeficiency virus [HIV] disease: Secondary | ICD-10-CM

## 2012-12-04 LAB — COMPREHENSIVE METABOLIC PANEL
Albumin: 4 g/dL (ref 3.5–5.2)
Alkaline Phosphatase: 131 U/L — ABNORMAL HIGH (ref 39–117)
BUN: 16 mg/dL (ref 6–23)
CO2: 26 mEq/L (ref 19–32)
Calcium: 9.3 mg/dL (ref 8.4–10.5)
Creat: 1.65 mg/dL — ABNORMAL HIGH (ref 0.50–1.35)
Glucose, Bld: 86 mg/dL (ref 70–99)
Sodium: 140 mEq/L (ref 135–145)

## 2012-12-04 LAB — CBC WITH DIFFERENTIAL/PLATELET
Basophils Absolute: 0 10*3/uL (ref 0.0–0.1)
Eosinophils Absolute: 0.3 10*3/uL (ref 0.0–0.7)
Lymphocytes Relative: 35 % (ref 12–46)
Lymphs Abs: 1.9 10*3/uL (ref 0.7–4.0)
MCH: 41.7 pg — ABNORMAL HIGH (ref 26.0–34.0)
Neutrophils Relative %: 51 % (ref 43–77)
Platelets: 245 10*3/uL (ref 150–400)
RBC: 3.26 MIL/uL — ABNORMAL LOW (ref 4.22–5.81)
RDW: 14.2 % (ref 11.5–15.5)
WBC: 5.4 10*3/uL (ref 4.0–10.5)

## 2012-12-04 LAB — LIPID PANEL
HDL: 42 mg/dL (ref >39)
Total CHOL/HDL Ratio: 4 Ratio
Triglycerides: 203 mg/dL — ABNORMAL HIGH (ref <150)

## 2012-12-04 NOTE — Progress Notes (Signed)
Patient here for week 24 A5322 visit. He says he has started taking crestor and stopped the fish oil and has asked to have his lipids rechecked today. He has noticed an increase in fatigue, sob with exertion and  an increase in numbness/tingling to his lower extremities. He denies any chest pain or leg pain when he walks and feels that the SOB is more related to his smoking, though he does notice some increased pedal edema.He does not feel that the gabapentin is helping any with the neuropathy and may stop it soon. He is looking for a new primary care doctor since the one he just got established has now left. He is to see Dr. Orvan Falconer in October and research again in January.

## 2012-12-07 LAB — HIV-1 RNA QUANT-NO REFLEX-BLD: HIV 1 RNA Quant: <20 copies/mL (ref <20)

## 2012-12-23 ENCOUNTER — Other Ambulatory Visit: Payer: Self-pay

## 2013-01-06 ENCOUNTER — Ambulatory Visit: Payer: Medicare PPO

## 2013-03-09 ENCOUNTER — Ambulatory Visit (INDEPENDENT_AMBULATORY_CARE_PROVIDER_SITE_OTHER): Payer: Medicare PPO | Admitting: Internal Medicine

## 2013-03-09 ENCOUNTER — Encounter: Payer: Self-pay | Admitting: Internal Medicine

## 2013-03-09 VITALS — BP 132/74 | HR 82 | Temp 97.7°F | Ht 65.5 in | Wt 132.5 lb

## 2013-03-09 DIAGNOSIS — B20 Human immunodeficiency virus [HIV] disease: Secondary | ICD-10-CM

## 2013-03-09 DIAGNOSIS — Z23 Encounter for immunization: Secondary | ICD-10-CM

## 2013-03-09 NOTE — Progress Notes (Signed)
Patient ID: [name] [name], male   DOB: [birth year], 67 y.o.   MRN: [id number]          [facility] for Infectious Disease  Patient Active Problem List   Diagnosis Date Noted  . Renal insufficiency 01/31/2011    Priority: High  . Dyslipidemia 01/31/2011    Priority: High  . HIV INFECTION 10/27/2008    Priority: High  . CIGARETTE SMOKER 10/27/2008    Priority: High  . 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 Dry mouth 03/25/2011  . 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 OTHER&UNSPECIFIED DISEASES THE ORAL SOFT TISSUES 07/04/2010  . PEYRONIES DISEASE 07/04/2010  . ARTHRALGIA 12/26/2009  . LOW BACK PAIN, ACUTE 09/12/2009  . DENTAL CARIES 10/27/2008  . SHINGLES, HX OF 10/27/2008    Patient's Medications  New Prescriptions   No medications on file  Previous Medications   ATAZANAVIR (REYATAZ) 300 MG CAPSULE    Take 1 capsule (300 mg total) by mouth daily with breakfast. With norvir   CHOLECALCIFEROL (VITAMIN D3) 2000 UNITS CAPSULE    Take 2,000 Units by mouth daily.   FISH OIL-OMEGA-3 FATTY ACIDS 1000 MG CAPSULE    Take 1 capsule (1 g total) by mouth 2 (two) times daily.   GABAPENTIN (NEURONTIN) 100 MG CAPSULE    Take 2 capsules (200 mg total) by mouth 2 (two) times daily.   LAMIVUDINE (EPIVIR) 150 MG TABLET    Take 1 tablet (150 mg total) by mouth daily.   MULTIPLE VITAMINS-MINERALS (CENTRUM SILVER PO)    Take 1 tablet by mouth daily.     RITONAVIR (NORVIR) 100 MG TABS    Take 1 tablet (100 mg total) by mouth daily.   ROSUVASTATIN (CRESTOR) 5 MG TABLET    Take 1 tablet (5 mg total) by mouth daily.   ZIDOVUDINE (RETROVIR) 300 MG TABLET    Take 1 tablet (300 mg total) by mouth 2 (two) times daily.  Modified Medications   No medications on file  Discontinued Medications   No medications on file    Subjective: [name] [name] is in for his routine visit. Has not missed any doses of his HIV medications. He is tolerating Crestor well. He has not noted any improvement  on the increased dose of Neurontin he thinks it is caused some sensitivity around his nipples. He would like to stop it. He is scheduled to go on his [place] cruise next week. Unfortunately, he has not been able to quit smoking cigarettes. He will be unable to smoke while on the cruise. Review of Systems: Pertinent items are noted in HPI.  No past medical history on file.  History  Substance Use Topics  . Smoking status: Current Every Day Smoker -- 1.00 packs/day    Types: Cigarettes  . Smokeless tobacco: Never Used     Comment: stopped Wellbutrin  . Alcohol Use: 0.5 oz/week    1 drink(s) per week    No family history on file.  No Known Allergies  Objective: Temp: 97.7 F (36.5 C) (10/21 0938) Temp src: Oral (10/21 0938) BP: 132/74 mmHg (10/21 0938) Pulse Rate: 82 (10/21 0938)  General: He is in good spirits Oral: No oropharyngeal lesions Skin: No rash Lungs: Clear Cor: Regular S1 and S2 no murmurs Mood and affect: Normal  Lab Results HIV 1 RNA Quant (copies/mL)  Date Value  12/04/2012 <20   07/17/2012 <20   06/05/2010 39      HIV-1 RNA Viral Load (no units)  Date Value  02/05/2012 <40   11/06/2011 <40   08/30/2011 <40      CD4 T Cell Abs (cmm)  Date Value  12/04/2012 220*  07/17/2012 220*  10/19/2008 60*     BMET    Component Value Date/Time   NA 140 12/04/2012 0922   K 4.8 12/04/2012 0922   CL 105 12/04/2012 0922   CO2 26 12/04/2012 0922   GLUCOSE 86 12/04/2012 0922   BUN 16 12/04/2012 0922   CREATININE 1.65* 12/04/2012 0922   CREATININE 1.24 06/05/2010 2052   CALCIUM 9.3 12/04/2012 0922   GFRNONAA >60 10/31/2008 2038   GFRAA >60 10/31/2008 2038   Lab Results  Component Value Date   CHOL 169 12/04/2012   HDL 42 12/04/2012   LDLCALC 86 12/04/2012   LDLDIRECT 126* 08/30/2011   TRIG 203* 12/04/2012   CHOLHDL 4.0 12/04/2012   Assessment: His HIV infection remains under excellent control. I will continue his current antiretroviral regimen.  His lipids have  improved since starting Crestor.  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 I will have him stop it. He will be no symptoms worsen so we can consider trials of other agents.  I encouraged him to quit smoking completely.  He has mild renal insufficiency which is stable.  Plan: 1. Continue current antiretroviral regimen 2. Continue Crestor 3. Discontinue Neurontin 4. Followup after lab work in 6 months   Cliffton Asters, MD [facility] for Infectious Disease [facility] [phone] pager   [phone] cell 03/09/2013, 9:53 AM

## 2013-05-25 ENCOUNTER — Ambulatory Visit (INDEPENDENT_AMBULATORY_CARE_PROVIDER_SITE_OTHER): Payer: Self-pay | Admitting: *Deleted

## 2013-05-25 VITALS — BP 105/71 | HR 80 | Temp 97.6°F | Resp 16 | Ht 65.0 in | Wt 129.0 lb

## 2013-05-25 DIAGNOSIS — Z21 Asymptomatic human immunodeficiency virus [HIV] infection status: Secondary | ICD-10-CM

## 2013-05-25 DIAGNOSIS — B20 Human immunodeficiency virus [HIV] disease: Secondary | ICD-10-CM

## 2013-05-25 LAB — COMPREHENSIVE METABOLIC PANEL
ALK PHOS: 141 U/L — AB (ref 39–117)
ALT: 13 U/L (ref 0–53)
AST: 15 U/L (ref 0–37)
Albumin: 4.1 g/dL (ref 3.5–5.2)
BUN: 23 mg/dL (ref 6–23)
CO2: 26 meq/L (ref 19–32)
CREATININE: 1.89 mg/dL — AB (ref 0.50–1.35)
Calcium: 9 mg/dL (ref 8.4–10.5)
Chloride: 101 mEq/L (ref 96–112)
Glucose, Bld: 83 mg/dL (ref 70–99)
Potassium: 4.4 mEq/L (ref 3.5–5.3)
Sodium: 136 mEq/L (ref 135–145)
Total Bilirubin: 2.4 mg/dL — ABNORMAL HIGH (ref 0.3–1.2)
Total Protein: 7 g/dL (ref 6.0–8.3)

## 2013-05-25 LAB — CBC WITH DIFFERENTIAL/PLATELET
BASOS ABS: 0.1 10*3/uL (ref 0.0–0.1)
Basophils Relative: 1 % (ref 0–1)
Eosinophils Absolute: 0.3 10*3/uL (ref 0.0–0.7)
Eosinophils Relative: 7 % — ABNORMAL HIGH (ref 0–5)
HEMATOCRIT: 37.4 % — AB (ref 39.0–52.0)
HEMOGLOBIN: 13.3 g/dL (ref 13.0–17.0)
LYMPHS PCT: 37 % (ref 12–46)
Lymphs Abs: 1.9 10*3/uL (ref 0.7–4.0)
MCH: 42.2 pg — ABNORMAL HIGH (ref 26.0–34.0)
MCHC: 35.6 g/dL (ref 30.0–36.0)
MCV: 118.7 fL — AB (ref 78.0–100.0)
MONO ABS: 0.5 10*3/uL (ref 0.1–1.0)
MONOS PCT: 9 % (ref 3–12)
Neutro Abs: 2.5 10*3/uL (ref 1.7–7.7)
Neutrophils Relative %: 46 % (ref 43–77)
Platelets: 252 10*3/uL (ref 150–400)
RBC: 3.15 MIL/uL — ABNORMAL LOW (ref 4.22–5.81)
RDW: 13.6 % (ref 11.5–15.5)
WBC: 5.3 10*3/uL (ref 4.0–10.5)

## 2013-05-25 LAB — LIPID PANEL
CHOL/HDL RATIO: 4.3 ratio
Cholesterol: 171 mg/dL (ref 0–200)
HDL: 40 mg/dL (ref >39)
LDL CALC: 64 mg/dL (ref 0–99)
TRIGLYCERIDES: 333 mg/dL — AB (ref <150)
VLDL: 67 mg/dL — AB (ref 0–40)

## 2013-05-25 LAB — HEMOGLOBIN A1C
Hgb A1c MFr Bld: 5.3 % (ref <5.7)
MEAN PLASMA GLUCOSE: 105 mg/dL (ref <117)

## 2013-05-25 NOTE — Addendum Note (Signed)
Addended by: Dolan Amen D on: 05/25/2013 06:31 PM   Modules accepted: Orders

## 2013-05-25 NOTE — Progress Notes (Signed)
[name] [name] is here for A5322, week 19. He is feeling well. Did try to quit smoking however his mother fell at home and he started back. His mother did not break any bones and now has some help at home with his mother. Stopped taking Gabapentin in Oct 2014 since he had not felt any changes. Since he is back from his cruise he will discuss with Dr. Megan Salon other options for his neuropathy. Questionnaires and neuro testing completed. Fasting labs were obtained and vital signs are stable. 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 This is located on the right lateral side on his neck. It is not red, warm to touch, and is negative for drainage. He denies any pain with this nodule. He received $50 gift card for visit. Next appt scheduled for Sept 1, 2015 at [place] RN

## 2013-05-26 LAB — PROTEIN, URINE, RANDOM: TOTAL PROTEIN, URINE: 22 mg/dL

## 2013-05-26 LAB — CREATININE, URINE, RANDOM: Creatinine, Urine: 102.4 mg/dL

## 2013-05-27 LAB — T-HELPER CELL (CD4) - (RCID CLINIC ONLY)
CD4 % Helper T Cell: 15 % — ABNORMAL LOW (ref 33–55)
CD4 T CELL ABS: 310 /uL — AB (ref 400–2700)

## 2013-05-27 LAB — HIV-1 RNA QUANT-NO REFLEX-BLD: HIV-1 RNA Quant, Log: <1.3 {Log} (ref <1.30)

## 2013-06-14 ENCOUNTER — Ambulatory Visit: Payer: Medicare HMO

## 2013-06-14 ENCOUNTER — Other Ambulatory Visit: Payer: Self-pay | Admitting: *Deleted

## 2013-06-14 ENCOUNTER — Encounter: Payer: Self-pay | Admitting: *Deleted

## 2013-06-14 DIAGNOSIS — B20 Human immunodeficiency virus [HIV] disease: Secondary | ICD-10-CM

## 2013-06-14 MED ORDER — ZIDOVUDINE 300 MG PO TABS: 300.0000 mg | ORAL_TABLET | Freq: Two times a day (BID) | ORAL | Status: DC

## 2013-06-14 MED ORDER — ATAZANAVIR SULFATE 300 MG PO CAPS: 300.0000 mg | ORAL_CAPSULE | Freq: Every day | ORAL | Status: DC

## 2013-06-14 MED ORDER — RITONAVIR 100 MG PO TABS: 100.0000 mg | ORAL_TABLET | Freq: Every day | ORAL | Status: DC

## 2013-06-14 MED ORDER — LAMIVUDINE 150 MG PO TABS: 150.0000 mg | ORAL_TABLET | Freq: Every day | ORAL | Status: DC

## 2013-06-28 ENCOUNTER — Other Ambulatory Visit: Payer: Self-pay | Admitting: Licensed Clinical Social Worker

## 2013-06-28 ENCOUNTER — Other Ambulatory Visit: Payer: Self-pay | Admitting: Internal Medicine

## 2013-06-28 DIAGNOSIS — B20 Human immunodeficiency virus [HIV] disease: Secondary | ICD-10-CM

## 2013-06-28 MED ORDER — LAMIVUDINE 150 MG PO TABS: 150.0000 mg | ORAL_TABLET | Freq: Every day | ORAL | Status: DC

## 2013-06-28 MED ORDER — ZIDOVUDINE 300 MG PO TABS: 300.0000 mg | ORAL_TABLET | Freq: Two times a day (BID) | ORAL | Status: DC

## 2013-06-28 MED ORDER — RITONAVIR 100 MG PO TABS: 100.0000 mg | ORAL_TABLET | Freq: Every day | ORAL | Status: DC

## 2013-08-06 ENCOUNTER — Other Ambulatory Visit: Payer: Self-pay | Admitting: Internal Medicine

## 2013-08-25 ENCOUNTER — Other Ambulatory Visit: Payer: Medicare HMO

## 2013-09-07 ENCOUNTER — Encounter: Payer: Self-pay | Admitting: Internal Medicine

## 2013-09-07 ENCOUNTER — Ambulatory Visit (INDEPENDENT_AMBULATORY_CARE_PROVIDER_SITE_OTHER): Payer: Medicare HMO | Admitting: Internal Medicine

## 2013-09-07 VITALS — BP 103/67 | HR 70 | Temp 98.2°F | Ht 66.0 in | Wt 128.5 lb

## 2013-09-07 DIAGNOSIS — B20 Human immunodeficiency virus [HIV] disease: Secondary | ICD-10-CM

## 2013-09-07 DIAGNOSIS — Z23 Encounter for immunization: Secondary | ICD-10-CM

## 2013-09-07 LAB — RPR

## 2013-09-07 MED ORDER — ATAZANAVIR-COBICISTAT 300-150 MG PO TABS: 1.0000 | ORAL_TABLET | Freq: Every day | ORAL | Status: DC

## 2013-09-07 NOTE — Progress Notes (Signed)
Patient ID: [name] [name], male   DOB: October 23, 1945, 68 y.o.   MRN: [id number] @LOGODEPT         Patient Active Problem List   Diagnosis Date Noted  . Renal insufficiency 01/31/2011    Priority: High  . Dyslipidemia 01/31/2011    Priority: High  . HIV INFECTION 10/27/2008    Priority: High  . CIGARETTE SMOKER 10/27/2008    Priority: High  . 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 Dry mouth 03/25/2011  . 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 OTHER&UNSPECIFIED DISEASES THE ORAL SOFT TISSUES 07/04/2010  . [place] DISEASE 07/04/2010  . ARTHRALGIA 12/26/2009  . LOW BACK PAIN, ACUTE 09/12/2009  . DENTAL CARIES 10/27/2008  . SHINGLES, HX OF 10/27/2008    Patient's Medications  New Prescriptions   ATAZANAVIR-COBICISTAT (EVOTAZ) 300-150 MG PER TABLET    Take 1 tablet by mouth daily. Swallow whole. Do NOT crush, cut or chew tablet. Take with food.  Previous Medications   CHOLECALCIFEROL (VITAMIN D3) 2000 UNITS CAPSULE    Take 2,000 Units by mouth daily.   FISH OIL-OMEGA-3 FATTY ACIDS 1000 MG CAPSULE    Take 1 capsule (1 g total) by mouth 2 (two) times daily.   LAMIVUDINE (EPIVIR) 150 MG TABLET    Take 1 tablet (150 mg total) by mouth daily.   MULTIPLE VITAMINS-MINERALS (CENTRUM SILVER PO)    Take 1 tablet by mouth daily.     ROSUVASTATIN (CRESTOR) 5 MG TABLET    Take 1 tablet (5 mg total) by mouth daily.   ZIDOVUDINE (RETROVIR) 300 MG TABLET    Take 1 tablet (300 mg total) by mouth 2 (two) times daily.  Modified Medications   No medications on file  Discontinued Medications   ATAZANAVIR (REYATAZ) 300 MG CAPSULE    Take 1 capsule (300 mg total) by mouth daily with breakfast. With norvir   REYATAZ 300 MG CAPSULE    TAKE 1 CAPSULE BY MOUTH DAILY WITH BREAKFAST AND WITH NORVIR   RITONAVIR (NORVIR) 100 MG TABS TABLET    Take 1 tablet (100 mg total) by mouth daily.    Subjective: [name] [name] is in for his routine visit. He has not missed any doses of his medications. He  has cut down on his cigarettes but has no current plans to quit completely. His mom lives in [place] living in skilled nursing facility after a recent fall. He is planning on bringing her back home with hospice care. Review of Systems: Pertinent items are noted in HPI.  No past medical history on file.  History  Substance Use Topics  . Smoking status: Current Every Day Smoker -- 1.00 packs/day    Types: Cigarettes  . Smokeless tobacco: Never Used     Comment: stopped Wellbutrin  . Alcohol Use: 0.5 oz/week    1 drink(s) per week    No family history on file.  No Known Allergies  Objective: Temp: 98.2 F (36.8 C) (04/21 1000) Temp src: Oral (04/21 1000) BP: 103/67 mmHg (04/21 1000) Pulse Rate: 70 (04/21 1000) Body mass index is 20.75 kg/(m^2).  General: He is in good spirits and in no distress Oral: No oropharyngeal lesions. He does have his dentures in Skin: No rash  Lungs: Clear Cor: Regular S1 and S2 with no murmurs  Lab Results Lab Results  Component Value Date   WBC 5.3 05/25/2013   HGB 13.3 05/25/2013   HCT 37.4* 05/25/2013   MCV 118.7* 05/25/2013   PLT 252 05/25/2013  Lab Results  Component Value Date   CREATININE 1.89* 05/25/2013   BUN 23 05/25/2013   NA 136 05/25/2013   K 4.4 05/25/2013   CL 101 05/25/2013   CO2 26 05/25/2013    Lab Results  Component Value Date   ALT 13 05/25/2013   AST 15 05/25/2013   ALKPHOS 141* 05/25/2013   BILITOT 2.4* 05/25/2013    Lab Results  Component Value Date   CHOL 171 05/25/2013   HDL 40 05/25/2013   LDLCALC 64 05/25/2013   LDLDIRECT 126* 08/30/2011   TRIG 333* 05/25/2013   CHOLHDL 4.3 05/25/2013    Lab Results HIV 1 RNA Quant (copies/mL)  Date Value  05/25/2013 <20   12/04/2012 <20   07/17/2012 <20      HIV-1 RNA Viral Load (no units)  Date Value  02/05/2012 <40   11/06/2011 <40   08/30/2011 <40      CD4 T Cell Abs (/uL)  Date Value  05/25/2013 310*  12/04/2012 220*  07/17/2012 220*     Assessment: His HIV infection is under good  control he is having slow but steady CD4 reconstitution over the past 5 years. His total and LDL cholesterol are better but his triglycerides are elevated. I have encouraged him to consider quitting cigarettes completely.  Plan: 1. Simplify antiretroviral regimen to Retrovir, Epivir and Evotaz 2. Followup after lab work in [place] months   Michel Bickers, MD [facility] for [place] [phone] pager   [phone] cell 09/07/2013, 10:18 AM

## 2013-09-09 ENCOUNTER — Other Ambulatory Visit: Payer: Self-pay | Admitting: *Deleted

## 2013-09-09 DIAGNOSIS — E785 Hyperlipidemia, unspecified: Secondary | ICD-10-CM

## 2013-09-09 MED ORDER — ROSUVASTATIN CALCIUM 5 MG PO TABS: 5.0000 mg | ORAL_TABLET | Freq: Every day | ORAL | Status: AC

## 2013-11-17 ENCOUNTER — Ambulatory Visit: Payer: Medicare HMO

## 2013-11-22 ENCOUNTER — Other Ambulatory Visit: Payer: Self-pay | Admitting: *Deleted

## 2013-11-22 DIAGNOSIS — B20 Human immunodeficiency virus [HIV] disease: Secondary | ICD-10-CM

## 2013-11-22 MED ORDER — ATAZANAVIR-COBICISTAT 300-150 MG PO TABS: 1.0000 | ORAL_TABLET | Freq: Every day | ORAL | Status: DC

## 2013-11-22 MED ORDER — LAMIVUDINE 150 MG PO TABS: 150.0000 mg | ORAL_TABLET | Freq: Every day | ORAL | Status: DC

## 2013-11-22 MED ORDER — ZIDOVUDINE 300 MG PO TABS: 300.0000 mg | ORAL_TABLET | Freq: Two times a day (BID) | ORAL | Status: DC

## 2013-11-22 NOTE — Progress Notes (Signed)
ADAP renewal

## 2014-01-18 ENCOUNTER — Other Ambulatory Visit: Payer: Self-pay | Admitting: Internal Medicine

## 2014-01-18 ENCOUNTER — Ambulatory Visit (INDEPENDENT_AMBULATORY_CARE_PROVIDER_SITE_OTHER): Payer: Commercial Managed Care - HMO | Admitting: *Deleted

## 2014-01-18 ENCOUNTER — Other Ambulatory Visit: Payer: Commercial Managed Care - HMO

## 2014-01-18 ENCOUNTER — Encounter: Payer: Self-pay | Admitting: *Deleted

## 2014-01-18 VITALS — BP 103/64 | HR 72 | Temp 97.9°F | Resp 16 | Ht 65.5 in | Wt 123.5 lb

## 2014-01-18 DIAGNOSIS — B20 Human immunodeficiency virus [HIV] disease: Secondary | ICD-10-CM

## 2014-01-18 DIAGNOSIS — Z006 Encounter for examination for normal comparison and control in clinical research program: Secondary | ICD-10-CM

## 2014-01-18 LAB — CBC WITH DIFFERENTIAL/PLATELET
Basophils Absolute: 0.1 10*3/uL (ref 0.0–0.1)
Basophils Relative: 1 % (ref 0–1)
Eosinophils Absolute: 0.3 10*3/uL (ref 0.0–0.7)
Eosinophils Relative: 5 % (ref 0–5)
HCT: 36.4 % — ABNORMAL LOW (ref 39.0–52.0)
Hemoglobin: 12.9 g/dL — ABNORMAL LOW (ref 13.0–17.0)
LYMPHS ABS: 2.3 10*3/uL (ref 0.7–4.0)
LYMPHS PCT: 37 % (ref 12–46)
MCH: 42 pg — ABNORMAL HIGH (ref 26.0–34.0)
MCHC: 35.4 g/dL (ref 30.0–36.0)
MCV: 118.6 fL — ABNORMAL HIGH (ref 78.0–100.0)
Monocytes Absolute: 0.4 10*3/uL (ref 0.1–1.0)
Monocytes Relative: 7 % (ref 3–12)
NEUTROS PCT: 50 % (ref 43–77)
Neutro Abs: 3.2 10*3/uL (ref 1.7–7.7)
PLATELETS: 258 10*3/uL (ref 150–400)
RBC: 3.07 MIL/uL — AB (ref 4.22–5.81)
RDW: 13.6 % (ref 11.5–15.5)
WBC: 6.3 10*3/uL (ref 4.0–10.5)

## 2014-01-18 LAB — COMPREHENSIVE METABOLIC PANEL
ALBUMIN: 3.9 g/dL (ref 3.5–5.2)
ALT: 22 U/L (ref 0–53)
AST: 18 U/L (ref 0–37)
Alkaline Phosphatase: 124 U/L — ABNORMAL HIGH (ref 39–117)
BUN: 28 mg/dL — ABNORMAL HIGH (ref 6–23)
CALCIUM: 9.1 mg/dL (ref 8.4–10.5)
CHLORIDE: 102 meq/L (ref 96–112)
CO2: 24 mEq/L (ref 19–32)
Creat: 2.2 mg/dL — ABNORMAL HIGH (ref 0.50–1.35)
Glucose, Bld: 91 mg/dL (ref 70–99)
POTASSIUM: 4.6 meq/L (ref 3.5–5.3)
SODIUM: 136 meq/L (ref 135–145)
Total Bilirubin: 2.5 mg/dL — ABNORMAL HIGH (ref 0.2–1.2)
Total Protein: 6.9 g/dL (ref 6.0–8.3)

## 2014-01-18 LAB — LIPID PANEL
CHOL/HDL RATIO: 4.6 ratio
Cholesterol: 174 mg/dL (ref 0–200)
HDL: 38 mg/dL — ABNORMAL LOW (ref >39)
LDL Cholesterol: 77 mg/dL (ref 0–99)
Triglycerides: 294 mg/dL — ABNORMAL HIGH (ref <150)
VLDL: 59 mg/dL — ABNORMAL HIGH (ref 0–40)

## 2014-01-18 NOTE — Progress Notes (Signed)
[name] [name] is here for A5322 study, week 77. He has had an unintentional weight loss of 5.5 lbs. He states his appetite is poor. He eats once per day and can only eat half a hamburger for example. He also mentioned that his dentures do not fit as well as they use to because of his weight loss....and this stops him from enjoying steaks. His physical activity has declined. He states he is usually sitting in the recliner and the only time he walks is to the mailbox. He lost his mother (whom he was living with and taking care of) around April 2015. He has no immediate family/friends around him for support. One thing he enjoys is the [place] cruise that takes place in November of each year.  He knows that he is going through some depression with the loss of his mother and that directly affecting his daily activities. He is interested in what options there are to helps increase his appetite and physical activity. He wanted to discuss all this with Dr. Megan Salon in October to go over best options for him. I mentioned some options: - I mentioned drinking ensure throughout the day. He wanted to start this. - I discussed options such as medications to increase appetite, anti-dreppressants to help his overall mood which could increase his appetite. He was interested. - Seeing a counselor to get support he is lacking. He quickly dismissed the idea of seeing a counselor since he has tried this in the past and was not a good fit for him.  - See dental clinic (who made his dentures) to discuss options with his dentures. He wanted to go to [facility] and purchase some "waxy grip" that worked with his dentures before scheduling an appointment with Dental Clinic. He mentioned rods that could be implanted into the gums in which dentures would fit over but the cost is $2,500. - Walking for a total of 30 min/day in which exercise elevates mood. He wants to but does not have the motivation. - [place]iting higher ground to see if that type of support  would benefit him. He states he is more of an introvert. - He did start attending our [employer] Therapist, music) meeting that occur once every couple months.   Fasting blood was drawn and vital are stable. He tried Gabapentin for neuropathy in his legs, for 4 months, and stopped since it did not help him. He wants to discuss other options for this with Dr. Megan Salon.  He received $50 gift card for visit and next appointment scheduled for May 24, 2014 @ 9:30am. [name] RN

## 2014-01-19 LAB — T-HELPER CELL (CD4) - (RCID CLINIC ONLY)
CD4 % Helper T Cell: 13 % — ABNORMAL LOW (ref 33–55)
CD4 T Cell Abs: 270 /uL — ABNORMAL LOW (ref 400–2700)

## 2014-01-22 LAB — HLA B*5701: HLA-B 5701 W/RFLX HLA-B HIGH: NEGATIVE

## 2014-01-26 LAB — GC/CHLAMYDIA PROBE AMP
CT Probe RNA: NEGATIVE
GC PROBE AMP APTIMA: NEGATIVE

## 2014-02-24 ENCOUNTER — Encounter: Payer: Self-pay | Admitting: Internal Medicine

## 2014-02-24 LAB — HIV-1 RNA QUANT-NO REFLEX-BLD: HIV-1 RNA Viral Load: <40

## 2014-02-25 ENCOUNTER — Telehealth: Payer: Self-pay | Admitting: *Deleted

## 2014-02-25 NOTE — Telephone Encounter (Signed)
[name] [name] called and said he got his flushot 10/1 at his primary care office and she also started him on wellbutrin 150mg  daily.

## 2014-03-09 ENCOUNTER — Encounter: Payer: Self-pay | Admitting: Internal Medicine

## 2014-03-09 ENCOUNTER — Ambulatory Visit (INDEPENDENT_AMBULATORY_CARE_PROVIDER_SITE_OTHER): Payer: Commercial Managed Care - HMO | Admitting: Internal Medicine

## 2014-03-09 VITALS — BP 112/70 | HR 94 | Temp 98.2°F | Wt 123.0 lb

## 2014-03-09 DIAGNOSIS — B20 Human immunodeficiency virus [HIV] disease: Secondary | ICD-10-CM

## 2014-03-09 NOTE — Progress Notes (Signed)
Patient ID: [name] [name], male   DOB: [birth year], 68 y.o.   MRN: [id number]          Patient Active Problem List   Diagnosis Date Noted  . Renal insufficiency 01/31/2011    Priority: High  . Dyslipidemia 01/31/2011    Priority: High  . HIV INFECTION 10/27/2008    Priority: High  . CIGARETTE SMOKER 10/27/2008    Priority: High  . 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 Dry mouth 03/25/2011  . 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 OTHER&UNSPECIFIED DISEASES THE ORAL SOFT TISSUES 07/04/2010  . [place] DISEASE 07/04/2010  . ARTHRALGIA 12/26/2009  . LOW BACK PAIN, ACUTE 09/12/2009  . DENTAL CARIES 10/27/2008  . SHINGLES, HX OF 10/27/2008    Patient's Medications  New Prescriptions   No medications on file  Previous Medications   ATAZANAVIR-COBICISTAT (EVOTAZ) 300-150 MG PER TABLET    Take 1 tablet by mouth daily. Swallow whole. Do NOT crush, cut or chew tablet. Take with food.   BUPROPION (WELLBUTRIN XL) 150 MG 24 HR TABLET    Take 150 mg by mouth daily.   CHOLECALCIFEROL (VITAMIN D3) 2000 UNITS CAPSULE    Take 2,000 Units by mouth daily.   FISH OIL-OMEGA-3 FATTY ACIDS 1000 MG CAPSULE    Take 1 capsule (1 g total) by mouth 2 (two) times daily.   LAMIVUDINE (EPIVIR) 150 MG TABLET    Take 1 tablet (150 mg total) by mouth daily.   MULTIPLE VITAMINS-MINERALS (CENTRUM SILVER PO)    Take 1 tablet by mouth daily.     ROSUVASTATIN (CRESTOR) 5 MG TABLET    Take 1 tablet (5 mg total) by mouth daily.   ZIDOVUDINE (RETROVIR) 300 MG TABLET    Take 1 tablet (300 mg total) by mouth 2 (two) times daily.  Modified Medications   No medications on file  Discontinued Medications   No medications on file    Subjective: [name] [name] is in for his routine visit. He denies missing any doses of his Epivir, Retrovir or Evotaz. He has been struggling with some depression since his mother died this past 08/31/2022. He states that he gets out of the house very little and has few social  contacts. He gets no regular exercise. He started on Wellbutrin recently and says that his grief reaction is slowly beginning to improve. He also has a plan to stop smoking cigarettes on November 7 weekend he does on his annual cruise.  Review of Systems: Constitutional: positive for anorexia and malaise, negative for chills, fevers, sweats and weight loss Eyes: negative Ears, nose, mouth, throat, and face: negative Respiratory: positive for dyspnea on exertion, negative for cough and sputum Cardiovascular: negative Gastrointestinal: negative Genitourinary:positive for nocturia, negative for decreased stream, dysuria, hesitancy and urinary incontinence  No past medical history on file.  History  Substance Use Topics  . Smoking status: Current Every Day Smoker -- 1.00 packs/day    Types: Cigarettes  . Smokeless tobacco: Never Used     Comment: stopped Wellbutrin  . Alcohol Use: 0.5 oz/week    1 drink(s) per week    No family history on file.  No Known Allergies  Objective: Temp: 98.2 F (36.8 C) (10/21 1333) Temp Source: Oral (10/21 1333) BP: 112/70 mmHg (10/21 1333) Pulse Rate: 94 (10/21 1333) Body mass index is 20.15 kg/(m^2).  General: He is in good spirits and in no distress Oral: No oropharyngeal lesions Skin: No rash Lungs: Clear Cor: Regular S1 and  S2 with no murmur Abdomen: Soft and nontender Mood and affect: Appropriate. He does not appear anxious or depressed  Lab Results Lab Results  Component Value Date   WBC 6.3 01/18/2014   HGB 12.9* 01/18/2014   HCT 36.4* 01/18/2014   MCV 118.6* 01/18/2014   PLT 258 01/18/2014    Lab Results  Component Value Date   CREATININE 2.20* 01/18/2014   BUN 28* 01/18/2014   NA 136 01/18/2014   K 4.6 01/18/2014   CL 102 01/18/2014   CO2 24 01/18/2014    Lab Results  Component Value Date   ALT 22 01/18/2014   AST 18 01/18/2014   ALKPHOS 124* 01/18/2014   BILITOT 2.5* 01/18/2014    Lab Results  Component Value Date   CHOL 174 01/18/2014   HDL  38* 01/18/2014   LDLCALC 77 01/18/2014   LDLDIRECT 126* 08/30/2011   TRIG 294* 01/18/2014   CHOLHDL 4.6 01/18/2014    Lab Results HIV 1 RNA Quant (copies/mL)  Date Value  05/25/2013 <20   12/04/2012 <20   07/17/2012 <20      HIV-1 RNA Viral Load (no units)  Date Value  01/18/2014 <40   02/05/2012 <40   11/06/2011 <40      CD4 (no units)  Date Value  02/05/2012 229   11/06/2011 179   08/30/2011 249      CD4 T Cell Abs (/uL)  Date Value  01/18/2014 270*  05/25/2013 310*  12/04/2012 220*     Assessment: His HIV infection remains under excellent control. I will continue his current regimen. His creatinine continues to slowly increase. Some of the recent bump may be related to the cobicistat component of Evotaz. However if his creatinine continues to climb I would recommend a nephrology evaluation. He's had some recent depression but it appears to be improving with time and Wellbutrin. I talked to him at length about the importance of following through on his plan to quit smoking cigarettes.  Plan: 1. Continue current antiretroviral regimen 2. Followup here after blood work in [place] months   [name] Bickers, MD [facility] [place] [phone] pager   [phone] cell 03/09/2014, 1:56 PM

## 2014-03-21 ENCOUNTER — Ambulatory Visit: Payer: Medicare HMO | Admitting: Internal Medicine

## 2014-04-20 ENCOUNTER — Ambulatory Visit (HOSPITAL_COMMUNITY)
Admission: RE | Admit: 2014-04-20 | Discharge: 2014-04-20 | Disposition: A | Discharge status: Home / Self Care | Payer: Commercial Managed Care - HMO | Source: Ambulatory Visit | Attending: Nurse Practitioner | Admitting: Nurse Practitioner

## 2014-04-20 ENCOUNTER — Ambulatory Visit (HOSPITAL_COMMUNITY)
Admission: RE | Admit: 2014-04-20 | Discharge: 2014-04-20 | Disposition: A | Discharge status: Home / Self Care | Payer: Commercial Managed Care - HMO | Source: Ambulatory Visit | Attending: Internal Medicine | Admitting: Internal Medicine

## 2014-04-20 ENCOUNTER — Other Ambulatory Visit (HOSPITAL_COMMUNITY): Payer: Self-pay | Admitting: Internal Medicine

## 2014-04-20 ENCOUNTER — Other Ambulatory Visit (HOSPITAL_COMMUNITY): Payer: Self-pay | Admitting: Nurse Practitioner

## 2014-04-20 ENCOUNTER — Ambulatory Visit (HOSPITAL_COMMUNITY): Payer: Commercial Managed Care - HMO

## 2014-04-20 ENCOUNTER — Ambulatory Visit (HOSPITAL_COMMUNITY): Admission: RE | Admit: 2014-04-20 | Payer: Medicare HMO | Source: Ambulatory Visit

## 2014-04-20 DIAGNOSIS — R52 Pain, unspecified: Secondary | ICD-10-CM

## 2014-04-20 DIAGNOSIS — R109 Unspecified abdominal pain: Secondary | ICD-10-CM

## 2014-04-20 DIAGNOSIS — J9811 Atelectasis: Secondary | ICD-10-CM | POA: Diagnosis not present

## 2014-04-20 DIAGNOSIS — J90 Pleural effusion, not elsewhere classified: Secondary | ICD-10-CM | POA: Insufficient documentation

## 2014-04-20 DIAGNOSIS — K8020 Calculus of gallbladder without cholecystitis without obstruction: Secondary | ICD-10-CM | POA: Diagnosis not present

## 2014-04-20 DIAGNOSIS — J984 Other disorders of lung: Secondary | ICD-10-CM | POA: Insufficient documentation

## 2014-04-20 DIAGNOSIS — R0602 Shortness of breath: Secondary | ICD-10-CM

## 2014-04-21 ENCOUNTER — Ambulatory Visit (HOSPITAL_COMMUNITY): Payer: Medicare HMO

## 2014-04-21 ENCOUNTER — Other Ambulatory Visit (INDEPENDENT_AMBULATORY_CARE_PROVIDER_SITE_OTHER): Payer: Commercial Managed Care - HMO

## 2014-04-21 ENCOUNTER — Ambulatory Visit (INDEPENDENT_AMBULATORY_CARE_PROVIDER_SITE_OTHER): Payer: Commercial Managed Care - HMO | Admitting: Internal Medicine

## 2014-04-21 ENCOUNTER — Encounter: Payer: Self-pay | Admitting: Internal Medicine

## 2014-04-21 VITALS — BP 98/66 | HR 116 | Ht 66.0 in | Wt 122.4 lb

## 2014-04-21 DIAGNOSIS — Z720 Tobacco use: Secondary | ICD-10-CM

## 2014-04-21 DIAGNOSIS — J90 Pleural effusion, not elsewhere classified: Secondary | ICD-10-CM

## 2014-04-21 DIAGNOSIS — N289 Disorder of kidney and ureter, unspecified: Secondary | ICD-10-CM

## 2014-04-21 DIAGNOSIS — F17200 Nicotine dependence, unspecified, uncomplicated: Secondary | ICD-10-CM

## 2014-04-21 DIAGNOSIS — J948 Other specified pleural conditions: Secondary | ICD-10-CM

## 2014-04-21 LAB — CBC WITH DIFFERENTIAL/PLATELET
BASOS PCT: 0.4 % (ref 0.0–3.0)
Basophils Absolute: 0 10*3/uL (ref 0.0–0.1)
EOS ABS: 0.2 10*3/uL (ref 0.0–0.7)
Eosinophils Relative: 2.3 % (ref 0.0–5.0)
HEMATOCRIT: 40.8 % (ref 39.0–52.0)
HEMOGLOBIN: 13.4 g/dL (ref 13.0–17.0)
Lymphocytes Relative: 23.3 % (ref 12.0–46.0)
Lymphs Abs: 2.1 10*3/uL (ref 0.7–4.0)
MCHC: 32.8 g/dL (ref 30.0–36.0)
MCV: 128.2 fl — AB (ref 78.0–100.0)
MONO ABS: 0.7 10*3/uL (ref 0.1–1.0)
Monocytes Relative: 7.5 % (ref 3.0–12.0)
NEUTROS ABS: 6.1 10*3/uL (ref 1.4–7.7)
Neutrophils Relative %: 66.5 % (ref 43.0–77.0)
Platelets: 388 10*3/uL (ref 150.0–400.0)
RBC: 3.18 Mil/uL — AB (ref 4.22–5.81)
RDW: 13.5 % (ref 11.5–15.5)
WBC: 9.1 10*3/uL (ref 4.0–10.5)

## 2014-04-21 LAB — BASIC METABOLIC PANEL
BUN: 36 mg/dL — ABNORMAL HIGH (ref 6–23)
CHLORIDE: 101 meq/L (ref 96–112)
CO2: 21 meq/L (ref 19–32)
Calcium: 8.9 mg/dL (ref 8.4–10.5)
Creatinine, Ser: 3.3 mg/dL — ABNORMAL HIGH (ref 0.4–1.5)
GFR: 20 mL/min — ABNORMAL LOW (ref >60.00)
Glucose, Bld: 122 mg/dL — ABNORMAL HIGH (ref 70–99)
POTASSIUM: 4.5 meq/L (ref 3.5–5.1)
SODIUM: 136 meq/L (ref 135–145)

## 2014-04-21 LAB — SEDIMENTATION RATE: Sed Rate: 96 mm/hr — ABNORMAL HIGH (ref 0–22)

## 2014-04-21 LAB — BRAIN NATRIURETIC PEPTIDE: Pro B Natriuretic peptide (BNP): 25 pg/mL (ref 0.0–100.0)

## 2014-04-21 LAB — TSH: TSH: 2.91 u[IU]/mL (ref 0.35–4.50)

## 2014-04-21 MED ORDER — HYDROCODONE-ACETAMINOPHEN 5-325 MG PO TABS: 1.0000 | ORAL_TABLET | Freq: Four times a day (QID) | ORAL | Status: DC | PRN

## 2014-04-21 NOTE — Patient Instructions (Addendum)
Please see patient coordinator before you leave today  to schedule u/s thoracentesis then CT chest to follow same day  Please remember to go to the lab   department downstairs for your tests - we will call you with the results when they are available.    Late add:  Add creatinine to labs for thoracentesis

## 2014-04-21 NOTE — Assessment & Plan Note (Signed)
Strongly advised to consider committing to quitting

## 2014-04-21 NOTE — Progress Notes (Signed)
Subjective:     Patient ID: [name] [name], male   DOB: December 16, 1945,    MRN: [id number]  HPI  16 yowm smoker dx HIV 2010 with undetectable virus since 2012 but CD4 around 250-300 and referred to pulmonary clinic 04/21/2014 by Dr Kelton Pillar for eval of R effusion.   04/21/2014 1st [facility] office visit/ [name]   Chief Complaint  Patient presents with  . Follow-up    pain on right side of chest for a week. SOB   acute onset R flank pain one week prior to OV  With chronic am mucus x sev tsp usually  clear then x 2 days  Began to get  gagged on cough and vomits  Pain worse with coughing or lying L side down. No hemoptysis  Sob only with > adls, not at rest or supine  No obvious day to day or daytime variabilty or assoc    chest tightness, subjective wheeze overt sinus or hb symptoms. No unusual exp hx or h/o childhood pna/ asthma or knowledge of premature birth.  Sleeping ok without nocturnal  or early am exacerbation  of respiratory  c/o's or need for noct saba. Also denies any obvious fluctuation of symptoms with weather or environmental changes or other aggravating or alleviating factors except as outlined above   Current Medications, Allergies, Complete Past Medical History, Past Surgical History, Family History, and Social History were reviewed in [employer] record.  ROS  The following are not active complaints unless bolded sore throat, dysphagia, dental problems, itching, sneezing,  nasal congestion or excess/ purulent secretions, ear ache,   fever, chills, sweats, unintended wt loss,  exertional cp, hemoptysis,  orthopnea pnd or leg swelling, presyncope, palpitations, heartburn, abdominal pain, anorexia, nausea, vomiting, diarrhea  or change in bowel or urinary habits, change in stools or urine, dysuria,hematuria,  rash, arthralgias, visual complaints, headache, numbness weakness or ataxia or problems with walking or coordination,  change in mood/affect or  memory.         Review of Systems     Objective:   Physical Exam    Edentulous amb wm nad  Wt Readings from Last 3 Encounters:  04/21/14 122 lb 6.4 oz (55.52 kg)  03/09/14 123 lb (55.792 kg)  01/18/14 123 lb 8 oz (56.019 kg)    Vital signs reviewed  HEENT: nl  turbinates, and orophanx. Nl external ear canals without cough reflex   NECK :  without JVD/Nodes/TM/ nl carotid upstrokes bilaterally   LUNGS: no acc muscle use,   Decreased bs in R base with dullness   CV:  RRR  no s3 or murmur or increase in P2, no edema   ABD:  soft and nontender with nl excursion in the supine position. No bruits or organomegaly, bowel sounds nl  MS:  warm without deformities, calf tenderness, cyanosis - mild to mod clubbing both UE's SKIN: warm and dry without lesions    NEURO:  alert, approp, no deficits    cxr 04/20/14 Moderate size right pleural effusion with right basilar atelectasis.   Recent Labs Lab 04/21/14 1131  NA 136  K 4.5  CL 101  CO2 21  BUN 36*  CREATININE 3.3*  GLUCOSE 122*    Recent Labs Lab 04/21/14 1131  HGB 13.4  HCT 40.8  WBC 9.1  PLT 388.0     Lab Results  Component Value Date   TSH 2.91 04/21/2014     Lab Results  Component Value Date   PROBNP  25.0 04/21/2014     Lab Results  Component Value Date   ESRSEDRATE 96* 04/21/2014          Assessment:

## 2014-04-21 NOTE — Assessment & Plan Note (Signed)
Assoc with pain but no fever in pt with clubbing is very worrisome for underlying lung ca so needs first tap dry then f/u with non-contrasted ct as has CRI   Discussed in detail all the  indications, usual  risks and alternatives  relative to the benefits with patient who agrees to proceed with thoracentesis followed by CT   In meantime rx with norco 5 every 4 hours prn

## 2014-04-22 ENCOUNTER — Telehealth: Payer: Self-pay | Admitting: Internal Medicine

## 2014-04-22 NOTE — Telephone Encounter (Signed)
Pt states that he is in a lot of pain, wants to know if we can just send his hydrocodone rx to pharmacy for his neighbor to pick up for him.  He has the hard copy of rx given to him yesterday.  I advised him that we are unable to call this medication in, he needs to take the hard copy to the pharmacy to get rx filled.  He is aware.  Nothing further needed.

## 2014-04-24 NOTE — Assessment & Plan Note (Signed)
Lab Results  Component Value Date   CREATININE 3.3* 04/21/2014   CREATININE 2.20* 01/18/2014   CREATININE 1.89* 05/25/2013   CREATININE 1.65* 12/04/2012   CREATININE 1.24 06/05/2010   CREATININE 0.99 04/02/2010     Worsening renal fxn ? Etiology but doubt related to painful unilaterl effusion Will add creatinine level to labs

## 2014-04-25 ENCOUNTER — Telehealth: Payer: Self-pay | Admitting: *Deleted

## 2014-04-25 ENCOUNTER — Ambulatory Visit (HOSPITAL_COMMUNITY)
Admission: RE | Admit: 2014-04-25 | Discharge: 2014-04-25 | Disposition: A | Discharge status: Home / Self Care | Payer: Commercial Managed Care - HMO | Source: Ambulatory Visit | Attending: Internal Medicine | Admitting: Internal Medicine

## 2014-04-25 ENCOUNTER — Ambulatory Visit (HOSPITAL_COMMUNITY)
Admission: RE | Admit: 2014-04-25 | Discharge: 2014-04-25 | Disposition: A | Discharge status: Home / Self Care | Payer: Medicare HMO | Source: Ambulatory Visit | Attending: Radiology | Admitting: Radiology

## 2014-04-25 ENCOUNTER — Ambulatory Visit (HOSPITAL_COMMUNITY)
Admission: RE | Admit: 2014-04-25 | Discharge: 2014-04-25 | Disposition: A | Discharge status: Home / Self Care | Payer: Medicare HMO | Source: Ambulatory Visit | Attending: Internal Medicine | Admitting: Internal Medicine

## 2014-04-25 DIAGNOSIS — J90 Pleural effusion, not elsewhere classified: Secondary | ICD-10-CM

## 2014-04-25 HISTORY — PX: THORACENTESIS: SHX235

## 2014-04-25 LAB — LACTATE DEHYDROGENASE, PLEURAL OR PERITONEAL FLUID: LD, Fluid: 908 U/L — ABNORMAL HIGH (ref 3–23)

## 2014-04-25 LAB — BODY FLUID CELL COUNT WITH DIFFERENTIAL
Eos, Fluid: 11 %
LYMPHS FL: 50 %
MONOCYTE-MACROPHAGE-SEROUS FLUID: 15 % — AB (ref 50–90)
Neutrophil Count, Fluid: 22 % (ref 0–25)
Total Nucleated Cell Count, Fluid: 470 cu mm (ref 0–1000)

## 2014-04-25 LAB — GLUCOSE, SEROUS FLUID: Glucose, Fluid: 105 mg/dL

## 2014-04-25 LAB — PROTEIN, BODY FLUID: TOTAL PROTEIN, FLUID: 4.8 g/dL

## 2014-04-25 MED ORDER — LIDOCAINE HCL (PF) 1 % IJ SOLN
INTRAMUSCULAR | Status: AC
  Filled 2014-04-25: qty 10

## 2014-04-25 NOTE — Telephone Encounter (Signed)
-----   Message from Tanda Rockers, MD sent at 04/24/2014  8:58 AM EST ----- Add creatinine to labs for thoracentesis

## 2014-04-25 NOTE — Procedures (Signed)
  US guided R thoracentesis  900 cc dark blood tinged fluid Sent for labs  Pt tolerated well cxr pending

## 2014-04-27 ENCOUNTER — Other Ambulatory Visit: Payer: Self-pay | Admitting: Internal Medicine

## 2014-04-27 DIAGNOSIS — J90 Pleural effusion, not elsewhere classified: Secondary | ICD-10-CM

## 2014-04-27 NOTE — Progress Notes (Signed)
Quick Note:  Done- order sent to [facility] ______

## 2014-04-28 ENCOUNTER — Encounter (HOSPITAL_COMMUNITY): Payer: Self-pay | Admitting: *Deleted

## 2014-04-28 ENCOUNTER — Emergency Department (HOSPITAL_COMMUNITY): Payer: Commercial Managed Care - HMO

## 2014-04-28 ENCOUNTER — Inpatient Hospital Stay (HOSPITAL_COMMUNITY)
Admission: EM | Admit: 2014-04-28 | Discharge: 2014-05-23 | DRG: 166 | Disposition: A | Discharge status: Skilled Nursing Facility | Payer: Commercial Managed Care - HMO | Attending: Internal Medicine | Admitting: Internal Medicine

## 2014-04-28 DIAGNOSIS — K8020 Calculus of gallbladder without cholecystitis without obstruction: Secondary | ICD-10-CM | POA: Diagnosis present

## 2014-04-28 DIAGNOSIS — E43 Unspecified severe protein-calorie malnutrition: Secondary | ICD-10-CM | POA: Insufficient documentation

## 2014-04-28 DIAGNOSIS — N179 Acute kidney failure, unspecified: Secondary | ICD-10-CM | POA: Diagnosis present

## 2014-04-28 DIAGNOSIS — K922 Gastrointestinal hemorrhage, unspecified: Secondary | ICD-10-CM | POA: Diagnosis present

## 2014-04-28 DIAGNOSIS — K5900 Constipation, unspecified: Secondary | ICD-10-CM | POA: Diagnosis present

## 2014-04-28 DIAGNOSIS — N183 Chronic kidney disease, stage 3 (moderate): Secondary | ICD-10-CM | POA: Diagnosis present

## 2014-04-28 DIAGNOSIS — J189 Pneumonia, unspecified organism: Secondary | ICD-10-CM | POA: Diagnosis not present

## 2014-04-28 DIAGNOSIS — J811 Chronic pulmonary edema: Secondary | ICD-10-CM

## 2014-04-28 DIAGNOSIS — R578 Other shock: Secondary | ICD-10-CM | POA: Diagnosis not present

## 2014-04-28 DIAGNOSIS — J869 Pyothorax without fistula: Secondary | ICD-10-CM | POA: Diagnosis present

## 2014-04-28 DIAGNOSIS — J949 Pleural condition, unspecified: Secondary | ICD-10-CM

## 2014-04-28 DIAGNOSIS — R579 Shock, unspecified: Secondary | ICD-10-CM | POA: Diagnosis not present

## 2014-04-28 DIAGNOSIS — Z720 Tobacco use: Secondary | ICD-10-CM

## 2014-04-28 DIAGNOSIS — Z79899 Other long term (current) drug therapy: Secondary | ICD-10-CM | POA: Diagnosis not present

## 2014-04-28 DIAGNOSIS — K264 Chronic or unspecified duodenal ulcer with hemorrhage: Secondary | ICD-10-CM | POA: Diagnosis present

## 2014-04-28 DIAGNOSIS — J940 Chylous effusion: Secondary | ICD-10-CM | POA: Diagnosis not present

## 2014-04-28 DIAGNOSIS — A419 Sepsis, unspecified organism: Secondary | ICD-10-CM | POA: Diagnosis not present

## 2014-04-28 DIAGNOSIS — J939 Pneumothorax, unspecified: Secondary | ICD-10-CM

## 2014-04-28 DIAGNOSIS — Y95 Nosocomial condition: Secondary | ICD-10-CM | POA: Diagnosis not present

## 2014-04-28 DIAGNOSIS — R0602 Shortness of breath: Secondary | ICD-10-CM | POA: Insufficient documentation

## 2014-04-28 DIAGNOSIS — C3431 Malignant neoplasm of lower lobe, right bronchus or lung: Principal | ICD-10-CM | POA: Diagnosis present

## 2014-04-28 DIAGNOSIS — N19 Unspecified kidney failure: Secondary | ICD-10-CM | POA: Diagnosis present

## 2014-04-28 DIAGNOSIS — B20 Human immunodeficiency virus [HIV] disease: Secondary | ICD-10-CM | POA: Diagnosis present

## 2014-04-28 DIAGNOSIS — Z681 Body mass index (BMI) 19 or less, adult: Secondary | ICD-10-CM | POA: Diagnosis not present

## 2014-04-28 DIAGNOSIS — R1084 Generalized abdominal pain: Secondary | ICD-10-CM

## 2014-04-28 DIAGNOSIS — J90 Pleural effusion, not elsewhere classified: Secondary | ICD-10-CM | POA: Diagnosis present

## 2014-04-28 DIAGNOSIS — R109 Unspecified abdominal pain: Secondary | ICD-10-CM | POA: Insufficient documentation

## 2014-04-28 DIAGNOSIS — D62 Acute posthemorrhagic anemia: Secondary | ICD-10-CM | POA: Diagnosis not present

## 2014-04-28 DIAGNOSIS — E875 Hyperkalemia: Secondary | ICD-10-CM | POA: Diagnosis present

## 2014-04-28 DIAGNOSIS — R079 Chest pain, unspecified: Secondary | ICD-10-CM

## 2014-04-28 DIAGNOSIS — J910 Malignant pleural effusion: Secondary | ICD-10-CM | POA: Diagnosis present

## 2014-04-28 DIAGNOSIS — C3490 Malignant neoplasm of unspecified part of unspecified bronchus or lung: Secondary | ICD-10-CM

## 2014-04-28 DIAGNOSIS — Z09 Encounter for follow-up examination after completed treatment for conditions other than malignant neoplasm: Secondary | ICD-10-CM

## 2014-04-28 DIAGNOSIS — D539 Nutritional anemia, unspecified: Secondary | ICD-10-CM | POA: Diagnosis present

## 2014-04-28 DIAGNOSIS — J9601 Acute respiratory failure with hypoxia: Secondary | ICD-10-CM | POA: Insufficient documentation

## 2014-04-28 DIAGNOSIS — Z9689 Presence of other specified functional implants: Secondary | ICD-10-CM

## 2014-04-28 DIAGNOSIS — R52 Pain, unspecified: Secondary | ICD-10-CM

## 2014-04-28 HISTORY — DX: Depression, unspecified: F32.A

## 2014-04-28 HISTORY — DX: Pure hypercholesterolemia, unspecified: E78.00

## 2014-04-28 HISTORY — DX: Asymptomatic human immunodeficiency virus (hiv) infection status: Z21

## 2014-04-28 HISTORY — DX: Major depressive disorder, single episode, unspecified: F32.9

## 2014-04-28 HISTORY — DX: Human immunodeficiency virus (HIV) disease: B20

## 2014-04-28 HISTORY — DX: Chronic kidney disease, unspecified: N18.9

## 2014-04-28 HISTORY — DX: Pleural effusion, not elsewhere classified: J90

## 2014-04-28 HISTORY — DX: Polyneuropathy, unspecified: G62.9

## 2014-04-28 HISTORY — DX: Hemorrhage of anus and rectum: K62.5

## 2014-04-28 LAB — CBC
HCT: 29.4 % — ABNORMAL LOW (ref 39.0–52.0)
HEMOGLOBIN: 9.9 g/dL — AB (ref 13.0–17.0)
MCH: 42.1 pg — AB (ref 26.0–34.0)
MCHC: 33.7 g/dL (ref 30.0–36.0)
MCV: 125.1 fL — ABNORMAL HIGH (ref 78.0–100.0)
Platelets: 345 10*3/uL (ref 150–400)
RBC: 2.35 MIL/uL — ABNORMAL LOW (ref 4.22–5.81)
RDW: 12.1 % (ref 11.5–15.5)
WBC: 6.7 10*3/uL (ref 4.0–10.5)

## 2014-04-28 LAB — CBC WITH DIFFERENTIAL/PLATELET
Basophils Absolute: 0 10*3/uL (ref 0.0–0.1)
Basophils Relative: 0 % (ref 0–1)
EOS PCT: 1 % (ref 0–5)
Eosinophils Absolute: 0.1 10*3/uL (ref 0.0–0.7)
HEMATOCRIT: 32.8 % — AB (ref 39.0–52.0)
Hemoglobin: 11.1 g/dL — ABNORMAL LOW (ref 13.0–17.0)
LYMPHS ABS: 1.5 10*3/uL (ref 0.7–4.0)
Lymphocytes Relative: 17 % (ref 12–46)
MCH: 41.7 pg — AB (ref 26.0–34.0)
MCHC: 33.8 g/dL (ref 30.0–36.0)
MCV: 123.3 fL — AB (ref 78.0–100.0)
MONOS PCT: 8 % (ref 3–12)
Monocytes Absolute: 0.7 10*3/uL (ref 0.1–1.0)
NEUTROS ABS: 6.6 10*3/uL (ref 1.7–7.7)
Neutrophils Relative %: 74 % (ref 43–77)
Platelets: 348 10*3/uL (ref 150–400)
RBC: 2.66 MIL/uL — AB (ref 4.22–5.81)
RDW: 11.8 % (ref 11.5–15.5)
WBC: 8.9 10*3/uL (ref 4.0–10.5)

## 2014-04-28 LAB — CREATININE, SERUM
CREATININE: 3.23 mg/dL — AB (ref 0.50–1.35)
GFR calc Af Amer: 21 mL/min — ABNORMAL LOW (ref >90)
GFR calc non Af Amer: 19 mL/min — ABNORMAL LOW (ref >90)

## 2014-04-28 LAB — URINALYSIS, ROUTINE W REFLEX MICROSCOPIC
Bilirubin Urine: NEGATIVE
GLUCOSE, UA: NEGATIVE mg/dL
Ketones, ur: NEGATIVE mg/dL
Nitrite: NEGATIVE
PH: 6 (ref 5.0–8.0)
Protein, ur: 30 mg/dL — AB
Specific Gravity, Urine: 1.014 (ref 1.005–1.030)
Urobilinogen, UA: 0.2 mg/dL (ref 0.0–1.0)

## 2014-04-28 LAB — COMPREHENSIVE METABOLIC PANEL
ALT: 16 U/L (ref 0–53)
AST: 16 U/L (ref 0–37)
Albumin: 2.2 g/dL — ABNORMAL LOW (ref 3.5–5.2)
Alkaline Phosphatase: 84 U/L (ref 39–117)
Anion gap: 16 — ABNORMAL HIGH (ref 5–15)
BUN: 75 mg/dL — ABNORMAL HIGH (ref 6–23)
CALCIUM: 9 mg/dL (ref 8.4–10.5)
CO2: 22 meq/L (ref 19–32)
Chloride: 96 mEq/L (ref 96–112)
Creatinine, Ser: 3.42 mg/dL — ABNORMAL HIGH (ref 0.50–1.35)
GFR calc Af Amer: 20 mL/min — ABNORMAL LOW (ref >90)
GFR, EST NON AFRICAN AMERICAN: 17 mL/min — AB (ref >90)
Glucose, Bld: 126 mg/dL — ABNORMAL HIGH (ref 70–99)
Potassium: 5.7 mEq/L — ABNORMAL HIGH (ref 3.7–5.3)
SODIUM: 134 meq/L — AB (ref 137–147)
TOTAL PROTEIN: 6.8 g/dL (ref 6.0–8.3)
Total Bilirubin: 1.2 mg/dL (ref 0.3–1.2)

## 2014-04-28 LAB — BODY FLUID CULTURE: CULTURE: NO GROWTH

## 2014-04-28 LAB — I-STAT TROPONIN, ED: Troponin i, poc: 0 ng/mL (ref 0.00–0.08)

## 2014-04-28 LAB — VITAMIN B12: Vitamin B-12: 607 pg/mL (ref 211–911)

## 2014-04-28 LAB — LACTATE DEHYDROGENASE: LDH: 222 U/L (ref 94–250)

## 2014-04-28 LAB — URINE MICROSCOPIC-ADD ON

## 2014-04-28 LAB — LIPASE, BLOOD: Lipase: 30 U/L (ref 11–59)

## 2014-04-28 LAB — SODIUM, URINE, RANDOM: Sodium, Ur: 43 mEq/L

## 2014-04-28 LAB — CREATININE, URINE, RANDOM: Creatinine, Urine: 71.08 mg/dL

## 2014-04-28 LAB — ABO/RH: ABO/RH(D): A POS

## 2014-04-28 LAB — POTASSIUM: POTASSIUM: 5.6 meq/L — AB (ref 3.7–5.3)

## 2014-04-28 LAB — HEMOGLOBIN AND HEMATOCRIT, BLOOD
HCT: 31.4 % — ABNORMAL LOW (ref 39.0–52.0)
Hemoglobin: 10.5 g/dL — ABNORMAL LOW (ref 13.0–17.0)

## 2014-04-28 LAB — TSH: TSH: 2.37 u[IU]/mL (ref 0.350–4.500)

## 2014-04-28 MED ORDER — ZIDOVUDINE 300 MG PO TABS
300.0000 mg | ORAL_TABLET | Freq: Two times a day (BID) | ORAL | Status: DC
  Administered 2014-04-29: 300 mg via ORAL
  Filled 2014-04-28 (×5): qty 1

## 2014-04-28 MED ORDER — ACETAMINOPHEN 325 MG PO TABS
650.0000 mg | ORAL_TABLET | Freq: Four times a day (QID) | ORAL | Status: DC | PRN
  Filled 2014-04-28: qty 2

## 2014-04-28 MED ORDER — SODIUM CHLORIDE 0.9 % IV SOLN
1.0000 g | Freq: Once | INTRAVENOUS | Status: AC
  Administered 2014-04-28: 1 g via INTRAVENOUS
  Filled 2014-04-28: qty 10

## 2014-04-28 MED ORDER — SODIUM CHLORIDE 0.9 % IV SOLN: INTRAVENOUS | Status: DC

## 2014-04-28 MED ORDER — HYDROMORPHONE HCL 1 MG/ML IJ SOLN
0.5000 mg | Freq: Once | INTRAMUSCULAR | Status: AC
  Administered 2014-04-28: 0.5 mg via INTRAVENOUS
  Filled 2014-04-28: qty 1

## 2014-04-28 MED ORDER — ACETAMINOPHEN 650 MG RE SUPP: 650.0000 mg | Freq: Four times a day (QID) | RECTAL | Status: DC | PRN

## 2014-04-28 MED ORDER — ONDANSETRON HCL 4 MG/2ML IJ SOLN
4.0000 mg | Freq: Once | INTRAMUSCULAR | Status: AC
  Administered 2014-04-28: 4 mg via INTRAVENOUS
  Filled 2014-04-28: qty 2

## 2014-04-28 MED ORDER — ADULT MULTIVITAMIN W/MINERALS CH
1.0000 | ORAL_TABLET | Freq: Every day | ORAL | Status: DC
  Administered 2014-04-30 – 2014-05-08 (×8): 1 via ORAL
  Filled 2014-04-28 (×12): qty 1

## 2014-04-28 MED ORDER — POLYETHYLENE GLYCOL 3350 17 G PO PACK
17.0000 g | PACK | Freq: Every day | ORAL | Status: DC | PRN
  Filled 2014-04-28 (×2): qty 1

## 2014-04-28 MED ORDER — SODIUM CHLORIDE 0.9 % IV SOLN
INTRAVENOUS | Status: DC
  Administered 2014-04-28: 08:00:00 via INTRAVENOUS

## 2014-04-28 MED ORDER — BUPROPION HCL ER (XL) 150 MG PO TB24
150.0000 mg | ORAL_TABLET | Freq: Every day | ORAL | Status: DC
  Administered 2014-04-29 – 2014-05-09 (×10): 150 mg via ORAL
  Filled 2014-04-28 (×12): qty 1

## 2014-04-28 MED ORDER — TRAMADOL HCL 50 MG PO TABS
50.0000 mg | ORAL_TABLET | Freq: Three times a day (TID) | ORAL | Status: DC | PRN
  Administered 2014-04-28: 50 mg via ORAL
  Filled 2014-04-28: qty 1

## 2014-04-28 MED ORDER — SODIUM CHLORIDE 0.9 % IJ SOLN
3.0000 mL | INTRAMUSCULAR | Status: DC | PRN
  Administered 2014-05-01: 3 mL via INTRAVENOUS
  Filled 2014-04-28: qty 3

## 2014-04-28 MED ORDER — SODIUM CHLORIDE 0.9 % IJ SOLN
3.0000 mL | Freq: Two times a day (BID) | INTRAMUSCULAR | Status: DC
  Administered 2014-04-28 – 2014-05-04 (×9): 3 mL via INTRAVENOUS

## 2014-04-28 MED ORDER — ONDANSETRON HCL 4 MG PO TABS: 4.0000 mg | ORAL_TABLET | Freq: Four times a day (QID) | ORAL | Status: DC | PRN

## 2014-04-28 MED ORDER — HEPARIN SODIUM (PORCINE) 5000 UNIT/ML IJ SOLN
5000.0000 [IU] | Freq: Three times a day (TID) | INTRAMUSCULAR | Status: DC
  Administered 2014-04-28 – 2014-04-30 (×6): 5000 [IU] via SUBCUTANEOUS
  Filled 2014-04-28 (×9): qty 1

## 2014-04-28 MED ORDER — ROSUVASTATIN CALCIUM 5 MG PO TABS
5.0000 mg | ORAL_TABLET | Freq: Every day | ORAL | Status: DC
  Administered 2014-04-28 – 2014-05-08 (×10): 5 mg via ORAL
  Filled 2014-04-28 (×12): qty 1

## 2014-04-28 MED ORDER — SODIUM CHLORIDE 0.9 % IV BOLUS (SEPSIS)
250.0000 mL | Freq: Once | INTRAVENOUS | Status: AC
  Administered 2014-04-28: 250 mL via INTRAVENOUS

## 2014-04-28 MED ORDER — ONDANSETRON HCL 4 MG/2ML IJ SOLN
4.0000 mg | Freq: Four times a day (QID) | INTRAMUSCULAR | Status: DC | PRN
  Filled 2014-04-28: qty 2

## 2014-04-28 MED ORDER — LAMIVUDINE 150 MG PO TABS
150.0000 mg | ORAL_TABLET | Freq: Every day | ORAL | Status: DC
Start: 2014-04-28 — End: 2014-04-30
  Administered 2014-04-29 – 2014-04-30 (×2): 150 mg via ORAL
  Filled 2014-04-28 (×3): qty 1

## 2014-04-28 MED ORDER — HYDROCODONE-ACETAMINOPHEN 5-325 MG PO TABS
1.0000 | ORAL_TABLET | Freq: Four times a day (QID) | ORAL | Status: DC | PRN
  Administered 2014-05-04: 1 via ORAL
  Filled 2014-04-28: qty 1

## 2014-04-28 MED ORDER — SODIUM CHLORIDE 0.9 % IV SOLN
INTRAVENOUS | Status: DC
  Administered 2014-04-29 – 2014-04-30 (×4): via INTRAVENOUS

## 2014-04-28 MED ORDER — VITAMIN D3 25 MCG (1000 UNIT) PO TABS
2000.0000 [IU] | ORAL_TABLET | Freq: Every day | ORAL | Status: DC
  Administered 2014-04-29 – 2014-05-08 (×9): 2000 [IU] via ORAL
  Filled 2014-04-28 (×12): qty 2

## 2014-04-28 MED ORDER — SODIUM POLYSTYRENE SULFONATE 15 GM/60ML PO SUSP
45.0000 g | Freq: Once | ORAL | Status: AC
  Administered 2014-04-28: 45 g via ORAL
  Filled 2014-04-28: qty 180

## 2014-04-28 MED ORDER — SODIUM CHLORIDE 0.9 % IV SOLN: 250.0000 mL | INTRAVENOUS | Status: DC | PRN

## 2014-04-28 MED ORDER — VITAMIN D3 50 MCG (2000 UT) PO CAPS: 2000.0000 [IU] | ORAL_CAPSULE | Freq: Every day | ORAL | Status: DC

## 2014-04-28 NOTE — ED Provider Notes (Signed)
CSN: [id number]     Arrival date & time 04/28/14  8416 History   First MD Initiated Contact with Patient 04/28/14 [phone]     Chief Complaint  Patient presents with  . Abdominal Pain     (Consider location/radiation/quality/duration/timing/severity/associated sxs/prior Treatment) Patient is a 68 y.o. male presenting with abdominal pain. The history is provided by the patient.  Abdominal Pain Associated symptoms: chest pain, nausea and shortness of breath   Associated symptoms: no dysuria, no fever and no vomiting    patient with several visits of late. Followed predominantly by infectious disease for his HIV status. Patient is on antivirals. Patient with several month history of some discomfort on the right lower chest right upper quadrant. Recently in the last week that has gotten worse. She did have an ultrasound of here in the last 7 days that showed gallstone and sludge. Patient has follow-up appointment with general surgery. In addition patient had a right-sided pleural effusion which was tapped. CT also done raising concerns for tumor in that area. Biopsy pending. Patient presents today because he's had persistent pain for the past 7 days in the right upper quadrant and is worried that the gallbladder is worse. The pain is stated to be 8 out of 10. Radiates into the right upper chest does not go to the back occasionally will go to the top of the right shoulder. Associated with nausea no vomiting. No fevers.  Past Medical History  Diagnosis Date  . HIV (human immunodeficiency virus infection)    Past Surgical History  Procedure Laterality Date  . Tonsillectomy     No family history on file. History  Substance Use Topics  . Smoking status: Former Smoker -- 1.00 packs/day    Types: Cigarettes    Quit date: 04/24/2014  . Smokeless tobacco: Never Used     Comment: stopped Wellbutrin  . Alcohol Use: 0.5 oz/week    1 Not specified per week     Comment: ocassionally    Review of  Systems  Constitutional: Negative for fever.  HENT: Negative for congestion.   Eyes: Negative for visual disturbance.  Respiratory: Positive for shortness of breath.   Cardiovascular: Positive for chest pain.  Gastrointestinal: Positive for nausea and abdominal pain. Negative for vomiting.  Genitourinary: Negative for dysuria.  Musculoskeletal: Negative for back pain.  Skin: Negative for rash.  Neurological: Negative for headaches.  Hematological: Does not bruise/bleed easily.  Psychiatric/Behavioral: Negative for confusion.      Allergies  Review of patient's allergies indicates no known allergies.  Home Medications   Prior to Admission medications   Medication Sig Start Date End Date Taking? Authorizing Provider  atazanavir-cobicistat (EVOTAZ) 300-150 MG per tablet Take 1 tablet by mouth daily. Swallow whole. Do NOT crush, cut or chew tablet. Take with food. 11/22/13  Yes Michel Bickers, MD  buPROPion (WELLBUTRIN XL) 150 MG 24 hr tablet Take 150 mg by mouth daily.   Yes Historical Provider, MD  Cholecalciferol (VITAMIN D3) 2000 UNITS capsule Take 2,000 Units by mouth daily. 10/16/10 03/09/20 Yes Michel Bickers, MD  HYDROcodone-acetaminophen (NORCO) 5-325 MG per tablet Take 1 tablet by mouth every 6 (six) hours as needed for moderate pain. 04/21/14  Yes Tanda Rockers, MD  lamiVUDine (EPIVIR) 150 MG tablet Take 1 tablet (150 mg total) by mouth daily. 11/22/13 11/22/14 Yes Michel Bickers, MD  Multiple Vitamin (MULTIVITAMIN WITH MINERALS) TABS tablet Take 1 tablet by mouth daily.   Yes Historical Provider, MD  rosuvastatin (CRESTOR) 5 MG  tablet Take 1 tablet (5 mg total) by mouth daily. 09/09/13  Yes Michel Bickers, MD  zidovudine (RETROVIR) 300 MG tablet Take 1 tablet (300 mg total) by mouth 2 (two) times daily. 11/22/13 11/22/14 Yes Michel Bickers, MD  Multiple Vitamins-Minerals (CENTRUM SILVER PO) Take 1 tablet by mouth daily.      Historical Provider, MD   BP 107/70 mmHg  Pulse 94  Temp(Src) 98  F (36.7 C) (Oral)  Resp 23  Ht 5\' 6"  (1.676 m)  Wt 122 lb (55.339 kg)  BMI 19.70 kg/m2  SpO2 96% Physical Exam  Constitutional: He is oriented to person, place, and time. He appears well-developed and well-nourished. No distress.  HENT:  Head: Normocephalic and atraumatic.  Mouth/Throat: Oropharynx is clear and moist.  Eyes: Conjunctivae and EOM are normal. Pupils are equal, round, and reactive to light.  Neck: Normal range of motion. Neck supple.  Cardiovascular: Normal rate, regular rhythm and normal heart sounds.   No murmur heard. Pulmonary/Chest: Effort normal and breath sounds normal. No respiratory distress.  Decreased breath sounds on the right.  Abdominal: Soft. Bowel sounds are normal. There is tenderness.  Mild tenderness right upper quadrant no guarding.  Musculoskeletal: Normal range of motion. He exhibits edema.  Neurological: He is oriented to person, place, and time. No cranial nerve deficit. He exhibits normal muscle tone. Coordination normal.  Skin: Skin is warm. No rash noted.  Nursing note and vitals reviewed.   ED Course  Procedures (including critical care time) Labs Review Labs Reviewed  CBC WITH DIFFERENTIAL - Abnormal; Notable for the following:    RBC 2.66 (*)    Hemoglobin 11.1 (*)    HCT 32.8 (*)    MCV 123.3 (*)    MCH 41.7 (*)    All other components within normal limits  COMPREHENSIVE METABOLIC PANEL - Abnormal; Notable for the following:    Sodium 134 (*)    Potassium 5.7 (*)    Glucose, Bld 126 (*)    BUN 75 (*)    Creatinine, Ser 3.42 (*)    Albumin 2.2 (*)    GFR calc non Af Amer 17 (*)    GFR calc Af Amer 20 (*)    Anion gap 16 (*)    All other components within normal limits  POTASSIUM - Abnormal; Notable for the following:    Potassium 5.6 (*)    All other components within normal limits  LIPASE, BLOOD  URINALYSIS, ROUTINE W REFLEX MICROSCOPIC  I-STAT TROPOININ, ED   Results for orders placed or performed during the  hospital encounter of 04/28/14  CBC with Differential  Result Value Ref Range   WBC 8.9 4.0 - 10.5 K/uL   RBC 2.66 (L) 4.22 - 5.81 MIL/uL   Hemoglobin 11.1 (L) 13.0 - 17.0 g/dL   HCT 32.8 (L) 39.0 - 52.0 %   MCV 123.3 (H) 78.0 - 100.0 fL   MCH 41.7 (H) 26.0 - 34.0 pg   MCHC 33.8 30.0 - 36.0 g/dL   RDW 11.8 11.5 - 15.5 %   Platelets 348 150 - 400 K/uL   Neutrophils Relative % 74 43 - 77 %   Lymphocytes Relative 17 12 - 46 %   Monocytes Relative 8 3 - 12 %   Eosinophils Relative 1 0 - 5 %   Basophils Relative 0 0 - 1 %   Neutro Abs 6.6 1.7 - 7.7 K/uL   Lymphs Abs 1.5 0.7 - 4.0 K/uL   Monocytes Absolute 0.7  0.1 - 1.0 K/uL   Eosinophils Absolute 0.1 0.0 - 0.7 K/uL   Basophils Absolute 0.0 0.0 - 0.1 K/uL   Smear Review PLATELET CLUMPS NOTED ON SMEAR   Comprehensive metabolic panel  Result Value Ref Range   Sodium 134 (L) 137 - 147 mEq/L   Potassium 5.7 (H) 3.7 - 5.3 mEq/L   Chloride 96 96 - 112 mEq/L   CO2 22 19 - 32 mEq/L   Glucose, Bld 126 (H) 70 - 99 mg/dL   BUN 75 (H) 6 - 23 mg/dL   Creatinine, Ser 3.42 (H) 0.50 - 1.35 mg/dL   Calcium 9.0 8.4 - 10.5 mg/dL   Total Protein 6.8 6.0 - 8.3 g/dL   Albumin 2.2 (L) 3.5 - 5.2 g/dL   AST 16 0 - 37 U/L   ALT 16 0 - 53 U/L   Alkaline Phosphatase 84 39 - 117 U/L   Total Bilirubin 1.2 0.3 - 1.2 mg/dL   GFR calc non Af Amer 17 (L) >90 mL/min   GFR calc Af Amer 20 (L) >90 mL/min   Anion gap 16 (H) 5 - 15  Lipase, blood  Result Value Ref Range   Lipase 30 11 - 59 U/L  Potassium  Result Value Ref Range   Potassium 5.6 (H) 3.7 - 5.3 mEq/L  I-stat troponin, ED  Result Value Ref Range   Troponin i, poc 0.00 0.00 - 0.08 ng/mL   Comment 3             Imaging Review Dg Chest 2 View  04/28/2014   CLINICAL DATA:  Shortness of breath and cough.  Chest pain.  EXAM: CHEST  2 VIEW  COMPARISON:  Chest x-ray and chest CT dated 04/25/2014 and chest x-ray dated 04/20/2014  FINDINGS: The multiloculated right pleural effusion has slightly  increased in size. Heart size and pulmonary vascularity are normal. Emphysematous changes are noted throughout both lungs. No infiltrates or effusions on the left. No acute osseous abnormality.  IMPRESSION: Slight increase in the multiloculated right pleural effusion. Emphysema.   Electronically Signed   By: Rozetta Nunnery M.D.   On: 04/28/2014 08:39   US Abdomen Complete  04/28/2014   CLINICAL DATA:  Abdominal pain  EXAM: ULTRASOUND ABDOMEN COMPLETE  COMPARISON:  Ultrasound abdomen 04/20/2014  FINDINGS: Gallbladder: 13 mm gallstone. Mild tenderness over the gallbladder on examination. Gallbladder wall is not thickened.  Common bile duct: Diameter: 5.2 mm  Liver: No focal lesion identified. Within normal limits in parenchymal echogenicity.  IVC: No abnormality visualized.  Pancreas: Visualized portion unremarkable.  Spleen: Size and appearance within normal limits.  Right Kidney: Length: 9.4 cm. Echogenicity within normal limits. No mass or hydronephrosis visualized.  Left Kidney: Length: 9.6 cm. 7.7 mm upper pole echogenic focus may represent a stone. Echogenicity within normal limits. No mass or hydronephrosis visualized.  Abdominal aorta: No aneurysm visualized.  Other findings: Right pleural effusion  IMPRESSION: 13 mm gallstone. Mild tenderness over the gallbladder without gallbladder wall thickening.  Possible left upper pole renal calculus.   Electronically Signed   By: Franchot Gallo M.D.   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 Critical care was necessary to treat or prevent imminent or life-threatening deterioration. Critical care was time spent personally by me on the following activities: development of treatment plan with patient and/or surrogate as well as nursing, discussions with consultants, evaluation of patient's response to treatment, examination of patient, obtaining history from patient or surrogate, ordering and performing treatments and interventions, ordering and review of laboratory studies, ordering and review of radiographic studies, pulse oximetry and re-evaluation of patient's condition.      MDM   Final diagnoses:  Abdominal pain  Chest pain  Renal failure  Hyperkalemia    Patient with history of gallstones was concerned about worsening of the gallbladder. Ultrasound does show gallstones with no evidence consistent with acute cholecystitis. Patient is showing evidence of worsening renal failure and hyperkalemia. Patient given calcium gluconate 1 g here. She will require admission. Patient followed by infectious disease as well as [facility].  We'll discuss administering Kayexalate. But most likely patient may require dialysis. Chest x-ray with some effusion slightly worse than it was before but not significantly increased.  As stated from the gallbladder standpoint no evidence of pancreatitis no evidence of significant liver function test abnormalities no elevation of bilirubin. No significant swelling of the gallbladder.  Kayexalate ordered. Medicine will admit to telemetry.    Fredia Sorrow, MD 04/28/14 1006

## 2014-04-28 NOTE — ED Notes (Signed)
Dr. Wallie Char at bedside pt placed in high-fowlers and 250cc NS bolus given per Dr. Wallie Char

## 2014-04-28 NOTE — ED Notes (Signed)
Attempted report 

## 2014-04-28 NOTE — Plan of Care (Signed)
Problem: Phase I Progression Outcomes Goal: OOB as tolerated unless otherwise ordered Outcome: Completed/Met Date Met:  04/28/14 Goal: Voiding-avoid urinary catheter unless indicated Outcome: Completed/Met Date Met:  04/28/14

## 2014-04-28 NOTE — ED Notes (Signed)
Dr. Zackowski at bedside  

## 2014-04-28 NOTE — H&P (Addendum)
[facility] History and Physical  [name] [name] JAS:[id number] DOB: [birth year] DOA: 04/28/2014  Referring physician: Dr. Venita Sheffield PCP: [name] Stagers, MD   Chief Complaint: RUQ abd pain  HPI: [name] [name] is a 68 y.o. male with past medical history of chronic renal disease with a worsening creatinine over the last here,  HIV with undetectable viral load, on antiviral therapy came into the hospital several days to admission for abdominal pain he was diagnosed with gallbladder stones was told to follow-up with surgery, he also follows with Dr. Shyrl Numbers and a CT scan of the chest was done on 04/25/2014 that showed a mass and effusion had a thoracocentesis on that day and that should fluid showed no malignancies he relates his abdominal pain is made worse by eating its on the right side some tendency radiates to the back. He relates nothing makes it better. No relations with exertion, he denies fever, chills, nausea, vomiting or diarrhea. He does relate some constipation lately. He also relates some weight loss, no night swaets  In the ED: Basic metabolic panel was done that shows a potassium of 5.7 he was treated with glucose, insulin, calcium, Kayexalate, chest x-ray was done that shows a recurrent effusion, abdominal ultrasound was done that showed gallbladder stone, with some tenderness on the ultrasound but no signs of acute cholecystitis.  Review of Systems:  Constitutional:  No weight loss, night sweats, Fevers, chills, fatigue.  HEENT:  No headaches, Difficulty swallowing,Tooth/dental problems,Sore throat,  No sneezing, itching, ear ache, nasal congestion, post nasal drip,  Cardio-vascular:  No chest pain, Orthopnea, PND, swelling in lower extremities, anasarca, dizziness, palpitations  GI:  No heartburn, indigestion, abdominal pain, nausea, vomiting, diarrhea, change in bowel habits, loss of appetite  Resp:  No shortness of breath with exertion or at rest. No excess mucus,  no productive cough, No non-productive cough, No coughing up of blood.No change in color of mucus.No wheezing.No chest wall deformity  Skin:  no rash or lesions.  GU:  no dysuria, change in color of urine, no urgency or frequency. No flank pain.  Musculoskeletal:  No joint pain or swelling. No decreased range of motion. No back pain.  Psych:  No change in mood or affect. No depression or anxiety. No memory loss.   Past Medical History  Diagnosis Date  . HIV (human immunodeficiency virus infection)    Past Surgical History  Procedure Laterality Date  . Tonsillectomy     Social History:  reports that he quit smoking 4 days ago. His smoking use included Cigarettes. He smoked 1.00 pack per day. He has never used smokeless tobacco. He reports that he drinks about 0.5 oz of alcohol per week. He reports that he does not use illicit drugs.  No Known Allergies  Family History  Problem Relation Age of Onset  . Heart failure Mother   . Pneumonia Father      Prior to Admission medications   Medication Sig Start Date End Date Taking? Authorizing Provider  atazanavir-cobicistat (EVOTAZ) 300-150 MG per tablet Take 1 tablet by mouth daily. Swallow whole. Do NOT crush, cut or chew tablet. Take with food. 11/22/13  Yes Michel Bickers, MD  buPROPion (WELLBUTRIN XL) 150 MG 24 hr tablet Take 150 mg by mouth daily.   Yes Historical Provider, MD  Cholecalciferol (VITAMIN D3) 2000 UNITS capsule Take 2,000 Units by mouth daily. 10/16/10 03/09/20 Yes Michel Bickers, MD  HYDROcodone-acetaminophen (NORCO) 5-325 MG per tablet Take 1 tablet by mouth every 6 (six) hours  as needed for moderate pain. 04/21/14  Yes Tanda Rockers, MD  lamiVUDine (EPIVIR) 150 MG tablet Take 1 tablet (150 mg total) by mouth daily. 11/22/13 11/22/14 Yes Michel Bickers, MD  Multiple Vitamin (MULTIVITAMIN WITH MINERALS) TABS tablet Take 1 tablet by mouth daily.   Yes Historical Provider, MD  rosuvastatin (CRESTOR) 5 MG tablet Take 1 tablet (5 mg  total) by mouth daily. 09/09/13  Yes Michel Bickers, MD  zidovudine (RETROVIR) 300 MG tablet Take 1 tablet (300 mg total) by mouth 2 (two) times daily. 11/22/13 11/22/14 Yes Michel Bickers, MD  Multiple Vitamins-Minerals (CENTRUM SILVER PO) Take 1 tablet by mouth daily.      Historical Provider, MD   Physical Exam: Filed Vitals:   04/28/14 1126 04/28/14 1144 04/28/14 1230 04/28/14 1300  BP: 97/62 109/64 105/65 98/68  Pulse: 96 89 110 106  Temp:      TempSrc:      Resp: 14 17 25 19   Height:      Weight:      SpO2: 99% 98% 95% 96%    Wt Readings from Last 3 Encounters:  04/28/14 55.339 kg (122 lb)  04/21/14 55.52 kg (122 lb 6.4 oz)  03/09/14 55.792 kg (123 lb)    General:  Appears calm and comfortable Eyes: PERRL, normal lids, irises & conjunctiva ENT: grossly normal hearing, lips & tongue Neck: no LAD, masses or thyromegaly Cardiovascular: RRR, no m/r/g. No LE edema. Telemetry: SR, no arrhythmias  Respiratory: CTA bilaterally, no w/r/r. Normal respiratory effort. Abdomen: soft,right upper quadrant tenderness positive Murphy signs no guarding or rebound.  Skin: no rash or induration seen on limited exam Musculoskeletal: grossly normal tone BUE/BLE Psychiatric: grossly normal mood and affect, speech fluent and appropriate Neurologic: grossly non-focal.          Labs on Admission:  Basic Metabolic Panel:  Recent Labs Lab 04/28/14 0730 04/28/14 0830  NA 134*  --   K 5.7* 5.6*  CL 96  --   CO2 22  --   GLUCOSE 126*  --   BUN 75*  --   CREATININE 3.42*  --   CALCIUM 9.0  --    Liver Function Tests:  Recent Labs Lab 04/28/14 0730  AST 16  ALT 16  ALKPHOS 84  BILITOT 1.2  PROT 6.8  ALBUMIN 2.2*    Recent Labs Lab 04/28/14 0730  LIPASE 30   No results for input(s): AMMONIA in the last 168 hours. CBC:  Recent Labs Lab 04/28/14 0730 04/28/14 1251  WBC 8.9  --   NEUTROABS 6.6  --   HGB 11.1* 10.5*  HCT 32.8* 31.4*  MCV 123.3*  --   PLT 348  --     Cardiac Enzymes: No results for input(s): CKTOTAL, CKMB, CKMBINDEX, TROPONINI in the last 168 hours.  BNP (last 3 results)  Recent Labs  04/21/14 1131  PROBNP 25.0   CBG: No results for input(s): GLUCAP in the last 168 hours.  Radiological Exams on Admission: Dg Chest 2 View  04/28/2014   CLINICAL DATA:  Shortness of breath and cough.  Chest pain.  EXAM: CHEST  2 VIEW  COMPARISON:  Chest x-ray and chest CT dated 04/25/2014 and chest x-ray dated 04/20/2014  FINDINGS: The multiloculated right pleural effusion has slightly increased in size. Heart size and pulmonary vascularity are normal. Emphysematous changes are noted throughout both lungs. No infiltrates or effusions on the left. No acute osseous abnormality.  IMPRESSION: Slight increase in the multiloculated right pleural  effusion. Emphysema.   Electronically Signed   By: Rozetta Nunnery M.D.   On: 04/28/2014 08:39   US Abdomen Complete  04/28/2014   CLINICAL DATA:  Abdominal pain  EXAM: ULTRASOUND ABDOMEN COMPLETE  COMPARISON:  Ultrasound abdomen 04/20/2014  FINDINGS: Gallbladder: 13 mm gallstone. Mild tenderness over the gallbladder on examination. Gallbladder wall is not thickened.  Common bile duct: Diameter: 5.2 mm  Liver: No focal lesion identified. Within normal limits in parenchymal echogenicity.  IVC: No abnormality visualized.  Pancreas: Visualized portion unremarkable.  Spleen: Size and appearance within normal limits.  Right Kidney: Length: 9.4 cm. Echogenicity within normal limits. No mass or hydronephrosis visualized.  Left Kidney: Length: 9.6 cm. 7.7 mm upper pole echogenic focus may represent a stone. Echogenicity within normal limits. No mass or hydronephrosis visualized.  Abdominal aorta: No aneurysm visualized.  Other findings: Right pleural effusion  IMPRESSION: 13 mm gallstone. Mild tenderness over the gallbladder without gallbladder wall thickening.  Possible left upper pole renal calculus.   Electronically Signed   By:  Franchot Gallo M.D.   On: 04/28/2014 08:20    EKG: Independently reviewed. Sinus rhythm and complete right bundle branch block and possible left atrial enlargement.  Assessment/Plan Acute kidney injury - His renal function has progressed over the last year, one of his HIV medication can cause acute renal failure but this wouldn't explain the slow progression of his condition. Evotaz can cause renal failure will hold.  - He has not receive any IV contrast. He denies any ibuprofen, will check urinary sodium and urinary creatinine, I will go ahead and check an SPEP and UPEP, his UA does not show any crystals. His renal ultrasound does not show any obstruction or enlarged kidneys. He doesn't seem to have a urinary tract infection.  - Due to his lung mass and his renal infection I am concerned about the possibility of malignancy induced acute renal failure likely lymphoma or leukemia, will check an ANA.  Human immunodeficiency virus (HIV) disease - We'll have pharmacy to dose HIV medications. - We'll have him try to see which of his HIV medication can cause acute renal failure.  Hyperkalemia - He was rated given Kayexalate in the ED check a basic metabolic panel tonight.  Recurrent pleural effusion on right - I'll ask IR to try to perform thoracocentesis and send for cytology again, LDH, protein. - Question is do to lymphoma.   Macrocytic anemia - Check a B-12 and RBC folate.  Gallbladder stone without cholecystitis or obstruction - Unlikely that this is causing right upper quadrant pain. His LFTs are within normal limits. Get HIDA  scan.    Code Status: full DVT Prophylaxis:heparin Family Communication: none Disposition Plan: inpatient  Time spent: [place], [facility] Pager [phone]

## 2014-04-28 NOTE — Progress Notes (Signed)
Admission note:   Arrival Method: Via stretcher from ED.  Mental Status: A&OX4. Telemetry: Placed on box #21.  Skin: Intact.  Tubes: N/A. IV: RAC 04/28/14 Pain: Denies.  Family: Niece at bedside. Living Situation: Home alone. Safety Measures: Call bell within reach.  6E Orientation: Oriented to unit and surroundings.  Joellen Jersey, RN.

## 2014-04-28 NOTE — ED Notes (Signed)
Patient sitting on bedside commode attempting to have BM. BP 78/57. Pt assisted to bed and placed in trendelenburg pt BP increased to 90/62, HR 115, SpO2 89%- pt. Placed on 2L of O2 98%/2L

## 2014-04-28 NOTE — Consult Note (Addendum)
Reason for Consult: Acute renal failure on chronic kidney disease stage III Referring Physician: Charlynne Cousins MD [facility])  HPI:  68 year old Caucasian man with a history of baseline chronic kidney disease stage III (creatinine ranging 1.9-2.2), HIV infection on antiretroviral therapy with an undetectable viral load and a history of depression. Recently seen by Dr. [name] of pulmonology for a right pleural effusion with a right lower lobe mass suggesting neoplasm associated with nodular and irregular pleural thickening in the right hemithorax. Underwent thoracentesis on 04/25/14 by interventional radiology yielding 800 mL of pleural fluid-elevated LDH and no obvious malignant cells. Concern is raised with regards to his rising creatinine which was 1.6 back in 2014 rising to 1.9 earlier this year and 2.2 three months ago. Last week when thoracentesis done, creatinine elevated at 3.3 and earlier today when he was admitted to the hospital, found to be hyperkalemic (5.7) and in acute renal failure-creatinine 3.4. From review of notes, it appears that he was converted to an Evotaz based regime in April of this year (containing Cobicistat that impairs tubular secretion of creatinine). He remains on Lamivudine and Zidovudine. He had an abdominal ultrasound with satisfactory visualization of the kidneys revealing no hydronephrosis-right kidney 9.4 cm and left 9.6 cm with possible nonobstructive upper pole calculus.  Presented to the hospital earlier today with severe right upper quadrant abdominal pain. This was preceded about 2 weeks ago by episodes of nausea and vomiting and poor oral intake that has persisted since then. He also had some constipation earlier after taking narcotics for pain-he took some magnesium citrate for this but denies the use of any enemas/phosphorous products. He admits to have been taking 2 tablets of Aleve twice a day for the past week-used it intermittently in the past. Denies any  antibiotic use. Denies any prior history of acute renal failure, history of kidney stones or recurrent urinary tract infection. Denies any current hematuria or suspicious flank pain. Denies any skin rash. Reports bright red blood per rectum or the past 2 days.    07/17/2012  12/04/2012  05/25/2013  01/18/2014  04/21/2014  04/28/2014   BUN 21 16 23 28  (H) 36 (H) 75 (H)  Creatinine 1.66 (H) 1.65 (H) 1.89 (H) 2.20 (H) 3.3 (H) 3.42 (H)    Past Medical History  Diagnosis Date  . HIV (human immunodeficiency virus infection) dx'd 2010  . High cholesterol   . Bleeding per rectum   . Depression   . Chronic renal disease   . Peripheral neuropathy   . Pleural effusion on right     Past Surgical History  Procedure Laterality Date  . Tonsillectomy    . Thoracentesis Right 04/25/2014    Family History  Problem Relation Age of Onset  . Heart failure Mother   . Pneumonia Father     Social History:  reports that he quit smoking 4 days ago. His smoking use included Cigarettes. He has a 46 pack-year smoking history. He has never used smokeless tobacco. He reports that he drinks about 0.5 oz of alcohol per week. He reports that he uses illicit drugs.  Allergies: No Known Allergies  Medications:  Scheduled: . buPROPion  150 mg Oral Daily  . cholecalciferol  2,000 Units Oral Daily  . heparin  5,000 Units Subcutaneous 3 times per day  . lamiVUDine  150 mg Oral Daily  . multivitamin with minerals  1 tablet Oral Daily  . rosuvastatin  5 mg Oral Daily  . sodium chloride  3 mL Intravenous  Q12H  . zidovudine  300 mg Oral BID    Results for orders placed or performed during the hospital encounter of 04/28/14 (from the past 48 hour(s))  CBC with Differential     Status: Abnormal   Collection Time: 04/28/14  7:30 AM  Result Value Ref Range   WBC 8.9 4.0 - 10.5 K/uL   RBC 2.66 (L) 4.22 - 5.81 MIL/uL   Hemoglobin 11.1 (L) 13.0 - 17.0 g/dL    Comment: REPEATED TO VERIFY   HCT 32.8 (L) 39.0 - 52.0 %    MCV 123.3 (H) 78.0 - 100.0 fL    Comment: REPEATED TO VERIFY   MCH 41.7 (H) 26.0 - 34.0 pg   MCHC 33.8 30.0 - 36.0 g/dL   RDW 11.8 11.5 - 15.5 %   Platelets 348 150 - 400 K/uL   Neutrophils Relative % 74 43 - 77 %   Lymphocytes Relative 17 12 - 46 %   Monocytes Relative 8 3 - 12 %   Eosinophils Relative 1 0 - 5 %   Basophils Relative 0 0 - 1 %   Neutro Abs 6.6 1.7 - 7.7 K/uL   Lymphs Abs 1.5 0.7 - 4.0 K/uL   Monocytes Absolute 0.7 0.1 - 1.0 K/uL   Eosinophils Absolute 0.1 0.0 - 0.7 K/uL   Basophils Absolute 0.0 0.0 - 0.1 K/uL   Smear Review PLATELET CLUMPS NOTED ON SMEAR   Comprehensive metabolic panel     Status: Abnormal   Collection Time: 04/28/14  7:30 AM  Result Value Ref Range   Sodium 134 (L) 137 - 147 mEq/L   Potassium 5.7 (H) 3.7 - 5.3 mEq/L   Chloride 96 96 - 112 mEq/L   CO2 22 19 - 32 mEq/L   Glucose, Bld 126 (H) 70 - 99 mg/dL   BUN 75 (H) 6 - 23 mg/dL   Creatinine, Ser 3.42 (H) 0.50 - 1.35 mg/dL   Calcium 9.0 8.4 - 10.5 mg/dL   Total Protein 6.8 6.0 - 8.3 g/dL   Albumin 2.2 (L) 3.5 - 5.2 g/dL   AST 16 0 - 37 U/L   ALT 16 0 - 53 U/L   Alkaline Phosphatase 84 39 - 117 U/L   Total Bilirubin 1.2 0.3 - 1.2 mg/dL   GFR calc non Af Amer 17 (L) >90 mL/min   GFR calc Af Amer 20 (L) >90 mL/min    Comment: (NOTE) The eGFR has been calculated using the CKD EPI equation. This calculation has not been validated in all clinical situations. eGFR's persistently <90 mL/min signify possible Chronic Kidney Disease.    Anion gap 16 (H) 5 - 15  Lipase, blood     Status: None   Collection Time: 04/28/14  7:30 AM  Result Value Ref Range   Lipase 30 11 - 59 U/L  I-stat troponin, ED     Status: None   Collection Time: 04/28/14  7:43 AM  Result Value Ref Range   Troponin i, poc 0.00 0.00 - 0.08 ng/mL   Comment 3            Comment: Due to the release kinetics of cTnI, a negative result within the first hours of the onset of symptoms does not rule out myocardial infarction with  certainty. If myocardial infarction is still suspected, repeat the test at appropriate intervals.   Potassium     Status: Abnormal   Collection Time: 04/28/14  8:30 AM  Result Value Ref Range   Potassium  5.6 (H) 3.7 - 5.3 mEq/L  Urinalysis, Routine w reflex microscopic     Status: Abnormal   Collection Time: 04/28/14  8:42 AM  Result Value Ref Range   Color, Urine YELLOW YELLOW   APPearance CLOUDY (A) CLEAR   Specific Gravity, Urine 1.014 1.005 - 1.030   pH 6.0 5.0 - 8.0   Glucose, UA NEGATIVE NEGATIVE mg/dL   Hgb urine dipstick SMALL (A) NEGATIVE   Bilirubin Urine NEGATIVE NEGATIVE   Ketones, ur NEGATIVE NEGATIVE mg/dL   Protein, ur 30 (A) NEGATIVE mg/dL   Urobilinogen, UA 0.2 0.0 - 1.0 mg/dL   Nitrite NEGATIVE NEGATIVE   Leukocytes, UA LARGE (A) NEGATIVE  Urine microscopic-add on     Status: Abnormal   Collection Time: 04/28/14  8:42 AM  Result Value Ref Range   Squamous Epithelial / LPF RARE RARE   WBC, UA 21-50 <3 WBC/hpf   RBC / HPF 7-10 <3 RBC/hpf   Bacteria, UA RARE RARE   Casts HYALINE CASTS (A) NEGATIVE  Hemoglobin and hematocrit, blood     Status: Abnormal   Collection Time: 04/28/14 12:51 PM  Result Value Ref Range   Hemoglobin 10.5 (L) 13.0 - 17.0 g/dL   HCT 31.4 (L) 39.0 - 52.0 %  Type and screen     Status: None   Collection Time: 04/28/14 12:55 PM  Result Value Ref Range   ABO/RH(D) A POS    Antibody Screen NEG    Sample Expiration 05/01/2014   ABO/Rh     Status: None   Collection Time: 04/28/14 12:55 PM  Result Value Ref Range   ABO/RH(D) A POS     Dg Chest 2 View  04/28/2014   CLINICAL DATA:  Shortness of breath and cough.  Chest pain.  EXAM: CHEST  2 VIEW  COMPARISON:  Chest x-ray and chest CT dated 04/25/2014 and chest x-ray dated 04/20/2014  FINDINGS: The multiloculated right pleural effusion has slightly increased in size. Heart size and pulmonary vascularity are normal. Emphysematous changes are noted throughout both lungs. No infiltrates or  effusions on the left. No acute osseous abnormality.  IMPRESSION: Slight increase in the multiloculated right pleural effusion. Emphysema.   Electronically Signed   By: Rozetta Nunnery M.D.   On: 04/28/2014 08:39   US Abdomen Complete  04/28/2014   CLINICAL DATA:  Abdominal pain  EXAM: ULTRASOUND ABDOMEN COMPLETE  COMPARISON:  Ultrasound abdomen 04/20/2014  FINDINGS: Gallbladder: 13 mm gallstone. Mild tenderness over the gallbladder on examination. Gallbladder wall is not thickened.  Common bile duct: Diameter: 5.2 mm  Liver: No focal lesion identified. Within normal limits in parenchymal echogenicity.  IVC: No abnormality visualized.  Pancreas: Visualized portion unremarkable.  Spleen: Size and appearance within normal limits.  Right Kidney: Length: 9.4 cm. Echogenicity within normal limits. No mass or hydronephrosis visualized.  Left Kidney: Length: 9.6 cm. 7.7 mm upper pole echogenic focus may represent a stone. Echogenicity within normal limits. No mass or hydronephrosis visualized.  Abdominal aorta: No aneurysm visualized.  Other findings: Right pleural effusion  IMPRESSION: 13 mm gallstone. Mild tenderness over the gallbladder without gallbladder wall thickening.  Possible left upper pole renal calculus.   Electronically Signed   By: Franchot Gallo M.D.   On: 04/28/2014 08:20    Review of Systems  Constitutional: Positive for malaise/fatigue. Negative for fever and chills.  HENT: Negative.        Complains of dry mouth  Eyes: Negative.   Respiratory: Positive for cough and shortness  of breath. Negative for hemoptysis, sputum production and wheezing.        Near daily a.m. cough with scant clear sputum production  Cardiovascular: Negative.   Gastrointestinal: Positive for nausea, vomiting, abdominal pain, diarrhea, constipation and blood in stool. Negative for heartburn.       Diarrhea alternating with constipation  Genitourinary: Negative.   Musculoskeletal: Negative.   Skin: Negative.    Neurological: Positive for weakness. Negative for dizziness, tingling, tremors and speech change.  Psychiatric/Behavioral: Negative for depression. The patient is nervous/anxious.    Blood pressure 98/68, pulse 106, temperature 98 F (36.7 C), temperature source Oral, resp. rate 19, height 5' 6"  (1.676 m), weight 55.339 kg (122 lb), SpO2 96 %. Physical Exam  Nursing note and vitals reviewed. Constitutional: He is oriented to person, place, and time. He appears well-developed and well-nourished. No distress.  HENT:  Head: Normocephalic and atraumatic.  Mouth/Throat: No oropharyngeal exudate.  Dry oral mucosa/oropharynx and chapped lips  Eyes: EOM are normal. No scleral icterus.  Neck: Normal range of motion. Neck supple. No JVD present. No thyromegaly present.  Flat neck veins, poor skin turgor  Cardiovascular: Regular rhythm.  Exam reveals no friction rub.   Murmur heard. Regular tachycardia, ejection systolic murmur over apex  Respiratory: Effort normal. He has wheezes.  Intermittent right sided wheezes with decreased breath sounds at the base  GI: Soft. Bowel sounds are normal. There is tenderness. There is no rebound and no guarding.  Mild right upper quadrant tenderness  Musculoskeletal: Normal range of motion. He exhibits no edema or tenderness.  Lymphadenopathy:    He has no cervical adenopathy.  Neurological: He is alert and oriented to person, place, and time. No cranial nerve deficit. Coordination normal.  Skin: Skin is warm and dry. No rash noted. No erythema.  Psychiatric: He has a normal mood and affect. His behavior is normal.    Assessment/Plan: 1. Acute renal failure on chronic kidney disease stage III: Based on the history, timeline of events and available database, this appears to be possibly hemodynamically mediated acute renal failure from volume depletion with ongoing nonsteroidal anti-inflammatory drug use. This is superimposed on a picture that is confounded by  ongoing use of Cobicistat that impairs creatinine secretion. I agree with intravenous fluid resuscitation at this time. Renal ultrasound without any evidence of hydronephrosis/obstruction. Urine significant for protein and blood and hence warrants screening for acute GN with antinuclear antibody, ANCA and complement levels. Screening for plasma cell dyscrasia has been ordered by the hospitalist service. I will quantify urine protein to creatinine ratio. Currently, he is nonoliguric and without any acute HD needs. Will continue to follow closely, strictly monitor input/output and avoid nephrotoxins exposure. 2. Hyperkalemia: Secondary to acute renal failure, status post Kayexalate therapy and currently getting intravenous fluids. Recheck labs this evening. 3. Proteinuria: Unlikely to have HIV-associated nephropathy/collapsing FSGS with an undetectable viral load, will quantify urine protein/creatinine ratio. May indeed have membranous nephropathy associated with what appears to be possible lung malignancy. 4. Pleural effusion/right lower lobe lung mass: Ongoing further evaluation and management by pulmonology 5. Abdominal pain: Noted to have a 13 mm gallstone that is nonobstructive-unclear if this is indeed the etiology of his abdominal pain. May possibly be referred from his right lung base.   Zoriana Oats K. 04/28/2014, 3:12 PM

## 2014-04-28 NOTE — ED Notes (Signed)
Patient presents ambulatory from EMS.  Patient called EMS for right upper abd pain for 1 week.  Patient was to be seen at the surgery center for consultation (12/18) for GB removal.  BP 112/82

## 2014-04-29 ENCOUNTER — Inpatient Hospital Stay (HOSPITAL_COMMUNITY): Payer: Commercial Managed Care - HMO

## 2014-04-29 DIAGNOSIS — I369 Nonrheumatic tricuspid valve disorder, unspecified: Secondary | ICD-10-CM

## 2014-04-29 DIAGNOSIS — J940 Chylous effusion: Secondary | ICD-10-CM

## 2014-04-29 LAB — PULMONARY FUNCTION TEST
FEF 25-75 Post: 0.4 L/sec
FEF 25-75 Pre: 0.39 L/sec
FEF2575-%Change-Post: 4 %
FEF2575-%Pred-Post: 17 %
FEF2575-%Pred-Pre: 16 %
FEV1-%Change-Post: -1 %
FEV1-%Pred-Post: 33 %
FEV1-%Pred-Pre: 33 %
FEV1-Post: 0.96 L
FEV1-Pre: 0.98 L
FEV1FVC-%Change-Post: -7 %
FEV1FVC-%Pred-Pre: 76 %
FEV6-%Change-Post: 5 %
FEV6-%Pred-Post: 47 %
FEV6-%Pred-Pre: 45 %
FEV6-Post: 1.76 L
FEV6-Pre: 1.66 L
FEV6FVC-%Change-Post: 1 %
FEV6FVC-%Pred-Post: 104 %
FEV6FVC-%Pred-Pre: 102 %
FVC-%Change-Post: 5 %
FVC-%Pred-Post: 46 %
FVC-%Pred-Pre: 44 %
FVC-Post: 1.82 L
FVC-Pre: 1.72 L
Post FEV1/FVC ratio: 53 %
Post FEV6/FVC ratio: 98 %
Pre FEV1/FVC ratio: 57 %
Pre FEV6/FVC Ratio: 96 %

## 2014-04-29 LAB — CBC
HCT: 24.5 % — ABNORMAL LOW (ref 39.0–52.0)
Hemoglobin: 8.3 g/dL — ABNORMAL LOW (ref 13.0–17.0)
MCH: 43.7 pg — ABNORMAL HIGH (ref 26.0–34.0)
MCHC: 33.9 g/dL (ref 30.0–36.0)
MCV: 128.9 fL — ABNORMAL HIGH (ref 78.0–100.0)
Platelets: 313 10*3/uL (ref 150–400)
RBC: 1.9 MIL/uL — ABNORMAL LOW (ref 4.22–5.81)
RDW: 12.3 % (ref 11.5–15.5)
WBC: 5.9 10*3/uL (ref 4.0–10.5)

## 2014-04-29 LAB — COMPREHENSIVE METABOLIC PANEL
ALBUMIN: 1.7 g/dL — AB (ref 3.5–5.2)
ALK PHOS: 66 U/L (ref 39–117)
ALT: 13 U/L (ref 0–53)
ANION GAP: 13 (ref 5–15)
AST: 13 U/L (ref 0–37)
BUN: 67 mg/dL — AB (ref 6–23)
CHLORIDE: 106 meq/L (ref 96–112)
CO2: 23 meq/L (ref 19–32)
Calcium: 8.2 mg/dL — ABNORMAL LOW (ref 8.4–10.5)
Creatinine, Ser: 2.89 mg/dL — ABNORMAL HIGH (ref 0.50–1.35)
GFR calc Af Amer: 25 mL/min — ABNORMAL LOW (ref >90)
GFR, EST NON AFRICAN AMERICAN: 21 mL/min — AB (ref >90)
Glucose, Bld: 102 mg/dL — ABNORMAL HIGH (ref 70–99)
POTASSIUM: 4.6 meq/L (ref 3.7–5.3)
Sodium: 142 mEq/L (ref 137–147)
Total Bilirubin: 0.7 mg/dL (ref 0.3–1.2)
Total Protein: 5.3 g/dL — ABNORMAL LOW (ref 6.0–8.3)

## 2014-04-29 LAB — RETICULOCYTES
RBC.: 3.31 MIL/uL — ABNORMAL LOW (ref 4.22–5.81)
RETIC COUNT ABSOLUTE: 36.4 10*3/uL (ref 19.0–186.0)
Retic Ct Pct: 1.1 % (ref 0.4–3.1)

## 2014-04-29 LAB — IRON AND TIBC
IRON: 39 ug/dL — AB (ref 42–135)
SATURATION RATIOS: 23 % (ref 20–55)
TIBC: 166 ug/dL — ABNORMAL LOW (ref 215–435)
UIBC: 127 ug/dL (ref 125–400)

## 2014-04-29 LAB — FOLATE: Folate: 10.8 ng/mL

## 2014-04-29 LAB — PROTEIN / CREATININE RATIO, URINE
Creatinine, Urine: 69.87 mg/dL
PROTEIN CREATININE RATIO: 0.47 — AB (ref 0.00–0.15)
Total Protein, Urine: 32.7 mg/dL

## 2014-04-29 LAB — VITAMIN B12: Vitamin B-12: 423 pg/mL (ref 211–911)

## 2014-04-29 LAB — HEMOGLOBIN AND HEMATOCRIT, BLOOD
HCT: 28.8 % — ABNORMAL LOW (ref 39.0–52.0)
HEMOGLOBIN: 9.1 g/dL — AB (ref 13.0–17.0)

## 2014-04-29 LAB — FOLATE RBC: RBC Folate: 943 ng/mL — ABNORMAL HIGH (ref >280)

## 2014-04-29 LAB — ANA: Anti Nuclear Antibody(ANA): NEGATIVE

## 2014-04-29 LAB — FERRITIN: FERRITIN: 429 ng/mL — AB (ref 22–322)

## 2014-04-29 MED ORDER — ALBUTEROL SULFATE (2.5 MG/3ML) 0.083% IN NEBU
2.5000 mg | INHALATION_SOLUTION | Freq: Once | RESPIRATORY_TRACT | Status: AC
  Administered 2014-04-29: 2.5 mg via RESPIRATORY_TRACT

## 2014-04-29 MED ORDER — LIDOCAINE HCL (PF) 1 % IJ SOLN
INTRAMUSCULAR | Status: AC
  Filled 2014-04-29: qty 10

## 2014-04-29 MED ORDER — ATAZANAVIR-COBICISTAT 300-150 MG PO TABS
1.0000 | ORAL_TABLET | Freq: Every day | ORAL | Status: DC
  Administered 2014-04-29 – 2014-05-02 (×4): 1 via ORAL
  Filled 2014-04-29 (×5): qty 1

## 2014-04-29 MED ORDER — CEFTRIAXONE SODIUM IN DEXTROSE 20 MG/ML IV SOLN
1.0000 g | INTRAVENOUS | Status: DC
  Administered 2014-04-29 – 2014-05-03 (×5): 1 g via INTRAVENOUS
  Filled 2014-04-29 (×5): qty 50

## 2014-04-29 MED ORDER — NEPRO/CARBSTEADY PO LIQD
237.0000 mL | Freq: Three times a day (TID) | ORAL | Status: DC
  Administered 2014-04-29 – 2014-05-03 (×7): 237 mL via ORAL
  Filled 2014-04-29 (×6): qty 237

## 2014-04-29 MED ORDER — WHITE PETROLATUM GEL
Status: AC
  Administered 2014-04-29: 10:00:00
  Filled 2014-04-29: qty 5

## 2014-04-29 MED ORDER — PANTOPRAZOLE SODIUM 40 MG IV SOLR
40.0000 mg | Freq: Two times a day (BID) | INTRAVENOUS | Status: DC
  Administered 2014-04-29 – 2014-05-01 (×5): 40 mg via INTRAVENOUS
  Filled 2014-04-29 (×6): qty 40

## 2014-04-29 MED ORDER — TECHNETIUM TC 99M MEBROFENIN IV KIT
5.0000 | PACK | Freq: Once | INTRAVENOUS | Status: AC | PRN
  Administered 2014-04-29: 5 via INTRAVENOUS

## 2014-04-29 NOTE — Consult Note (Signed)
[facility] CONSULT Reason for consult: Bright Red Blood per Rectum Referring Physician: [facility]. PCP: Dr. Sara Chu  [name] [name] is an 68 y.o. male.  HPI: he has a history of HIV disease that has been in remission and suppression with medications. He also has chronic renal disease. He was seen recently with right-sided abdominal pain ultrasound showing showing single gallstone but he was found to have a right pleural effusion and a right lower lobe mass. He is said to thoracentesis. The fluid apparently was bloody but there was no malignant cells no clear infection. The patient apparently is due to have this mass biopsied in the future. He had been given some Vicodin for right-sided chest pain/right upper quadrant pain. This caused them to be severely constipated and he had bright red blood per rectum prior to admission to the hospital and apparently in the emergency room. The patient notes that his bowels are usually loose and he is almost never constipated unless he takes narcotics for pain. He had a colonoscopy it [facility] end[facility] Center 4/13 for screening purposes and this was completely normal. He is not had any recent change in his bowel habits until he began to take the Vicodin for his right-sided chest pain and right upper quadrant pain. He has had no further bleeding here in the hospital. He denies any abdominal pain at this time.  Past Medical History  Diagnosis Date  . HIV (human immunodeficiency virus infection) dx'd 2010  . High cholesterol   . Bleeding per rectum   . Depression   . Chronic renal disease   . Peripheral neuropathy   . Pleural effusion on right     Past Surgical History  Procedure Laterality Date  . Tonsillectomy    . Thoracentesis Right 04/25/2014    Family History  Problem Relation Age of Onset  . Heart failure Mother   . Pneumonia Father     Social History:  reports that he quit smoking 5 days ago. His smoking use included  Cigarettes. He has a 46 pack-year smoking history. He has never used smokeless tobacco. He reports that he drinks about 0.5 oz of alcohol per week. He reports that he uses illicit drugs.  Allergies: No Known Allergies  Medications; Prior to Admission medications   Medication Sig Start Date End Date Taking? Authorizing Provider  atazanavir-cobicistat (EVOTAZ) 300-150 MG per tablet Take 1 tablet by mouth daily. Swallow whole. Do NOT crush, cut or chew tablet. Take with food. 11/22/13  Yes Michel Bickers, MD  buPROPion (WELLBUTRIN XL) 150 MG 24 hr tablet Take 150 mg by mouth daily.   Yes Historical Provider, MD  Cholecalciferol (VITAMIN D3) 2000 UNITS capsule Take 2,000 Units by mouth daily. 10/16/10 03/09/20 Yes Michel Bickers, MD  HYDROcodone-acetaminophen (NORCO) 5-325 MG per tablet Take 1 tablet by mouth every 6 (six) hours as needed for moderate pain. 04/21/14  Yes Tanda Rockers, MD  lamiVUDine (EPIVIR) 150 MG tablet Take 1 tablet (150 mg total) by mouth daily. 11/22/13 11/22/14 Yes Michel Bickers, MD  Multiple Vitamin (MULTIVITAMIN WITH MINERALS) TABS tablet Take 1 tablet by mouth daily.   Yes Historical Provider, MD  rosuvastatin (CRESTOR) 5 MG tablet Take 1 tablet (5 mg total) by mouth daily. 09/09/13  Yes Michel Bickers, MD  zidovudine (RETROVIR) 300 MG tablet Take 1 tablet (300 mg total) by mouth 2 (two) times daily. 11/22/13 11/22/14 Yes Michel Bickers, MD  Multiple Vitamins-Minerals (CENTRUM SILVER PO) Take 1 tablet by mouth daily.  Historical Provider, MD   . atazanavir-cobicistat  1 tablet Oral Q breakfast  . buPROPion  150 mg Oral Daily  . cefTRIAXone (ROCEPHIN)  IV  1 g Intravenous Q24H  . cholecalciferol  2,000 Units Oral Daily  . feeding supplement (NEPRO CARB STEADY)  237 mL Oral TID BM  . heparin  5,000 Units Subcutaneous 3 times per day  . lamiVUDine  150 mg Oral Daily  . multivitamin with minerals  1 tablet Oral Daily  . pantoprazole (PROTONIX) IV  40 mg Intravenous Q12H  .  rosuvastatin  5 mg Oral Daily  . sodium chloride  3 mL Intravenous Q12H  . zidovudine  300 mg Oral BID   PRN Meds sodium chloride, acetaminophen **OR** acetaminophen, HYDROcodone-acetaminophen, ondansetron **OR** ondansetron (ZOFRAN) IV, polyethylene glycol, sodium chloride, traMADol Results for orders placed or performed during the hospital encounter of 04/28/14 (from the past 48 hour(s))  CBC with Differential     Status: Abnormal   Collection Time: 04/28/14  7:30 AM  Result Value Ref Range   WBC 8.9 4.0 - 10.5 K/uL   RBC 2.66 (L) 4.22 - 5.81 MIL/uL   Hemoglobin 11.1 (L) 13.0 - 17.0 g/dL    Comment: REPEATED TO VERIFY   HCT 32.8 (L) 39.0 - 52.0 %   MCV 123.3 (H) 78.0 - 100.0 fL    Comment: REPEATED TO VERIFY   MCH 41.7 (H) 26.0 - 34.0 pg   MCHC 33.8 30.0 - 36.0 g/dL   RDW 11.8 11.5 - 15.5 %   Platelets 348 150 - 400 K/uL   Neutrophils Relative % 74 43 - 77 %   Lymphocytes Relative 17 12 - 46 %   Monocytes Relative 8 3 - 12 %   Eosinophils Relative 1 0 - 5 %   Basophils Relative 0 0 - 1 %   Neutro Abs 6.6 1.7 - 7.7 K/uL   Lymphs Abs 1.5 0.7 - 4.0 K/uL   Monocytes Absolute 0.7 0.1 - 1.0 K/uL   Eosinophils Absolute 0.1 0.0 - 0.7 K/uL   Basophils Absolute 0.0 0.0 - 0.1 K/uL   Smear Review PLATELET CLUMPS NOTED ON SMEAR   Comprehensive metabolic panel     Status: Abnormal   Collection Time: 04/28/14  7:30 AM  Result Value Ref Range   Sodium 134 (L) 137 - 147 mEq/L   Potassium 5.7 (H) 3.7 - 5.3 mEq/L   Chloride 96 96 - 112 mEq/L   CO2 22 19 - 32 mEq/L   Glucose, Bld 126 (H) 70 - 99 mg/dL   BUN 75 (H) 6 - 23 mg/dL   Creatinine, Ser 3.42 (H) 0.50 - 1.35 mg/dL   Calcium 9.0 8.4 - 10.5 mg/dL   Total Protein 6.8 6.0 - 8.3 g/dL   Albumin 2.2 (L) 3.5 - 5.2 g/dL   AST 16 0 - 37 U/L   ALT 16 0 - 53 U/L   Alkaline Phosphatase 84 39 - 117 U/L   Total Bilirubin 1.2 0.3 - 1.2 mg/dL   GFR calc non Af Amer 17 (L) >90 mL/min   GFR calc Af Amer 20 (L) >90 mL/min    Comment: (NOTE) The  eGFR has been calculated using the CKD EPI equation. This calculation has not been validated in all clinical situations. eGFR's persistently <90 mL/min signify possible Chronic Kidney Disease.    Anion gap 16 (H) 5 - 15  Lipase, blood     Status: None   Collection Time: 04/28/14  7:30 AM  Result Value Ref Range   Lipase 30 11 - 59 U/L  I-stat troponin, ED     Status: None   Collection Time: 04/28/14  7:43 AM  Result Value Ref Range   Troponin i, poc 0.00 0.00 - 0.08 ng/mL   Comment 3            Comment: Due to the release kinetics of cTnI, a negative result within the first hours of the onset of symptoms does not rule out myocardial infarction with certainty. If myocardial infarction is still suspected, repeat the test at appropriate intervals.   Potassium     Status: Abnormal   Collection Time: 04/28/14  8:30 AM  Result Value Ref Range   Potassium 5.6 (H) 3.7 - 5.3 mEq/L  Urinalysis, Routine w reflex microscopic     Status: Abnormal   Collection Time: 04/28/14  8:42 AM  Result Value Ref Range   Color, Urine YELLOW YELLOW   APPearance CLOUDY (A) CLEAR   Specific Gravity, Urine 1.014 1.005 - 1.030   pH 6.0 5.0 - 8.0   Glucose, UA NEGATIVE NEGATIVE mg/dL   Hgb urine dipstick SMALL (A) NEGATIVE   Bilirubin Urine NEGATIVE NEGATIVE   Ketones, ur NEGATIVE NEGATIVE mg/dL   Protein, ur 30 (A) NEGATIVE mg/dL   Urobilinogen, UA 0.2 0.0 - 1.0 mg/dL   Nitrite NEGATIVE NEGATIVE   Leukocytes, UA LARGE (A) NEGATIVE  Urine microscopic-add on     Status: Abnormal   Collection Time: 04/28/14  8:42 AM  Result Value Ref Range   Squamous Epithelial / LPF RARE RARE   WBC, UA 21-50 <3 WBC/hpf   RBC / HPF 7-10 <3 RBC/hpf   Bacteria, UA RARE RARE   Casts HYALINE CASTS (A) NEGATIVE  Sodium, urine, random     Status: None   Collection Time: 04/28/14  8:42 AM  Result Value Ref Range   Sodium, Ur 43 mEq/L  Creatinine, urine, random     Status: None   Collection Time: 04/28/14  8:42 AM   Result Value Ref Range   Creatinine, Urine 71.08 mg/dL  Hemoglobin and hematocrit, blood     Status: Abnormal   Collection Time: 04/28/14 12:51 PM  Result Value Ref Range   Hemoglobin 10.5 (L) 13.0 - 17.0 g/dL   HCT 31.4 (L) 39.0 - 52.0 %  Type and screen     Status: None   Collection Time: 04/28/14 12:55 PM  Result Value Ref Range   ABO/RH(D) A POS    Antibody Screen NEG    Sample Expiration 05/01/2014   ABO/Rh     Status: None   Collection Time: 04/28/14 12:55 PM  Result Value Ref Range   ABO/RH(D) A POS   CBC     Status: Abnormal   Collection Time: 04/28/14  4:05 PM  Result Value Ref Range   WBC 6.7 4.0 - 10.5 K/uL   RBC 2.35 (L) 4.22 - 5.81 MIL/uL   Hemoglobin 9.9 (L) 13.0 - 17.0 g/dL   HCT 29.4 (L) 39.0 - 52.0 %   MCV 125.1 (H) 78.0 - 100.0 fL   MCH 42.1 (H) 26.0 - 34.0 pg   MCHC 33.7 30.0 - 36.0 g/dL   RDW 12.1 11.5 - 15.5 %   Platelets 345 150 - 400 K/uL  Creatinine, serum     Status: Abnormal   Collection Time: 04/28/14  4:05 PM  Result Value Ref Range   Creatinine, Ser 3.23 (H) 0.50 - 1.35 mg/dL   GFR calc  non Af Amer 19 (L) >90 mL/min   GFR calc Af Amer 21 (L) >90 mL/min    Comment: (NOTE) The eGFR has been calculated using the CKD EPI equation. This calculation has not been validated in all clinical situations. eGFR's persistently <90 mL/min signify possible Chronic Kidney Disease.   TSH     Status: None   Collection Time: 04/28/14  4:05 PM  Result Value Ref Range   TSH 2.370 0.350 - 4.500 uIU/mL  ANA     Status: None   Collection Time: 04/28/14  4:05 PM  Result Value Ref Range   ANA Ser Ql NEGATIVE NEGATIVE    Comment: Performed at [facility]  Lactate dehydrogenase     Status: None   Collection Time: 04/28/14  4:05 PM  Result Value Ref Range   LDH 222 94 - 250 U/L  Vitamin B12     Status: None   Collection Time: 04/28/14  4:05 PM  Result Value Ref Range   Vitamin B-12 607 211 - 911 pg/mL    Comment: Performed at [facility]   Folate RBC     Status: Abnormal   Collection Time: 04/28/14  4:05 PM  Result Value Ref Range   RBC Folate 943 (H) >280 ng/mL    Comment: Reference range not established for pediatric patients. Performed at [facility]   CBC     Status: Abnormal   Collection Time: 04/29/14  5:27 AM  Result Value Ref Range   WBC 5.9 4.0 - 10.5 K/uL   RBC 1.90 (L) 4.22 - 5.81 MIL/uL   Hemoglobin 8.3 (L) 13.0 - 17.0 g/dL   HCT 24.5 (L) 39.0 - 52.0 %   MCV 128.9 (H) 78.0 - 100.0 fL   MCH 43.7 (H) 26.0 - 34.0 pg   MCHC 33.9 30.0 - 36.0 g/dL   RDW 12.3 11.5 - 15.5 %   Platelets 313 150 - 400 K/uL  Comprehensive metabolic panel     Status: Abnormal   Collection Time: 04/29/14  5:27 AM  Result Value Ref Range   Sodium 142 137 - 147 mEq/L   Potassium 4.6 3.7 - 5.3 mEq/L   Chloride 106 96 - 112 mEq/L   CO2 23 19 - 32 mEq/L   Glucose, Bld 102 (H) 70 - 99 mg/dL   BUN 67 (H) 6 - 23 mg/dL   Creatinine, Ser 2.89 (H) 0.50 - 1.35 mg/dL   Calcium 8.2 (L) 8.4 - 10.5 mg/dL   Total Protein 5.3 (L) 6.0 - 8.3 g/dL   Albumin 1.7 (L) 3.5 - 5.2 g/dL   AST 13 0 - 37 U/L   ALT 13 0 - 53 U/L   Alkaline Phosphatase 66 39 - 117 U/L   Total Bilirubin 0.7 0.3 - 1.2 mg/dL   GFR calc non Af Amer 21 (L) >90 mL/min   GFR calc Af Amer 25 (L) >90 mL/min    Comment: (NOTE) The eGFR has been calculated using the CKD EPI equation. This calculation has not been validated in all clinical situations. eGFR's persistently <90 mL/min signify possible Chronic Kidney Disease.    Anion gap 13 5 - 15  Hemoglobin and hematocrit, blood     Status: Abnormal   Collection Time: 04/29/14  9:58 AM  Result Value Ref Range   Hemoglobin 9.1 (L) 13.0 - 17.0 g/dL   HCT 28.8 (L) 39.0 - 52.0 %  Reticulocytes     Status: Abnormal   Collection Time: 04/29/14  9:58 AM  Result Value Ref Range   Retic Ct Pct 1.1 0.4 - 3.1 %   RBC. 3.31 (L) 4.22 - 5.81 MIL/uL   Retic Count, Manual 36.4 19.0 - 186.0 K/uL  Protein / creatinine ratio, urine      Status: Abnormal   Collection Time: 04/29/14  1:27 PM  Result Value Ref Range   Creatinine, Urine 69.87 mg/dL   Total Protein, Urine 32.7 mg/dL    Comment: NO NORMAL RANGE ESTABLISHED FOR THIS TEST   Protein Creatinine Ratio 0.47 (H) 0.00 - 0.15    Dg Chest 1 View  04/29/2014   CLINICAL DATA:  Status post right thoracentesis.  EXAM: CHEST - 1 VIEW  COMPARISON:  Two-view chest x-ray 04/28/2014.  CT chest 12/ 7/15.  FINDINGS: The heart size is normal. There is no significant interval change in a loculated right pleural effusion. A small right apical pneumothorax is evident. The left lung is clear. Emphysematous changes are noted.  IMPRESSION: 1. Similar appearance of loculated right pleural effusion. 2. Small right apical pneumothorax. Critical Value/emergent results were called by telephone at the time of interpretation on 04/29/2014 at 10:08 am to Dr. Daryll Brod, who verbally acknowledged these results.   Electronically Signed   By: Lawrence Santiago M.D.   On: 04/29/2014 10:08   Dg Chest 2 View  04/28/2014   CLINICAL DATA:  Shortness of breath and cough.  Chest pain.  EXAM: CHEST  2 VIEW  COMPARISON:  Chest x-ray and chest CT dated 04/25/2014 and chest x-ray dated 04/20/2014  FINDINGS: The multiloculated right pleural effusion has slightly increased in size. Heart size and pulmonary vascularity are normal. Emphysematous changes are noted throughout both lungs. No infiltrates or effusions on the left. No acute osseous abnormality.  IMPRESSION: Slight increase in the multiloculated right pleural effusion. Emphysema.   Electronically Signed   By: Rozetta Nunnery M.D.   On: 04/28/2014 08:39   Nm Hepatobiliary Including Gb  04/29/2014   CLINICAL DATA:  Gallstones. Abdominal pain evaluate for cystic duct obstruction.  EXAM: NUCLEAR MEDICINE HEPATOBILIARY IMAGING  TECHNIQUE: Sequential images of the abdomen were obtained out to 60 minutes following intravenous administration of radiopharmaceutical.   RADIOPHARMACEUTICALS:  5.0 Millicurie [id number] Choletec  COMPARISON:  Ultrasound from 04/28/2014  FINDINGS: Following the intravenous administration of the radiopharmaceutical there is uniform tracer uptake by the liver with clearance from blood pool. Radiotracer activity within the stomach is noted by 12 min.  IMPRESSION: 1. Patent cystic duct without evidence for acute cholecystitis.   Electronically Signed   By: Kerby Moors M.D.   On: 04/29/2014 14:43   US Abdomen Complete  04/28/2014   CLINICAL DATA:  Abdominal pain  EXAM: ULTRASOUND ABDOMEN COMPLETE  COMPARISON:  Ultrasound abdomen 04/20/2014  FINDINGS: Gallbladder: 13 mm gallstone. Mild tenderness over the gallbladder on examination. Gallbladder wall is not thickened.  Common bile duct: Diameter: 5.2 mm  Liver: No focal lesion identified. Within normal limits in parenchymal echogenicity.  IVC: No abnormality visualized.  Pancreas: Visualized portion unremarkable.  Spleen: Size and appearance within normal limits.  Right Kidney: Length: 9.4 cm. Echogenicity within normal limits. No mass or hydronephrosis visualized.  Left Kidney: Length: 9.6 cm. 7.7 mm upper pole echogenic focus may represent a stone. Echogenicity within normal limits. No mass or hydronephrosis visualized.  Abdominal aorta: No aneurysm visualized.  Other findings: Right pleural effusion  IMPRESSION: 13 mm gallstone. Mild tenderness over the gallbladder without gallbladder wall thickening.  Possible left upper pole renal  calculus.   Electronically Signed   By: Franchot Gallo M.D.   On: 04/28/2014 08:20   US Thoracentesis Asp Pleural Space W/img Guide  04/29/2014   INDICATION: Symptomatic right sided pleural effusion  EXAM: US THORACENTESIS ASP PLEURAL SPACE W/IMG GUIDE  COMPARISON:  Previous thoracentesis  MEDICATIONS: 10 cc 1% lidocaine  COMPLICATIONS: None immediate  TECHNIQUE: Informed written consent was obtained from the patient after a discussion of the risks, benefits and  alternatives to treatment. A timeout was performed prior to the initiation of the procedure.  Initial ultrasound scanning demonstrates a left loculated pleural effusion. The lower chest was prepped and draped in the usual sterile fashion. 1% lidocaine was used for local anesthesia.  Under direct ultrasound guidance, a 19 gauge, 7-cm, Yueh catheter was introduced. An ultrasound image was saved for documentation purposes. The thoracentesis was performed. The catheter was removed and a dressing was applied. The patient tolerated the procedure well without immediate post procedural complication. The patient was escorted to have an upright chest radiograph.  FINDINGS: A total of approximately 200 cc of blood tinge fluid was removed. Requested samples were sent to the laboratory.  IMPRESSION: Successful ultrasound-guided left sided thoracentesis yielding 200 cc of pleural fluid.  Starting blood pressure was 87/51 ; blood pressure dropped to 78/52 still prior to procedure. Call patient's MD. Was decided only diagnostic study would be performed.  Ending blood pressure 81/56  Read by:  Lavonia Drafts [facility]   Electronically Signed   By: Daryll Brod M.D.   On: 04/29/2014 12:19                 Blood pressure 96/58, pulse 105, temperature 97.9 F (36.6 C), temperature source Oral, resp. rate 18, height 5' 6"  (1.676 m), weight 55.339 kg (122 lb), SpO2 94 %.  Physical exam:   General--Pleasant white male and absolutely no distress  ENT--throat normal  Neck--no lymphadenopathy  Heart--regular rate and rhythm without murmurs are gallops  Lungs--clear  Abdomen--soft and completely nontender  Psych--alert and oriented gives good history   Assessment: 1. Right upper quadrant pain. I don't think this is due to his gallbladder disease but rather to his right pleural effusion. 2. Gallstones. Patient has a single gallstone in the gallbladder. I don't think this is causing problems right now 3. Rectal  bleeding. Bright red blood in the face of new onset of constipation with rapid resolution implies hemorrhoids. He has no chronic problems and had a normal colonoscopy just 2 years ago. 4. Right lower lobe mass/right pleural effusion. In the face of HIV disease are certainly many possibilities and further testing is currently planned at this time.  5. HIV. Has been in remission with medications.  Plan: 1. I would continue to treat this as hemorrhoids with Miralax is needed to keep his bowel movement soft. We will follow him along but I don't think he needs any endoscopic evaluation at this time.   Jazleen Robeck JR,Mesha Schamberger L 04/29/2014, 4:01 PM

## 2014-04-29 NOTE — Progress Notes (Signed)
Echocardiogram 2D Echocardiogram has been performed.  Joelene Millin 04/29/2014, 4:09 PM

## 2014-04-29 NOTE — Consult Note (Signed)
[address]       [place] [address]             [phone]        A[facility]am [facility] [place] Medical Record #[id number] Date of Birth: 03-30-1946  Referring: Dr. Alva Garnet Primary Care: Nena Jordan, Mammie Lorenzo, MD  Chief Complaint:    Chief Complaint  Patient presents with  . Right upper quadrant abdominal pain    History of Present Illness:     This is a 68 y.o. male with past medical history of chronic renal disease (stage III, baseline creatinine 1.9-2.2), HIV (with undetectable viral load and on antiviral therapy) who initially presented to the hospital on 04/28/2014 for abdominal pain that had been going on for at least a week.  The abdominal pain was mostly on his right side and at times, radiated to the back. He stated  nothing made the pain better. He also stated he has decreased appetite, constipation, occasional nausea, and has had an episode of emesis.He also relates some weight loss  (about 6 pounds in the last month). He denied fever, chills, cough, night sweats, or diarrhea. US of the gallbladder showed a mobile gallstone but NO cholecystitis and right pleural effusion.  He was diagnosed with gallbladder stones and was told to follow-up with surgery. He was also followed by Dr. [name]. A CT scan of the chest that was done on 04/25/2014 showed a large right pleural effusion and right lower lobe mass. He had a thoracocentesis on 04/25/2014. The fluid showed no malignancy, no organisms were seen on gram stain, and  cultures showed no growth. He had another right thoracentesis today 04/29/2014. Approximately 200 cc of blood fluid was removed. A thoracic consultation was requested with Dr. Prescott Gum. Currently, the patient is not short of breath and is not having any pain.  Current Activity/ Functional Status: Patient is independent with mobility/ambulation, transfers, ADL's, IADL's.   Zubrod Score: At the time of surgery this patient's most appropriate  activity status/level should be described as: []     0    Normal activity, no symptoms [x]     1    Restricted in physical strenuous activity but ambulatory, able to do out light work []     2    Ambulatory and capable of self care, unable to do work activities, up and about                 more than 50%  Of the time                            []     3    Only limited self care, in bed greater than 50% of waking hours []     4    Completely disabled, no self care, confined to bed or chair []     5    Moribund  Past Medical History  Diagnosis Date  . HIV (human immunodeficiency virus infection) dx'd 2010  . High cholesterol   . Bleeding per rectum   . Depression   . Chronic renal disease   . Peripheral neuropathy   . Pleural effusion on right        Meningitis   Past Surgical History  Procedure Laterality Date  . Tonsillectomy    . Thoracentesis x 2 (12/7 and 12/11) Right 04/25/2014    History   Social History  . Marital Status: Single  Spouse Name: N/A    Number of Children: N/A  . Years of Education: N/A    Social History Main Topics  . Smoking status: Former Smoker -- 1.00 packs/day for 46 years    Types: Cigarettes    Quit date: 04/24/2014  . Smokeless tobacco: Never Used  . Alcohol Use: 0.5 oz/week    1 Not specified per week     Comment: 04/28/2014 "might drink a little but only on holidays or vacations"  . Drug Use: Yes     Comment: tried some drugs in the 1970's"  . Sexual Activity: No     Comment: declined condoms    Social History Narrative   Allergies: No known drug allergies  Current Facility-Administered Medications  Medication Dose Route Frequency Provider Last Rate Last Dose  . 0.9 %  sodium chloride infusion  250 mL Intravenous PRN Charlynne Cousins, MD      . 0.9 %  sodium chloride infusion   Intravenous Continuous Elmarie Shiley, MD   Stopped at 04/29/14 1220  . acetaminophen (TYLENOL) tablet 650 mg  650 mg Oral Q6H PRN Charlynne Cousins, MD        Or  . acetaminophen (TYLENOL) suppository 650 mg  650 mg Rectal Q6H PRN Charlynne Cousins, MD      . atazanavir-cobicistat (Evotaz) 300-150 MG per tablet 1 tablet  1 tablet Oral Q breakfast Thurnell Lose, MD      . buPROPion (WELLBUTRIN XL) 24 hr tablet 150 mg  150 mg Oral Daily Charlynne Cousins, MD   150 mg at 04/28/14 1642  . cefTRIAXone (ROCEPHIN) 1 g in dextrose 5 % 50 mL IVPB - Premix  1 g Intravenous Q24H Thurnell Lose, MD   1 g at 04/29/14 1013  . cholecalciferol (VITAMIN D) tablet 2,000 Units  2,000 Units Oral Daily Charlynne Cousins, MD   2,000 Units at 04/28/14 1643  . heparin injection 5,000 Units  5,000 Units Subcutaneous 3 times per day Charlynne Cousins, MD   5,000 Units at 04/28/14 2208  . HYDROcodone-acetaminophen (NORCO/VICODIN) 5-325 MG per tablet 1 tablet  1 tablet Oral Q6H PRN Charlynne Cousins, MD      . lamiVUDine (EPIVIR) tablet 150 mg  150 mg Oral Daily Charlynne Cousins, MD   150 mg at 04/28/14 1643  . multivitamin with minerals tablet 1 tablet  1 tablet Oral Daily Charlynne Cousins, MD   1 tablet at 04/28/14 1642  . ondansetron (ZOFRAN) tablet 4 mg  4 mg Oral Q6H PRN Charlynne Cousins, MD       Or  . ondansetron [facility]) injection 4 mg  4 mg Intravenous Q6H PRN Charlynne Cousins, MD      . pantoprazole (PROTONIX) injection 40 mg  40 mg Intravenous Q12H Thurnell Lose, MD   40 mg at 04/29/14 1134  . polyethylene glycol (MIRALAX / GLYCOLAX) packet 17 g  17 g Oral Daily PRN Charlynne Cousins, MD      . rosuvastatin (CRESTOR) tablet 5 mg  5 mg Oral Daily Charlynne Cousins, MD   5 mg at 04/28/14 2208  . sodium chloride 0.9 % injection 3 mL  3 mL Intravenous Q12H Charlynne Cousins, MD   3 mL at 04/28/14 2208  . sodium chloride 0.9 % injection 3 mL  3 mL Intravenous PRN Charlynne Cousins, MD      . technetium TC 24M mebrofenin (CHOLETEC) injection Benson  5 milli Curie Intravenous Once PRN Medication Radiologist, MD      . traMADol  Veatrice Bourbon) tablet 50 mg  50 mg Oral Q8H PRN Charlynne Cousins, MD   50 mg at 04/28/14 1641  . zidovudine (RETROVIR) tablet 300 mg  300 mg Oral BID Charlynne Cousins, MD   300 mg at 04/28/14 1645    Prescriptions prior to admission  Medication Sig Dispense Refill Last Dose  . atazanavir-cobicistat (EVOTAZ) 300-150 MG per tablet Take 1 tablet by mouth daily. Swallow whole. Do NOT crush, cut or chew tablet. Take with food. 30 tablet 11 04/27/2014 at Unknown time  . buPROPion (WELLBUTRIN XL) 150 MG 24 hr tablet Take 150 mg by mouth daily.   04/27/2014 at Unknown time  . Cholecalciferol (VITAMIN D3) 2000 UNITS capsule Take 2,000 Units by mouth daily.   04/27/2014 at Unknown time  . HYDROcodone-acetaminophen (NORCO) 5-325 MG per tablet Take 1 tablet by mouth every 6 (six) hours as needed for moderate pain. 30 tablet 0 Past Week at Unknown time  . lamiVUDine (EPIVIR) 150 MG tablet Take 1 tablet (150 mg total) by mouth daily. 30 tablet 11 04/27/2014 at Unknown time  . Multiple Vitamin (MULTIVITAMIN WITH MINERALS) TABS tablet Take 1 tablet by mouth daily.   04/27/2014 at Unknown time  . rosuvastatin (CRESTOR) 5 MG tablet Take 1 tablet (5 mg total) by mouth daily. 30 tablet 11 04/27/2014 at Unknown time  . zidovudine (RETROVIR) 300 MG tablet Take 1 tablet (300 mg total) by mouth 2 (two) times daily. 60 tablet 11 04/27/2014 at Unknown time  . Multiple Vitamins-Minerals (CENTRUM SILVER PO) Take 1 tablet by mouth daily.     Not Taking    Family History  Problem Relation Age of Onset  . Heart failure Mother deceased at age 52   . Pneumonia Father deceased at age 46   Sister deceased at age 25 esophageal cancer Brother deceased at age 73 lung cancer (late stage)   Review of Systems:     Cardiac Review of Systems: Y or N  Chest Pain [ N   ]  Resting SOB [  N ] Exertional SOB  [ Y ]  Orthopnea Aqua.Slicker  ]   Pedal Edema [  N ]    Palpitations [ N ] Syncope  [ N ]   Presyncope [ N  ]  General Review of Systems: [Y]  = yes [  ]=no Constitional: recent weight change [Y  ]; anorexia [  Y]; fatigue [  N]; nausea [  Y]; night sweats [ N ]; fever [ N ]; or chills [ N ]                                                              Eye : blurred vision [ N ]; diplopia [  N ]; vision changes [ N ];  Amaurosis fugax[N  ]; Resp: cough [ N ];  wheezing[N  ];  hemoptysis[N  ];   GI:  gallstones[Y  ], vomiting[ Y ];  dysphagia[N  ]; melena[  N];  hematochezia [  N]; heartburn[ N ];   GU:  hematuria[ N ];   dysuria [ N ];  nocturia[ N ];  history of obstruction [N  ]; urinary frequency [  N ]             Skin: rash, swelling[ N ];, hair loss[  N];  or itching[ N ]; Musculosketetal: myalgias[ N ];  joint swelling[ N ];  joint erythema[ N ]; joint pain[ N];  back pain[ N ];  Heme/Lymph: bruising[ N ];  bleeding[ Y ];  anemia[  Y];  Neuro: TIA[ N ];  headaches[N  ];  stroke[N  ];  vertigo[  N];  seizures[  N];   paresthesias[ N ];  difficulty walking[ N ];  Neuropathy LE [Y]  Psych:depression[ Y ]; anxiety[ N ];  Endocrine: diabetes[N  ];  thyroid dysfunction[N  ];    Physical Exam: BP 96/58 mmHg  Pulse 105  Temp(Src) 97.9 F (36.6 C) (Oral)  Resp 18  Ht 5\' 6"  (1.676 m)  Wt 122 lb (55.339 kg)  BMI 19.70 kg/m2  SpO2 94%  General appearance: alert, cooperative and no distress HEENT: Head is atraumatic and normocephalic Eyes-EOMI, PERRLA, sclera are non icteric Neck-supple, no JVD, no carotid bruit Heart: RRR, no murmur or rub Lungs: Diminshed on right, especially at right base;left lung is clear Abdomen: soft, non-tender; bowel sounds normal; no masses,  no organomegaly Extremities: No lower extremity cyanosis or edema. Palpable DP and PT bilaterally Neurologic: intact  Diagnostic Studies & Laboratory data:     Recent Radiology Findings:   Dg Chest 1 View  04/29/2014   CLINICAL DATA:  Status post right thoracentesis.  EXAM: CHEST - 1 VIEW  COMPARISON:  Two-view chest x-ray 04/28/2014.  CT chest 12/ 7/15.   FINDINGS: The heart size is normal. There is no significant interval change in a loculated right pleural effusion. A small right apical pneumothorax is evident. The left lung is clear. Emphysematous changes are noted.  IMPRESSION: 1. Similar appearance of loculated right pleural effusion. 2. Small right apical pneumothorax. Critical Value/emergent results were called by telephone at the time of interpretation on 04/29/2014 at 10:08 am to Dr. Daryll Brod, who verbally acknowledged these results.   Electronically Signed   By: Lawrence Santiago M.D.   On: 04/29/2014 10:08   Dg Chest 2 View  04/28/2014   CLINICAL DATA:  Shortness of breath and cough.  Chest pain.  EXAM: CHEST  2 VIEW  COMPARISON:  Chest x-ray and chest CT dated 04/25/2014 and chest x-ray dated 04/20/2014  FINDINGS: The multiloculated right pleural effusion has slightly increased in size. Heart size and pulmonary vascularity are normal. Emphysematous changes are noted throughout both lungs. No infiltrates or effusions on the left. No acute osseous abnormality.  IMPRESSION: Slight increase in the multiloculated right pleural effusion. Emphysema.   Electronically Signed   By: Rozetta Nunnery M.D.   On: 04/28/2014 08:39   US Abdomen Complete  04/28/2014   CLINICAL DATA:  Abdominal pain  EXAM: ULTRASOUND ABDOMEN COMPLETE  COMPARISON:  Ultrasound abdomen 04/20/2014  FINDINGS: Gallbladder: 13 mm gallstone. Mild tenderness over the gallbladder on examination. Gallbladder wall is not thickened.  Common bile duct: Diameter: 5.2 mm  Liver: No focal lesion identified. Within normal limits in parenchymal echogenicity.  IVC: No abnormality visualized.  Pancreas: Visualized portion unremarkable.  Spleen: Size and appearance within normal limits.  Right Kidney: Length: 9.4 cm. Echogenicity within normal limits. No mass or hydronephrosis visualized.  Left Kidney: Length: 9.6 cm. 7.7 mm upper pole echogenic focus may represent a stone. Echogenicity within normal  limits. No mass or hydronephrosis visualized.  Abdominal aorta: No aneurysm visualized.  Other findings:  Right pleural effusion  IMPRESSION: 13 mm gallstone. Mild tenderness over the gallbladder without gallbladder wall thickening.  Possible left upper pole renal calculus.   Electronically Signed   By: Franchot Gallo M.D.   On: 04/28/2014 08:20   US Thoracentesis Asp Pleural Space W/img Guide  04/29/2014   INDICATION: Symptomatic right sided pleural effusion  EXAM: US THORACENTESIS ASP PLEURAL SPACE W/IMG GUIDE  COMPARISON:  Previous thoracentesis  MEDICATIONS: 10 cc 1% lidocaine  COMPLICATIONS: None immediate  TECHNIQUE: Informed written consent was obtained from the patient after a discussion of the risks, benefits and alternatives to treatment. A timeout was performed prior to the initiation of the procedure.  Initial ultrasound scanning demonstrates a left loculated pleural effusion. The lower chest was prepped and draped in the usual sterile fashion. 1% lidocaine was used for local anesthesia.  Under direct ultrasound guidance, a 19 gauge, 7-cm, Yueh catheter was introduced. An ultrasound image was saved for documentation purposes. The thoracentesis was performed. The catheter was removed and a dressing was applied. The patient tolerated the procedure well without immediate post procedural complication. The patient was escorted to have an upright chest radiograph.  FINDINGS: A total of approximately 200 cc of blood tinge fluid was removed. Requested samples were sent to the laboratory.  IMPRESSION: Successful ultrasound-guided left sided thoracentesis yielding 200 cc of pleural fluid.  Starting blood pressure was 87/51 ; blood pressure dropped to 78/52 still prior to procedure. Call patient's MD. Was decided only diagnostic study would be performed.  Ending blood pressure 81/56  Read by:  Lavonia Drafts [facility]   Electronically Signed   By: Daryll Brod M.D.   On: 04/29/2014 12:19      Recent Lab  Findings: Lab Results  Component Value Date   WBC 5.9 04/29/2014   HGB 9.1* 04/29/2014   HCT 28.8* 04/29/2014   PLT 313 04/29/2014   GLUCOSE 102* 04/29/2014   CHOL 174 01/18/2014   TRIG 294* 01/18/2014   HDL 38* 01/18/2014   LDLDIRECT 126* 08/30/2011   LDLCALC 77 01/18/2014   ALT 13 04/29/2014   AST 13 04/29/2014   NA 142 04/29/2014   K 4.6 04/29/2014   CL 106 04/29/2014   CREATININE 2.89* 04/29/2014   BUN 67* 04/29/2014   CO2 23 04/29/2014   TSH 2.370 04/28/2014   INR 1.0 10/18/2008   HGBA1C 5.3 05/25/2013      Assessment / Plan:      1. Recurrent right pleural effusion (loculated)-s/p right thoracenteses on 12/7 and 12/11. 900 cc initially removed followed by 200 cc of blood tinged fluid removed most recently. Will require right VATS, drain effusion, possible wedge resection likely Monday with Dr. Prescott Gum. 2. 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 HIV-last viral load under 40. Medication per medicine 4. Chronic macrocytic anemia 5. Acute renal failure on CKD (stage III). Per nephrology, likely related to volume depletion and NSAID use. 6. Right upper quadrant abdominal pain-US showed gallstones, no gallbladder wall thickening and no cholecystitis. LFTs within normal limits. HIDA ordered. Could be referred pain from right pleural space.Per medicine.  7. Episode of lower GI bleed (at time of admission). This may be related to hemorrhoids. H and H stable. Per medicine  Lars Pinks PA-C 04/29/2014 2:21 PM  Patient examined, CT scan of chest, 2-D echocardiogram, and PFTs reviewed. 68 year old Caucasian male smoker with COPD and treated HIV followed in the infectious disease clinic presents with 1 month history of progressive  right pleuritic chest pain, weakness, shortness of breath with nonproductive cough, loss of appetite and weight loss. Chest x-ray shows a large pleural effusion. Chest CT scan shows some consolidation in the right lower lobe but  without IV contrast is difficult to differentiate between tumor versus infection. Thoracentesis has been attempted twice with negative cytologies. The patient also has chronic renal insufficiency with a creatinine of 2.0-2.5. The patient is also developed acute anemia with probable GI blood loss and is undergoing endoscopy tomorrow. He will need to be transfused to a hemoglobin of 8.5 prior to surgery.  PFTs performed show significant COPD but adequate to tolerate VATS for pleural disease but not major pulmonary resection for malignancy.  Plan right VATS with drainage of effusion, decortication right lower lobe and biopsies of the pleura on Monday afternoon. The procedure was discussed in detail with the patient understands and agrees. Continue broad-spectrum antibiotics until surgery.

## 2014-04-29 NOTE — Procedures (Signed)
   US guided Rt thora 200 cc blood tinged fluid  Pt tolerated well BP at start of procedure 87/50  dropping to 78/52 Pt asymptomatic  Called to Dr Candiss Norse Did not perform large volume thora 200 cc for cytology per Dr Candiss Norse  cxr pending

## 2014-04-29 NOTE — Progress Notes (Signed)
Admit: 04/28/2014 LOS: 1  74M w/ AoCKD3 (BL 1.9-2.2) in setting of new R pleural effusion and mass w/ strong suspicion of malignancy.  Has HIV, well controlled.    Subjective:  276mL thoracentesis this AM  NS @ 125/hr  12/10 0701 - 12/11 0700 In: 240 [P.O.:240] Out: -   Filed Weights   04/28/14 0649  Weight: 55.339 kg (122 lb)    Scheduled Meds: . buPROPion  150 mg Oral Daily  . cefTRIAXone (ROCEPHIN)  IV  1 g Intravenous Q24H  . cholecalciferol  2,000 Units Oral Daily  . heparin  5,000 Units Subcutaneous 3 times per day  . lamiVUDine  150 mg Oral Daily  . multivitamin with minerals  1 tablet Oral Daily  . pantoprazole (PROTONIX) IV  40 mg Intravenous Q12H  . rosuvastatin  5 mg Oral Daily  . sodium chloride  3 mL Intravenous Q12H  . zidovudine  300 mg Oral BID   Continuous Infusions: . sodium chloride    . sodium chloride     PRN Meds:.sodium chloride, acetaminophen **OR** acetaminophen, HYDROcodone-acetaminophen, ondansetron **OR** ondansetron (ZOFRAN) IV, polyethylene glycol, sodium chloride, traMADol    Physical Exam:  Blood pressure 96/58, pulse 105, temperature 97.9 F (36.6 C), temperature source Oral, resp. rate 18, height 5\' 6"  (1.676 m), weight 55.339 kg (122 lb), SpO2 94 %. Current Labs: reviewed  NAD, think RRR, no rub End exp wheezes in bases bilaterally, no crakcles/rales S/nt/nd, nabs,  No LEE No rashes/lesions Nonfocal, aaox3   A/P 1. AoCKD3 1. BL SCr 1.9-2.2; no outpt nephrologist 2. On Evotaz for HIV, includes cobiscistat which impairs distal tubular secretion of SCr w/o affecting GFR 3. Was w/ N/V at home, taking 2 Aleve/d PTA 4. UA with 1+ proteinuria, and 7-10 RBC/HPF: UP/C, Complements, ANA, ANCA, SPEP pending 5. 12/10 Renal US w/o obstruction, symmetric normal sized kidneys 6. Agree this is likely  hemodynamic + Aleve 7. Cont IVFs, strict I/Os, daily renal panel, monitor; no RRT indication 2. R large pleural effusion + RLL  Mass 1. Smoker, clubbing; suspect malignancy 2. Thoracentesis today, cytology pending 3. TRH and Pulm managing  3. N/V 1. HIDA planned today after RUQ [place] with some tenderness at GB and nonobstructing stone 2. RUQ more likely referred from pleural space 4. HIV: well controlled on ARVs, as per #1 5. Hyperkalemia: resolved 1. Did rec kayexalate 12/10 2. Would not repeat unless K >6  Pearson Grippe MD 04/29/2014, 10:50 AM   Recent Labs Lab 04/28/14 0730 04/28/14 0830 04/28/14 1605 04/29/14 0527  NA 134*  --   --  142  K 5.7* 5.6*  --  4.6  CL 96  --   --  106  CO2 22  --   --  23  GLUCOSE 126*  --   --  102*  BUN 75*  --   --  67*  CREATININE 3.42*  --  3.23* 2.89*  CALCIUM 9.0  --   --  8.2*    Recent Labs Lab 04/28/14 0730  04/28/14 1605 04/29/14 0527 04/29/14 0958  WBC 8.9  --  6.7 5.9  --   NEUTROABS 6.6  --   --   --   --   HGB 11.1*  < > 9.9* 8.3* 9.1*  HCT 32.8*  < > 29.4* 24.5* 28.8*  MCV 123.3*  --  125.1* 128.9*  --   PLT 348  --  345 313  --   < > = values in this interval  not displayed.

## 2014-04-29 NOTE — Progress Notes (Signed)
INITIAL NUTRITION ASSESSMENT  Pt meets criteria for SEVERE MALNUTRITION in the context of chronic illness as evidenced by energy intake </= 75% for >/= 1 month and severe muscle mass loss.  DOCUMENTATION CODES Per approved criteria  -Severe malnutrition in the context of chronic illness   INTERVENTION: Provide butter pecan Nepro Shake po TID, each supplement provides 425 kcal and 19 grams protein.  Downgrade diet to dysphagia 2 diet. Pt currently has no teeth.  Encourage adequate PO intake.  NUTRITION DIAGNOSIS: Increased nutrition needs related to chronic illness as evidenced by estimated nutrition needs.   Goal: Pt to meet >/= 90% of their estimated nutrition needs   Monitor:  PO intake, weight trends, labs, I/O's  Reason for Assessment: MST  68 y.o. male  Admitting Dx: Acute kidney injury  ASSESSMENT: Pt with past medical history of chronic renal disease with a worsening creatinine, HIV with undetectable viral load, on antiviral therapy came into the hospital several days to admission for abdominal pain he was diagnosed with gallbladder stones. CT scan of the chest showed a mass and effusion had a thoracocentesis that showed fluid, no malignancies, he relates his abdominal pain is made worse by eating.  Procedure (12/11):THORACENTESIS [SAY30 (Custom)]  Pt reports his appetite is just "ok". Meal completion is 50%. Pt reports over the past 3-4 weeks he has been noticing that he is eating less at meals. Pt reports he has been eating 50% less at meals than usual. Pt has been eating 3 meals and drinks 1 Ensure a day. Pt reports his usual body weight is 131 lbs, where he last weighed 1 month ago. Noted pt with a 6.8% weight loss in 1 month. Pt reports he would like oral supplements ordered, but he only likes butter pecan flavor. RD to order Nepro Shake. Pt was educated to continue/increase oral supplement intake at home to aid in calorie and protein consumption and to prevent weight  loss. Pt was encouraged to eat his food at meals. Pt also reports he has been having difficulties eating his food as he currently has no teeth. RD to downgrade diet to dysphagia 2.  Nutrition Focused Physical Exam:  Subcutaneous Fat:  Orbital Region: N/A Upper Arm Region: Moderate depletion Thoracic and Lumbar Region: Moderate depletion  Muscle:  Temple Region: Severe depletion Clavicle Bone Region: Moderate depletion Clavicle and Acromion Bone Region: Moderate depletion Scapular Bone Region: N/A Dorsal Hand: Moderate depletion Patellar Region: Severe depletion Anterior Thigh Region: Severe depletion Posterior Calf Region: Severe depletion  Edema: none  Labs: Low calcium, GFR. High BUN and creatinine.  Height: Ht Readings from Last 1 Encounters:  04/28/14 5\' 6"  (1.676 m)    Weight: Wt Readings from Last 1 Encounters:  04/28/14 122 lb (55.339 kg)    Ideal Body Weight: 142 lbs  % Ideal Body Weight: 86%  Wt Readings from Last 10 Encounters:  04/28/14 122 lb (55.339 kg)  04/21/14 122 lb 6.4 oz (55.52 kg)  03/09/14 123 lb (55.792 kg)  01/18/14 123 lb 8 oz (56.019 kg)  09/07/13 128 lb 8 oz (58.287 kg)  05/25/13 129 lb (58.514 kg)  03/09/13 132 lb 8 oz (60.102 kg)  12/04/12 133 lb (60.328 kg)  09/03/12 135 lb (61.236 kg)  07/17/12 132 lb (59.875 kg)    Usual Body Weight: 131 lbs  % Usual Body Weight: 93%  BMI:  Body mass index is 19.7 kg/(m^2).  Estimated Nutritional Needs: Kcal: 1900-2100 Protein: 85-100 grams Fluid: Per MD  Skin: intact  Diet Order: Diet regular  EDUCATION NEEDS: -Education needs addressed   Intake/Output Summary (Last 24 hours) at 04/29/14 0949 Last data filed at 04/28/14 1700  Gross per 24 hour  Intake    240 ml  Output      0 ml  Net    240 ml    Last BM: 12/11  Labs:   Recent Labs Lab 04/28/14 0730 04/28/14 0830 04/28/14 1605 04/29/14 0527  NA 134*  --   --  142  K 5.7* 5.6*  --  4.6  CL 96  --   --  106  CO2  22  --   --  23  BUN 75*  --   --  67*  CREATININE 3.42*  --  3.23* 2.89*  CALCIUM 9.0  --   --  8.2*  GLUCOSE 126*  --   --  102*    CBG (last 3)  No results for input(s): GLUCAP in the last 72 hours.  Scheduled Meds: . buPROPion  150 mg Oral Daily  . cefTRIAXone (ROCEPHIN)  IV  1 g Intravenous Q24H  . cholecalciferol  2,000 Units Oral Daily  . heparin  5,000 Units Subcutaneous 3 times per day  . lamiVUDine  150 mg Oral Daily  . lidocaine (PF)      . multivitamin with minerals  1 tablet Oral Daily  . rosuvastatin  5 mg Oral Daily  . sodium chloride  3 mL Intravenous Q12H  . zidovudine  300 mg Oral BID    Continuous Infusions: . sodium chloride    . sodium chloride      Past Medical History  Diagnosis Date  . HIV (human immunodeficiency virus infection) dx'd 2010  . High cholesterol   . Bleeding per rectum   . Depression   . Chronic renal disease   . Peripheral neuropathy   . Pleural effusion on right     Past Surgical History  Procedure Laterality Date  . Tonsillectomy    . Thoracentesis Right 04/25/2014    Kallie Locks, MS, RD, LDN Pager # [phone] After hours/ weekend pager # [phone]

## 2014-04-29 NOTE — Progress Notes (Addendum)
Patient Demographics  [name] [name], is a 68 y.o. male, DOB - [birth year], OYD:[id number]  Admit date - 04/28/2014   Admitting Physician Charlynne Cousins, MD  Outpatient Primary MD for the patient is Nena Jordan, Mammie Lorenzo, MD  LOS - 1   Chief Complaint  Patient presents with  . Abdominal Pain        Subjective:   [place] today has, No headache, No chest pain, No abdominal pain - No Nausea, No new weakness tingling or numbness, No Cough - SOB.    Assessment & Plan   1. Right-sided chest wall pain. Doubt this was right upper quadrant. Likely due to recurrent pleural effusion and recent thoracentesis, pain almost completely resolved, discussed his recurrent pleural effusion with a possible lung mass with pulmonary physician Dr. Jamal Collin who requested that patient be evaluated by cardiothoracic surgery. Cardiac thoracic surgery has been consulted. His right-sided pleural effusion appears to be complex and loculated along with right lung mass.   2. HIV. In good control, last HIV viral count under 40, continue home meds.   3.? right upper quadrant pain. Likely more right-sided chest pain as in #1 above, liver enzymes stable, pain is pleuritic in nature, no association with diet, right upper quadrant ultrasound shows gallbladder stone without any thickening. HIDA scan due. If negative outpatient surgery follow-up    4. Acute renal failure with proteinuria and underlying chronic kidney disease stage III. Currently being hydrated, renal following.   5. Chronic macrocytic anemia. B-12 folate levels pending.   6. Episode of large lower GI bleed in the ER. H&H stable, this could have been internal hemorrhoidal bleed related to severe constipation, he follows with either GI would request to  evaluate one time.      Code Status: Full  Family Communication: none present  Disposition Plan: Home   Procedures right upper quadrant ultrasound, recent CT chest noted below,   Combs who requested cardiothoracic surgery, cardiac thoracic surgery consulted, renal consulted, GI   Medications  Scheduled Meds: . buPROPion  150 mg Oral Daily  . cefTRIAXone (ROCEPHIN)  IV  1 g Intravenous Q24H  . cholecalciferol  2,000 Units Oral Daily  . heparin  5,000 Units Subcutaneous 3 times per day  . lamiVUDine  150 mg Oral Daily  . multivitamin with minerals  1 tablet Oral Daily  . pantoprazole (PROTONIX) IV  40 mg Intravenous Q12H  . rosuvastatin  5 mg Oral Daily  . sodium chloride  3 mL Intravenous Q12H  . zidovudine  300 mg Oral BID   Continuous Infusions: . sodium chloride     PRN Meds:.sodium chloride, acetaminophen **OR** acetaminophen, HYDROcodone-acetaminophen, ondansetron **OR** ondansetron (ZOFRAN) IV, polyethylene glycol, sodium chloride, traMADol  DVT Prophylaxis   - Heparin    Lab Results  Component Value Date   PLT 313 04/29/2014    Antibiotics     Anti-infectives    Start     Dose/Rate Route Frequency Ordered Stop   04/29/14 1000  cefTRIAXone (ROCEPHIN) 1 g in dextrose 5 % 50 mL IVPB - Premix     1 g100 mL/hr over 30 Minutes Intravenous Every 24 hours 04/29/14 0856     04/28/14 1615  lamiVUDine (EPIVIR) tablet 150  mg     150 mg Oral Daily 04/28/14 1454     04/28/14 1600  zidovudine (RETROVIR) tablet 300 mg     300 mg Oral 2 times daily 04/28/14 1454            Objective:   Filed Vitals:   04/29/14 0510 04/29/14 0900 04/29/14 0915 04/29/14 1037  BP: 101/59 81/57 78/52  96/58  Pulse: 95   105  Temp: 98.1 F (36.7 C)   97.9 F (36.6 C)  TempSrc: Oral   Oral  Resp: 18   18  Height:      Weight:      SpO2: 98%   94%    Wt Readings from Last 3 Encounters:  04/28/14 55.339 kg (122 lb)  04/21/14 55.52 kg (122 lb 6.4 oz)    03/09/14 55.792 kg (123 lb)     Intake/Output Summary (Last 24 hours) at 04/29/14 1056 Last data filed at 04/29/14 1013  Gross per 24 hour  Intake    290 ml  Output      0 ml  Net    290 ml     Physical Exam  Awake Alert, Oriented X 3, No new F.N deficits, Normal affect Harris.AT,PERRAL Supple Neck,No JVD, No cervical lymphadenopathy appriciated.  Symmetrical Chest wall movement, Mod air movement bilaterally, reduced right basilar breath sounds RRR,No Gallops,Rubs or new Murmurs, No Parasternal Heave +ve B.Sounds, Abd Soft, No tenderness, No organomegaly appriciated, No rebound - guarding or rigidity. No Cyanosis, Clubbing or edema, No new Rash or bruise      Data Review   Micro Results Recent Results (from the past 240 hour(s))  Body fluid culture     Status: None   Collection Time: 04/25/14 11:57 AM  Result Value Ref Range Status   Specimen Description FLUID RIGHT PLEURAL  Final   Special Requests NONE  Final   Gram Stain   Final    RARE WBC PRESENT, PREDOMINANTLY PMN NO ORGANISMS SEEN Performed at [facility]    Culture   Final    NO GROWTH 3 DAYS Performed at [facility]    Report Status 04/28/2014 FINAL  Final    Radiology Reports      Dg Chest 2 View  04/28/2014   CLINICAL DATA:  Shortness of breath and cough.  Chest pain.  EXAM: CHEST  2 VIEW  COMPARISON:  Chest x-ray and chest CT dated 04/25/2014 and chest x-ray dated 04/20/2014  FINDINGS: The multiloculated right pleural effusion has slightly increased in size. Heart size and pulmonary vascularity are normal. Emphysematous changes are noted throughout both lungs. No infiltrates or effusions on the left. No acute osseous abnormality.  IMPRESSION: Slight increase in the multiloculated right pleural effusion. Emphysema.   Electronically Signed   By: Rozetta Nunnery M.D.   On: 04/28/2014 08:39      Ct Chest Wo Contrast  04/25/2014   CLINICAL DATA:  Subsequent encounter for large right pleural  effusion  EXAM: CT CHEST WITHOUT CONTRAST  TECHNIQUE: Multidetector CT imaging of the chest was performed following the standard protocol without IV contrast.  COMPARISON:  Chest x-ray from 04/25/2014.  FINDINGS: Soft tissue / Mediastinum: No axillary lymphadenopathy. 6 mm short axis prevascular lymph node associated with 13 mm short axis subcarinal lymph node. No bulky hilar lymphadenopathy. Heart size is normal. Coronary artery calcification is noted. No pericardial effusion.  Lungs / Pleura: Lung windows show advanced bilateral emphysema. There is marked nodularity along the major fissure  of the right lung. Although difficult to assess without intravenous contrast, there appears to be a 7 x 4.5 cm mass in the right lower lobe. This could represent at least in part a component of right lower lobe collapse. There is an associated large right pleural effusion. Irregular pleural thickening is seen posteriorly and laterally in the right hemi thorax.  Bones: Bone windows reveal no worrisome lytic or sclerotic osseous lesions.  Upper Abdomen:  Unremarkable.  IMPRESSION: Limited study without intravenous contrast, but there appears to be a large right lower lobe mass suggesting neoplasm. 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  Large right-sided pleural effusion.   Electronically Signed   By: Misty Stanley M.D.   On: 04/25/2014 17:15   US Abdomen Complete  04/28/2014   CLINICAL DATA:  Abdominal pain  EXAM: ULTRASOUND ABDOMEN COMPLETE  COMPARISON:  Ultrasound abdomen 04/20/2014  FINDINGS: Gallbladder: 13 mm gallstone. Mild tenderness over the gallbladder on examination. Gallbladder wall is not thickened.  Common bile duct: Diameter: 5.2 mm  Liver: No focal lesion identified. Within normal limits in parenchymal echogenicity.  IVC: No abnormality visualized.  Pancreas: Visualized portion unremarkable.  Spleen:  Size and appearance within normal limits.  Right Kidney: Length: 9.4 cm. Echogenicity within normal limits. No mass or hydronephrosis visualized.  Left Kidney: Length: 9.6 cm. 7.7 mm upper pole echogenic focus may represent a stone. Echogenicity within normal limits. No mass or hydronephrosis visualized.  Abdominal aorta: No aneurysm visualized.  Other findings: Right pleural effusion  IMPRESSION: 13 mm gallstone. Mild tenderness over the gallbladder without gallbladder wall thickening.  Possible left upper pole renal calculus.   Electronically Signed   By: Franchot Gallo M.D.   On: 04/28/2014 08:20      US Thoracentesis Asp Pleural Space W/img Guide  04/25/2014   INDICATION: Symptomatic right sided pleural effusion  EXAM: US THORACENTESIS ASP PLEURAL SPACE W/IMG GUIDE  COMPARISON:  None.  MEDICATIONS: 10 cc 1% lidocaine  COMPLICATIONS: None immediate  TECHNIQUE: Informed written consent was obtained from the patient after a discussion of the risks, benefits and alternatives to treatment. A timeout was performed prior to the initiation of the procedure.  Initial ultrasound scanning demonstrates a right pleural effusion. The lower chest was prepped and draped in the usual sterile fashion. 1% lidocaine was used for local anesthesia.  Under direct ultrasound guidance, a 19 gauge, 7-cm, Yueh catheter was introduced. An ultrasound image was saved for documentation purposes. the thoracentesis was performed. The catheter was removed and a dressing was applied. The patient tolerated the procedure well without immediate post procedural complication. The patient was escorted to have an upright chest radiograph.  FINDINGS: A total of approximately 900 cc of blood-tinged dark fluid was removed. Requested samples were sent to the laboratory.  IMPRESSION: Successful ultrasound-guided right sided thoracentesis yielding 900 cc of pleural fluid.  Inferior part of the effusion was loculated.  Patient's blood pressure lowered  during procedure; patient was asymptomatic. Procedure was stopped.  Final blood pressure was 96/61  Read by:  Lavonia Drafts [facility]   Electronically Signed   By: Aletta Edouard M.D.   On: 04/25/2014 15:20     CBC  Recent Labs Lab 04/28/14 0730 04/28/14 1251 04/28/14 1605 04/29/14 0527 04/29/14 0958  WBC 8.9  --  6.7 5.9  --   HGB 11.1* 10.5* 9.9* 8.3* 9.1*  HCT 32.8* 31.4* 29.4* 24.5*  28.8*  PLT 348  --  345 313  --   MCV 123.3*  --  125.1* 128.9*  --   MCH 41.7*  --  42.1* 43.7*  --   MCHC 33.8  --  33.7 33.9  --   RDW 11.8  --  12.1 12.3  --   LYMPHSABS 1.5  --   --   --   --   MONOABS 0.7  --   --   --   --   EOSABS 0.1  --   --   --   --   BASOSABS 0.0  --   --   --   --     Chemistries   Recent Labs Lab 04/28/14 0730 04/28/14 0830 04/28/14 1605 04/29/14 0527  NA 134*  --   --  142  K 5.7* 5.6*  --  4.6  CL 96  --   --  106  CO2 22  --   --  23  GLUCOSE 126*  --   --  102*  BUN 75*  --   --  67*  CREATININE 3.42*  --  3.23* 2.89*  CALCIUM 9.0  --   --  8.2*  AST 16  --   --  13  ALT 16  --   --  13  ALKPHOS 84  --   --  66  BILITOT 1.2  --   --  0.7   ------------------------------------------------------------------------------------------------------------------ estimated creatinine clearance is 19.7 mL/min (by C-G formula based on Cr of 2.89). ------------------------------------------------------------------------------------------------------------------ No results for input(s): HGBA1C in the last 72 hours. ------------------------------------------------------------------------------------------------------------------ No results for input(s): CHOL, HDL, LDLCALC, TRIG, CHOLHDL, LDLDIRECT in the last 72 hours. ------------------------------------------------------------------------------------------------------------------  Recent Labs  04/28/14 1605  TSH 2.370    ------------------------------------------------------------------------------------------------------------------  Recent Labs  04/28/14 1605 04/29/14 0958  VITAMINB12 607  --   RETICCTPCT  --  1.1    Coagulation profile No results for input(s): INR, PROTIME in the last 168 hours.  No results for input(s): DDIMER in the last 72 hours.  Cardiac Enzymes No results for input(s): CKMB, TROPONINI, MYOGLOBIN in the last 168 hours.  Invalid input(s): CK ------------------------------------------------------------------------------------------------------------------ Invalid input(s): POCBNP     Time Spent in minutes   35   Alea Ryer K M.D on 04/29/2014 at 10:56 AM  Between 7am to 7pm - Pager - [phone]  After 7pm go to [email or web address] - [facility] Office  [phone]

## 2014-04-30 ENCOUNTER — Inpatient Hospital Stay (HOSPITAL_COMMUNITY): Payer: Commercial Managed Care - HMO

## 2014-04-30 DIAGNOSIS — E43 Unspecified severe protein-calorie malnutrition: Secondary | ICD-10-CM | POA: Insufficient documentation

## 2014-04-30 LAB — BASIC METABOLIC PANEL
Anion gap: 11 (ref 5–15)
BUN: 53 mg/dL — AB (ref 6–23)
CO2: 22 meq/L (ref 19–32)
Calcium: 8 mg/dL — ABNORMAL LOW (ref 8.4–10.5)
Chloride: 111 mEq/L (ref 96–112)
Creatinine, Ser: 2.66 mg/dL — ABNORMAL HIGH (ref 0.50–1.35)
GFR calc Af Amer: 27 mL/min — ABNORMAL LOW (ref >90)
GFR calc non Af Amer: 23 mL/min — ABNORMAL LOW (ref >90)
GLUCOSE: 135 mg/dL — AB (ref 70–99)
POTASSIUM: 4.3 meq/L (ref 3.7–5.3)
Sodium: 144 mEq/L (ref 137–147)

## 2014-04-30 LAB — HEMOGLOBIN AND HEMATOCRIT, BLOOD
HEMATOCRIT: 27 % — AB (ref 39.0–52.0)
HEMOGLOBIN: 9 g/dL — AB (ref 13.0–17.0)

## 2014-04-30 LAB — PROTIME-INR
INR: 1.16 (ref 0.00–1.49)
Prothrombin Time: 14.9 seconds (ref 11.6–15.2)

## 2014-04-30 LAB — PREPARE RBC (CROSSMATCH)

## 2014-04-30 LAB — CBC
HCT: 20.9 % — ABNORMAL LOW (ref 39.0–52.0)
HEMOGLOBIN: 7 g/dL — AB (ref 13.0–17.0)
MCH: 42.9 pg — AB (ref 26.0–34.0)
MCHC: 33.5 g/dL (ref 30.0–36.0)
MCV: 128.2 fL — ABNORMAL HIGH (ref 78.0–100.0)
Platelets: 296 10*3/uL (ref 150–400)
RBC: 1.63 MIL/uL — AB (ref 4.22–5.81)
RDW: 12.4 % (ref 11.5–15.5)
WBC: 6.3 10*3/uL (ref 4.0–10.5)

## 2014-04-30 LAB — APTT: aPTT: 45 seconds — ABNORMAL HIGH (ref 24–37)

## 2014-04-30 MED ORDER — ZOLPIDEM TARTRATE 5 MG PO TABS
5.0000 mg | ORAL_TABLET | Freq: Every evening | ORAL | Status: DC | PRN
  Administered 2014-04-30 – 2014-05-04 (×5): 5 mg via ORAL
  Filled 2014-04-30 (×5): qty 1

## 2014-04-30 MED ORDER — SODIUM CHLORIDE 0.9 % IV SOLN
INTRAVENOUS | Status: DC
  Administered 2014-05-01: 05:00:00 via INTRAVENOUS
  Administered 2014-05-01: 500 mL via INTRAVENOUS

## 2014-04-30 MED ORDER — LAMIVUDINE 10 MG/ML PO SOLN
100.0000 mg | Freq: Every day | ORAL | Status: DC
  Administered 2014-05-01 – 2014-05-09 (×8): 100 mg via ORAL
  Filled 2014-04-30 (×10): qty 10

## 2014-04-30 MED ORDER — SODIUM CHLORIDE 0.9 % IV SOLN: Freq: Once | INTRAVENOUS | Status: DC

## 2014-04-30 MED ORDER — FUROSEMIDE 10 MG/ML IJ SOLN: 10.0000 mg | INTRAMUSCULAR | Status: DC

## 2014-04-30 MED ORDER — LEVALBUTEROL HCL 0.63 MG/3ML IN NEBU
0.6300 mg | INHALATION_SOLUTION | Freq: Four times a day (QID) | RESPIRATORY_TRACT | Status: DC | PRN
  Administered 2014-04-30 – 2014-05-04 (×5): 0.63 mg via RESPIRATORY_TRACT
  Filled 2014-04-30 (×6): qty 3

## 2014-04-30 MED ORDER — SALINE SPRAY 0.65 % NA SOLN
1.0000 | NASAL | Status: DC | PRN
  Administered 2014-04-30: 1 via NASAL
  Filled 2014-04-30: qty 44

## 2014-04-30 MED ORDER — FUROSEMIDE 10 MG/ML IJ SOLN
20.0000 mg | Freq: Once | INTRAMUSCULAR | Status: AC
  Administered 2014-04-30: 20 mg via INTRAVENOUS
  Filled 2014-04-30: qty 2

## 2014-04-30 MED ORDER — ZIDOVUDINE 100 MG PO CAPS
300.0000 mg | ORAL_CAPSULE | Freq: Two times a day (BID) | ORAL | Status: DC
  Administered 2014-04-30 – 2014-05-08 (×16): 300 mg via ORAL
  Filled 2014-04-30 (×22): qty 3

## 2014-04-30 MED ORDER — FLEET ENEMA 7-19 GM/118ML RE ENEM
1.0000 | ENEMA | Freq: Once | RECTAL | Status: AC
  Administered 2014-05-01: 1 via RECTAL
  Filled 2014-04-30: qty 1

## 2014-04-30 MED ORDER — SODIUM CHLORIDE 0.9 % IV SOLN: INTRAVENOUS | Status: DC

## 2014-04-30 NOTE — Progress Notes (Signed)
Admit: 04/28/2014 LOS: 2  45M w/ AoCKD3 (BL 1.9-2.2) in setting of new R pleural effusion and mass w/ strong suspicion of malignancy.  Has HIV, well controlled.    Subjective:  CT Surgery c/s, for VATS 12/14 Some SOB/exertional For 1u PRBC today, Hb 7.1 SCr further improved  12/11 0701 - 12/12 0700 In: 1972.9 [P.O.:600; I.V.:1322.9; IV Piggyback:50] Out: 600 [Urine:600]  Filed Weights   04/28/14 0649  Weight: 55.339 kg (122 lb)    Scheduled Meds: . sodium chloride   Intravenous Once  . atazanavir-cobicistat  1 tablet Oral Q breakfast  . buPROPion  150 mg Oral Daily  . cefTRIAXone (ROCEPHIN)  IV  1 g Intravenous Q24H  . cholecalciferol  2,000 Units Oral Daily  . feeding supplement (NEPRO CARB STEADY)  237 mL Oral TID BM  . furosemide  20 mg Intravenous Once  . heparin  5,000 Units Subcutaneous 3 times per day  . lamiVUDine  150 mg Oral Daily  . multivitamin with minerals  1 tablet Oral Daily  . pantoprazole (PROTONIX) IV  40 mg Intravenous Q12H  . rosuvastatin  5 mg Oral Daily  . sodium chloride  3 mL Intravenous Q12H  . zidovudine  300 mg Oral Q12H   Continuous Infusions: . sodium chloride 50 mL/hr at 04/30/14 0916   PRN Meds:.sodium chloride, acetaminophen **OR** acetaminophen, HYDROcodone-acetaminophen, levalbuterol, ondansetron **OR** ondansetron (ZOFRAN) IV, polyethylene glycol, sodium chloride, sodium chloride, traMADol    Physical Exam:  Blood pressure 117/67, pulse 105, temperature 98.3 F (36.8 C), temperature source Oral, resp. rate 20, height 5\' 6"  (1.676 m), weight 55.339 kg (122 lb), SpO2 98 %. Current Labs: reviewed  NAD, think RRR, no rub End exp wheezes in bases bilaterally, no crakcles/rales S/nt/nd, nabs,  No LEE No rashes/lesions Nonfocal, aaox3   A/P 1. AoCKD3; improving 1. BL SCr 1.9-2.2; no outpt nephrologist 2. On Evotaz for HIV, includes cobiscistat which impairs distal tubular secretion of SCr w/o affecting GFR 3. Was w/ N/V at home,  taking 2 Aleve/d PTA 4. UA with 1+ proteinuria, and 7-10 RBC/HPF:  1. ANA neg 2. UP/C 0.47 3. SPEP pending 5. 12/10 Renal US w/o obstruction, symmetric normal sized kidneys 6. Agree this is likely  hemodynamic + Aleve 7. Cont IVFs (NS at 34mL/hr), strict I/Os, daily renal panel, monitor; no RRT indication 2. R large pleural effusion + RLL Mass 1. Smoker, clubbing; suspect malignancy 2. CT Surgery following 3. TRH and Pulm managing  4. VATS / resection 12/14 3. N/V, RUQ pain 1. HIDA 12/11 with patent cystic duct 2. RUQ more likely referred from pleural space 4. HIV: well controlled on ARVs, as per #1 5. Hyperkalemia: resolved 1. Did rec kayexalate 12/10 2. Would not repeat unless K >6 6. Anema, BRBPR 12/10, probble hemorrhoidal 1. 1u PRBC today 2. Conttibuting to DOE  [employer] MD 04/30/2014, 9:57 AM   Recent Labs Lab 04/28/14 0730 04/28/14 0830 04/28/14 1605 04/29/14 0527 04/30/14 0730  NA 134*  --   --  142 144  K 5.7* 5.6*  --  4.6 4.3  CL 96  --   --  106 111  CO2 22  --   --  23 22  GLUCOSE 126*  --   --  102* 135*  BUN 75*  --   --  67* 53*  CREATININE 3.42*  --  3.23* 2.89* 2.66*  CALCIUM 9.0  --   --  8.2* 8.0*    Recent Labs Lab 04/28/14 0730  04/28/14 1605 04/29/14 0527 04/29/14 0958 04/30/14 0730  WBC 8.9  --  6.7 5.9  --  6.3  NEUTROABS 6.6  --   --   --   --   --   HGB 11.1*  < > 9.9* 8.3* 9.1* 7.0*  HCT 32.8*  < > 29.4* 24.5* 28.8* 20.9*  MCV 123.3*  --  125.1* 128.9*  --  128.2*  PLT 348  --  345 313  --  296  < > = values in this interval not displayed.

## 2014-04-30 NOTE — Progress Notes (Signed)
The patient has had multiple episodes of dark and bloody stools, and his hemoglobin fell to 7.0 today.  He notes that he was constipated before all this began, raising the question of a stercoral ulceration.  It is recalled that colonoscopy in April 2013 was reported as being completely normal.  On the other hand, at the time of presentation, his BUN was up to 75, substantially higher than baseline, even taking into account his chronic renal insufficiency (it was 36 a week earlier). This suggests the possibility of an upper tract source.  The patient feels somewhat better, but admits that he gets short of breath even walking to the bathroom. He awaits a VATS procedure on Monday.  Exam: The patient is sitting upright in bed in no respiratory distress and is not using any accessory muscles of respiration. He is awake, coherent, and pleasant. He is currently receiving a unit of packed red cells. He is without frank pallor, despite his anemia, and the skin is warm and dry. The chest has almost absent breath sounds on the right side and diminished on the left side, without wheezes or rales. The heart is without murmur or arrhythmia. He has a full radial pulse. The abdomen is without mass effect, guarding, or significant tenderness, including the right upper quadrant. There is no peripheral edema. Rectal exam shows dark brown stool, not frank melena, no maroon stool present.  Impression:  1. Hematochezia, now with dark stool 2. Posthemorrhagic anemia, acute 3. Multiple significant chronic medical illnesses include HIV, and now a right pleural effusion with suspicion of lung cancer, awaiting VATS  Plan:  1. Endoscopy tomorrow, using light sedation and the pediatric endoscope interview the patient's respiratory limitations, to look for an upper tract source of his blood loss. 2. Concurrent sigmoidoscopic evaluation, to look for evidence of stercoral ulceration, irritated hemorrhoids, or  proctosigmoiditis as possible causes of the patient's reported hematochezia (not present on current digital rectal exam). 3. I have discussed the nature, purpose, and risks of endoscopy with the patient and he is agreeable to proceed. He is familiar with sigmoidoscopy, via his previous colonoscopy, and is agreeable to that procedure as well.  [name], M.D. [phone]

## 2014-04-30 NOTE — Progress Notes (Signed)
Pt c/o trouble breathing. Noted bilateral wheezing. O2 sats at 95% on 2L via [place]. Respirations 18. Notified T. Rogue Bussing, NP. New orders received.

## 2014-04-30 NOTE — Progress Notes (Signed)
Patient Demographics  [name] [name], is a 68 y.o. male, DOB - [birth year], ZOX:[id number]  Admit date - 04/28/2014   Admitting Physician Charlynne Cousins, MD  Outpatient Primary MD for the patient is Nena Jordan, Mammie Lorenzo, MD  LOS - 2   Chief Complaint  Patient presents with  . Abdominal Pain        Subjective:   [place] today has, No headache, No chest pain, No abdominal pain - No Nausea, No new weakness tingling or numbness, No Cough - mild SOB.    Assessment & Plan   1. Right-sided chest wall pain. Doubt this was right upper quadrant. Likely due to recurrent large R. pleural effusion and recent thoracentesis, pain almost completely resolved, discussed his recurrent pleural effusion with a possible lung mass with pulmonary physician Dr. Alva Garnet who requested that patient be evaluated by cardiothoracic surgery. Cardiac thoracic surgery has been consulted. His right-sided pleural effusion appears to be complex and loculated along with right lung mass. Likely going for a VATS procedure on Monday.    2. HIV. In good control, last HIV viral count under 40, continue home meds.   3.? RUQ pain. Likely more right-sided chest pain as in #1 above, liver enzymes stable, pain is pleuritic in nature, no association with diet, right upper quadrant ultrasound shows gallbladder stone without any thickening. HIDA negative, outpatient surgery follow-up for gallstone.    4. Acute renal failure with proteinuria and underlying chronic kidney disease stage III. Currently being hydrated, renal following. We'll transfuse 1 unit of packed RBC on 04/30/2014 and reduce IV fluids as he is mildly short of breath. One dose of IV Lasix after packed RBC.   5. Chronic macrocytic anemia. stable B-12 and  folate.   6. Episode of large lower GI bleed in the ER. With blood loss related anemia along with delusional anemia. - Blood in stool could have been internal hemorrhoidal bleed related to severe constipation, GI consulted. H&H dropped likely due to comminution of blood loss and dilution from IV fluids on 04/30/2014. We will give him 1 unit of packed RBC transfusion on 04/30/2014 and monitor H&H.      Code Status: Full  Family Communication: none present  Disposition Plan: Home   Procedures right upper quadrant ultrasound, recent CT chest noted below, HIDA scan unremarkable. One unit packed RBC transfusion on 04/30/2014.   Consults  Keyes who requested cardiothoracic surgery, cardiac thoracic surgery consulted, renal consulted, GI Dr. Oletta Lamas    Medications  Scheduled Meds: . sodium chloride   Intravenous Once  . atazanavir-cobicistat  1 tablet Oral Q breakfast  . buPROPion  150 mg Oral Daily  . cefTRIAXone (ROCEPHIN)  IV  1 g Intravenous Q24H  . cholecalciferol  2,000 Units Oral Daily  . feeding supplement (NEPRO CARB STEADY)  237 mL Oral TID BM  . furosemide  10 mg Intravenous Q4H  . heparin  5,000 Units Subcutaneous 3 times per day  . lamiVUDine  150 mg Oral Daily  . multivitamin with minerals  1 tablet Oral Daily  . pantoprazole (PROTONIX) IV  40 mg Intravenous Q12H  . rosuvastatin  5 mg Oral Daily  . sodium chloride  3 mL Intravenous Q12H  . zidovudine  300 mg Oral Q12H   Continuous Infusions: . sodium chloride 50 mL/hr at 04/30/14 0916   PRN Meds:.sodium chloride, acetaminophen **OR** acetaminophen, HYDROcodone-acetaminophen, levalbuterol, ondansetron **OR** ondansetron (ZOFRAN) IV, polyethylene glycol, sodium chloride, sodium chloride, traMADol  DVT Prophylaxis   - Heparin    Lab Results  Component Value Date   PLT 296 04/30/2014    Antibiotics     Anti-infectives    Start     Dose/Rate Route Frequency Ordered Stop   04/30/14 1000  zidovudine  (RETROVIR) capsule 300 mg     300 mg Oral Every 12 hours 04/30/14 0922     04/29/14 1800  atazanavir-cobicistat (Evotaz) 300-150 MG per tablet 1 tablet     1 tablet Oral Daily with breakfast 04/29/14 1408     04/29/14 1000  cefTRIAXone (ROCEPHIN) 1 g in dextrose 5 % 50 mL IVPB - Premix     1 g100 mL/hr over 30 Minutes Intravenous Every 24 hours 04/29/14 0856     04/28/14 1615  lamiVUDine (EPIVIR) tablet 150 mg     150 mg Oral Daily 04/28/14 1454     04/28/14 1600  zidovudine (RETROVIR) tablet 300 mg  Status:  Discontinued     300 mg Oral 2 times daily 04/28/14 1454 04/30/14 0921          Objective:   Filed Vitals:   04/29/14 1655 04/29/14 2157 04/30/14 0434 04/30/14 0508  BP: 121/56 106/50 121/60   Pulse: 119 115 115   Temp: 98.1 F (36.7 C) 99.1 F (37.3 C) 98.4 F (36.9 C)   TempSrc: Oral Oral Oral   Resp: 26 19 18    Height:      Weight:      SpO2: 91% 97% 95% 96%    Wt Readings from Last 3 Encounters:  04/28/14 55.339 kg (122 lb)  04/21/14 55.52 kg (122 lb 6.4 oz)  03/09/14 55.792 kg (123 lb)     Intake/Output Summary (Last 24 hours) at 04/30/14 0940 Last data filed at 04/30/14 0910  Gross per 24 hour  Intake 1972.92 ml  Output    800 ml  Net 1172.92 ml     Physical Exam  Awake Alert, Oriented X 3, No new F.N deficits, Normal affect [place].AT,PERRAL Supple Neck,No JVD, No cervical lymphadenopathy appriciated.  Symmetrical Chest wall movement, Mod air movement bilaterally, reduced right basilar breath sounds RRR,No Gallops,Rubs or new Murmurs, No Parasternal Heave +ve B.Sounds, Abd Soft, No tenderness, No organomegaly appriciated, No rebound - guarding or rigidity. No Cyanosis, Clubbing or edema, No new Rash or bruise      Data Review   Micro Results Recent Results (from the past 240 hour(s))  Body fluid culture     Status: None   Collection Time: 04/25/14 11:57 AM  Result Value Ref Range Status   Specimen Description FLUID RIGHT PLEURAL  Final    Special Requests NONE  Final   Gram Stain   Final    RARE WBC PRESENT, PREDOMINANTLY PMN NO ORGANISMS SEEN Performed at [facility]    Culture   Final    NO GROWTH 3 DAYS Performed at [facility]    Report Status 04/28/2014 FINAL  Final    Radiology Reports      Dg Chest 2 View  04/28/2014   CLINICAL DATA:  Shortness of breath and cough.  Chest pain.  EXAM: CHEST  2 VIEW  COMPARISON:  Chest x-ray and chest CT dated 04/25/2014 and chest x-ray dated 04/20/2014  FINDINGS: The multiloculated right pleural effusion has slightly increased in size. Heart size and pulmonary vascularity are normal. Emphysematous changes are noted throughout both lungs. No infiltrates or effusions on the left. No acute osseous abnormality.  IMPRESSION: Slight increase in the multiloculated right pleural effusion. Emphysema.   Electronically Signed   By: Rozetta Nunnery M.D.   On: 04/28/2014 08:39      Ct Chest Wo Contrast  04/25/2014   CLINICAL DATA:  Subsequent encounter for large right pleural effusion  EXAM: CT CHEST WITHOUT CONTRAST  TECHNIQUE: Multidetector CT imaging of the chest was performed following the standard protocol without IV contrast.  COMPARISON:  Chest x-ray from 04/25/2014.  FINDINGS: Soft tissue / Mediastinum: No axillary lymphadenopathy. 6 mm short axis prevascular lymph node associated with 13 mm short axis subcarinal lymph node. No bulky hilar lymphadenopathy. Heart size is normal. Coronary artery calcification is noted. No pericardial effusion.  Lungs / Pleura: Lung windows show advanced bilateral emphysema. There is marked nodularity along the major fissure of the right lung. Although difficult to assess without intravenous contrast, there appears to be a 7 x 4.5 cm mass in the right lower lobe. This could represent at least in part a component of right lower lobe collapse. There is an associated large right pleural effusion. Irregular pleural thickening is seen posteriorly and  laterally in the right hemi thorax.  Bones: Bone windows reveal no worrisome lytic or sclerotic osseous lesions.  Upper Abdomen:  Unremarkable.  IMPRESSION: Limited study without intravenous contrast, but there appears to be a large right lower lobe mass suggesting neoplasm. 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  Large right-sided pleural effusion.   Electronically Signed   By: Misty Stanley M.D.   On: 04/25/2014 17:15   US Abdomen Complete  04/28/2014   CLINICAL DATA:  Abdominal pain  EXAM: ULTRASOUND ABDOMEN COMPLETE  COMPARISON:  Ultrasound abdomen 04/20/2014  FINDINGS: Gallbladder: 13 mm gallstone. Mild tenderness over the gallbladder on examination. Gallbladder wall is not thickened.  Common bile duct: Diameter: 5.2 mm  Liver: No focal lesion identified. Within normal limits in parenchymal echogenicity.  IVC: No abnormality visualized.  Pancreas: Visualized portion unremarkable.  Spleen: Size and appearance within normal limits.  Right Kidney: Length: 9.4 cm. Echogenicity within normal limits. No mass or hydronephrosis visualized.  Left Kidney: Length: 9.6 cm. 7.7 mm upper pole echogenic focus may represent a stone. Echogenicity within normal limits. No mass or hydronephrosis visualized.  Abdominal aorta: No aneurysm visualized.  Other findings: Right pleural effusion  IMPRESSION: 13 mm gallstone. Mild tenderness over the gallbladder without gallbladder wall thickening.  Possible left upper pole renal calculus.   Electronically Signed   By: Franchot Gallo M.D.   On: 04/28/2014 08:20      US Thoracentesis Asp Pleural Space W/img Guide  04/25/2014   INDICATION: Symptomatic right sided pleural effusion  EXAM: US THORACENTESIS ASP PLEURAL SPACE W/IMG GUIDE  COMPARISON:  None.  MEDICATIONS: 10 cc 1% lidocaine  COMPLICATIONS: None immediate  TECHNIQUE: Informed written consent was obtained from the  patient after a discussion of the risks, benefits and alternatives to treatment. A timeout was performed prior to the initiation of the procedure.  Initial ultrasound scanning demonstrates a right pleural effusion. The lower chest was prepped and draped in the usual sterile fashion. 1% lidocaine was used for local anesthesia.  Under direct ultrasound guidance, a 19 gauge,  7-cm, Yueh catheter was introduced. An ultrasound image was saved for documentation purposes. the thoracentesis was performed. The catheter was removed and a dressing was applied. The patient tolerated the procedure well without immediate post procedural complication. The patient was escorted to have an upright chest radiograph.  FINDINGS: A total of approximately 900 cc of blood-tinged dark fluid was removed. Requested samples were sent to the laboratory.  IMPRESSION: Successful ultrasound-guided right sided thoracentesis yielding 900 cc of pleural fluid.  Inferior part of the effusion was loculated.  Patient's blood pressure lowered during procedure; patient was asymptomatic. Procedure was stopped.  Final blood pressure was 96/61  Read by:  Lavonia Drafts [facility]   Electronically Signed   By: Aletta Edouard M.D.   On: 04/25/2014 15:20     CBC  Recent Labs Lab 04/28/14 0730 04/28/14 1251 04/28/14 1605 04/29/14 0527 04/29/14 0958 04/30/14 0730  WBC 8.9  --  6.7 5.9  --  6.3  HGB 11.1* 10.5* 9.9* 8.3* 9.1* 7.0*  HCT 32.8* 31.4* 29.4* 24.5* 28.8* 20.9*  PLT 348  --  345 313  --  296  MCV 123.3*  --  125.1* 128.9*  --  128.2*  MCH 41.7*  --  42.1* 43.7*  --  42.9*  MCHC 33.8  --  33.7 33.9  --  33.5  RDW 11.8  --  12.1 12.3  --  12.4  LYMPHSABS 1.5  --   --   --   --   --   MONOABS 0.7  --   --   --   --   --   EOSABS 0.1  --   --   --   --   --   BASOSABS 0.0  --   --   --   --   --     Chemistries   Recent Labs Lab 04/28/14 0730 04/28/14 0830 04/28/14 1605 04/29/14 0527 04/30/14 0730  NA 134*  --   --  142 144  K  5.7* 5.6*  --  4.6 4.3  CL 96  --   --  106 111  CO2 22  --   --  23 22  GLUCOSE 126*  --   --  102* 135*  BUN 75*  --   --  67* 53*  CREATININE 3.42*  --  3.23* 2.89* 2.66*  CALCIUM 9.0  --   --  8.2* 8.0*  AST 16  --   --  13  --   ALT 16  --   --  13  --   ALKPHOS 84  --   --  66  --   BILITOT 1.2  --   --  0.7  --    ------------------------------------------------------------------------------------------------------------------ estimated creatinine clearance is 21.4 mL/min (by C-G formula based on Cr of 2.66). ------------------------------------------------------------------------------------------------------------------ No results for input(s): HGBA1C in the last 72 hours. ------------------------------------------------------------------------------------------------------------------ No results for input(s): CHOL, HDL, LDLCALC, TRIG, CHOLHDL, LDLDIRECT in the last 72 hours. ------------------------------------------------------------------------------------------------------------------  Recent Labs  04/28/14 1605  TSH 2.370   ------------------------------------------------------------------------------------------------------------------  Recent Labs  04/28/14 1605 04/29/14 0958  VITAMINB12 607 423  FOLATE  --  10.8  FERRITIN  --  429*  TIBC  --  166*  IRON  --  39*  RETICCTPCT  --  1.1    Coagulation profile No results for input(s): INR, PROTIME in the last 168 hours.  No results for input(s): DDIMER in the last 72 hours.  Cardiac Enzymes No results  for input(s): CKMB, TROPONINI, MYOGLOBIN in the last 168 hours.  Invalid input(s): CK ------------------------------------------------------------------------------------------------------------------ Invalid input(s): POCBNP     Time Spent in minutes   35   Jatavius Ellenwood K M.D on 04/30/2014 at 9:40 AM  Between 7am to 7pm - Pager - [phone]  After 7pm go to [email or web address] - [facility] Office  [phone]

## 2014-05-01 ENCOUNTER — Encounter (HOSPITAL_COMMUNITY)
Admission: EM | Disposition: A | Discharge status: Skilled Nursing Facility | Payer: Self-pay | Source: Home / Self Care | Attending: Pulmonary Disease

## 2014-05-01 ENCOUNTER — Encounter (HOSPITAL_COMMUNITY): Payer: Self-pay | Admitting: Gastroenterology

## 2014-05-01 DIAGNOSIS — D62 Acute posthemorrhagic anemia: Secondary | ICD-10-CM

## 2014-05-01 DIAGNOSIS — K264 Chronic or unspecified duodenal ulcer with hemorrhage: Secondary | ICD-10-CM

## 2014-05-01 HISTORY — PX: FLEXIBLE SIGMOIDOSCOPY: SHX5431

## 2014-05-01 HISTORY — PX: ESOPHAGOGASTRODUODENOSCOPY: SHX5428

## 2014-05-01 LAB — CBC
HCT: 27.4 % — ABNORMAL LOW (ref 39.0–52.0)
HCT: 27.7 % — ABNORMAL LOW (ref 39.0–52.0)
HEMOGLOBIN: 9.3 g/dL — AB (ref 13.0–17.0)
HEMOGLOBIN: 9.4 g/dL — AB (ref 13.0–17.0)
MCH: 38.4 pg — ABNORMAL HIGH (ref 26.0–34.0)
MCH: 37.8 pg — AB (ref 26.0–34.0)
MCHC: 33.9 g/dL (ref 30.0–36.0)
MCHC: 33.9 g/dL (ref 30.0–36.0)
MCV: 113.2 fL — ABNORMAL HIGH (ref 78.0–100.0)
MCV: 111.2 fL — ABNORMAL HIGH (ref 78.0–100.0)
Platelets: 295 10*3/uL (ref 150–400)
Platelets: 330 10*3/uL (ref 150–400)
RBC: 2.42 MIL/uL — ABNORMAL LOW (ref 4.22–5.81)
RBC: 2.49 MIL/uL — ABNORMAL LOW (ref 4.22–5.81)
RDW: 21 % — ABNORMAL HIGH (ref 11.5–15.5)
WBC: 6.1 10*3/uL (ref 4.0–10.5)
WBC: 6.2 10*3/uL (ref 4.0–10.5)

## 2014-05-01 LAB — BASIC METABOLIC PANEL
ANION GAP: 13 (ref 5–15)
Anion gap: 14 (ref 5–15)
BUN: 43 mg/dL — AB (ref 6–23)
BUN: 38 mg/dL — ABNORMAL HIGH (ref 6–23)
CHLORIDE: 105 meq/L (ref 96–112)
CO2: 24 mEq/L (ref 19–32)
CO2: 26 meq/L (ref 19–32)
Calcium: 8.5 mg/dL (ref 8.4–10.5)
Calcium: 8.3 mg/dL — ABNORMAL LOW (ref 8.4–10.5)
Chloride: 103 mEq/L (ref 96–112)
Creatinine, Ser: 2.47 mg/dL — ABNORMAL HIGH (ref 0.50–1.35)
Creatinine, Ser: 2.17 mg/dL — ABNORMAL HIGH (ref 0.50–1.35)
GFR calc Af Amer: 35 mL/min — ABNORMAL LOW (ref >90)
GFR calc non Af Amer: 26 mL/min — ABNORMAL LOW (ref >90)
GFR calc non Af Amer: 30 mL/min — ABNORMAL LOW (ref >90)
GFR, EST AFRICAN AMERICAN: 30 mL/min — AB (ref >90)
Glucose, Bld: 99 mg/dL (ref 70–99)
Glucose, Bld: 135 mg/dL — ABNORMAL HIGH (ref 70–99)
Potassium: 4 mEq/L (ref 3.7–5.3)
Potassium: 4.5 mEq/L (ref 3.7–5.3)
SODIUM: 142 meq/L (ref 137–147)
Sodium: 143 mEq/L (ref 137–147)

## 2014-05-01 LAB — TYPE AND SCREEN
ABO/RH(D): A POS
Antibody Screen: NEGATIVE
UNIT DIVISION: 0

## 2014-05-01 SURGERY — SIGMOIDOSCOPY, FLEXIBLE
Anesthesia: Moderate Sedation

## 2014-05-01 SURGERY — EGD (ESOPHAGOGASTRODUODENOSCOPY)
Anesthesia: Moderate Sedation

## 2014-05-01 MED ORDER — MIDAZOLAM HCL 5 MG/ML IJ SOLN
INTRAMUSCULAR | Status: AC
  Filled 2014-05-01: qty 1

## 2014-05-01 MED ORDER — SODIUM CHLORIDE 0.9 % IV SOLN
40.0000 mg | Freq: Once | INTRAVENOUS | Status: AC
  Administered 2014-05-01: 40 mg via INTRAVENOUS
  Filled 2014-05-01: qty 4

## 2014-05-01 MED ORDER — FAMOTIDINE 200 MG/20ML IV SOLN
40.0000 mg | INTRAVENOUS | Status: DC
  Administered 2014-05-02 – 2014-05-03 (×2): 40 mg via INTRAVENOUS
  Filled 2014-05-01 (×2): qty 4

## 2014-05-01 MED ORDER — FENTANYL CITRATE 0.05 MG/ML IJ SOLN
INTRAMUSCULAR | Status: AC
  Filled 2014-05-01: qty 2

## 2014-05-01 MED ORDER — PANTOPRAZOLE SODIUM 40 MG IV SOLR
40.0000 mg | Freq: Every day | INTRAVENOUS | Status: DC
  Administered 2014-05-02: 40 mg via INTRAVENOUS
  Filled 2014-05-01 (×2): qty 40

## 2014-05-01 MED ORDER — DIPHENHYDRAMINE HCL 50 MG/ML IJ SOLN
INTRAMUSCULAR | Status: AC
  Filled 2014-05-01: qty 1

## 2014-05-01 MED ORDER — MIDAZOLAM HCL 10 MG/2ML IJ SOLN
INTRAMUSCULAR | Status: DC | PRN
Start: 2014-05-01 — End: 2014-05-01
  Administered 2014-05-01 (×4): .5 mg via INTRAVENOUS

## 2014-05-01 MED ORDER — SUCRALFATE 1 GM/10ML PO SUSP
1.0000 g | Freq: Three times a day (TID) | ORAL | Status: DC
  Administered 2014-05-01 – 2014-05-09 (×27): 1 g via ORAL
  Filled 2014-05-01 (×38): qty 10

## 2014-05-01 NOTE — Progress Notes (Signed)
PATIENT DETAILS Name: [name] [name] Age: 68 y.o. Sex: male Date of Birth: 1945-08-31 Admit Date: 04/28/2014 Admitting Physician Charlynne Cousins, MD [id number] Kelton Pillar, Mammie Lorenzo, MD  Subjective: No major complaints. Claims he had brown stools with colonoscopy prep  Assessment/Plan: Principal Problem: Large right pleural effusion with right lower lobe mass: Outpatient thoracocentesis suggestive of exudative effusion, cytology negative for malignancy. Seen by cardiothoracic surgery, plans are for right VATS with drainage of effusion, decortication right lower lobe and biopsies of the pleura once GI bleed has completely resolved.  Active Problems: Upper GI bleed: Occurred post admission-patient had multiple dark and bloody stools. This was accompanied by a significant drop in hemoglobin. GI was consulted, patient underwent EGD/sigmoidoscopy on 12/13-EGD showed 2 large duodenal ulcer with stigmata of recent bleeding. Sigmoidoscopy was essentially negative. Currently on PPI and Carafate. Remains a high risk of rebleeding for the next 24 hours per GI. Continue to monitor closely, follow CBC. No further NSAID use-apparently was using Aleve to alleviate RUQ pain.  Acute blood loss anemia: Secondary to above, patient is status post PRBC transfusion. Since high risk of rebleeding in the next 24 hours, continue to monitor CBC closely.  Right upper quadrant abdominal pain: In retrospect, Likely secondary to duodenal ulcer. Abdominal ultrasound shows cholelithiasis without any acute features, HIDA scan is negative. Patient can follow up with general surgery as an outpatient if desired.  Acute on chronic kidney disease: Acute renal failure likely secondary to recent GI bleeding, recent use of nonsteroidal anti-inflammatory medications. Creatinine much better and close her usual baseline. Monitor lites periodically.    Human immunodeficiency virus (HIV) disease: Continue antiretrovirals, last  CD4 count on 01/18/14-270.    Protein-calorie malnutrition, severe: Continue supplements  Disposition: Remain inpatient  Antibiotics:  See below   Anti-infectives    Start     Dose/Rate Route Frequency Ordered Stop   05/01/14 1000  lamiVUDine (EPIVIR) 10 MG/ML solution 100 mg     100 mg Oral Daily 04/30/14 1354     04/30/14 1000  zidovudine (RETROVIR) capsule 300 mg     300 mg Oral Every 12 hours 04/30/14 0922     04/29/14 1800  atazanavir-cobicistat (Evotaz) 300-150 MG per tablet 1 tablet     1 tablet Oral Daily with breakfast 04/29/14 1408     04/29/14 1000  cefTRIAXone (ROCEPHIN) 1 g in dextrose 5 % 50 mL IVPB - Premix     1 g100 mL/hr over 30 Minutes Intravenous Every 24 hours 04/29/14 0856     04/28/14 1615  lamiVUDine (EPIVIR) tablet 150 mg  Status:  Discontinued     150 mg Oral Daily 04/28/14 1454 04/30/14 1354   04/28/14 1600  zidovudine (RETROVIR) tablet 300 mg  Status:  Discontinued     300 mg Oral 2 times daily 04/28/14 1454 04/30/14 3220      DVT Prophylaxis: SCD's given GI bleed  Code Status: Full code  Family Communication None at bedside  Procedures:  EGD/Sigmoidoscopy 12/13  CONSULTS:  GI, nephrology and CTVS  Time spent 40 minutes-which includes 50% of the time with face-to-face with patient/ family and coordinating care related to the above assessment and plan.  MEDICATIONS: Scheduled Meds: . atazanavir-cobicistat  1 tablet Oral Q breakfast  . buPROPion  150 mg Oral Daily  . cefTRIAXone (ROCEPHIN)  IV  1 g Intravenous Q24H  . cholecalciferol  2,000 Units Oral Daily  . famotidine (PEPCID) IV  40 mg Intravenous Once  . [  START ON 05/02/2014] famotidine (PEPCID) IV  40 mg Intravenous Q24H  . feeding supplement (NEPRO CARB STEADY)  237 mL Oral TID BM  . lamiVUDine  100 mg Oral Daily  . multivitamin with minerals  1 tablet Oral Daily  . [START ON 05/02/2014] pantoprazole (PROTONIX) IV  40 mg Intravenous QHS  . rosuvastatin  5 mg Oral Daily  .  sodium chloride  3 mL Intravenous Q12H  . sucralfate  1 g Oral TID WC & HS  . zidovudine  300 mg Oral Q12H   Continuous Infusions:  PRN Meds:.sodium chloride, acetaminophen **OR** acetaminophen, HYDROcodone-acetaminophen, levalbuterol, ondansetron **OR** ondansetron (ZOFRAN) IV, polyethylene glycol, sodium chloride, sodium chloride, traMADol, zolpidem    PHYSICAL EXAM: Vital signs in last 24 hours: Filed Vitals:   05/01/14 0805 05/01/14 0810 05/01/14 0845 05/01/14 1010  BP: 106/59 110/65 99/61 109/64  Pulse: 110 104 65 108  Temp:   97.5 F (36.4 C) 98.1 F (36.7 C)  TempSrc:   Oral Oral  Resp: 23 24 23 22   Height:      Weight:      SpO2: 98% 98% 98% 91%    Weight change:  Filed Weights   04/28/14 0649  Weight: 55.339 kg (122 lb)   Body mass index is 19.7 kg/(m^2).   Gen Exam: Awake and alert with clear speech. Neck: Supple, No JVD.  Chest: B/L Clear.   CVS: S1 S2 Regular, no murmurs.  Abdomen: soft, BS +, non tender, non distended.  Extremities: no edema, lower extremities warm to touch Neurologic: Non Focal.   Skin: No Rash.   Wounds: N/A.    Intake/Output from previous day:  Intake/Output Summary (Last 24 hours) at 05/01/14 1722 Last data filed at 05/01/14 1500  Gross per 24 hour  Intake    630 ml  Output   2100 ml  Net  -1470 ml     LAB RESULTS: CBC  Recent Labs Lab 04/28/14 0730  04/28/14 1605 04/29/14 0527 04/29/14 0958 04/30/14 0550 04/30/14 0730 05/01/14 0430  WBC 8.9  --  6.7 5.9  --   --  6.3 6.1  HGB 11.1*  < > 9.9* 8.3* 9.1* 9.0* 7.0* 9.3*  HCT 32.8*  < > 29.4* 24.5* 28.8* 27.0* 20.9* 27.4*  PLT 348  --  345 313  --   --  296 295  MCV 123.3*  --  125.1* 128.9*  --   --  128.2* 113.2*  MCH 41.7*  --  42.1* 43.7*  --   --  42.9* 38.4*  MCHC 33.8  --  33.7 33.9  --   --  33.5 33.9  RDW 11.8  --  12.1 12.3  --   --  12.4  --   LYMPHSABS 1.5  --   --   --   --   --   --   --   MONOABS 0.7  --   --   --   --   --   --   --   EOSABS 0.1   --   --   --   --   --   --   --   BASOSABS 0.0  --   --   --   --   --   --   --   < > = values in this interval not displayed.  Chemistries   Recent Labs Lab 04/28/14 0730 04/28/14 0830 04/28/14 1605 04/29/14 0527 04/30/14 0730 05/01/14 0430  NA 134*  --   --  142 144 143  K 5.7* 5.6*  --  4.6 4.3 4.0  CL 96  --   --  106 111 105  CO2 22  --   --  23 22 24   GLUCOSE 126*  --   --  102* 135* 99  BUN 75*  --   --  67* 53* 43*  CREATININE 3.42*  --  3.23* 2.89* 2.66* 2.47*  CALCIUM 9.0  --   --  8.2* 8.0* 8.5    CBG: No results for input(s): GLUCAP in the last 168 hours.  GFR Estimated Creatinine Clearance: 23 mL/min (by C-G formula based on Cr of 2.47).  Coagulation profile  Recent Labs Lab 04/30/14 0550  INR 1.16    Cardiac Enzymes No results for input(s): CKMB, TROPONINI, MYOGLOBIN in the last 168 hours.  Invalid input(s): CK  Invalid input(s): POCBNP No results for input(s): DDIMER in the last 72 hours. No results for input(s): HGBA1C in the last 72 hours. No results for input(s): CHOL, HDL, LDLCALC, TRIG, CHOLHDL, LDLDIRECT in the last 72 hours. No results for input(s): TSH, T4TOTAL, T3FREE, THYROIDAB in the last 72 hours.  Invalid input(s): FREET3  Recent Labs  04/29/14 0958  VITAMINB12 423  FOLATE 10.8  FERRITIN 429*  TIBC 166*  IRON 39*  RETICCTPCT 1.1   No results for input(s): LIPASE, AMYLASE in the last 72 hours.  Urine Studies No results for input(s): UHGB, CRYS in the last 72 hours.  Invalid input(s): UACOL, UAPR, USPG, UPH, UTP, UGL, UKET, UBIL, UNIT, UROB, ULEU, UEPI, UWBC, URBC, UBAC, CAST, UCOM, BILUA  MICROBIOLOGY: Recent Results (from the past 240 hour(s))  Body fluid culture     Status: None   Collection Time: 04/25/14 11:57 AM  Result Value Ref Range Status   Specimen Description FLUID RIGHT PLEURAL  Final   Special Requests NONE  Final   Gram Stain   Final    RARE WBC PRESENT, PREDOMINANTLY PMN NO ORGANISMS  SEEN Performed at [facility]    Culture   Final    NO GROWTH 3 DAYS Performed at [facility]    Report Status 04/28/2014 FINAL  Final    RADIOLOGY STUDIES/RESULTS: Dg Chest 1 View  04/29/2014   CLINICAL DATA:  Status post right thoracentesis.  EXAM: CHEST - 1 VIEW  COMPARISON:  Two-view chest x-ray 04/28/2014.  CT chest 12/ 7/15.  FINDINGS: The heart size is normal. There is no significant interval change in a loculated right pleural effusion. A small right apical pneumothorax is evident. The left lung is clear. Emphysematous changes are noted.  IMPRESSION: 1. Similar appearance of loculated right pleural effusion. 2. Small right apical pneumothorax. Critical Value/emergent results were called by telephone at the time of interpretation on 04/29/2014 at 10:08 am to Dr. Daryll Brod, who verbally acknowledged these results.   Electronically Signed   By: Lawrence Santiago M.D.   On: 04/29/2014 10:08   Dg Chest 1 View  04/25/2014   CLINICAL DATA:  Post thoracentesis  EXAM: CHEST - 1 VIEW  COMPARISON:  Radiograph 04/20/2014  FINDINGS: Normal cardiac silhouette. There is a large volume right pleural effusion which is not changed significantly compared to prior. No pneumothorax. Left lung is clear.  IMPRESSION: 1. Persistent large right pleural effusion. 2. No evidence of pneumothorax following thoracentesis   Electronically Signed   By: Suzy Bouchard M.D.   On: 04/25/2014 13:57   Dg Chest 2 View  04/28/2014   CLINICAL DATA:  Shortness of breath and cough.  Chest pain.  EXAM: CHEST  2 VIEW  COMPARISON:  Chest x-ray and chest CT dated 04/25/2014 and chest x-ray dated 04/20/2014  FINDINGS: The multiloculated right pleural effusion has slightly increased in size. Heart size and pulmonary vascularity are normal. Emphysematous changes are noted throughout both lungs. No infiltrates or effusions on the left. No acute osseous abnormality.  IMPRESSION: Slight increase in the multiloculated  right pleural effusion. Emphysema.   Electronically Signed   By: Rozetta Nunnery M.D.   On: 04/28/2014 08:39   Dg Chest 2 View  04/20/2014   CLINICAL DATA:  Short of breath  EXAM: CHEST  2 VIEW  COMPARISON:  Chest x-ray of 10/18/2008  FINDINGS: There is a moderate size right pleural effusion now present with resultant right lower lobe and right middle lobe partial atelectasis with compression. Pneumonia in the right middle lobe and/or right lower lobe cannot be excluded. Apical pleural thickening is again noted. The left lung is clear. Mediastinal and hilar contours are unchanged and the heart is within normal limits in size. No bony abnormality is seen.  IMPRESSION: Moderate size right pleural effusion with right basilar atelectasis.   Electronically Signed   By: Ivar Drape M.D.   On: 04/20/2014 15:59   Ct Chest Wo Contrast  04/25/2014   CLINICAL DATA:  Subsequent encounter for large right pleural effusion  EXAM: CT CHEST WITHOUT CONTRAST  TECHNIQUE: Multidetector CT imaging of the chest was performed following the standard protocol without IV contrast.  COMPARISON:  Chest x-ray from 04/25/2014.  FINDINGS: Soft tissue / Mediastinum: No axillary lymphadenopathy. 6 mm short axis prevascular lymph node associated with 13 mm short axis subcarinal lymph node. No bulky hilar lymphadenopathy. Heart size is normal. Coronary artery calcification is noted. No pericardial effusion.  Lungs / Pleura: Lung windows show advanced bilateral emphysema. There is marked nodularity along the major fissure of the right lung. Although difficult to assess without intravenous contrast, there appears to be a 7 x 4.5 cm mass in the right lower lobe. This could represent at least in part a component of right lower lobe collapse. There is an associated large right pleural effusion. Irregular pleural thickening is seen posteriorly and laterally in the right hemi thorax.  Bones: Bone windows reveal no worrisome lytic or sclerotic osseous  lesions.  Upper Abdomen:  Unremarkable.  IMPRESSION: Limited study without intravenous contrast, but there appears to be a large right lower lobe mass suggesting neoplasm. 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  Large right-sided pleural effusion.   Electronically Signed   By: Misty Stanley M.D.   On: 04/25/2014 17:15   Nm Hepatobiliary Including Gb  04/29/2014   CLINICAL DATA:  Gallstones. Abdominal pain evaluate for cystic duct obstruction.  EXAM: NUCLEAR MEDICINE HEPATOBILIARY IMAGING  TECHNIQUE: Sequential images of the abdomen were obtained out to 60 minutes following intravenous administration of radiopharmaceutical.  RADIOPHARMACEUTICALS:  5.0 Millicurie LP-37T Choletec  COMPARISON:  Ultrasound from 04/28/2014  FINDINGS: Following the intravenous administration of the radiopharmaceutical there is uniform tracer uptake by the liver with clearance from blood pool. Radiotracer activity within the stomach is noted by 12 min.  IMPRESSION: 1. Patent cystic duct without evidence for acute cholecystitis.   Electronically Signed   By: Kerby Moors M.D.   On: 04/29/2014 14:43   US Abdomen Complete  04/28/2014   CLINICAL DATA:  Abdominal  pain  EXAM: ULTRASOUND ABDOMEN COMPLETE  COMPARISON:  Ultrasound abdomen 04/20/2014  FINDINGS: Gallbladder: 13 mm gallstone. Mild tenderness over the gallbladder on examination. Gallbladder wall is not thickened.  Common bile duct: Diameter: 5.2 mm  Liver: No focal lesion identified. Within normal limits in parenchymal echogenicity.  IVC: No abnormality visualized.  Pancreas: Visualized portion unremarkable.  Spleen: Size and appearance within normal limits.  Right Kidney: Length: 9.4 cm. Echogenicity within normal limits. No mass or hydronephrosis visualized.  Left Kidney: Length: 9.6 cm. 7.7 mm upper pole echogenic focus may represent a stone. Echogenicity within  normal limits. No mass or hydronephrosis visualized.  Abdominal aorta: No aneurysm visualized.  Other findings: Right pleural effusion  IMPRESSION: 13 mm gallstone. Mild tenderness over the gallbladder without gallbladder wall thickening.  Possible left upper pole renal calculus.   Electronically Signed   By: Franchot Gallo M.D.   On: 04/28/2014 08:20   US Abdomen Complete  04/21/2014   CLINICAL DATA:  Flank pain  EXAM: ULTRASOUND ABDOMEN COMPLETE  COMPARISON:  None.  FINDINGS: Gallbladder: There is an echogenic mobile gallstone. A small amount of sludge is present. There is no gallbladder wall thickening, pericholecystic fluid, or positive sonographic Murphy's sign.  Common bile duct: Diameter: 5.4 mm  Liver: No focal lesion identified. Within normal limits in parenchymal echogenicity.  IVC: No abnormality visualized.  Pancreas: Bowel gas limited evaluation of the pancreas. Only the midbody could be evaluated.  Spleen: Size and appearance within normal limits.  Right Kidney: Length: 8.9 cm. Echogenicity within normal limits. No mass or hydronephrosis visualized.  Left Kidney: Length: 8.5 cm. Increased echotexture within the renal sinuses. There is mild cortical thinning. There is no hydronephrosis.  Abdominal aorta: No aneurysm visualized.  Other findings: There is a right pleural effusion.  IMPRESSION: 1. There is a mobile gallstone without evidence of acute cholecystitis. 2. There is limited evaluation of the pancreas. 3. Increased density in the renal sinuses on the left may reflect nephrocalcinosis. There is no obstruction. 4. There is an right-sided pleural effusion.   Electronically Signed   By: Edmond  Martinique   On: 04/21/2014 09:11   Dg Chest Port 1 View  04/30/2014   CLINICAL DATA:  68 year old male with anemia and right-sided pleural effusion  EXAM: PORTABLE CHEST - 1 VIEW  COMPARISON:  Prior chest x-ray yesterday 04/29/2014  FINDINGS: Persistent large right layering pleural effusion. No definite  pneumothorax. The left lung remains clear. Unchanged cardiac and mediastinal contours. Dense right basilar opacity favored to reflect pleural effusion and atelectasis.  IMPRESSION: Persistent large layering right pleural effusion and associated right basilar atelectasis.  No pneumothorax identified.   Electronically Signed   By: Jacqulynn Cadet M.D.   On: 04/30/2014 09:35   US Thoracentesis Asp Pleural Space W/img Guide  04/29/2014   INDICATION: Symptomatic right sided pleural effusion  EXAM: US THORACENTESIS ASP PLEURAL SPACE W/IMG GUIDE  COMPARISON:  Previous thoracentesis  MEDICATIONS: 10 cc 1% lidocaine  COMPLICATIONS: None immediate  TECHNIQUE: Informed written consent was obtained from the patient after a discussion of the risks, benefits and alternatives to treatment. A timeout was performed prior to the initiation of the procedure.  Initial ultrasound scanning demonstrates a left loculated pleural effusion. The lower chest was prepped and draped in the usual sterile fashion. 1% lidocaine was used for local anesthesia.  Under direct ultrasound guidance, a 19 gauge, 7-cm, Yueh catheter was introduced. An ultrasound image was saved for documentation purposes. The thoracentesis was performed. The  catheter was removed and a dressing was applied. The patient tolerated the procedure well without immediate post procedural complication. The patient was escorted to have an upright chest radiograph.  FINDINGS: A total of approximately 200 cc of blood tinge fluid was removed. Requested samples were sent to the laboratory.  IMPRESSION: Successful ultrasound-guided left sided thoracentesis yielding 200 cc of pleural fluid.  Starting blood pressure was 87/51 ; blood pressure dropped to 78/52 still prior to procedure. Call patient's MD. Was decided only diagnostic study would be performed.  Ending blood pressure 81/56  Read by:  Lavonia Drafts [facility]   Electronically Signed   By: Daryll Brod M.D.   On: 04/29/2014  12:19   US Thoracentesis Asp Pleural Space W/img Guide  04/25/2014   INDICATION: Symptomatic right sided pleural effusion  EXAM: US THORACENTESIS ASP PLEURAL SPACE W/IMG GUIDE  COMPARISON:  None.  MEDICATIONS: 10 cc 1% lidocaine  COMPLICATIONS: None immediate  TECHNIQUE: Informed written consent was obtained from the patient after a discussion of the risks, benefits and alternatives to treatment. A timeout was performed prior to the initiation of the procedure.  Initial ultrasound scanning demonstrates a right pleural effusion. The lower chest was prepped and draped in the usual sterile fashion. 1% lidocaine was used for local anesthesia.  Under direct ultrasound guidance, a 19 gauge, 7-cm, Yueh catheter was introduced. An ultrasound image was saved for documentation purposes. the thoracentesis was performed. The catheter was removed and a dressing was applied. The patient tolerated the procedure well without immediate post procedural complication. The patient was escorted to have an upright chest radiograph.  FINDINGS: A total of approximately 900 cc of blood-tinged dark fluid was removed. Requested samples were sent to the laboratory.  IMPRESSION: Successful ultrasound-guided right sided thoracentesis yielding 900 cc of pleural fluid.  Inferior part of the effusion was loculated.  Patient's blood pressure lowered during procedure; patient was asymptomatic. Procedure was stopped.  Final blood pressure was 96/61  Read by:  Lavonia Drafts [facility]   Electronically Signed   By: Aletta Edouard M.D.   On: 04/25/2014 15:20    Oren Binet, MD  [facility] Pager:[phone]  If 7PM-7AM, please contact night-coverage [email or web address] Password [email or web address] 05/01/2014, 5:22 PM   LOS: 3 days

## 2014-05-01 NOTE — Op Note (Signed)
[facility] [place], [id number]   ENDOSCOPY PROCEDURE REPORT  PATIENT: [name] [name] [name], [name] [name]  MR#: [id number] BIRTHDATE: 16-Dec-1945 , 68  yrs. old GENDER: male ENDOSCOPIST:Tyreesha Maharaj, MD REFERRED BY: Dr. Nena Jordan, Dr. Daniel Nones PROCEDURE DATE:  05/01/2014 PROCEDURE:   Upper endoscopy with biopsies ASA CLASS:    III INDICATIONS: melena and hematochezia with rise in BUN to 75, and drop in hemoglobin to 7, in a patient with recent significant exposure to Aleve over the past month MEDICATION: Versed 2 mg IV TOPICAL ANESTHETIC:   Cetacaine spray  DESCRIPTION OF PROCEDURE:   After the risks and benefits of the procedure were explained, informed consent was obtained.  The Pentax Gastroscope Peds [id number]  endoscope was introduced through the mouth  and advanced to the second portion of the duodenum . The instrument was slowly withdrawn as the mucosa was fully examined.    the patient was brought from his hospital room to the endoscopy unit at [facility]. Time out was performed, and he received the above sedation, remaining stable throughout the procedure although his O2 saturation did drop slightly to the low 90s toward the end of the procedure, although he was breathing quite well.  I used the Pentax pediatric upper endoscope for this procedure, so that we could use less sedation. This was passed under direct vision. The larynx looked grossly normal and the esophagus was readily entered under direct vision.  Esophagus was normal except for a Schatzki's ring at the squamocolumnar junction area specifically, no infection, neoplasia, varices, Mallory-Weiss tear, Barrett's esophagus, or reflux esophagitis were noted. A 3 cm hiatal hernia was present.  The stomach was entered. It contained no blood or coffee-ground material, just a tiny bit of clear amber bile refluxing from the pylorus. No gastritis, erosions, ulcers,  polyps, or masses were noted in the stomach, and a retroflexed view of the cardia was unremarkable.  The pylorus was normal, but the duodenal bulb was pertinent for a large, 2.5 cm diameter ulcer with a dirty base. There was no blood in the duodenal lumen. Just distal to this, in the proximal second duodenum, was an even larger, somewhat deeper ulcer, measuring approximately 4 x 2.5 cm. This had a stigma of recent hemorrhage with a red color sign, but not really a protuberant visible vessel or adherent clot. I elected not to attempt clipping or other intervention.  However, prior to removal of the scope, I did obtain some antral biopsies to look for evidence of Helicobacter pylori infection.  retroflexion was normal, as mentioned.         The scope was then withdrawn from the patient and the procedure completed.  COMPLICATIONS: There were no immediate complications.  ENDOSCOPIC IMPRESSION: 1. No active bleeding or blood in the stomach at the time of this procedure 2. 2 large duodenal ulcers, as described above. One of them, in particular, had a stigma of recent hemorrhage  RECOMMENDATIONS: 1. Continue IV Protonix for now. Consider switching to oral formulation after his VATS 2. Add sucralfate 3. The patient is at moderate risk for rebleeding in the next 24 hours. I have notified Dr. Prescott Gum of this finding in case he wants to consider postponing tomorrow's scheduled VATS procedure. I will check a CBC and a BMET every 12 hours for the next couple of days 4. Await pathology results; if Helicobacter pylori infection is present, I would probably go ahead and treat it, although it is very likely that  the observed ulcers were a consequence of the patient's exposure to Aleve   _______________________________ eSigned:  Ronald Lobo, MD 05/01/2014 8:22 AM     cc: Dr. Nena Jordan, Dr. Michel Bickers  CPT CODES: ICD CODES:  The ICD and CPT codes recommended by this  software are interpretations from the data that the clinical staff has captured with the software.  The verification of the translation of this report to the ICD and CPT codes and modifiers is the sole responsibility of the health care institution and practicing physician where this report was generated.  [name] will not be held responsible for the validity of the ICD and CPT codes included on this report.  AMA assumes no liability for data contained or not contained herein. CPT is a Designer, television/film set of the [employer].  PATIENT NAME:  [name] [name] [name], [name] [name] MR#: [id number]

## 2014-05-01 NOTE — Op Note (Signed)
[facility] [place], [address]   FLEXIBLE SIGMOIDOSCOPY PROCEDURE REPORT  PATIENT: [name] [name], [name] [name]  MR#: [id number] BIRTHDATE: 03/01/1946 , 89  yrs. old GENDER: male ENDOSCOPIST: Ronald Lobo, MD REFERRED BY: Dr. Nena Jordan, Dr. Michel Bickers PROCEDURE DATE:  05/01/2014 PROCEDURE:   flexible sigmoidoscopy ASA CLASS:   III INDICATIONS:hematochezia, following constipation from Vicodin; rule out stercoral ulcer or hemorrhoidal irritation MEDICATIONS: Versed 2 mg IV (given for upper endoscopy prior to this procedure)  DESCRIPTION OF PROCEDURE:   After the risks benefits and alternatives of the procedure were thoroughly explained, informed consent was obtained.  digital exam showed no evident perianal disease, normal sphincter tone, and an unremarkable prostate gland.      The Pentax video pediatric colonoscope       endoscope was introduced through the anus  and advanced to the sigmoid colon, approximately 30 cm.      , The exam was Without limitations. The quality of the prep was excellent      .  The instrument was then slowly withdrawn as the mucosa was fully examined.       this was a normal examination. No stercoral ulcerations, proctitis, colitis, diverticulosis, vascular ectasia, or significant hemorrhoidal irritation were noted.         retroflexion was normal.         The scope was then withdrawn from the patient and the procedure terminated.  COMPLICATIONS: There were no immediate complications.  ENDOSCOPIC IMPRESSION: normal sigmoidoscopy to 30 cm. The patient's hematochezia was probably rapid transit of blood from his known duodenal ulcers.  RECOMMENDATIONS: treatment for duodenal ulcers. No further lower tract interventions  or evaluation needed.  REPEAT EXAM: not necessary  eSigned:  Ronald Lobo, MD 05/01/2014 8:27 AM   CC: Dr. Nena Jordan, Dr. Michel Bickers

## 2014-05-01 NOTE — Progress Notes (Signed)
Day of Surgery Procedure(s) (LRB): ESOPHAGOGASTRODUODENOSCOPY (EGD) (N/A) FLEXIBLE SIGMOIDOSCOPY (N/A) Subjective: Large loculated right pleural effusion, possible empyema Active upper GI bleeding requiring transfusion  Results of upper endoscopy noted including 2 large ulcers in the duodenal bulb. Patient has large loculated pleural effusion but pulmonary status is stable It would be best to delay his right VATS and decortication of right lower lobe because of his large duodenal ulcer with recent active bleeding. We'll cancel surgery for tomorrow and plan on performing surgery later in the week.   Objective: Vital signs in last 24 hours: Temp:  [97.5 F (36.4 C)-98.6 F (37 C)] 98.1 F (36.7 C) (12/13 1010) Pulse Rate:  [65-116] 108 (12/13 1010) Cardiac Rhythm:  [-] Sinus tachycardia (12/12 2255) Resp:  [17-35] 22 (12/13 1010) BP: (93-132)/(56-79) 109/64 mmHg (12/13 1010) SpO2:  [91 %-99 %] 91 % (12/13 1010) FiO2 (%):  [28 %] 28 % (12/12 2232)  Hemodynamic parameters for last 24 hours:   stable  Intake/Output from previous day: 12/12 0701 - 12/13 0700 In: 2565 [P.O.:1080; I.V.:530; Blood:665; IV Piggyback:50] Out: 2950 [Urine:2950] Intake/Output this shift:      Lab Results:  Recent Labs  04/30/14 0730 05/01/14 0430  WBC 6.3 6.1  HGB 7.0* 9.3*  HCT 20.9* 27.4*  PLT 296 295   BMET:  Recent Labs  04/30/14 0730 05/01/14 0430  NA 144 143  K 4.3 4.0  CL 111 105  CO2 22 24  GLUCOSE 135* 99  BUN 53* 43*  CREATININE 2.66* 2.47*  CALCIUM 8.0* 8.5    PT/INR:  Recent Labs  04/30/14 0550  LABPROT 14.9  INR 1.16   ABG No results found for: PHART, HCO3, TCO2, ACIDBASEDEF, O2SAT CBG (last 3)  No results for input(s): GLUCAP in the last 72 hours.  Assessment/Plan: S/P Procedure(s) (LRB): ESOPHAGOGASTRODUODENOSCOPY (EGD) (N/A) FLEXIBLE SIGMOIDOSCOPY (N/A) Check chest x-ray tomorrow Check daily hemoglobin Right VATS mid-late week during this  hospitalization   LOS: 3 days    VAN TRIGT III,PETER 05/01/2014

## 2014-05-01 NOTE — Interval H&P Note (Signed)
History and Physical Interval Note:  05/01/2014 7:37 AM  [name] [name]  has presented today for surgery, with the diagnosis of Hematochezia and anemia  The various methods of treatment have been discussed with the patient. After consideration of risks, benefits and other options for treatment, the patient has consented to  Procedure(s): ESOPHAGOGASTRODUODENOSCOPY (EGD) (N/A) FLEXIBLE SIGMOIDOSCOPY (N/A) as a surgical intervention .  The patient's history has been reviewed, patient examined, no change in status, stable for surgery.  I have reviewed the patient's chart and labs.  Questions were answered to the patient's satisfaction.     [name]

## 2014-05-01 NOTE — Progress Notes (Signed)
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  Has HIV, well controlled.    Subjective:  Had ongoing melena overnight; EGD this AM with duodenal ulcer x2 PPI + Sucralafate VATS postponed No c/o or needs SCr further improved, good UOP  12/12 0701 - 12/13 0700 In: 2565 [P.O.:1080; I.V.:530; Blood:665; IV Piggyback:50] Out: 2950 [Urine:2950]  Filed Weights   04/28/14 0649  Weight: 55.339 kg (122 lb)    Scheduled Meds: . atazanavir-cobicistat  1 tablet Oral Q breakfast  . buPROPion  150 mg Oral Daily  . cefTRIAXone (ROCEPHIN)  IV  1 g Intravenous Q24H  . cholecalciferol  2,000 Units Oral Daily  . feeding supplement (NEPRO CARB STEADY)  237 mL Oral TID BM  . heparin  5,000 Units Subcutaneous 3 times per day  . lamiVUDine  100 mg Oral Daily  . multivitamin with minerals  1 tablet Oral Daily  . pantoprazole (PROTONIX) IV  40 mg Intravenous Q12H  . rosuvastatin  5 mg Oral Daily  . sodium chloride  3 mL Intravenous Q12H  . sucralfate  1 g Oral TID WC & HS  . zidovudine  300 mg Oral Q12H   Continuous Infusions:   PRN Meds:.sodium chloride, acetaminophen **OR** acetaminophen, HYDROcodone-acetaminophen, levalbuterol, ondansetron **OR** ondansetron (ZOFRAN) IV, polyethylene glycol, sodium chloride, sodium chloride, traMADol, zolpidem    Physical Exam:  Blood pressure 109/64, pulse 108, temperature 98.1 F (36.7 C), temperature source Oral, resp. rate 22, height 5\' 6"  (1.676 m), weight 55.339 kg (122 lb), SpO2 91 %. NAD,  RRR, no rub End exp wheezes in bases bilaterally, no crakcles/rales S/nt/nd, nabs,  No LEE No rashes/lesions Nonfocal, aaox3   A/P 1. AoCKD3; improving 1. BL SCr 1.9-2.2; no outpt nephrologist 2. On Evotaz for HIV, includes cobiscistat which impairs distal tubular secretion of SCr w/o affecting GFR 3. Was w/ N/V at home, taking 2 Aleve/d PTA 4. UA with 1+ proteinuria,  and 7-10 RBC/HPF:  1. ANA neg 2. UP/C 0.47 5. 12/10 Renal US w/o obstruction, symmetric normal sized kidneys 6. Agree this was likely  hemodynamic + Aleve 7. Resolving.  Will arrange outpt nephrology f/u 2. R large pleural effusion + RLL Mass 1. Smoker, clubbing; suspect malignancy 2. CT Surgery following 3. TRH and Pulm managing  4. VATS / resection 12/14 3. UGIB 2/2 Duodenal Ulcer  w/ Anemia 1. EGD and Sigmoidoscopy 04/30/14 2. PPI + Sucralfate 3. Follow Hb 4. N/V, RUQ pain 1. HIDA 12/11 with patent cystic duct 2. RUQ more likely referred from pleural space 5. HIV: well controlled on ARVs, as per #1 6. Hyperkalemia: resolved 1. Did rec kayexalate 12/10 2. Would not repeat unless K >6  Will sign off for now.  Please call with any questions or concerns.  Pt does need follow up with nephrology and I will arrange.    Pearson Grippe MD 05/01/2014, 11:05 AM   Recent Labs Lab 04/29/14 0527 04/30/14 0730 05/01/14 0430  NA 142 144 143  K 4.6 4.3 4.0  CL 106 111 105  CO2 23 22 24   GLUCOSE 102* 135* 99  BUN 67* 53* 43*  CREATININE 2.89* 2.66* 2.47*  CALCIUM 8.2* 8.0* 8.5    Recent Labs Lab 04/28/14 0730  04/29/14 0527  04/30/14 0550 04/30/14 0730 05/01/14 0430  WBC 8.9  < > 5.9  --   --  6.3 6.1  NEUTROABS 6.6  --   --   --   --   --   --  HGB 11.1*  < > 8.3*  < > 9.0* 7.0* 9.3*  HCT 32.8*  < > 24.5*  < > 27.0* 20.9* 27.4*  MCV 123.3*  < > 128.9*  --   --  128.2* 113.2*  PLT 348  < > 313  --   --  296 295  < > = values in this interval not displayed.

## 2014-05-01 NOTE — Progress Notes (Signed)
Patient's endoscopy shows 2 large duodenal ulcers with stigmata of recent hemorrhage.  Please see dictated procedure note for further details.  Patient is at moderate risk of rebleeding for the next 24 hours, based on the endoscopic appearance of these ulcers. I have notified Dr. Prescott Gum and have added sucralfate to the patient's pantoprazole that he is already on.  [name], M.D. [phone]

## 2014-05-02 ENCOUNTER — Encounter (HOSPITAL_COMMUNITY): Payer: Self-pay | Admitting: Gastroenterology

## 2014-05-02 ENCOUNTER — Inpatient Hospital Stay (HOSPITAL_COMMUNITY): Payer: Commercial Managed Care - HMO

## 2014-05-02 ENCOUNTER — Encounter (HOSPITAL_COMMUNITY)
Admission: EM | Disposition: A | Discharge status: Skilled Nursing Facility | Payer: Self-pay | Source: Home / Self Care | Attending: Pulmonary Disease

## 2014-05-02 DIAGNOSIS — K2981 Duodenitis with bleeding: Secondary | ICD-10-CM

## 2014-05-02 LAB — BASIC METABOLIC PANEL
Anion gap: 11 (ref 5–15)
Anion gap: 13 (ref 5–15)
BUN: 33 mg/dL — AB (ref 6–23)
BUN: 34 mg/dL — ABNORMAL HIGH (ref 6–23)
CALCIUM: 8.7 mg/dL (ref 8.4–10.5)
CO2: 26 meq/L (ref 19–32)
CO2: 26 meq/L (ref 19–32)
Calcium: 8.7 mg/dL (ref 8.4–10.5)
Chloride: 104 mEq/L (ref 96–112)
Chloride: 102 mEq/L (ref 96–112)
Creatinine, Ser: 2.06 mg/dL — ABNORMAL HIGH (ref 0.50–1.35)
Creatinine, Ser: 2.06 mg/dL — ABNORMAL HIGH (ref 0.50–1.35)
GFR calc Af Amer: 37 mL/min — ABNORMAL LOW (ref >90)
GFR calc Af Amer: 37 mL/min — ABNORMAL LOW (ref >90)
GFR calc non Af Amer: 32 mL/min — ABNORMAL LOW (ref >90)
GFR calc non Af Amer: 32 mL/min — ABNORMAL LOW (ref >90)
GLUCOSE: 105 mg/dL — AB (ref 70–99)
GLUCOSE: 160 mg/dL — AB (ref 70–99)
Potassium: 4.3 mEq/L (ref 3.7–5.3)
Potassium: 4.3 mEq/L (ref 3.7–5.3)
Sodium: 141 mEq/L (ref 137–147)
Sodium: 141 mEq/L (ref 137–147)

## 2014-05-02 LAB — CBC
HEMATOCRIT: 28.7 % — AB (ref 39.0–52.0)
HEMATOCRIT: 29.1 % — AB (ref 39.0–52.0)
HEMOGLOBIN: 9.8 g/dL — AB (ref 13.0–17.0)
HEMOGLOBIN: 9.8 g/dL — AB (ref 13.0–17.0)
MCH: 38.9 pg — ABNORMAL HIGH (ref 26.0–34.0)
MCH: 39.4 pg — AB (ref 26.0–34.0)
MCHC: 34.1 g/dL (ref 30.0–36.0)
MCHC: 33.7 g/dL (ref 30.0–36.0)
MCV: 113.9 fL — ABNORMAL HIGH (ref 78.0–100.0)
MCV: 116.9 fL — ABNORMAL HIGH (ref 78.0–100.0)
Platelets: 315 10*3/uL (ref 150–400)
Platelets: 350 10*3/uL (ref 150–400)
RBC: 2.52 MIL/uL — ABNORMAL LOW (ref 4.22–5.81)
RBC: 2.49 MIL/uL — ABNORMAL LOW (ref 4.22–5.81)
WBC: 6.8 10*3/uL (ref 4.0–10.5)
WBC: 6.8 10*3/uL (ref 4.0–10.5)

## 2014-05-02 LAB — PROTEIN ELECTROPHORESIS, SERUM
Albumin ELP: 41.3 % — ABNORMAL LOW (ref 55.8–66.1)
Alpha-1-Globulin: 12 % — ABNORMAL HIGH (ref 2.9–4.9)
Alpha-2-Globulin: 19.5 % — ABNORMAL HIGH (ref 7.1–11.8)
Beta 2: 8.1 % — ABNORMAL HIGH (ref 3.2–6.5)
Beta Globulin: 5.9 % (ref 4.7–7.2)
Gamma Globulin: 13.2 % (ref 11.1–18.8)
M-Spike, %: NOT DETECTED g/dL
Total Protein ELP: 5.3 g/dL — ABNORMAL LOW (ref 6.0–8.3)

## 2014-05-02 LAB — BETA 2 MICROGLOBULIN, SERUM: Beta-2 Microglobulin: 10 mg/L — ABNORMAL HIGH (ref <=2.51)

## 2014-05-02 SURGERY — VIDEO ASSISTED THORACOSCOPY
Anesthesia: General | Site: Chest | Laterality: Right

## 2014-05-02 MED ORDER — DARUNAVIR-COBICISTAT 800-150 MG PO TABS
1.0000 | ORAL_TABLET | Freq: Every day | ORAL | Status: DC
  Administered 2014-05-03 – 2014-05-09 (×6): 1 via ORAL
  Filled 2014-05-02 (×10): qty 1

## 2014-05-02 NOTE — Progress Notes (Signed)
GASTROENTEROLOGY PROGRESS NOTE  Problem:   Large duodenal ulcers with stigmata of recent hemorrhage, recent subacute GI bleeding, and posthemorrhagic anemia.  Subjective: Feels better overall. Had a non-melenic stool last night, by his report.  Objective: Hemoglobin is slightly up and BUN continues to drop, consistent with the absence of further bleeding.  Assessment: 1. Quiescent upper GI bleed due to duodenal ulcers with some NSAID exposure. 2. Posthemorrhagic anemia, acute.   Plan: 1. Plan to postpone VATS noted 2. Continue current medical therapy. The patient is not able to have twice-daily PPI because of concerns of drug interactions with one of his HIV medications. Therefore, I have added ranitidine at bedtime to his regimen, as well as sucralfate. 3. Since he is more than 24 hours out from his bleeding, he is at relatively low risk for rebleeding at this time. 4. Await pathology results on his gastric biopsies, to see if Helicobacter pylori infection is present. Treat if positive.  [name], M.D. 05/02/2014 11:42 AM

## 2014-05-02 NOTE — Progress Notes (Signed)
Pharmacy note: anti-retrovirals  Patient is receiving lamivudine, zidovudine and Evotaz (atazanavir/cobicistat).  He will need to be on acid suppression for an upper GI bleed.  Atazanavir requires an acidic environment for absorption.    Discussed with PCP Michel Bickers at [facility] - will change Evotaz to Prezcobix (darunavir/cobicistat) daily.  Heide Guile, PharmD, BCPS Clinical Pharmacist Pager [phone]

## 2014-05-02 NOTE — Progress Notes (Signed)
PATIENT DETAILS Name: [name] [name] Age: 67 y.o. Sex: male Date of Birth: 08/14/1945 Admit Date: 04/28/2014 Admitting Physician Charlynne Cousins, MD GMW:NUUVOZD SHAMLEFFER, IBTEHAL, MD  Subjective: No major complaints- brown stools this am  Assessment/Plan: Principal Problem: Large right pleural effusion with right lower lobe mass: Outpatient thoracocentesis suggestive of exudative effusion, cytology negative for malignancy. Seen by cardiothoracic surgery, plans are for right VATS with drainage of effusion, decortication right lower lobe and biopsies of the pleura later this week.  Active Problems: Upper GI bleed: Occurred post admission-patient had multiple dark and bloody stools. This was accompanied by a significant drop in hemoglobin. GI was consulted, patient underwent EGD/sigmoidoscopy on 12/13-EGD showed 2 large duodenal ulcer with stigmata of recent bleeding. Sigmoidoscopy was essentially negative. Currently on PPI and Carafate. No further bleeding. Hb stable. No further NSAID use-apparently was using Aleve to alleviate RUQ pain.  Acute blood loss anemia: Secondary to above, patient is status post PRBC transfusion. Hb stable, continue to monitor CBC closely.  Right upper quadrant abdominal pain: In retrospect, likely secondary to duodenal ulcer. Abdominal ultrasound shows cholelithiasis without any acute features, HIDA scan is negative. Patient can follow up with general surgery as an outpatient if desired.  Acute on chronic kidney disease: Acute renal failure likely secondary to recent GI bleeding, recent use of nonsteroidal anti-inflammatory medications. Creatinine much better and close to usual baseline. Monitor lites periodically.  Human immunodeficiency virus (HIV) disease: Continue antiretrovirals, last CD4 count on 01/18/14-270.  Protein-calorie malnutrition, severe: Continue supplements  Disposition: Remain inpatient  Antibiotics:  See  below   Anti-infectives    Start     Dose/Rate Route Frequency Ordered Stop   05/01/14 1000  lamiVUDine (EPIVIR) 10 MG/ML solution 100 mg     100 mg Oral Daily 04/30/14 1354     04/30/14 1000  zidovudine (RETROVIR) capsule 300 mg     300 mg Oral Every 12 hours 04/30/14 0922     04/29/14 1800  atazanavir-cobicistat (Evotaz) 300-150 MG per tablet 1 tablet     1 tablet Oral Daily with breakfast 04/29/14 1408     04/29/14 1000  cefTRIAXone (ROCEPHIN) 1 g in dextrose 5 % 50 mL IVPB - Premix     1 g100 mL/hr over 30 Minutes Intravenous Every 24 hours 04/29/14 0856     04/28/14 1615  lamiVUDine (EPIVIR) tablet 150 mg  Status:  Discontinued     150 mg Oral Daily 04/28/14 1454 04/30/14 1354   04/28/14 1600  zidovudine (RETROVIR) tablet 300 mg  Status:  Discontinued     300 mg Oral 2 times daily 04/28/14 1454 04/30/14 6644      DVT Prophylaxis: SCD's given GI bleed  Code Status: Full code  Family Communication None at bedside  Procedures:  EGD/Sigmoidoscopy 12/13  CONSULTS:  GI, nephrology and CTVS  MEDICATIONS: Scheduled Meds: . atazanavir-cobicistat  1 tablet Oral Q breakfast  . buPROPion  150 mg Oral Daily  . cefTRIAXone (ROCEPHIN)  IV  1 g Intravenous Q24H  . cholecalciferol  2,000 Units Oral Daily  . famotidine (PEPCID) IV  40 mg Intravenous Q24H  . feeding supplement (NEPRO CARB STEADY)  237 mL Oral TID BM  . lamiVUDine  100 mg Oral Daily  . multivitamin with minerals  1 tablet Oral Daily  . pantoprazole (PROTONIX) IV  40 mg Intravenous QHS  . rosuvastatin  5 mg Oral Daily  . sodium chloride  3 mL Intravenous Q12H  .  sucralfate  1 g Oral TID WC & HS  . zidovudine  300 mg Oral Q12H   Continuous Infusions:  PRN Meds:.sodium chloride, acetaminophen **OR** acetaminophen, HYDROcodone-acetaminophen, levalbuterol, ondansetron **OR** ondansetron (ZOFRAN) IV, polyethylene glycol, sodium chloride, sodium chloride, traMADol, zolpidem    PHYSICAL EXAM: Vital signs in last 24  hours: Filed Vitals:   05/01/14 1728 05/01/14 2125 05/02/14 0601 05/02/14 1211  BP: 107/59 114/62 101/61 117/65  Pulse: 114 113 116 101  Temp: 98.3 F (36.8 C) 98.2 F (36.8 C) 98.4 F (36.9 C) 98.6 F (37 C)  TempSrc: Oral Oral Oral Oral  Resp: 23 20 20 20   Height:      Weight:  52.6 kg (115 lb 15.4 oz)    SpO2: 98% 97% 93% 95%    Weight change:  Filed Weights   04/28/14 0649 05/01/14 2125  Weight: 55.339 kg (122 lb) 52.6 kg (115 lb 15.4 oz)   Body mass index is 18.73 kg/(m^2).   Gen Exam: Awake and alert with clear speech. Neck: Supple, No JVD.  Chest: B/L Clear.  No rales CVS: S1 S2 Regular, no murmurs.  Abdomen: soft, BS +, non tender, non distended.  Extremities: no edema, lower extremities warm to touch Neurologic: Non Focal.   Skin: No Rash.   Wounds: N/A.    Intake/Output from previous day:  Intake/Output Summary (Last 24 hours) at 05/02/14 1326 Last data filed at 05/02/14 0501  Gross per 24 hour  Intake    600 ml  Output   1350 ml  Net   -750 ml     LAB RESULTS: CBC  Recent Labs Lab 04/28/14 0730  04/28/14 1605 04/29/14 0527  04/30/14 0550 04/30/14 0730 05/01/14 0430 05/01/14 1650 05/02/14 0750  WBC 8.9  --  6.7 5.9  --   --  6.3 6.1 6.2 6.8  HGB 11.1*  < > 9.9* 8.3*  < > 9.0* 7.0* 9.3* 9.4* 9.8*  HCT 32.8*  < > 29.4* 24.5*  < > 27.0* 20.9* 27.4* 27.7* 28.7*  PLT 348  --  345 313  --   --  296 295 330 315  MCV 123.3*  --  125.1* 128.9*  --   --  128.2* 113.2* 111.2* 113.9*  MCH 41.7*  --  42.1* 43.7*  --   --  42.9* 38.4* 37.8* 38.9*  MCHC 33.8  --  33.7 33.9  --   --  33.5 33.9 33.9 34.1  RDW 11.8  --  12.1 12.3  --   --  12.4  --  21.0* NOT CALCULATED  LYMPHSABS 1.5  --   --   --   --   --   --   --   --   --   MONOABS 0.7  --   --   --   --   --   --   --   --   --   EOSABS 0.1  --   --   --   --   --   --   --   --   --   BASOSABS 0.0  --   --   --   --   --   --   --   --   --   < > = values in this interval not  displayed.  Chemistries   Recent Labs Lab 04/29/14 0527 04/30/14 0730 05/01/14 0430 05/01/14 1650 05/02/14 0750  NA 142 144 143 142 141  K 4.6 4.3  4.0 4.5 4.3  CL 106 111 105 103 104  CO2 23 22 24 26 26   GLUCOSE 102* 135* 99 135* 105*  BUN 67* 53* 43* 38* 33*  CREATININE 2.89* 2.66* 2.47* 2.17* 2.06*  CALCIUM 8.2* 8.0* 8.5 8.3* 8.7    CBG: No results for input(s): GLUCAP in the last 168 hours.  GFR Estimated Creatinine Clearance: 26.2 mL/min (by C-G formula based on Cr of 2.06).  Coagulation profile  Recent Labs Lab 04/30/14 0550  INR 1.16    Cardiac Enzymes No results for input(s): CKMB, TROPONINI, MYOGLOBIN in the last 168 hours.  Invalid input(s): CK  Invalid input(s): POCBNP No results for input(s): DDIMER in the last 72 hours. No results for input(s): HGBA1C in the last 72 hours. No results for input(s): CHOL, HDL, LDLCALC, TRIG, CHOLHDL, LDLDIRECT in the last 72 hours. No results for input(s): TSH, T4TOTAL, T3FREE, THYROIDAB in the last 72 hours.  Invalid input(s): FREET3 No results for input(s): VITAMINB12, FOLATE, FERRITIN, TIBC, IRON, RETICCTPCT in the last 72 hours. No results for input(s): LIPASE, AMYLASE in the last 72 hours.  Urine Studies No results for input(s): UHGB, CRYS in the last 72 hours.  Invalid input(s): UACOL, UAPR, USPG, UPH, UTP, UGL, UKET, UBIL, UNIT, UROB, ULEU, UEPI, UWBC, URBC, UBAC, CAST, UCOM, BILUA  MICROBIOLOGY: Recent Results (from the past 240 hour(s))  Body fluid culture     Status: None   Collection Time: 04/25/14 11:57 AM  Result Value Ref Range Status   Specimen Description FLUID RIGHT PLEURAL  Final   Special Requests NONE  Final   Gram Stain   Final    RARE WBC PRESENT, PREDOMINANTLY PMN NO ORGANISMS SEEN Performed at [facility]    Culture   Final    NO GROWTH 3 DAYS Performed at [facility]    Report Status 04/28/2014 FINAL  Final    RADIOLOGY STUDIES/RESULTS: Dg Chest 1  View  04/29/2014   CLINICAL DATA:  Status post right thoracentesis.  EXAM: CHEST - 1 VIEW  COMPARISON:  Two-view chest x-ray 04/28/2014.  CT chest 12/ 7/15.  FINDINGS: The heart size is normal. There is no significant interval change in a loculated right pleural effusion. A small right apical pneumothorax is evident. The left lung is clear. Emphysematous changes are noted.  IMPRESSION: 1. Similar appearance of loculated right pleural effusion. 2. Small right apical pneumothorax. Critical Value/emergent results were called by telephone at the time of interpretation on 04/29/2014 at 10:08 am to Dr. Daryll Brod, who verbally acknowledged these results.   Electronically Signed   By: Lawrence Santiago M.D.   On: 04/29/2014 10:08   Dg Chest 1 View  04/25/2014   CLINICAL DATA:  Post thoracentesis  EXAM: CHEST - 1 VIEW  COMPARISON:  Radiograph 04/20/2014  FINDINGS: Normal cardiac silhouette. There is a large volume right pleural effusion which is not changed significantly compared to prior. No pneumothorax. Left lung is clear.  IMPRESSION: 1. Persistent large right pleural effusion. 2. No evidence of pneumothorax following thoracentesis   Electronically Signed   By: Suzy Bouchard M.D.   On: 04/25/2014 13:57   Dg Chest 2 View  05/02/2014   CLINICAL DATA:  Pleural effusion.  EXAM: CHEST  2 VIEW  COMPARISON:  04/30/2014.  FINDINGS: Mediastinum and hilar structures normal. Heart size and pulmonary vascularity normal. Persistent large right pleural effusion with atelectasis and/or infiltrate right lower lobe. Left lung is clear. No pneumothorax. Stable apical pleural thickening consistent with  scarring. No pneumothorax. No acute osseous abnormality.  IMPRESSION: Persistent large right pleural effusion with atelectasis and/or infiltrate right lower lobe.   Electronically Signed   By: Marcello Moores  Register   On: 05/02/2014 07:40   Dg Chest 2 View  04/28/2014   CLINICAL DATA:  Shortness of breath and cough.  Chest pain.   EXAM: CHEST  2 VIEW  COMPARISON:  Chest x-ray and chest CT dated 04/25/2014 and chest x-ray dated 04/20/2014  FINDINGS: The multiloculated right pleural effusion has slightly increased in size. Heart size and pulmonary vascularity are normal. Emphysematous changes are noted throughout both lungs. No infiltrates or effusions on the left. No acute osseous abnormality.  IMPRESSION: Slight increase in the multiloculated right pleural effusion. Emphysema.   Electronically Signed   By: Rozetta Nunnery M.D.   On: 04/28/2014 08:39   Dg Chest 2 View  04/20/2014   CLINICAL DATA:  Short of breath  EXAM: CHEST  2 VIEW  COMPARISON:  Chest x-ray of 10/18/2008  FINDINGS: There is a moderate size right pleural effusion now present with resultant right lower lobe and right middle lobe partial atelectasis with compression. Pneumonia in the right middle lobe and/or right lower lobe cannot be excluded. Apical pleural thickening is again noted. The left lung is clear. Mediastinal and hilar contours are unchanged and the heart is within normal limits in size. No bony abnormality is seen.  IMPRESSION: Moderate size right pleural effusion with right basilar atelectasis.   Electronically Signed   By: Ivar Drape M.D.   On: 04/20/2014 15:59   Ct Chest Wo Contrast  04/25/2014   CLINICAL DATA:  Subsequent encounter for large right pleural effusion  EXAM: CT CHEST WITHOUT CONTRAST  TECHNIQUE: Multidetector CT imaging of the chest was performed following the standard protocol without IV contrast.  COMPARISON:  Chest x-ray from 04/25/2014.  FINDINGS: Soft tissue / Mediastinum: No axillary lymphadenopathy. 6 mm short axis prevascular lymph node associated with 13 mm short axis subcarinal lymph node. No bulky hilar lymphadenopathy. Heart size is normal. Coronary artery calcification is noted. No pericardial effusion.  Lungs / Pleura: Lung windows show advanced bilateral emphysema. There is marked nodularity along the major fissure of the right  lung. Although difficult to assess without intravenous contrast, there appears to be a 7 x 4.5 cm mass in the right lower lobe. This could represent at least in part a component of right lower lobe collapse. There is an associated large right pleural effusion. Irregular pleural thickening is seen posteriorly and laterally in the right hemi thorax.  Bones: Bone windows reveal no worrisome lytic or sclerotic osseous lesions.  Upper Abdomen:  Unremarkable.  IMPRESSION: Limited study without intravenous contrast, but there appears to be a large right lower lobe mass suggesting neoplasm. 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  Large right-sided pleural effusion.   Electronically Signed   By: Misty Stanley M.D.   On: 04/25/2014 17:15   Nm Hepatobiliary Including Gb  04/29/2014   CLINICAL DATA:  Gallstones. Abdominal pain evaluate for cystic duct obstruction.  EXAM: NUCLEAR MEDICINE HEPATOBILIARY IMAGING  TECHNIQUE: Sequential images of the abdomen were obtained out to 60 minutes following intravenous administration of radiopharmaceutical.  RADIOPHARMACEUTICALS:  5.0 Millicurie QQ-59D Choletec  COMPARISON:  Ultrasound from 04/28/2014  FINDINGS: Following the intravenous administration of the radiopharmaceutical there is uniform tracer uptake by the liver with clearance from blood pool. Radiotracer  activity within the stomach is noted by 12 min.  IMPRESSION: 1. Patent cystic duct without evidence for acute cholecystitis.   Electronically Signed   By: Kerby Moors M.D.   On: 04/29/2014 14:43   US Abdomen Complete  04/28/2014   CLINICAL DATA:  Abdominal pain  EXAM: ULTRASOUND ABDOMEN COMPLETE  COMPARISON:  Ultrasound abdomen 04/20/2014  FINDINGS: Gallbladder: 13 mm gallstone. Mild tenderness over the gallbladder on examination. Gallbladder wall is not thickened.  Common bile duct: Diameter: 5.2 mm  Liver:  No focal lesion identified. Within normal limits in parenchymal echogenicity.  IVC: No abnormality visualized.  Pancreas: Visualized portion unremarkable.  Spleen: Size and appearance within normal limits.  Right Kidney: Length: 9.4 cm. Echogenicity within normal limits. No mass or hydronephrosis visualized.  Left Kidney: Length: 9.6 cm. 7.7 mm upper pole echogenic focus may represent a stone. Echogenicity within normal limits. No mass or hydronephrosis visualized.  Abdominal aorta: No aneurysm visualized.  Other findings: Right pleural effusion  IMPRESSION: 13 mm gallstone. Mild tenderness over the gallbladder without gallbladder wall thickening.  Possible left upper pole renal calculus.   Electronically Signed   By: Franchot Gallo M.D.   On: 04/28/2014 08:20   US Abdomen Complete  04/21/2014   CLINICAL DATA:  Flank pain  EXAM: ULTRASOUND ABDOMEN COMPLETE  COMPARISON:  None.  FINDINGS: Gallbladder: There is an echogenic mobile gallstone. A small amount of sludge is present. There is no gallbladder wall thickening, pericholecystic fluid, or positive sonographic Murphy's sign.  Common bile duct: Diameter: 5.4 mm  Liver: No focal lesion identified. Within normal limits in parenchymal echogenicity.  IVC: No abnormality visualized.  Pancreas: Bowel gas limited evaluation of the pancreas. Only the midbody could be evaluated.  Spleen: Size and appearance within normal limits.  Right Kidney: Length: 8.9 cm. Echogenicity within normal limits. No mass or hydronephrosis visualized.  Left Kidney: Length: 8.5 cm. Increased echotexture within the renal sinuses. There is mild cortical thinning. There is no hydronephrosis.  Abdominal aorta: No aneurysm visualized.  Other findings: There is a right pleural effusion.  IMPRESSION: 1. There is a mobile gallstone without evidence of acute cholecystitis. 2. There is limited evaluation of the pancreas. 3. Increased density in the renal sinuses on the left may reflect nephrocalcinosis.  There is no obstruction. 4. There is an right-sided pleural effusion.   Electronically Signed   By: Deano  Martinique   On: 04/21/2014 09:11   Dg Chest Port 1 View  04/30/2014   CLINICAL DATA:  68 year old male with anemia and right-sided pleural effusion  EXAM: PORTABLE CHEST - 1 VIEW  COMPARISON:  Prior chest x-ray yesterday 04/29/2014  FINDINGS: Persistent large right layering pleural effusion. No definite pneumothorax. The left lung remains clear. Unchanged cardiac and mediastinal contours. Dense right basilar opacity favored to reflect pleural effusion and atelectasis.  IMPRESSION: Persistent large layering right pleural effusion and associated right basilar atelectasis.  No pneumothorax identified.   Electronically Signed   By: Jacqulynn Cadet M.D.   On: 04/30/2014 09:35   US Thoracentesis Asp Pleural Space W/img Guide  04/29/2014   INDICATION: Symptomatic right sided pleural effusion  EXAM: US THORACENTESIS ASP PLEURAL SPACE W/IMG GUIDE  COMPARISON:  Previous thoracentesis  MEDICATIONS: 10 cc 1% lidocaine  COMPLICATIONS: None immediate  TECHNIQUE: Informed written consent was obtained from the patient after a discussion of the risks, benefits and alternatives to treatment. A timeout was performed prior to the initiation of the procedure.  Initial ultrasound scanning demonstrates a  left loculated pleural effusion. The lower chest was prepped and draped in the usual sterile fashion. 1% lidocaine was used for local anesthesia.  Under direct ultrasound guidance, a 19 gauge, 7-cm, Yueh catheter was introduced. An ultrasound image was saved for documentation purposes. The thoracentesis was performed. The catheter was removed and a dressing was applied. The patient tolerated the procedure well without immediate post procedural complication. The patient was escorted to have an upright chest radiograph.  FINDINGS: A total of approximately 200 cc of blood tinge fluid was removed. Requested samples were sent to  the laboratory.  IMPRESSION: Successful ultrasound-guided left sided thoracentesis yielding 200 cc of pleural fluid.  Starting blood pressure was 87/51 ; blood pressure dropped to 78/52 still prior to procedure. Call patient's MD. Was decided only diagnostic study would be performed.  Ending blood pressure 81/56  Read by:  Lavonia Drafts [facility]   Electronically Signed   By: Daryll Brod M.D.   On: 04/29/2014 12:19   US Thoracentesis Asp Pleural Space W/img Guide  04/25/2014   INDICATION: Symptomatic right sided pleural effusion  EXAM: US THORACENTESIS ASP PLEURAL SPACE W/IMG GUIDE  COMPARISON:  None.  MEDICATIONS: 10 cc 1% lidocaine  COMPLICATIONS: None immediate  TECHNIQUE: Informed written consent was obtained from the patient after a discussion of the risks, benefits and alternatives to treatment. A timeout was performed prior to the initiation of the procedure.  Initial ultrasound scanning demonstrates a right pleural effusion. The lower chest was prepped and draped in the usual sterile fashion. 1% lidocaine was used for local anesthesia.  Under direct ultrasound guidance, a 19 gauge, 7-cm, Yueh catheter was introduced. An ultrasound image was saved for documentation purposes. the thoracentesis was performed. The catheter was removed and a dressing was applied. The patient tolerated the procedure well without immediate post procedural complication. The patient was escorted to have an upright chest radiograph.  FINDINGS: A total of approximately 900 cc of blood-tinged dark fluid was removed. Requested samples were sent to the laboratory.  IMPRESSION: Successful ultrasound-guided right sided thoracentesis yielding 900 cc of pleural fluid.  Inferior part of the effusion was loculated.  Patient's blood pressure lowered during procedure; patient was asymptomatic. Procedure was stopped.  Final blood pressure was 96/61  Read by:  Lavonia Drafts [facility]   Electronically Signed   By: Aletta Edouard M.D.   On: 04/25/2014  15:20    Oren Binet, MD  [facility] Pager:[phone]  If 7PM-7AM, please contact night-coverage [email or web address] Password [email or web address] 05/02/2014, 1:26 PM   LOS: 4 days

## 2014-05-03 DIAGNOSIS — E43 Unspecified severe protein-calorie malnutrition: Secondary | ICD-10-CM

## 2014-05-03 LAB — CBC
HEMATOCRIT: 30 % — AB (ref 39.0–52.0)
Hemoglobin: 9.8 g/dL — ABNORMAL LOW (ref 13.0–17.0)
MCH: 32.7 pg (ref 26.0–34.0)
MCHC: 32.7 g/dL (ref 30.0–36.0)
MCV: 114.9 fL — AB (ref 78.0–100.0)
Platelets: 354 10*3/uL (ref 150–400)
RBC: 2.61 MIL/uL — ABNORMAL LOW (ref 4.22–5.81)
WBC: 7.4 10*3/uL (ref 4.0–10.5)

## 2014-05-03 LAB — BASIC METABOLIC PANEL
Anion gap: 14 (ref 5–15)
BUN: 35 mg/dL — AB (ref 6–23)
CHLORIDE: 104 meq/L (ref 96–112)
CO2: 25 mEq/L (ref 19–32)
CREATININE: 2.13 mg/dL — AB (ref 0.50–1.35)
Calcium: 9.2 mg/dL (ref 8.4–10.5)
GFR calc Af Amer: 35 mL/min — ABNORMAL LOW (ref >90)
GFR, EST NON AFRICAN AMERICAN: 31 mL/min — AB (ref >90)
Glucose, Bld: 117 mg/dL — ABNORMAL HIGH (ref 70–99)
Potassium: 4.8 mEq/L (ref 3.7–5.3)
Sodium: 143 mEq/L (ref 137–147)

## 2014-05-03 LAB — UIFE/LIGHT CHAINS/TP QN, 24-HR UR
ALPHA 2 UR: DETECTED — AB
Albumin, U: DETECTED
Alpha 1, Urine: DETECTED — AB
Beta, Urine: DETECTED — AB
Gamma Globulin, Urine: DETECTED — AB
Total Protein, Urine: 58 mg/dL — ABNORMAL HIGH (ref 5–25)

## 2014-05-03 MED ORDER — PANTOPRAZOLE SODIUM 40 MG PO TBEC
40.0000 mg | DELAYED_RELEASE_TABLET | Freq: Every day | ORAL | Status: DC
  Administered 2014-05-03 – 2014-05-04 (×2): 40 mg via ORAL
  Filled 2014-05-03 (×3): qty 1

## 2014-05-03 MED ORDER — FAMOTIDINE 40 MG PO TABS
40.0000 mg | ORAL_TABLET | Freq: Every day | ORAL | Status: DC
  Administered 2014-05-04: 40 mg via ORAL
  Filled 2014-05-03 (×2): qty 1

## 2014-05-03 NOTE — Progress Notes (Signed)
PATIENT DETAILS Name: [name] [name] Age: 68 y.o. Sex: male Date of Birth: Apr 06, 1946 Admit Date: 04/28/2014 Admitting Physician Charlynne Cousins, MD [id number] Kelton Pillar, Mammie Lorenzo, MD  Subjective: Owens Shark stools this am. No other complaints  Assessment/Plan: Principal Problem: Large right pleural effusion with right lower lobe mass: Outpatient thoracocentesis suggestive of exudative effusion, cytology negative for malignancy. Seen by cardiothoracic surgery, plans are for right VATS with drainage of effusion, decortication right lower lobe and biopsies of the pleura later this week.  Active Problems: Upper GI bleed: Occurred post admission-patient had multiple dark and bloody stools. This was accompanied by a significant drop in hemoglobin. GI was consulted, patient underwent EGD/sigmoidoscopy on 12/13-EGD showed 2 large duodenal ulcer with stigmata of recent bleeding. Sigmoidoscopy was essentially negative. Currently on PPI and Carafate. No further bleeding. Hb stable. No further NSAID use-apparently was using Aleve to alleviate RUQ pain.  Acute blood loss anemia: Secondary to above, patient is status post PRBC transfusion. Hb stable, continue to monitor CBC closely.  Right upper quadrant abdominal pain: In retrospect, likely secondary to duodenal ulcer. Abdominal ultrasound shows cholelithiasis without any acute features, HIDA scan is negative. Patient can follow up with general surgery as an outpatient if desired.  Acute on chronic kidney disease: Acute renal failure likely secondary to recent GI bleeding, recent use of nonsteroidal anti-inflammatory medications. Creatinine much better and close to usual baseline. Monitor lites periodically.  Human immunodeficiency virus (HIV) disease: Continue antiretrovirals-appreciate Pharmacy input regarding change in ARV's, last CD4 count on 01/18/14-270.  Protein-calorie malnutrition, severe: Continue supplements  Disposition: Remain  inpatient  Antibiotics:  See below   Anti-infectives    Start     Dose/Rate Route Frequency Ordered Stop   05/03/14 0800  darunavir-cobicistat (PREZCOBIX) 800-150 MG per tablet 1 tablet     1 tablet Oral Daily with breakfast 05/02/14 1404     05/01/14 1000  lamiVUDine (EPIVIR) 10 MG/ML solution 100 mg     100 mg Oral Daily 04/30/14 1354     04/30/14 1000  zidovudine (RETROVIR) capsule 300 mg     300 mg Oral Every 12 hours 04/30/14 0922     04/29/14 1800  atazanavir-cobicistat (Evotaz) 300-150 MG per tablet 1 tablet  Status:  Discontinued     1 tablet Oral Daily with breakfast 04/29/14 1408 05/02/14 1404   04/29/14 1000  cefTRIAXone (ROCEPHIN) 1 g in dextrose 5 % 50 mL IVPB - Premix     1 g100 mL/hr over 30 Minutes Intravenous Every 24 hours 04/29/14 0856     04/28/14 1615  lamiVUDine (EPIVIR) tablet 150 mg  Status:  Discontinued     150 mg Oral Daily 04/28/14 1454 04/30/14 1354   04/28/14 1600  zidovudine (RETROVIR) tablet 300 mg  Status:  Discontinued     300 mg Oral 2 times daily 04/28/14 1454 04/30/14 7169      DVT Prophylaxis: SCD's given GI bleed  Code Status: Full code  Family Communication None at bedside  Procedures:  EGD/Sigmoidoscopy 12/13  CONSULTS:  GI, nephrology and CTVS  MEDICATIONS: Scheduled Meds: . buPROPion  150 mg Oral Daily  . cefTRIAXone (ROCEPHIN)  IV  1 g Intravenous Q24H  . cholecalciferol  2,000 Units Oral Daily  . darunavir-cobicistat  1 tablet Oral Q breakfast  . [START ON 05/04/2014] famotidine  40 mg Oral Daily  . feeding supplement (NEPRO CARB STEADY)  237 mL Oral TID BM  . lamiVUDine  100 mg Oral Daily  .  multivitamin with minerals  1 tablet Oral Daily  . pantoprazole  40 mg Oral Q0600  . rosuvastatin  5 mg Oral Daily  . sodium chloride  3 mL Intravenous Q12H  . sucralfate  1 g Oral TID WC & HS  . zidovudine  300 mg Oral Q12H   Continuous Infusions:  PRN Meds:.sodium chloride, acetaminophen **OR** acetaminophen,  HYDROcodone-acetaminophen, levalbuterol, ondansetron **OR** ondansetron (ZOFRAN) IV, polyethylene glycol, sodium chloride, sodium chloride, traMADol, zolpidem    PHYSICAL EXAM: Vital signs in last 24 hours: Filed Vitals:   05/02/14 1656 05/02/14 2108 05/03/14 0548 05/03/14 0946  BP: 103/60 117/66 91/53 143/63  Pulse: 108 118 106 89  Temp: 98.4 F (36.9 C) 98.2 F (36.8 C) 98 F (36.7 C) 98.1 F (36.7 C)  TempSrc: Oral Oral Oral Oral  Resp: 20 18 18    Height:  5\' 6"  (1.676 m)    Weight:  52.7 kg (116 lb 2.9 oz)    SpO2: 96% 99% 92% 95%    Weight change: 0.1 kg (3.5 oz) Filed Weights   04/28/14 0649 05/01/14 2125 05/02/14 2108  Weight: 55.339 kg (122 lb) 52.6 kg (115 lb 15.4 oz) 52.7 kg (116 lb 2.9 oz)   Body mass index is 18.76 kg/(m^2).   Gen Exam: Awake and alert with clear speech. Neck: Supple, No JVD.  Chest: B/L Clear.  No rales CVS: S1 S2 Regular, no murmurs.  Abdomen: soft, BS +, non tender, non distended.  Extremities: no edema, lower extremities warm to touch Neurologic: Non Focal.   Skin: No Rash.   Wounds: N/A.    Intake/Output from previous day:  Intake/Output Summary (Last 24 hours) at 05/03/14 1211 Last data filed at 05/03/14 0600  Gross per 24 hour  Intake      0 ml  Output    600 ml  Net   -600 ml     LAB RESULTS: CBC  Recent Labs Lab 04/28/14 0730  04/30/14 0730 05/01/14 0430 05/01/14 1650 05/02/14 0750 05/02/14 1818 05/03/14 0820  WBC 8.9  < > 6.3 6.1 6.2 6.8 6.8 7.4  HGB 11.1*  < > 7.0* 9.3* 9.4* 9.8* 9.8* 9.8*  HCT 32.8*  < > 20.9* 27.4* 27.7* 28.7* 29.1* 30.0*  PLT 348  < > 296 295 330 315 350 354  MCV 123.3*  < > 128.2* 113.2* 111.2* 113.9* 116.9* 114.9*  MCH 41.7*  < > 42.9* 38.4* 37.8* 38.9* 39.4* 32.7  MCHC 33.8  < > 33.5 33.9 33.9 34.1 33.7 32.7  RDW 11.8  < > 12.4  --  21.0* NOT CALCULATED NOT CALCULATED NOT CALCULATED  LYMPHSABS 1.5  --   --   --   --   --   --   --   MONOABS 0.7  --   --   --   --   --   --   --     EOSABS 0.1  --   --   --   --   --   --   --   BASOSABS 0.0  --   --   --   --   --   --   --   < > = values in this interval not displayed.  Chemistries   Recent Labs Lab 05/01/14 0430 05/01/14 1650 05/02/14 0750 05/02/14 1818 05/03/14 0820  NA 143 142 141 141 143  K 4.0 4.5 4.3 4.3 4.8  CL 105 103 104 102 104  CO2 24 26 26  26  25  GLUCOSE 99 135* 105* 160* 117*  BUN 43* 38* 33* 34* 35*  CREATININE 2.47* 2.17* 2.06* 2.06* 2.13*  CALCIUM 8.5 8.3* 8.7 8.7 9.2    CBG: No results for input(s): GLUCAP in the last 168 hours.  GFR Estimated Creatinine Clearance: 25.4 mL/min (by C-G formula based on Cr of 2.13).  Coagulation profile  Recent Labs Lab 04/30/14 0550  INR 1.16    Cardiac Enzymes No results for input(s): CKMB, TROPONINI, MYOGLOBIN in the last 168 hours.  Invalid input(s): CK  Invalid input(s): POCBNP No results for input(s): DDIMER in the last 72 hours. No results for input(s): HGBA1C in the last 72 hours. No results for input(s): CHOL, HDL, LDLCALC, TRIG, CHOLHDL, LDLDIRECT in the last 72 hours. No results for input(s): TSH, T4TOTAL, T3FREE, THYROIDAB in the last 72 hours.  Invalid input(s): FREET3 No results for input(s): VITAMINB12, FOLATE, FERRITIN, TIBC, IRON, RETICCTPCT in the last 72 hours. No results for input(s): LIPASE, AMYLASE in the last 72 hours.  Urine Studies No results for input(s): UHGB, CRYS in the last 72 hours.  Invalid input(s): UACOL, UAPR, USPG, UPH, UTP, UGL, UKET, UBIL, UNIT, UROB, ULEU, UEPI, UWBC, URBC, UBAC, CAST, UCOM, BILUA  MICROBIOLOGY: Recent Results (from the past 240 hour(s))  Body fluid culture     Status: None   Collection Time: 04/25/14 11:57 AM  Result Value Ref Range Status   Specimen Description FLUID RIGHT PLEURAL  Final   Special Requests NONE  Final   Gram Stain   Final    RARE WBC PRESENT, PREDOMINANTLY PMN NO ORGANISMS SEEN Performed at [facility]    Culture   Final    NO GROWTH 3  DAYS Performed at [facility]    Report Status 04/28/2014 FINAL  Final    RADIOLOGY STUDIES/RESULTS: Dg Chest 1 View  04/29/2014   CLINICAL DATA:  Status post right thoracentesis.  EXAM: CHEST - 1 VIEW  COMPARISON:  Two-view chest x-ray 04/28/2014.  CT chest 12/ 7/15.  FINDINGS: The heart size is normal. There is no significant interval change in a loculated right pleural effusion. A small right apical pneumothorax is evident. The left lung is clear. Emphysematous changes are noted.  IMPRESSION: 1. Similar appearance of loculated right pleural effusion. 2. Small right apical pneumothorax. Critical Value/emergent results were called by telephone at the time of interpretation on 04/29/2014 at 10:08 am to Dr. Daryll Brod, who verbally acknowledged these results.   Electronically Signed   By: Lawrence Santiago M.D.   On: 04/29/2014 10:08   Dg Chest 1 View  04/25/2014   CLINICAL DATA:  Post thoracentesis  EXAM: CHEST - 1 VIEW  COMPARISON:  Radiograph 04/20/2014  FINDINGS: Normal cardiac silhouette. There is a large volume right pleural effusion which is not changed significantly compared to prior. No pneumothorax. Left lung is clear.  IMPRESSION: 1. Persistent large right pleural effusion. 2. No evidence of pneumothorax following thoracentesis   Electronically Signed   By: Suzy Bouchard M.D.   On: 04/25/2014 13:57   Dg Chest 2 View  05/02/2014   CLINICAL DATA:  Pleural effusion.  EXAM: CHEST  2 VIEW  COMPARISON:  04/30/2014.  FINDINGS: Mediastinum and hilar structures normal. Heart size and pulmonary vascularity normal. Persistent large right pleural effusion with atelectasis and/or infiltrate right lower lobe. Left lung is clear. No pneumothorax. Stable apical pleural thickening consistent with scarring. No pneumothorax. No acute osseous abnormality.  IMPRESSION: Persistent large right pleural effusion with atelectasis  and/or infiltrate right lower lobe.   Electronically Signed   By: Marcello Moores   Register   On: 05/02/2014 07:40   Dg Chest 2 View  04/28/2014   CLINICAL DATA:  Shortness of breath and cough.  Chest pain.  EXAM: CHEST  2 VIEW  COMPARISON:  Chest x-ray and chest CT dated 04/25/2014 and chest x-ray dated 04/20/2014  FINDINGS: The multiloculated right pleural effusion has slightly increased in size. Heart size and pulmonary vascularity are normal. Emphysematous changes are noted throughout both lungs. No infiltrates or effusions on the left. No acute osseous abnormality.  IMPRESSION: Slight increase in the multiloculated right pleural effusion. Emphysema.   Electronically Signed   By: Rozetta Nunnery M.D.   On: 04/28/2014 08:39   Dg Chest 2 View  04/20/2014   CLINICAL DATA:  Short of breath  EXAM: CHEST  2 VIEW  COMPARISON:  Chest x-ray of 10/18/2008  FINDINGS: There is a moderate size right pleural effusion now present with resultant right lower lobe and right middle lobe partial atelectasis with compression. Pneumonia in the right middle lobe and/or right lower lobe cannot be excluded. Apical pleural thickening is again noted. The left lung is clear. Mediastinal and hilar contours are unchanged and the heart is within normal limits in size. No bony abnormality is seen.  IMPRESSION: Moderate size right pleural effusion with right basilar atelectasis.   Electronically Signed   By: Ivar Drape M.D.   On: 04/20/2014 15:59   Ct Chest Wo Contrast  04/25/2014   CLINICAL DATA:  Subsequent encounter for large right pleural effusion  EXAM: CT CHEST WITHOUT CONTRAST  TECHNIQUE: Multidetector CT imaging of the chest was performed following the standard protocol without IV contrast.  COMPARISON:  Chest x-ray from 04/25/2014.  FINDINGS: Soft tissue / Mediastinum: No axillary lymphadenopathy. 6 mm short axis prevascular lymph node associated with 13 mm short axis subcarinal lymph node. No bulky hilar lymphadenopathy. Heart size is normal. Coronary artery calcification is noted. No pericardial effusion.   Lungs / Pleura: Lung windows show advanced bilateral emphysema. There is marked nodularity along the major fissure of the right lung. Although difficult to assess without intravenous contrast, there appears to be a 7 x 4.5 cm mass in the right lower lobe. This could represent at least in part a component of right lower lobe collapse. There is an associated large right pleural effusion. Irregular pleural thickening is seen posteriorly and laterally in the right hemi thorax.  Bones: Bone windows reveal no worrisome lytic or sclerotic osseous lesions.  Upper Abdomen:  Unremarkable.  IMPRESSION: Limited study without intravenous contrast, but there appears to be a large right lower lobe mass suggesting neoplasm. 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  Large right-sided pleural effusion.   Electronically Signed   By: Misty Stanley M.D.   On: 04/25/2014 17:15   Nm Hepatobiliary Including Gb  04/29/2014   CLINICAL DATA:  Gallstones. Abdominal pain evaluate for cystic duct obstruction.  EXAM: NUCLEAR MEDICINE HEPATOBILIARY IMAGING  TECHNIQUE: Sequential images of the abdomen were obtained out to 60 minutes following intravenous administration of radiopharmaceutical.  RADIOPHARMACEUTICALS:  5.0 Millicurie JJ-88C Choletec  COMPARISON:  Ultrasound from 04/28/2014  FINDINGS: Following the intravenous administration of the radiopharmaceutical there is uniform tracer uptake by the liver with clearance from blood pool. Radiotracer activity within the stomach is noted by 12 min.  IMPRESSION: 1. Patent cystic duct without  evidence for acute cholecystitis.   Electronically Signed   By: Kerby Moors M.D.   On: 04/29/2014 14:43   US Abdomen Complete  04/28/2014   CLINICAL DATA:  Abdominal pain  EXAM: ULTRASOUND ABDOMEN COMPLETE  COMPARISON:  Ultrasound abdomen 04/20/2014  FINDINGS: Gallbladder: 13 mm gallstone. Mild  tenderness over the gallbladder on examination. Gallbladder wall is not thickened.  Common bile duct: Diameter: 5.2 mm  Liver: No focal lesion identified. Within normal limits in parenchymal echogenicity.  IVC: No abnormality visualized.  Pancreas: Visualized portion unremarkable.  Spleen: Size and appearance within normal limits.  Right Kidney: Length: 9.4 cm. Echogenicity within normal limits. No mass or hydronephrosis visualized.  Left Kidney: Length: 9.6 cm. 7.7 mm upper pole echogenic focus may represent a stone. Echogenicity within normal limits. No mass or hydronephrosis visualized.  Abdominal aorta: No aneurysm visualized.  Other findings: Right pleural effusion  IMPRESSION: 13 mm gallstone. Mild tenderness over the gallbladder without gallbladder wall thickening.  Possible left upper pole renal calculus.   Electronically Signed   By: Franchot Gallo M.D.   On: 04/28/2014 08:20   US Abdomen Complete  04/21/2014   CLINICAL DATA:  Flank pain  EXAM: ULTRASOUND ABDOMEN COMPLETE  COMPARISON:  None.  FINDINGS: Gallbladder: There is an echogenic mobile gallstone. A small amount of sludge is present. There is no gallbladder wall thickening, pericholecystic fluid, or positive sonographic Murphy's sign.  Common bile duct: Diameter: 5.4 mm  Liver: No focal lesion identified. Within normal limits in parenchymal echogenicity.  IVC: No abnormality visualized.  Pancreas: Bowel gas limited evaluation of the pancreas. Only the midbody could be evaluated.  Spleen: Size and appearance within normal limits.  Right Kidney: Length: 8.9 cm. Echogenicity within normal limits. No mass or hydronephrosis visualized.  Left Kidney: Length: 8.5 cm. Increased echotexture within the renal sinuses. There is mild cortical thinning. There is no hydronephrosis.  Abdominal aorta: No aneurysm visualized.  Other findings: There is a right pleural effusion.  IMPRESSION: 1. There is a mobile gallstone without evidence of acute cholecystitis. 2.  There is limited evaluation of the pancreas. 3. Increased density in the renal sinuses on the left may reflect nephrocalcinosis. There is no obstruction. 4. There is an right-sided pleural effusion.   Electronically Signed   By: Smokey  Martinique   On: 04/21/2014 09:11   Dg Chest Port 1 View  04/30/2014   CLINICAL DATA:  68 year old male with anemia and right-sided pleural effusion  EXAM: PORTABLE CHEST - 1 VIEW  COMPARISON:  Prior chest x-ray yesterday 04/29/2014  FINDINGS: Persistent large right layering pleural effusion. No definite pneumothorax. The left lung remains clear. Unchanged cardiac and mediastinal contours. Dense right basilar opacity favored to reflect pleural effusion and atelectasis.  IMPRESSION: Persistent large layering right pleural effusion and associated right basilar atelectasis.  No pneumothorax identified.   Electronically Signed   By: Jacqulynn Cadet M.D.   On: 04/30/2014 09:35   US Thoracentesis Asp Pleural Space W/img Guide  04/29/2014   INDICATION: Symptomatic right sided pleural effusion  EXAM: US THORACENTESIS ASP PLEURAL SPACE W/IMG GUIDE  COMPARISON:  Previous thoracentesis  MEDICATIONS: 10 cc 1% lidocaine  COMPLICATIONS: None immediate  TECHNIQUE: Informed written consent was obtained from the patient after a discussion of the risks, benefits and alternatives to treatment. A timeout was performed prior to the initiation of the procedure.  Initial ultrasound scanning demonstrates a left loculated pleural effusion. The lower chest was prepped and draped in the usual sterile fashion.  1% lidocaine was used for local anesthesia.  Under direct ultrasound guidance, a 19 gauge, 7-cm, Yueh catheter was introduced. An ultrasound image was saved for documentation purposes. The thoracentesis was performed. The catheter was removed and a dressing was applied. The patient tolerated the procedure well without immediate post procedural complication. The patient was escorted to have an upright  chest radiograph.  FINDINGS: A total of approximately 200 cc of blood tinge fluid was removed. Requested samples were sent to the laboratory.  IMPRESSION: Successful ultrasound-guided left sided thoracentesis yielding 200 cc of pleural fluid.  Starting blood pressure was 87/51 ; blood pressure dropped to 78/52 still prior to procedure. Call patient's MD. Was decided only diagnostic study would be performed.  Ending blood pressure 81/56  Read by:  Lavonia Drafts [facility]   Electronically Signed   By: Daryll Brod M.D.   On: 04/29/2014 12:19   US Thoracentesis Asp Pleural Space W/img Guide  04/25/2014   INDICATION: Symptomatic right sided pleural effusion  EXAM: US THORACENTESIS ASP PLEURAL SPACE W/IMG GUIDE  COMPARISON:  None.  MEDICATIONS: 10 cc 1% lidocaine  COMPLICATIONS: None immediate  TECHNIQUE: Informed written consent was obtained from the patient after a discussion of the risks, benefits and alternatives to treatment. A timeout was performed prior to the initiation of the procedure.  Initial ultrasound scanning demonstrates a right pleural effusion. The lower chest was prepped and draped in the usual sterile fashion. 1% lidocaine was used for local anesthesia.  Under direct ultrasound guidance, a 19 gauge, 7-cm, Yueh catheter was introduced. An ultrasound image was saved for documentation purposes. the thoracentesis was performed. The catheter was removed and a dressing was applied. The patient tolerated the procedure well without immediate post procedural complication. The patient was escorted to have an upright chest radiograph.  FINDINGS: A total of approximately 900 cc of blood-tinged dark fluid was removed. Requested samples were sent to the laboratory.  IMPRESSION: Successful ultrasound-guided right sided thoracentesis yielding 900 cc of pleural fluid.  Inferior part of the effusion was loculated.  Patient's blood pressure lowered during procedure; patient was asymptomatic. Procedure was stopped.   Final blood pressure was 96/61  Read by:  Lavonia Drafts [facility]   Electronically Signed   By: Aletta Edouard M.D.   On: 04/25/2014 15:20    Oren Binet, MD  [facility] Pager:[phone]  If 7PM-7AM, please contact night-coverage [email or web address] Password [email or web address] 05/03/2014, 12:11 PM   LOS: 5 days

## 2014-05-03 NOTE — Care Management Note (Signed)
CARE MANAGEMENT NOTE 05/03/2014  Patient:  [name] [name],[name] [name]   Account Number:  [id number]  Date Initiated:  05/03/2014  Documentation initiated by:  Arles Rumbold  Subjective/Objective Assessment:   CM following for progression and d/c planning.     Action/Plan:   05/03/2014 Met with pt re possible d/c needs, pt lives alone will most likely need HHRN and HHPT upon d/c. GI bleed this weekend, plan VATS procedure in the next few days.   Anticipated DC Date:  05/09/2014   Anticipated DC Plan:  [place]         Choice offered to / List presented to:             Status of service:  In process, will continue to follow Medicare Important Message given?  YES (If response is "NO", the following Medicare IM given date fields will be blank) Date Medicare IM given:  05/02/2014 Medicare IM given by:  Sunshine Mackowski Date Additional Medicare IM given:   Additional Medicare IM given by:    Discharge Disposition:    Per UR Regulation:    If discussed at Long Length of Stay Meetings, dates discussed:    Comments:

## 2014-05-03 NOTE — Progress Notes (Signed)
Hgb stable.  BUN stable.  No evidence of further bleeding.  Gastric biopsies negative for H. Pylori infection.  Will sign off at this time; please call if we can be of further assistance with this patient's care.  Recommend:  1. Pantoprazole 40 mg daily for 2 months. 2. Lifelong NSAID avoidance. If the patient needs to be on aspirin or nonsteroidal anti-inflammatory drugs in the future, I would co-treat with a PPI as prophylaxis against recurrent ulceration and bleeding. 3. Follow-up endoscopy not necessary 4. I would continue sucralfate as long as the patient is in the hospital, but I do not think it needs to be continued following discharge. 5. MIRALAX as needed for control of constipation.   [name], M.D. [phone]

## 2014-05-03 NOTE — Progress Notes (Signed)
UR Completed.  [phone]  

## 2014-05-04 ENCOUNTER — Inpatient Hospital Stay (HOSPITAL_COMMUNITY): Payer: Commercial Managed Care - HMO

## 2014-05-04 LAB — COMPREHENSIVE METABOLIC PANEL
ALT: 27 U/L (ref 0–53)
ALT: 29 U/L (ref 0–53)
ANION GAP: 14 (ref 5–15)
AST: 29 U/L (ref 0–37)
AST: 34 U/L (ref 0–37)
Albumin: 2 g/dL — ABNORMAL LOW (ref 3.5–5.2)
Albumin: 2 g/dL — ABNORMAL LOW (ref 3.5–5.2)
Alkaline Phosphatase: 85 U/L (ref 39–117)
Alkaline Phosphatase: 85 U/L (ref 39–117)
Anion gap: 12 (ref 5–15)
BUN: 33 mg/dL — ABNORMAL HIGH (ref 6–23)
BUN: 35 mg/dL — ABNORMAL HIGH (ref 6–23)
CALCIUM: 8.7 mg/dL (ref 8.4–10.5)
CO2: 26 mEq/L (ref 19–32)
CO2: 27 mEq/L (ref 19–32)
CREATININE: 2.22 mg/dL — AB (ref 0.50–1.35)
Calcium: 8.4 mg/dL (ref 8.4–10.5)
Chloride: 100 mEq/L (ref 96–112)
Chloride: 98 mEq/L (ref 96–112)
Creatinine, Ser: 2.2 mg/dL — ABNORMAL HIGH (ref 0.50–1.35)
GFR calc Af Amer: 34 mL/min — ABNORMAL LOW (ref >90)
GFR calc non Af Amer: 29 mL/min — ABNORMAL LOW (ref >90)
GFR, EST AFRICAN AMERICAN: 34 mL/min — AB (ref >90)
GFR, EST NON AFRICAN AMERICAN: 29 mL/min — AB (ref >90)
GLUCOSE: 109 mg/dL — AB (ref 70–99)
Glucose, Bld: 133 mg/dL — ABNORMAL HIGH (ref 70–99)
Potassium: 4.3 mEq/L (ref 3.7–5.3)
Potassium: 4.3 mEq/L (ref 3.7–5.3)
Sodium: 140 mEq/L (ref 137–147)
Sodium: 137 mEq/L (ref 137–147)
Total Bilirubin: 0.6 mg/dL (ref 0.3–1.2)
Total Bilirubin: 0.6 mg/dL (ref 0.3–1.2)
Total Protein: 6.3 g/dL (ref 6.0–8.3)
Total Protein: 6.2 g/dL (ref 6.0–8.3)

## 2014-05-04 LAB — URINE MICROSCOPIC-ADD ON

## 2014-05-04 LAB — PREPARE RBC (CROSSMATCH)

## 2014-05-04 LAB — CBC
HCT: 28.6 % — ABNORMAL LOW (ref 39.0–52.0)
HCT: 26.7 % — ABNORMAL LOW (ref 39.0–52.0)
HEMOGLOBIN: 9.2 g/dL — AB (ref 13.0–17.0)
Hemoglobin: 8.8 g/dL — ABNORMAL LOW (ref 13.0–17.0)
MCH: 37.1 pg — AB (ref 26.0–34.0)
MCH: 37.8 pg — ABNORMAL HIGH (ref 26.0–34.0)
MCHC: 32.2 g/dL (ref 30.0–36.0)
MCHC: 33 g/dL (ref 30.0–36.0)
MCV: 115.3 fL — AB (ref 78.0–100.0)
MCV: 114.6 fL — ABNORMAL HIGH (ref 78.0–100.0)
Platelets: 362 10*3/uL (ref 150–400)
Platelets: 354 10*3/uL (ref 150–400)
RBC: 2.48 MIL/uL — ABNORMAL LOW (ref 4.22–5.81)
RBC: 2.33 MIL/uL — ABNORMAL LOW (ref 4.22–5.81)
RDW: 21.3 % — ABNORMAL HIGH (ref 11.5–15.5)
RDW: 21.2 % — ABNORMAL HIGH (ref 11.5–15.5)
WBC: 7.2 10*3/uL (ref 4.0–10.5)
WBC: 6.7 10*3/uL (ref 4.0–10.5)

## 2014-05-04 LAB — SURGICAL PCR SCREEN
MRSA, PCR: NEGATIVE
Staphylococcus aureus: NEGATIVE

## 2014-05-04 LAB — BLOOD GAS, ARTERIAL
Acid-Base Excess: 4 mmol/L — ABNORMAL HIGH (ref 0.0–2.0)
Bicarbonate: 27.8 mEq/L — ABNORMAL HIGH (ref 20.0–24.0)
Drawn by: 319961
FIO2: 0.21 %
O2 Saturation: 88.3 %
Patient temperature: 98.6
TCO2: 29 mmol/L (ref 0–100)
pCO2 arterial: 40.3 mmHg (ref 35.0–45.0)
pH, Arterial: 7.453 — ABNORMAL HIGH (ref 7.350–7.450)
pO2, Arterial: 51.8 mmHg — ABNORMAL LOW (ref 80.0–100.0)

## 2014-05-04 LAB — URINALYSIS, ROUTINE W REFLEX MICROSCOPIC
Bilirubin Urine: NEGATIVE
Glucose, UA: NEGATIVE mg/dL
Hgb urine dipstick: NEGATIVE
Ketones, ur: NEGATIVE mg/dL
Nitrite: NEGATIVE
Protein, ur: 30 mg/dL — AB
Specific Gravity, Urine: 1.014 (ref 1.005–1.030)
Urobilinogen, UA: 0.2 mg/dL (ref 0.0–1.0)
pH: 6.5 (ref 5.0–8.0)

## 2014-05-04 LAB — PROTIME-INR
INR: 1.09 (ref 0.00–1.49)
Prothrombin Time: 14.3 seconds (ref 11.6–15.2)

## 2014-05-04 LAB — APTT: aPTT: 43 seconds — ABNORMAL HIGH (ref 24–37)

## 2014-05-04 MED ORDER — PANTOPRAZOLE SODIUM 40 MG PO TBEC
40.0000 mg | DELAYED_RELEASE_TABLET | Freq: Two times a day (BID) | ORAL | Status: DC
  Administered 2014-05-04 – 2014-05-08 (×7): 40 mg via ORAL
  Filled 2014-05-04 (×9): qty 1

## 2014-05-04 MED ORDER — CEFUROXIME SODIUM 1.5 G IJ SOLR
1.5000 g | INTRAMUSCULAR | Status: AC
  Administered 2014-05-05: 1.5 g via INTRAVENOUS
  Filled 2014-05-04 (×2): qty 1.5

## 2014-05-04 NOTE — Progress Notes (Signed)
3 Days Post-Op Procedure(s) (LRB): ESOPHAGOGASTRODUODENOSCOPY (EGD) (N/A) FLEXIBLE SIGMOIDOSCOPY (N/A) Subjective: HB stable  Upper GI bleed resolved Will proceed with R VATS tomorrow  Objective: Vital signs in last 24 hours: Temp:  [97.8 F (36.6 C)-98.4 F (36.9 C)] 98.2 F (36.8 C) (12/16 0529) Pulse Rate:  [89-113] 105 (12/16 0529) Cardiac Rhythm:  [-]  Resp:  [17-20] 18 (12/16 0529) BP: (96-143)/(61-63) 96/63 mmHg (12/16 0529) SpO2:  [92 %-99 %] 99 % (12/16 0529) Weight:  [125 lb 4.8 oz (56.836 kg)] 125 lb 4.8 oz (56.836 kg) (12/15 2205)  Hemodynamic parameters for last 24 hours:  afebrile  Intake/Output from previous day: 12/15 0701 - 12/16 0700 In: 840 [P.O.:840] Out: 1450 [Urine:1450] Intake/Output this shift:      Lab Results:  Recent Labs  05/03/14 0820 05/04/14 0550  WBC 7.4 7.2  HGB 9.8* 9.2*  HCT 30.0* 28.6*  PLT 354 362   BMET:  Recent Labs  05/03/14 0820 05/04/14 0550  NA 143 140  K 4.8 4.3  CL 104 100  CO2 25 26  GLUCOSE 117* 109*  BUN 35* 33*  CREATININE 2.13* 2.22*  CALCIUM 9.2 8.7    PT/INR: No results for input(s): LABPROT, INR in the last 72 hours. ABG No results found for: PHART, HCO3, TCO2, ACIDBASEDEF, O2SAT CBG (last 3)  No results for input(s): GLUCAP in the last 72 hours.  Assessment/Plan: S/P Procedure(s) (LRB): ESOPHAGOGASTRODUODENOSCOPY (EGD) (N/A) FLEXIBLE SIGMOIDOSCOPY (N/A)  R VATS, drainage of effusion and decortication of lung tomorrow noon   LOS: 6 days    VAN TRIGT III,PETER 05/04/2014

## 2014-05-04 NOTE — Progress Notes (Signed)
PATIENT DETAILS Name: [name] [name] Age: 68 y.o. Sex: male Date of Birth: 06/03/1945 Admit Date: 04/28/2014 Admitting Physician Charlynne Cousins, MD DPO:[id number] SHAMLEFFER, IBTEHAL, MD  Subjective:  No other complaints  Assessment/Plan: Principal Problem: Large right pleural effusion with right lower lobe mass: Outpatient thoracocentesis suggestive of exudative effusion, cytology negative for malignancy. Seen by cardiothoracic surgery, plans are for right VATS with drainage of effusion, decortication right lower lobe and biopsies of the pleura 05/05/14  Active Problems: Upper GI bleed: Occurred post admission-patient had multiple dark and bloody stools. This was accompanied by a significant drop in hemoglobin. GI was consulted, patient underwent EGD/sigmoidoscopy on 12/13-EGD showed 2 large duodenal ulcer with stigmata of recent bleeding. Sigmoidoscopy was essentially negative. Currently on PPI and Carafate. No further bleeding. Hb stable. No further NSAID use-apparently was using Aleve to alleviate RUQ pain.Per GI-to continue Protonix 40 mg daily for 2 months.  Acute blood loss anemia: Secondary to above, patient is status post PRBC transfusion. Hb stable, continue to monitor CBC closely.  Right upper quadrant abdominal pain: In retrospect, likely secondary to duodenal ulcer. Abdominal ultrasound shows cholelithiasis without any acute features, HIDA scan is negative. Patient can follow up with general surgery as an outpatient if desired.  Acute on chronic kidney disease: Acute renal failure likely secondary to recent GI bleeding, recent use of nonsteroidal anti-inflammatory medications. Creatinine much better and close to usual baseline. Monitor lites periodically.  Human immunodeficiency virus (HIV) disease: Continue antiretrovirals-appreciate Pharmacy input regarding change in ARV's, last CD4 count on 01/18/14-270.  Protein-calorie malnutrition, severe: Continue  supplements  Disposition: Remain inpatient  Antibiotics:  See below   Anti-infectives    Start     Dose/Rate Route Frequency Ordered Stop   05/05/14 0600  cefUROXime (ZINACEF) 1.5 g in dextrose 5 % 50 mL IVPB     1.5 g100 mL/hr over 30 Minutes Intravenous On call to O.R. 05/04/14 1106 05/06/14 0559   05/03/14 0800  darunavir-cobicistat (PREZCOBIX) 800-150 MG per tablet 1 tablet     1 tablet Oral Daily with breakfast 05/02/14 1404     05/01/14 1000  lamiVUDine (EPIVIR) 10 MG/ML solution 100 mg     100 mg Oral Daily 04/30/14 1354     04/30/14 1000  zidovudine (RETROVIR) capsule 300 mg     300 mg Oral Every 12 hours 04/30/14 0922     04/29/14 1800  atazanavir-cobicistat (Evotaz) 300-150 MG per tablet 1 tablet  Status:  Discontinued     1 tablet Oral Daily with breakfast 04/29/14 1408 05/02/14 1404   04/29/14 1000  cefTRIAXone (ROCEPHIN) 1 g in dextrose 5 % 50 mL IVPB - Premix  Status:  Discontinued     1 g100 mL/hr over 30 Minutes Intravenous Every 24 hours 04/29/14 0856 05/03/14 1214   04/28/14 1615  lamiVUDine (EPIVIR) tablet 150 mg  Status:  Discontinued     150 mg Oral Daily 04/28/14 1454 04/30/14 1354   04/28/14 1600  zidovudine (RETROVIR) tablet 300 mg  Status:  Discontinued     300 mg Oral 2 times daily 04/28/14 1454 04/30/14 1443      DVT Prophylaxis: SCD's given GI bleed  Code Status: Full code  Family Communication None at bedside  Procedures:  EGD/Sigmoidoscopy 12/13  CONSULTS:  GI, nephrology and CTVS  MEDICATIONS: Scheduled Meds: . buPROPion  150 mg Oral Daily  . [START ON 05/05/2014] cefUROXime (ZINACEF)  IV  1.5 g Intravenous On Call to OR  .  cholecalciferol  2,000 Units Oral Daily  . darunavir-cobicistat  1 tablet Oral Q breakfast  . feeding supplement (NEPRO CARB STEADY)  237 mL Oral TID BM  . lamiVUDine  100 mg Oral Daily  . multivitamin with minerals  1 tablet Oral Daily  . pantoprazole  40 mg Oral BID  . rosuvastatin  5 mg Oral Daily  .  sodium chloride  3 mL Intravenous Q12H  . sucralfate  1 g Oral TID WC & HS  . zidovudine  300 mg Oral Q12H   Continuous Infusions:  PRN Meds:.sodium chloride, acetaminophen **OR** acetaminophen, HYDROcodone-acetaminophen, levalbuterol, ondansetron **OR** ondansetron (ZOFRAN) IV, polyethylene glycol, sodium chloride, sodium chloride, traMADol, zolpidem    PHYSICAL EXAM: Vital signs in last 24 hours: Filed Vitals:   05/03/14 1700 05/03/14 2205 05/04/14 0529 05/04/14 0956  BP: 115/61 106/62 96/63 98/61   Pulse:  113 105 106  Temp: 97.8 F (36.6 C) 98.4 F (36.9 C) 98.2 F (36.8 C)   TempSrc: Oral Oral Oral   Resp: 20 17 18 17   Height:      Weight:  56.836 kg (125 lb 4.8 oz)    SpO2:  92% 99% 93%    Weight change: 4.136 kg (9 lb 1.9 oz) Filed Weights   05/01/14 2125 05/02/14 2108 05/03/14 2205  Weight: 52.6 kg (115 lb 15.4 oz) 52.7 kg (116 lb 2.9 oz) 56.836 kg (125 lb 4.8 oz)   Body mass index is 20.23 kg/(m^2).   Gen Exam: Awake and alert with clear speech. Neck: Supple, No JVD.  Chest: B/L Clear.  No rales-decreased air entry on right base CVS: S1 S2 Regular, no murmurs.  Abdomen: soft, BS +, non tender, non distended.  Extremities: no edema, lower extremities warm to touch Neurologic: Non Focal.   Skin: No Rash.   Wounds: N/A.    Intake/Output from previous day:  Intake/Output Summary (Last 24 hours) at 05/04/14 1127 Last data filed at 05/04/14 0900  Gross per 24 hour  Intake    720 ml  Output   1450 ml  Net   -730 ml     LAB RESULTS: CBC  Recent Labs Lab 04/28/14 0730  05/01/14 1650 05/02/14 0750 05/02/14 1818 05/03/14 0820 05/04/14 0550  WBC 8.9  < > 6.2 6.8 6.8 7.4 7.2  HGB 11.1*  < > 9.4* 9.8* 9.8* 9.8* 9.2*  HCT 32.8*  < > 27.7* 28.7* 29.1* 30.0* 28.6*  PLT 348  < > 330 315 350 354 362  MCV 123.3*  < > 111.2* 113.9* 116.9* 114.9* 115.3*  MCH 41.7*  < > 37.8* 38.9* 39.4* 32.7 37.1*  MCHC 33.8  < > 33.9 34.1 33.7 32.7 32.2  RDW 11.8  < > 21.0*  NOT CALCULATED NOT CALCULATED NOT CALCULATED 21.3*  LYMPHSABS 1.5  --   --   --   --   --   --   MONOABS 0.7  --   --   --   --   --   --   EOSABS 0.1  --   --   --   --   --   --   BASOSABS 0.0  --   --   --   --   --   --   < > = values in this interval not displayed.  Chemistries   Recent Labs Lab 05/01/14 1650 05/02/14 0750 05/02/14 1818 05/03/14 0820 05/04/14 0550  NA 142 141 141 143 140  K 4.5 4.3 4.3 4.8 4.3  CL 103 104 102 104 100  CO2 26 26 26 25 26   GLUCOSE 135* 105* 160* 117* 109*  BUN 38* 33* 34* 35* 33*  CREATININE 2.17* 2.06* 2.06* 2.13* 2.22*  CALCIUM 8.3* 8.7 8.7 9.2 8.7    CBG: No results for input(s): GLUCAP in the last 168 hours.  GFR Estimated Creatinine Clearance: 26.3 mL/min (by C-G formula based on Cr of 2.22).  Coagulation profile  Recent Labs Lab 04/30/14 0550  INR 1.16    Cardiac Enzymes No results for input(s): CKMB, TROPONINI, MYOGLOBIN in the last 168 hours.  Invalid input(s): CK  Invalid input(s): POCBNP No results for input(s): DDIMER in the last 72 hours. No results for input(s): HGBA1C in the last 72 hours. No results for input(s): CHOL, HDL, LDLCALC, TRIG, CHOLHDL, LDLDIRECT in the last 72 hours. No results for input(s): TSH, T4TOTAL, T3FREE, THYROIDAB in the last 72 hours.  Invalid input(s): FREET3 No results for input(s): VITAMINB12, FOLATE, FERRITIN, TIBC, IRON, RETICCTPCT in the last 72 hours. No results for input(s): LIPASE, AMYLASE in the last 72 hours.  Urine Studies No results for input(s): UHGB, CRYS in the last 72 hours.  Invalid input(s): UACOL, UAPR, USPG, UPH, UTP, UGL, UKET, UBIL, UNIT, UROB, ULEU, UEPI, UWBC, URBC, UBAC, CAST, UCOM, BILUA  MICROBIOLOGY: Recent Results (from the past 240 hour(s))  Body fluid culture     Status: None   Collection Time: 04/25/14 11:57 AM  Result Value Ref Range Status   Specimen Description FLUID RIGHT PLEURAL  Final   Special Requests NONE  Final   Gram Stain   Final     RARE WBC PRESENT, PREDOMINANTLY PMN NO ORGANISMS SEEN Performed at [facility]    Culture   Final    NO GROWTH 3 DAYS Performed at [facility]    Report Status 04/28/2014 FINAL  Final    RADIOLOGY STUDIES/RESULTS: Dg Chest 1 View  04/29/2014   CLINICAL DATA:  Status post right thoracentesis.  EXAM: CHEST - 1 VIEW  COMPARISON:  Two-view chest x-ray 04/28/2014.  CT chest 12/ 7/15.  FINDINGS: The heart size is normal. There is no significant interval change in a loculated right pleural effusion. A small right apical pneumothorax is evident. The left lung is clear. Emphysematous changes are noted.  IMPRESSION: 1. Similar appearance of loculated right pleural effusion. 2. Small right apical pneumothorax. Critical Value/emergent results were called by telephone at the time of interpretation on 04/29/2014 at 10:08 am to Dr. Daryll Brod, who verbally acknowledged these results.   Electronically Signed   By: Lawrence Santiago M.D.   On: 04/29/2014 10:08   Dg Chest 1 View  04/25/2014   CLINICAL DATA:  Post thoracentesis  EXAM: CHEST - 1 VIEW  COMPARISON:  Radiograph 04/20/2014  FINDINGS: Normal cardiac silhouette. There is a large volume right pleural effusion which is not changed significantly compared to prior. No pneumothorax. Left lung is clear.  IMPRESSION: 1. Persistent large right pleural effusion. 2. No evidence of pneumothorax following thoracentesis   Electronically Signed   By: Suzy Bouchard M.D.   On: 04/25/2014 13:57   Dg Chest 2 View  05/04/2014   CLINICAL DATA:  Shortness of breath  EXAM: CHEST  2 VIEW  COMPARISON:  Chest x-ray of 05/02/2014, CT chest 04/25/2014  FINDINGS: There is little change in the opacity at the right lung base most consistent with a large right pleural effusion and subsequent right basilar atelectasis. Prominent markings within the right upper lung  field remain and could represent pneumonia. Since these prominent markings were not present on recent  CT, lymphangitic spread of carcinoma is felt to be unlikely. On the lateral view, there is a pleural-based opacity posteriorly which most likely represents loculated fluid when compared to the recent CT of the chest. The left lung is clear. Heart size is stable. Apical pleural thickening remains.  IMPRESSION: 1. No significant change in large right pleural effusion, right basilar atelectasis, and pleural based opacity on the lateral view probably representing loculated effusion. 2. Prominent markings in the right upper lobe consistent with atelectasis as well as possible pneumonia, not being present on the CT of 04/25/2014.   Electronically Signed   By: Ivar Drape M.D.   On: 05/04/2014 08:04   Dg Chest 2 View  05/02/2014   CLINICAL DATA:  Pleural effusion.  EXAM: CHEST  2 VIEW  COMPARISON:  04/30/2014.  FINDINGS: Mediastinum and hilar structures normal. Heart size and pulmonary vascularity normal. Persistent large right pleural effusion with atelectasis and/or infiltrate right lower lobe. Left lung is clear. No pneumothorax. Stable apical pleural thickening consistent with scarring. No pneumothorax. No acute osseous abnormality.  IMPRESSION: Persistent large right pleural effusion with atelectasis and/or infiltrate right lower lobe.   Electronically Signed   By: Marcello Moores  Register   On: 05/02/2014 07:40   Dg Chest 2 View  04/28/2014   CLINICAL DATA:  Shortness of breath and cough.  Chest pain.  EXAM: CHEST  2 VIEW  COMPARISON:  Chest x-ray and chest CT dated 04/25/2014 and chest x-ray dated 04/20/2014  FINDINGS: The multiloculated right pleural effusion has slightly increased in size. Heart size and pulmonary vascularity are normal. Emphysematous changes are noted throughout both lungs. No infiltrates or effusions on the left. No acute osseous abnormality.  IMPRESSION: Slight increase in the multiloculated right pleural effusion. Emphysema.   Electronically Signed   By: Rozetta Nunnery M.D.   On: 04/28/2014  08:39   Dg Chest 2 View  04/20/2014   CLINICAL DATA:  Short of breath  EXAM: CHEST  2 VIEW  COMPARISON:  Chest x-ray of 10/18/2008  FINDINGS: There is a moderate size right pleural effusion now present with resultant right lower lobe and right middle lobe partial atelectasis with compression. Pneumonia in the right middle lobe and/or right lower lobe cannot be excluded. Apical pleural thickening is again noted. The left lung is clear. Mediastinal and hilar contours are unchanged and the heart is within normal limits in size. No bony abnormality is seen.  IMPRESSION: Moderate size right pleural effusion with right basilar atelectasis.   Electronically Signed   By: Ivar Drape M.D.   On: 04/20/2014 15:59   Ct Chest Wo Contrast  04/25/2014   CLINICAL DATA:  Subsequent encounter for large right pleural effusion  EXAM: CT CHEST WITHOUT CONTRAST  TECHNIQUE: Multidetector CT imaging of the chest was performed following the standard protocol without IV contrast.  COMPARISON:  Chest x-ray from 04/25/2014.  FINDINGS: Soft tissue / Mediastinum: No axillary lymphadenopathy. 6 mm short axis prevascular lymph node associated with 13 mm short axis subcarinal lymph node. No bulky hilar lymphadenopathy. Heart size is normal. Coronary artery calcification is noted. No pericardial effusion.  Lungs / Pleura: Lung windows show advanced bilateral emphysema. There is marked nodularity along the major fissure of the right lung. Although difficult to assess without intravenous contrast, there appears to be a 7 x 4.5 cm mass in the right lower lobe. This could represent at least  in part a component of right lower lobe collapse. There is an associated large right pleural effusion. Irregular pleural thickening is seen posteriorly and laterally in the right hemi thorax.  Bones: Bone windows reveal no worrisome lytic or sclerotic osseous lesions.  Upper Abdomen:  Unremarkable.  IMPRESSION: Limited study without intravenous contrast, but  there appears to be a large right lower lobe mass suggesting neoplasm. 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  Large right-sided pleural effusion.   Electronically Signed   By: Misty Stanley M.D.   On: 04/25/2014 17:15   Nm Hepatobiliary Including Gb  04/29/2014   CLINICAL DATA:  Gallstones. Abdominal pain evaluate for cystic duct obstruction.  EXAM: NUCLEAR MEDICINE HEPATOBILIARY IMAGING  TECHNIQUE: Sequential images of the abdomen were obtained out to 60 minutes following intravenous administration of radiopharmaceutical.  RADIOPHARMACEUTICALS:  5.0 Millicurie [id number] Choletec  COMPARISON:  Ultrasound from 04/28/2014  FINDINGS: Following the intravenous administration of the radiopharmaceutical there is uniform tracer uptake by the liver with clearance from blood pool. Radiotracer activity within the stomach is noted by 12 min.  IMPRESSION: 1. Patent cystic duct without evidence for acute cholecystitis.   Electronically Signed   By: Kerby Moors M.D.   On: 04/29/2014 14:43   US Abdomen Complete  04/28/2014   CLINICAL DATA:  Abdominal pain  EXAM: ULTRASOUND ABDOMEN COMPLETE  COMPARISON:  Ultrasound abdomen 04/20/2014  FINDINGS: Gallbladder: 13 mm gallstone. Mild tenderness over the gallbladder on examination. Gallbladder wall is not thickened.  Common bile duct: Diameter: 5.2 mm  Liver: No focal lesion identified. Within normal limits in parenchymal echogenicity.  IVC: No abnormality visualized.  Pancreas: Visualized portion unremarkable.  Spleen: Size and appearance within normal limits.  Right Kidney: Length: 9.4 cm. Echogenicity within normal limits. No mass or hydronephrosis visualized.  Left Kidney: Length: 9.6 cm. 7.7 mm upper pole echogenic focus may represent a stone. Echogenicity within normal limits. No mass or hydronephrosis visualized.  Abdominal aorta: No aneurysm visualized.  Other  findings: Right pleural effusion  IMPRESSION: 13 mm gallstone. Mild tenderness over the gallbladder without gallbladder wall thickening.  Possible left upper pole renal calculus.   Electronically Signed   By: Franchot Gallo M.D.   On: 04/28/2014 08:20   US Abdomen Complete  04/21/2014   CLINICAL DATA:  Flank pain  EXAM: ULTRASOUND ABDOMEN COMPLETE  COMPARISON:  None.  FINDINGS: Gallbladder: There is an echogenic mobile gallstone. A small amount of sludge is present. There is no gallbladder wall thickening, pericholecystic fluid, or positive sonographic Murphy's sign.  Common bile duct: Diameter: 5.4 mm  Liver: No focal lesion identified. Within normal limits in parenchymal echogenicity.  IVC: No abnormality visualized.  Pancreas: Bowel gas limited evaluation of the pancreas. Only the midbody could be evaluated.  Spleen: Size and appearance within normal limits.  Right Kidney: Length: 8.9 cm. Echogenicity within normal limits. No mass or hydronephrosis visualized.  Left Kidney: Length: 8.5 cm. Increased echotexture within the renal sinuses. There is mild cortical thinning. There is no hydronephrosis.  Abdominal aorta: No aneurysm visualized.  Other findings: There is a right pleural effusion.  IMPRESSION: 1. There is a mobile gallstone without evidence of acute cholecystitis. 2. There is limited evaluation of the pancreas. 3. Increased density in the renal sinuses on the left may reflect nephrocalcinosis. There is no obstruction. 4. There is an right-sided pleural effusion.   Electronically Signed   By:  Ravon  Martinique   On: 04/21/2014 09:11   Dg Chest Port 1 View  04/30/2014   CLINICAL DATA:  68 year old male with anemia and right-sided pleural effusion  EXAM: PORTABLE CHEST - 1 VIEW  COMPARISON:  Prior chest x-ray yesterday 04/29/2014  FINDINGS: Persistent large right layering pleural effusion. No definite pneumothorax. The left lung remains clear. Unchanged cardiac and mediastinal contours. Dense right  basilar opacity favored to reflect pleural effusion and atelectasis.  IMPRESSION: Persistent large layering right pleural effusion and associated right basilar atelectasis.  No pneumothorax identified.   Electronically Signed   By: Jacqulynn Cadet M.D.   On: 04/30/2014 09:35   US Thoracentesis Asp Pleural Space W/img Guide  04/29/2014   INDICATION: Symptomatic right sided pleural effusion  EXAM: US THORACENTESIS ASP PLEURAL SPACE W/IMG GUIDE  COMPARISON:  Previous thoracentesis  MEDICATIONS: 10 cc 1% lidocaine  COMPLICATIONS: None immediate  TECHNIQUE: Informed written consent was obtained from the patient after a discussion of the risks, benefits and alternatives to treatment. A timeout was performed prior to the initiation of the procedure.  Initial ultrasound scanning demonstrates a left loculated pleural effusion. The lower chest was prepped and draped in the usual sterile fashion. 1% lidocaine was used for local anesthesia.  Under direct ultrasound guidance, a 19 gauge, 7-cm, Yueh catheter was introduced. An ultrasound image was saved for documentation purposes. The thoracentesis was performed. The catheter was removed and a dressing was applied. The patient tolerated the procedure well without immediate post procedural complication. The patient was escorted to have an upright chest radiograph.  FINDINGS: A total of approximately 200 cc of blood tinge fluid was removed. Requested samples were sent to the laboratory.  IMPRESSION: Successful ultrasound-guided left sided thoracentesis yielding 200 cc of pleural fluid.  Starting blood pressure was 87/51 ; blood pressure dropped to 78/52 still prior to procedure. Call patient's MD. Was decided only diagnostic study would be performed.  Ending blood pressure 81/56  Read by:  Lavonia Drafts [facility]   Electronically Signed   By: Daryll Brod M.D.   On: 04/29/2014 12:19   US Thoracentesis Asp Pleural Space W/img Guide  04/25/2014   INDICATION: Symptomatic right  sided pleural effusion  EXAM: US THORACENTESIS ASP PLEURAL SPACE W/IMG GUIDE  COMPARISON:  None.  MEDICATIONS: 10 cc 1% lidocaine  COMPLICATIONS: None immediate  TECHNIQUE: Informed written consent was obtained from the patient after a discussion of the risks, benefits and alternatives to treatment. A timeout was performed prior to the initiation of the procedure.  Initial ultrasound scanning demonstrates a right pleural effusion. The lower chest was prepped and draped in the usual sterile fashion. 1% lidocaine was used for local anesthesia.  Under direct ultrasound guidance, a 19 gauge, 7-cm, Yueh catheter was introduced. An ultrasound image was saved for documentation purposes. the thoracentesis was performed. The catheter was removed and a dressing was applied. The patient tolerated the procedure well without immediate post procedural complication. The patient was escorted to have an upright chest radiograph.  FINDINGS: A total of approximately 900 cc of blood-tinged dark fluid was removed. Requested samples were sent to the laboratory.  IMPRESSION: Successful ultrasound-guided right sided thoracentesis yielding 900 cc of pleural fluid.  Inferior part of the effusion was loculated.  Patient's blood pressure lowered during procedure; patient was asymptomatic. Procedure was stopped.  Final blood pressure was 96/61  Read by:  [facility]av[facility] Drafts [facility]   Electronically Signed   By: Jenness Corner.D.  On: 04/25/2014 15:20    Oren Binet, MD  [facility] Pager:[phone]  If 7PM-7AM, please contact night-coverage [email or web address] Password [email or web address] 05/04/2014, 11:27 AM   LOS: 6 days

## 2014-05-05 ENCOUNTER — Encounter (HOSPITAL_COMMUNITY): Payer: Self-pay | Admitting: Anesthesiology

## 2014-05-05 ENCOUNTER — Inpatient Hospital Stay (HOSPITAL_COMMUNITY): Payer: Commercial Managed Care - HMO | Admitting: Anesthesiology

## 2014-05-05 ENCOUNTER — Inpatient Hospital Stay (HOSPITAL_COMMUNITY): Payer: Commercial Managed Care - HMO

## 2014-05-05 ENCOUNTER — Encounter (HOSPITAL_COMMUNITY)
Admission: EM | Disposition: A | Discharge status: Skilled Nursing Facility | Payer: Self-pay | Source: Home / Self Care | Attending: Pulmonary Disease

## 2014-05-05 HISTORY — PX: VIDEO ASSISTED THORACOSCOPY (VATS)/DECORTICATION: SHX6171

## 2014-05-05 LAB — BASIC METABOLIC PANEL
Anion gap: 13 (ref 5–15)
BUN: 43 mg/dL — ABNORMAL HIGH (ref 6–23)
CALCIUM: 9.1 mg/dL (ref 8.4–10.5)
CO2: 27 mEq/L (ref 19–32)
Chloride: 100 mEq/L (ref 96–112)
Creatinine, Ser: 2.45 mg/dL — ABNORMAL HIGH (ref 0.50–1.35)
GFR calc Af Amer: 30 mL/min — ABNORMAL LOW (ref >90)
GFR, EST NON AFRICAN AMERICAN: 26 mL/min — AB (ref >90)
GLUCOSE: 113 mg/dL — AB (ref 70–99)
Potassium: 4.9 mEq/L (ref 3.7–5.3)
Sodium: 140 mEq/L (ref 137–147)

## 2014-05-05 LAB — CBC
HCT: 27.6 % — ABNORMAL LOW (ref 39.0–52.0)
HEMOGLOBIN: 8.9 g/dL — AB (ref 13.0–17.0)
MCH: 37.6 pg — ABNORMAL HIGH (ref 26.0–34.0)
MCHC: 32.2 g/dL (ref 30.0–36.0)
MCV: 116.5 fL — ABNORMAL HIGH (ref 78.0–100.0)
Platelets: 387 10*3/uL (ref 150–400)
RBC: 2.37 MIL/uL — ABNORMAL LOW (ref 4.22–5.81)
RDW: 20.8 % — ABNORMAL HIGH (ref 11.5–15.5)
WBC: 8.2 10*3/uL (ref 4.0–10.5)

## 2014-05-05 LAB — GLUCOSE, CAPILLARY: Glucose-Capillary: 114 mg/dL — ABNORMAL HIGH (ref 70–99)

## 2014-05-05 SURGERY — VIDEO ASSISTED THORACOSCOPY (VATS)/DECORTICATION
Anesthesia: General | Site: Chest | Laterality: Right

## 2014-05-05 MED ORDER — FENTANYL CITRATE 0.05 MG/ML IJ SOLN
INTRAMUSCULAR | Status: DC | PRN
  Administered 2014-05-05: 100 ug via INTRAVENOUS
  Administered 2014-05-05 (×3): 50 ug via INTRAVENOUS

## 2014-05-05 MED ORDER — FENTANYL CITRATE 0.05 MG/ML IJ SOLN
INTRAMUSCULAR | Status: AC
  Filled 2014-05-05: qty 5

## 2014-05-05 MED ORDER — SODIUM CHLORIDE 0.9 % IJ SOLN: 9.0000 mL | INTRAMUSCULAR | Status: DC | PRN

## 2014-05-05 MED ORDER — TALC 5 G PL SUSR
INTRAPLEURAL | Status: DC | PRN
  Administered 2014-05-05: 5 g via INTRAPLEURAL

## 2014-05-05 MED ORDER — NALOXONE HCL 0.4 MG/ML IJ SOLN
0.4000 mg | INTRAMUSCULAR | Status: DC | PRN
  Filled 2014-05-05: qty 1

## 2014-05-05 MED ORDER — INSULIN ASPART 100 UNIT/ML ~~LOC~~ SOLN
0.0000 [IU] | Freq: Four times a day (QID) | SUBCUTANEOUS | Status: DC
  Administered 2014-05-06 – 2014-05-09 (×7): 2 [IU] via SUBCUTANEOUS

## 2014-05-05 MED ORDER — ROCURONIUM BROMIDE 100 MG/10ML IV SOLN
INTRAVENOUS | Status: DC | PRN
  Administered 2014-05-05: 40 mg via INTRAVENOUS
  Administered 2014-05-05: 10 mg via INTRAVENOUS

## 2014-05-05 MED ORDER — HYDROMORPHONE HCL 1 MG/ML IJ SOLN
INTRAMUSCULAR | Status: AC
  Administered 2014-05-05: 0.5 mg via INTRAVENOUS
  Filled 2014-05-05: qty 2

## 2014-05-05 MED ORDER — ALBUMIN HUMAN 5 % IV SOLN
12.5000 g | Freq: Once | INTRAVENOUS | Status: AC
  Administered 2014-05-05: 12.5 g via INTRAVENOUS

## 2014-05-05 MED ORDER — GLYCOPYRROLATE 0.2 MG/ML IJ SOLN
INTRAMUSCULAR | Status: DC | PRN
  Administered 2014-05-05: 0.6 mg via INTRAVENOUS

## 2014-05-05 MED ORDER — SODIUM CHLORIDE 0.9 % IV SOLN
INTRAVENOUS | Status: DC | PRN
  Administered 2014-05-05: 13:00:00 via INTRAVENOUS

## 2014-05-05 MED ORDER — ALBUMIN HUMAN 5 % IV SOLN
INTRAVENOUS | Status: AC
  Administered 2014-05-05: 12.5 g via INTRAVENOUS
  Filled 2014-05-05: qty 250

## 2014-05-05 MED ORDER — ONDANSETRON HCL 4 MG/2ML IJ SOLN
INTRAMUSCULAR | Status: DC | PRN
  Administered 2014-05-05: 4 mg via INTRAVENOUS

## 2014-05-05 MED ORDER — OXYCODONE HCL 5 MG PO TABS
5.0000 mg | ORAL_TABLET | ORAL | Status: DC | PRN
  Administered 2014-05-05: 10 mg via ORAL
  Administered 2014-05-09: 5 mg via ORAL
  Filled 2014-05-05: qty 2
  Filled 2014-05-05: qty 1

## 2014-05-05 MED ORDER — PROPOFOL 10 MG/ML IV BOLUS
INTRAVENOUS | Status: AC
  Filled 2014-05-05: qty 20

## 2014-05-05 MED ORDER — ACETAMINOPHEN 160 MG/5ML PO SOLN
1000.0000 mg | Freq: Four times a day (QID) | ORAL | Status: DC
  Administered 2014-05-07: 1000 mg via ORAL
  Filled 2014-05-05 (×18): qty 40

## 2014-05-05 MED ORDER — PHENYLEPHRINE HCL 10 MG/ML IJ SOLN
10.0000 mg | INTRAVENOUS | Status: DC | PRN
  Administered 2014-05-05: 100 ug/min via INTRAVENOUS

## 2014-05-05 MED ORDER — HYDROMORPHONE HCL 1 MG/ML IJ SOLN
0.2500 mg | INTRAMUSCULAR | Status: DC | PRN
  Administered 2014-05-05 (×3): 0.5 mg via INTRAVENOUS

## 2014-05-05 MED ORDER — DEXTROSE-NACL 5-0.9 % IV SOLN
INTRAVENOUS | Status: DC
  Administered 2014-05-05 – 2014-05-09 (×5): via INTRAVENOUS

## 2014-05-05 MED ORDER — ONDANSETRON HCL 4 MG/2ML IJ SOLN
4.0000 mg | Freq: Four times a day (QID) | INTRAMUSCULAR | Status: DC | PRN
  Administered 2014-05-08 – 2014-05-09 (×3): 4 mg via INTRAVENOUS
  Filled 2014-05-05 (×5): qty 2

## 2014-05-05 MED ORDER — HEMOSTATIC AGENTS (NO CHARGE) OPTIME
TOPICAL | Status: DC | PRN
  Administered 2014-05-05: 1 via TOPICAL

## 2014-05-05 MED ORDER — POTASSIUM CHLORIDE 10 MEQ/50ML IV SOLN: 10.0000 meq | Freq: Every day | INTRAVENOUS | Status: DC | PRN

## 2014-05-05 MED ORDER — TALC 5 G PL SUSR
INTRAPLEURAL | Status: AC
  Filled 2014-05-05: qty 5

## 2014-05-05 MED ORDER — ACETAMINOPHEN 500 MG PO TABS
1000.0000 mg | ORAL_TABLET | Freq: Four times a day (QID) | ORAL | Status: DC
  Administered 2014-05-06 – 2014-05-08 (×6): 1000 mg via ORAL
  Filled 2014-05-05 (×18): qty 2

## 2014-05-05 MED ORDER — ALBUMIN HUMAN 5 % IV SOLN
INTRAVENOUS | Status: DC | PRN
  Administered 2014-05-05: 14:00:00 via INTRAVENOUS

## 2014-05-05 MED ORDER — SODIUM CHLORIDE 0.9 % IV SOLN: INTRAVENOUS | Status: DC

## 2014-05-05 MED ORDER — PROPOFOL 10 MG/ML IV BOLUS
INTRAVENOUS | Status: DC | PRN
  Administered 2014-05-05: 80 mg via INTRAVENOUS

## 2014-05-05 MED ORDER — SODIUM CHLORIDE 0.9 % IV SOLN
INTRAVENOUS | Status: DC | PRN
  Administered 2014-05-05 (×2): via INTRAVENOUS

## 2014-05-05 MED ORDER — DIPHENHYDRAMINE HCL 50 MG/ML IJ SOLN
12.5000 mg | Freq: Four times a day (QID) | INTRAMUSCULAR | Status: DC | PRN
  Filled 2014-05-05: qty 0.25

## 2014-05-05 MED ORDER — 0.9 % SODIUM CHLORIDE (POUR BTL) OPTIME
TOPICAL | Status: DC | PRN
  Administered 2014-05-05: 2000 mL

## 2014-05-05 MED ORDER — DIPHENHYDRAMINE HCL 12.5 MG/5ML PO ELIX
12.5000 mg | ORAL_SOLUTION | Freq: Four times a day (QID) | ORAL | Status: DC | PRN
  Filled 2014-05-05: qty 5

## 2014-05-05 MED ORDER — GLYCOPYRROLATE 0.2 MG/ML IJ SOLN
INTRAMUSCULAR | Status: AC
  Filled 2014-05-05: qty 3

## 2014-05-05 MED ORDER — EPINEPHRINE HCL 0.1 MG/ML IJ SOSY
PREFILLED_SYRINGE | INTRAMUSCULAR | Status: AC
  Filled 2014-05-05: qty 20

## 2014-05-05 MED ORDER — PHENYLEPHRINE 40 MCG/ML (10ML) SYRINGE FOR IV PUSH (FOR BLOOD PRESSURE SUPPORT)
PREFILLED_SYRINGE | INTRAVENOUS | Status: AC
  Filled 2014-05-05: qty 10

## 2014-05-05 MED ORDER — NEOSTIGMINE METHYLSULFATE 10 MG/10ML IV SOLN
INTRAVENOUS | Status: DC | PRN
  Administered 2014-05-05: 4 mg via INTRAVENOUS

## 2014-05-05 MED ORDER — TRAMADOL HCL 50 MG PO TABS: 50.0000 mg | ORAL_TABLET | Freq: Four times a day (QID) | ORAL | Status: DC | PRN

## 2014-05-05 MED ORDER — LIDOCAINE HCL (CARDIAC) 20 MG/ML IV SOLN
INTRAVENOUS | Status: DC | PRN
  Administered 2014-05-05: 70 mg via INTRAVENOUS

## 2014-05-05 MED ORDER — SODIUM CHLORIDE 0.9 % IV SOLN
INTRAVENOUS | Status: DC
  Administered 2014-05-05: 12:00:00 via INTRAVENOUS

## 2014-05-05 MED ORDER — OXYCODONE HCL 5 MG PO TABS
ORAL_TABLET | ORAL | Status: AC
  Administered 2014-05-05: 10 mg via ORAL
  Filled 2014-05-05: qty 2

## 2014-05-05 MED ORDER — MIDAZOLAM HCL 5 MG/5ML IJ SOLN
INTRAMUSCULAR | Status: DC | PRN
  Administered 2014-05-05 (×2): 1 mg via INTRAVENOUS

## 2014-05-05 MED ORDER — FENTANYL 10 MCG/ML IV SOLN
INTRAVENOUS | Status: DC
Start: 2014-05-05 — End: 2014-05-09
  Administered 2014-05-05: 30 ug via INTRAVENOUS
  Administered 2014-05-05: 17:00:00 via INTRAVENOUS
  Administered 2014-05-06: 30 ug via INTRAVENOUS
  Administered 2014-05-06: 40 ug via INTRAVENOUS
  Administered 2014-05-06 (×2): 20 ug via INTRAVENOUS
  Administered 2014-05-06 (×2): 60 ug via INTRAVENOUS
  Administered 2014-05-07: 13:00:00 via INTRAVENOUS
  Administered 2014-05-07: 20 ug via INTRAVENOUS
  Administered 2014-05-07: 50 ug via INTRAVENOUS
  Administered 2014-05-07: 100 ug via INTRAVENOUS
  Administered 2014-05-08: 70 ug via INTRAVENOUS
  Administered 2014-05-08: 100 ug via INTRAVENOUS
  Administered 2014-05-08: 90 ug via INTRAVENOUS
  Administered 2014-05-08: 10:00:00 via INTRAVENOUS
  Administered 2014-05-09: 60 ug via INTRAVENOUS
  Administered 2014-05-09: 50 ug via INTRAVENOUS
  Administered 2014-05-09: 40 ug via INTRAVENOUS
  Filled 2014-05-05 (×4): qty 50

## 2014-05-05 MED ORDER — ONDANSETRON HCL 4 MG/2ML IJ SOLN: 4.0000 mg | Freq: Four times a day (QID) | INTRAMUSCULAR | Status: DC | PRN

## 2014-05-05 MED ORDER — MIDAZOLAM HCL 2 MG/2ML IJ SOLN
INTRAMUSCULAR | Status: AC
  Filled 2014-05-05: qty 2

## 2014-05-05 MED ORDER — PIPERACILLIN-TAZOBACTAM 3.375 G IVPB
3.3750 g | Freq: Three times a day (TID) | INTRAVENOUS | Status: AC
  Administered 2014-05-05 – 2014-05-08 (×9): 3.375 g via INTRAVENOUS
  Filled 2014-05-05 (×9): qty 50

## 2014-05-05 MED ORDER — ATROPINE SULFATE 0.1 MG/ML IJ SOLN
INTRAMUSCULAR | Status: AC
  Filled 2014-05-05: qty 10

## 2014-05-05 MED ORDER — PHENYLEPHRINE HCL 10 MG/ML IJ SOLN
INTRAMUSCULAR | Status: DC | PRN
  Administered 2014-05-05 (×4): 80 ug via INTRAVENOUS

## 2014-05-05 MED ORDER — PROMETHAZINE HCL 25 MG/ML IJ SOLN: 6.2500 mg | INTRAMUSCULAR | Status: DC | PRN

## 2014-05-05 SURGICAL SUPPLY — 64 items
BAG DECANTER FOR FLEXI CONT (MISCELLANEOUS) IMPLANT
BLADE SURG 11 STRL SS (BLADE) ×3 IMPLANT
CANISTER SUCTION 2500CC (MISCELLANEOUS) ×3 IMPLANT
CATH KIT ON Q 5IN SLV (PAIN MANAGEMENT) IMPLANT
CATH ROBINSON RED A/P 22FR (CATHETERS) IMPLANT
CATH THORACIC 28FR (CATHETERS) IMPLANT
CATH THORACIC 36FR (CATHETERS) IMPLANT
CATH THORACIC 36FR RT ANG (CATHETERS) IMPLANT
CONT SPEC 4OZ CLIKSEAL STRL BL (MISCELLANEOUS) ×12 IMPLANT
COVER SURGICAL LIGHT HANDLE (MISCELLANEOUS) ×6 IMPLANT
DERMABOND ADHESIVE PROPEN (GAUZE/BANDAGES/DRESSINGS) ×2
DERMABOND ADVANCED (GAUZE/BANDAGES/DRESSINGS)
DERMABOND ADVANCED .7 DNX12 (GAUZE/BANDAGES/DRESSINGS) IMPLANT
DERMABOND ADVANCED .7 DNX6 (GAUZE/BANDAGES/DRESSINGS) ×1 IMPLANT
DRAPE LAPAROSCOPIC ABDOMINAL (DRAPES) ×3 IMPLANT
DRAPE WARM FLUID 44X44 (DRAPE) ×3 IMPLANT
ELECT BLADE 4.0 EZ CLEAN MEGAD (MISCELLANEOUS) ×3
ELECT BLADE 6.5 EXT (BLADE) ×3 IMPLANT
ELECT REM PT RETURN 9FT ADLT (ELECTROSURGICAL) ×3
ELECTRODE BLDE 4.0 EZ CLN MEGD (MISCELLANEOUS) ×1 IMPLANT
ELECTRODE REM PT RTRN 9FT ADLT (ELECTROSURGICAL) ×1 IMPLANT
GAUZE SPONGE 4X4 12PLY STRL (GAUZE/BANDAGES/DRESSINGS) ×3 IMPLANT
GLOVE SURG SIGNA 7.5 PF LTX (GLOVE) ×6 IMPLANT
GOWN STRL REUS W/ TWL LRG LVL3 (GOWN DISPOSABLE) ×3 IMPLANT
GOWN STRL REUS W/TWL LRG LVL3 (GOWN DISPOSABLE) ×6
KIT BASIN OR (CUSTOM PROCEDURE TRAY) ×3 IMPLANT
KIT ROOM TURNOVER OR (KITS) ×3 IMPLANT
KIT SUCTION CATH 14FR (SUCTIONS) ×3 IMPLANT
NS IRRIG 1000ML POUR BTL (IV SOLUTION) ×12 IMPLANT
PACK CHEST (CUSTOM PROCEDURE TRAY) ×3 IMPLANT
PAD ARMBOARD 7.5X6 YLW CONV (MISCELLANEOUS) ×6 IMPLANT
SEALANT SURG COSEAL 4ML (VASCULAR PRODUCTS) IMPLANT
SOLUTION ANTI FOG 6CC (MISCELLANEOUS) ×3 IMPLANT
SPONGE GAUZE 4X4 12PLY STER LF (GAUZE/BANDAGES/DRESSINGS) ×3 IMPLANT
SPONGE TONSIL 1.25 RF SGL STRG (GAUZE/BANDAGES/DRESSINGS) ×3 IMPLANT
SUT CHROMIC 3 0 SH 27 (SUTURE) ×6 IMPLANT
SUT ETHILON 3 0 PS 1 (SUTURE) IMPLANT
SUT PROLENE 3 0 SH DA (SUTURE) IMPLANT
SUT PROLENE 4 0 RB 1 (SUTURE)
SUT PROLENE 4-0 RB1 .5 CRCL 36 (SUTURE) IMPLANT
SUT PROLENE 6 0 C 1 30 (SUTURE) IMPLANT
SUT SILK  1 MH (SUTURE) ×6
SUT SILK 1 MH (SUTURE) ×3 IMPLANT
SUT SILK 1 TIES 10X30 (SUTURE) IMPLANT
SUT SILK 2 0SH CR/8 30 (SUTURE) ×3 IMPLANT
SUT SILK 3 0SH CR/8 30 (SUTURE) IMPLANT
SUT VIC AB 1 CTX 18 (SUTURE) ×9 IMPLANT
SUT VIC AB 2 TP1 27 (SUTURE) IMPLANT
SUT VIC AB 2-0 CT2 18 VCP726D (SUTURE) IMPLANT
SUT VIC AB 2-0 CTX 36 (SUTURE) ×3 IMPLANT
SUT VIC AB 3-0 SH 18 (SUTURE) IMPLANT
SUT VIC AB 3-0 X1 27 (SUTURE) ×6 IMPLANT
SUT VICRYL 0 UR6 27IN ABS (SUTURE) IMPLANT
SUT VICRYL 2 TP 1 (SUTURE) ×6 IMPLANT
SWAB COLLECTION DEVICE MRSA (MISCELLANEOUS) IMPLANT
SYSTEM SAHARA CHEST DRAIN ATS (WOUND CARE) ×3 IMPLANT
TAPE CLOTH SURG 4X10 WHT LF (GAUZE/BANDAGES/DRESSINGS) ×3 IMPLANT
TIP APPLICATOR SPRAY EXTEND 16 (VASCULAR PRODUCTS) IMPLANT
TOWEL OR 17X24 6PK STRL BLUE (TOWEL DISPOSABLE) ×3 IMPLANT
TOWEL OR 17X26 10 PK STRL BLUE (TOWEL DISPOSABLE) ×6 IMPLANT
TRAP SPECIMEN MUCOUS 40CC (MISCELLANEOUS) ×6 IMPLANT
TRAY FOLEY CATH 16FRSI W/METER (SET/KITS/TRAYS/PACK) ×3 IMPLANT
TUBE ANAEROBIC SPECIMEN COL (MISCELLANEOUS) ×3 IMPLANT
WATER STERILE IRR 1000ML POUR (IV SOLUTION) ×6 IMPLANT

## 2014-05-05 NOTE — Progress Notes (Signed)
PATIENT DETAILS Name: [name] [name] Age: 68 y.o. Sex: male Date of Birth: 25-Apr-1946 Admit Date: 04/28/2014 Admitting Physician Charlynne Cousins, MD NGE:[id number] SHAMLEFFER, IBTEHAL, MD  Subjective:  No other complaints-continues to have brown stools. Anxious about VATS  Assessment/Plan: Principal Problem: Large right pleural effusion with right lower lobe mass: Outpatient thoracocentesis suggestive of exudative effusion, cytology negative for malignancy. Seen by cardiothoracic surgery, plans are for right VATS with drainage of effusion, decortication right lower lobe and biopsies of the pleura 05/05/14  Active Problems: Upper GI bleed: Occurred post admission-patient had multiple dark and bloody stools. This was accompanied by a significant drop in hemoglobin. GI was consulted, patient underwent EGD/sigmoidoscopy on 12/13-EGD showed 2 large duodenal ulcer with stigmata of recent bleeding. Sigmoidoscopy was essentially negative. Currently on PPI and Carafate. No further bleeding. Hb stable. No further NSAID use-apparently was using Aleve to alleviate RUQ pain.Per GI-to continue Protonix 40 mg daily for 2 months.  Acute blood loss anemia: Secondary to above, patient is status post PRBC transfusion. Hb stable, continue to monitor CBC closely.  Right upper quadrant abdominal pain: In retrospect, likely secondary to duodenal ulcer. Abdominal ultrasound shows cholelithiasis without any acute features, HIDA scan is negative. Patient can follow up with general surgery as an outpatient if desired.  Acute on chronic kidney disease: Acute renal failure likely secondary to recent GI bleeding, recent use of nonsteroidal anti-inflammatory medications. Creatinine much better and close to usual baseline,however seems to be climbing slowly-start IVF and monitor lytes periodically.  Human immunodeficiency virus (HIV) disease: Continue antiretrovirals-appreciate Pharmacy input regarding change in  ARV's, last CD4 count on 01/18/14-270.  Protein-calorie malnutrition, severe: Continue supplements  Disposition: Remain inpatient  Antibiotics:  See below   Anti-infectives    Start     Dose/Rate Route Frequency Ordered Stop   05/05/14 0600  cefUROXime (ZINACEF) 1.5 g in dextrose 5 % 50 mL IVPB     1.5 g100 mL/hr over 30 Minutes Intravenous On call to O.R. 05/04/14 1106 05/06/14 0559   05/03/14 0800  darunavir-cobicistat (PREZCOBIX) 800-150 MG per tablet 1 tablet     1 tablet Oral Daily with breakfast 05/02/14 1404     05/01/14 1000  lamiVUDine (EPIVIR) 10 MG/ML solution 100 mg     100 mg Oral Daily 04/30/14 1354     04/30/14 1000  zidovudine (RETROVIR) capsule 300 mg     300 mg Oral Every 12 hours 04/30/14 0922     04/29/14 1800  atazanavir-cobicistat (Evotaz) 300-150 MG per tablet 1 tablet  Status:  Discontinued     1 tablet Oral Daily with breakfast 04/29/14 1408 05/02/14 1404   04/29/14 1000  cefTRIAXone (ROCEPHIN) 1 g in dextrose 5 % 50 mL IVPB - Premix  Status:  Discontinued     1 g100 mL/hr over 30 Minutes Intravenous Every 24 hours 04/29/14 0856 05/03/14 1214   04/28/14 1615  lamiVUDine (EPIVIR) tablet 150 mg  Status:  Discontinued     150 mg Oral Daily 04/28/14 1454 04/30/14 1354   04/28/14 1600  zidovudine (RETROVIR) tablet 300 mg  Status:  Discontinued     300 mg Oral 2 times daily 04/28/14 1454 04/30/14 2440      DVT Prophylaxis: SCD's given GI bleed  Code Status: Full code  Family Communication None at bedside  Procedures:  EGD/Sigmoidoscopy 12/13  CONSULTS:  GI, nephrology and CTVS  MEDICATIONS: Scheduled Meds: . buPROPion  150 mg Oral Daily  . cefUROXime (  ZINACEF)  IV  1.5 g Intravenous On Call to OR  . cholecalciferol  2,000 Units Oral Daily  . darunavir-cobicistat  1 tablet Oral Q breakfast  . feeding supplement (NEPRO CARB STEADY)  237 mL Oral TID BM  . lamiVUDine  100 mg Oral Daily  . multivitamin with minerals  1 tablet Oral Daily  .  pantoprazole  40 mg Oral BID  . rosuvastatin  5 mg Oral Daily  . sodium chloride  3 mL Intravenous Q12H  . sucralfate  1 g Oral TID WC & HS  . zidovudine  300 mg Oral Q12H   Continuous Infusions:  PRN Meds:.sodium chloride, acetaminophen **OR** acetaminophen, HYDROcodone-acetaminophen, levalbuterol, ondansetron **OR** ondansetron (ZOFRAN) IV, polyethylene glycol, sodium chloride, sodium chloride, traMADol, zolpidem    PHYSICAL EXAM: Vital signs in last 24 hours: Filed Vitals:   05/04/14 1700 05/04/14 2100 05/05/14 0438 05/05/14 0942  BP: 116/70 114/67 95/68 99/63   Pulse: 108 124 106 105  Temp: 98.1 F (36.7 C) 98 F (36.7 C) 98.4 F (36.9 C) 98 F (36.7 C)  TempSrc: Oral Axillary Oral Oral  Resp: 18 19 19 20   Height:      Weight:      SpO2: 93% 94% 99% 98%    Weight change:  Filed Weights   05/01/14 2125 05/02/14 2108 05/03/14 2205  Weight: 52.6 kg (115 lb 15.4 oz) 52.7 kg (116 lb 2.9 oz) 56.836 kg (125 lb 4.8 oz)   Body mass index is 20.23 kg/(m^2).   Gen Exam: Awake and alert with clear speech. Neck: Supple, No JVD.  Chest: B/L Clear.  No rales-decreased air entry on right base CVS: S1 S2 Regular, no murmurs.  Abdomen: soft, BS +, non tender, non distended.  Extremities: no edema, lower extremities warm to touch Neurologic: Non Focal.   Skin: No Rash.   Wounds: N/A.    Intake/Output from previous day:  Intake/Output Summary (Last 24 hours) at 05/05/14 1051 Last data filed at 05/05/14 0942  Gross per 24 hour  Intake    363 ml  Output    800 ml  Net   -437 ml     LAB RESULTS: CBC  Recent Labs Lab 05/02/14 1818 05/03/14 0820 05/04/14 0550 05/04/14 1140 05/05/14 0550  WBC 6.8 7.4 7.2 6.7 8.2  HGB 9.8* 9.8* 9.2* 8.8* 8.9*  HCT 29.1* 30.0* 28.6* 26.7* 27.6*  PLT 350 354 362 354 387  MCV 116.9* 114.9* 115.3* 114.6* 116.5*  MCH 39.4* 32.7 37.1* 37.8* 37.6*  MCHC 33.7 32.7 32.2 33.0 32.2  RDW NOT CALCULATED NOT CALCULATED 21.3* 21.2* 20.8*     Chemistries   Recent Labs Lab 05/02/14 1818 05/03/14 0820 05/04/14 0550 05/04/14 1140 05/05/14 0550  NA 141 143 140 137 140  K 4.3 4.8 4.3 4.3 4.9  CL 102 104 100 98 100  CO2 26 25 26 27 27   GLUCOSE 160* 117* 109* 133* 113*  BUN 34* 35* 33* 35* 43*  CREATININE 2.06* 2.13* 2.22* 2.20* 2.45*  CALCIUM 8.7 9.2 8.7 8.4 9.1    CBG: No results for input(s): GLUCAP in the last 168 hours.  GFR Estimated Creatinine Clearance: 23.8 mL/min (by C-G formula based on Cr of 2.45).  Coagulation profile  Recent Labs Lab 04/30/14 0550 05/04/14 1140  INR 1.16 1.09    Cardiac Enzymes No results for input(s): CKMB, TROPONINI, MYOGLOBIN in the last 168 hours.  Invalid input(s): CK  Invalid input(s): POCBNP No results for input(s): DDIMER in the last 72 hours.  No results for input(s): HGBA1C in the last 72 hours. No results for input(s): CHOL, HDL, LDLCALC, TRIG, CHOLHDL, LDLDIRECT in the last 72 hours. No results for input(s): TSH, T4TOTAL, T3FREE, THYROIDAB in the last 72 hours.  Invalid input(s): FREET3 No results for input(s): VITAMINB12, FOLATE, FERRITIN, TIBC, IRON, RETICCTPCT in the last 72 hours. No results for input(s): LIPASE, AMYLASE in the last 72 hours.  Urine Studies No results for input(s): UHGB, CRYS in the last 72 hours.  Invalid input(s): UACOL, UAPR, USPG, UPH, UTP, UGL, UKET, UBIL, UNIT, UROB, ULEU, UEPI, UWBC, URBC, UBAC, CAST, UCOM, BILUA  MICROBIOLOGY: Recent Results (from the past 240 hour(s))  Body fluid culture     Status: None   Collection Time: 04/25/14 11:57 AM  Result Value Ref Range Status   Specimen Description FLUID RIGHT PLEURAL  Final   Special Requests NONE  Final   Gram Stain   Final    RARE WBC PRESENT, PREDOMINANTLY PMN NO ORGANISMS SEEN Performed at [facility]    Culture   Final    NO GROWTH 3 DAYS Performed at [facility]    Report Status 04/28/2014 FINAL  Final  Surgical pcr screen     Status: None    Collection Time: 05/04/14  9:12 PM  Result Value Ref Range Status   MRSA, PCR NEGATIVE NEGATIVE Final   Staphylococcus aureus NEGATIVE NEGATIVE Final    Comment:        The Xpert SA Assay (FDA approved for NASAL specimens in patients over 54 years of age), is one component of a comprehensive surveillance program.  Test performance has been validated by [employer] for patients greater than or equal to 91 year old. It is not intended to diagnose infection nor to guide or monitor treatment.     RADIOLOGY STUDIES/RESULTS: Dg Chest 1 View  04/29/2014   CLINICAL DATA:  Status post right thoracentesis.  EXAM: CHEST - 1 VIEW  COMPARISON:  Two-view chest x-ray 04/28/2014.  CT chest 12/ 7/15.  FINDINGS: The heart size is normal. There is no significant interval change in a loculated right pleural effusion. A small right apical pneumothorax is evident. The left lung is clear. Emphysematous changes are noted.  IMPRESSION: 1. Similar appearance of loculated right pleural effusion. 2. Small right apical pneumothorax. Critical Value/emergent results were called by telephone at the time of interpretation on 04/29/2014 at 10:08 am to Dr. Daryll Brod, who verbally acknowledged these results.   Electronically Signed   By: Lawrence Santiago M.D.   On: 04/29/2014 10:08   Dg Chest 1 View  04/25/2014   CLINICAL DATA:  Post thoracentesis  EXAM: CHEST - 1 VIEW  COMPARISON:  Radiograph 04/20/2014  FINDINGS: Normal cardiac silhouette. There is a large volume right pleural effusion which is not changed significantly compared to prior. No pneumothorax. Left lung is clear.  IMPRESSION: 1. Persistent large right pleural effusion. 2. No evidence of pneumothorax following thoracentesis   Electronically Signed   By: Suzy Bouchard M.D.   On: 04/25/2014 13:57   Dg Chest 2 View  05/04/2014   CLINICAL DATA:  Shortness of breath  EXAM: CHEST  2 VIEW  COMPARISON:  Chest x-ray of 05/02/2014, CT chest 04/25/2014  FINDINGS:  There is little change in the opacity at the right lung base most consistent with a large right pleural effusion and subsequent right basilar atelectasis. Prominent markings within the right upper lung field remain and could represent pneumonia. Since these prominent markings  were not present on recent CT, lymphangitic spread of carcinoma is felt to be unlikely. On the lateral view, there is a pleural-based opacity posteriorly which most likely represents loculated fluid when compared to the recent CT of the chest. The left lung is clear. Heart size is stable. Apical pleural thickening remains.  IMPRESSION: 1. No significant change in large right pleural effusion, right basilar atelectasis, and pleural based opacity on the lateral view probably representing loculated effusion. 2. Prominent markings in the right upper lobe consistent with atelectasis as well as possible pneumonia, not being present on the CT of 04/25/2014.   Electronically Signed   By: Ivar Drape M.D.   On: 05/04/2014 08:04   Dg Chest 2 View  05/02/2014   CLINICAL DATA:  Pleural effusion.  EXAM: CHEST  2 VIEW  COMPARISON:  04/30/2014.  FINDINGS: Mediastinum and hilar structures normal. Heart size and pulmonary vascularity normal. Persistent large right pleural effusion with atelectasis and/or infiltrate right lower lobe. Left lung is clear. No pneumothorax. Stable apical pleural thickening consistent with scarring. No pneumothorax. No acute osseous abnormality.  IMPRESSION: Persistent large right pleural effusion with atelectasis and/or infiltrate right lower lobe.   Electronically Signed   By: Marcello Moores  Register   On: 05/02/2014 07:40   Dg Chest 2 View  04/28/2014   CLINICAL DATA:  Shortness of breath and cough.  Chest pain.  EXAM: CHEST  2 VIEW  COMPARISON:  Chest x-ray and chest CT dated 04/25/2014 and chest x-ray dated 04/20/2014  FINDINGS: The multiloculated right pleural effusion has slightly increased in size. Heart size and pulmonary  vascularity are normal. Emphysematous changes are noted throughout both lungs. No infiltrates or effusions on the left. No acute osseous abnormality.  IMPRESSION: Slight increase in the multiloculated right pleural effusion. Emphysema.   Electronically Signed   By: Rozetta Nunnery M.D.   On: 04/28/2014 08:39   Dg Chest 2 View  04/20/2014   CLINICAL DATA:  Short of breath  EXAM: CHEST  2 VIEW  COMPARISON:  Chest x-ray of 10/18/2008  FINDINGS: There is a moderate size right pleural effusion now present with resultant right lower lobe and right middle lobe partial atelectasis with compression. Pneumonia in the right middle lobe and/or right lower lobe cannot be excluded. Apical pleural thickening is again noted. The left lung is clear. Mediastinal and hilar contours are unchanged and the heart is within normal limits in size. No bony abnormality is seen.  IMPRESSION: Moderate size right pleural effusion with right basilar atelectasis.   Electronically Signed   By: Ivar Drape M.D.   On: 04/20/2014 15:59   Ct Chest Wo Contrast  04/25/2014   CLINICAL DATA:  Subsequent encounter for large right pleural effusion  EXAM: CT CHEST WITHOUT CONTRAST  TECHNIQUE: Multidetector CT imaging of the chest was performed following the standard protocol without IV contrast.  COMPARISON:  Chest x-ray from 04/25/2014.  FINDINGS: Soft tissue / Mediastinum: No axillary lymphadenopathy. 6 mm short axis prevascular lymph node associated with 13 mm short axis subcarinal lymph node. No bulky hilar lymphadenopathy. Heart size is normal. Coronary artery calcification is noted. No pericardial effusion.  Lungs / Pleura: Lung windows show advanced bilateral emphysema. There is marked nodularity along the major fissure of the right lung. Although difficult to assess without intravenous contrast, there appears to be a 7 x 4.5 cm mass in the right lower lobe. This could represent at least in part a component of right lower lobe collapse. There is  an  associated large right pleural effusion. Irregular pleural thickening is seen posteriorly and laterally in the right hemi thorax.  Bones: Bone windows reveal no worrisome lytic or sclerotic osseous lesions.  Upper Abdomen:  Unremarkable.  IMPRESSION: Limited study without intravenous contrast, but there appears to be a large right lower lobe mass suggesting neoplasm. 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  Large right-sided pleural effusion.   Electronically Signed   By: Misty Stanley M.D.   On: 04/25/2014 17:15   Nm Hepatobiliary Including Gb  04/29/2014   CLINICAL DATA:  Gallstones. Abdominal pain evaluate for cystic duct obstruction.  EXAM: NUCLEAR MEDICINE HEPATOBILIARY IMAGING  TECHNIQUE: Sequential images of the abdomen were obtained out to 60 minutes following intravenous administration of radiopharmaceutical.  RADIOPHARMACEUTICALS:  5.0 Millicurie CV-89F Choletec  COMPARISON:  Ultrasound from 04/28/2014  FINDINGS: Following the intravenous administration of the radiopharmaceutical there is uniform tracer uptake by the liver with clearance from blood pool. Radiotracer activity within the stomach is noted by 12 min.  IMPRESSION: 1. Patent cystic duct without evidence for acute cholecystitis.   Electronically Signed   By: Kerby Moors M.D.   On: 04/29/2014 14:43   US Abdomen Complete  04/28/2014   CLINICAL DATA:  Abdominal pain  EXAM: ULTRASOUND ABDOMEN COMPLETE  COMPARISON:  Ultrasound abdomen 04/20/2014  FINDINGS: Gallbladder: 13 mm gallstone. Mild tenderness over the gallbladder on examination. Gallbladder wall is not thickened.  Common bile duct: Diameter: 5.2 mm  Liver: No focal lesion identified. Within normal limits in parenchymal echogenicity.  IVC: No abnormality visualized.  Pancreas: Visualized portion unremarkable.  Spleen: Size and appearance within normal limits.  Right Kidney:  Length: 9.4 cm. Echogenicity within normal limits. No mass or hydronephrosis visualized.  Left Kidney: Length: 9.6 cm. 7.7 mm upper pole echogenic focus may represent a stone. Echogenicity within normal limits. No mass or hydronephrosis visualized.  Abdominal aorta: No aneurysm visualized.  Other findings: Right pleural effusion  IMPRESSION: 13 mm gallstone. Mild tenderness over the gallbladder without gallbladder wall thickening.  Possible left upper pole renal calculus.   Electronically Signed   By: Franchot Gallo M.D.   On: 04/28/2014 08:20   US Abdomen Complete  04/21/2014   CLINICAL DATA:  Flank pain  EXAM: ULTRASOUND ABDOMEN COMPLETE  COMPARISON:  None.  FINDINGS: Gallbladder: There is an echogenic mobile gallstone. A small amount of sludge is present. There is no gallbladder wall thickening, pericholecystic fluid, or positive sonographic Murphy's sign.  Common bile duct: Diameter: 5.4 mm  Liver: No focal lesion identified. Within normal limits in parenchymal echogenicity.  IVC: No abnormality visualized.  Pancreas: Bowel gas limited evaluation of the pancreas. Only the midbody could be evaluated.  Spleen: Size and appearance within normal limits.  Right Kidney: Length: 8.9 cm. Echogenicity within normal limits. No mass or hydronephrosis visualized.  Left Kidney: Length: 8.5 cm. Increased echotexture within the renal sinuses. There is mild cortical thinning. There is no hydronephrosis.  Abdominal aorta: No aneurysm visualized.  Other findings: There is a right pleural effusion.  IMPRESSION: 1. There is a mobile gallstone without evidence of acute cholecystitis. 2. There is limited evaluation of the pancreas. 3. Increased density in the renal sinuses on the left may reflect nephrocalcinosis. There is no obstruction. 4. There is an right-sided pleural effusion.   Electronically Signed   By: Bret  Martinique   On: 04/21/2014 09:11   Dg  Chest Port 1 View  04/30/2014   CLINICAL DATA:  68 year old male with  anemia and right-sided pleural effusion  EXAM: PORTABLE CHEST - 1 VIEW  COMPARISON:  Prior chest x-ray yesterday 04/29/2014  FINDINGS: Persistent large right layering pleural effusion. No definite pneumothorax. The left lung remains clear. Unchanged cardiac and mediastinal contours. Dense right basilar opacity favored to reflect pleural effusion and atelectasis.  IMPRESSION: Persistent large layering right pleural effusion and associated right basilar atelectasis.  No pneumothorax identified.   Electronically Signed   By: Jacqulynn Cadet M.D.   On: 04/30/2014 09:35   US Thoracentesis Asp Pleural Space W/img Guide  04/29/2014   INDICATION: Symptomatic right sided pleural effusion  EXAM: US THORACENTESIS ASP PLEURAL SPACE W/IMG GUIDE  COMPARISON:  Previous thoracentesis  MEDICATIONS: 10 cc 1% lidocaine  COMPLICATIONS: None immediate  TECHNIQUE: Informed written consent was obtained from the patient after a discussion of the risks, benefits and alternatives to treatment. A timeout was performed prior to the initiation of the procedure.  Initial ultrasound scanning demonstrates a left loculated pleural effusion. The lower chest was prepped and draped in the usual sterile fashion. 1% lidocaine was used for local anesthesia.  Under direct ultrasound guidance, a 19 gauge, 7-cm, Yueh catheter was introduced. An ultrasound image was saved for documentation purposes. The thoracentesis was performed. The catheter was removed and a dressing was applied. The patient tolerated the procedure well without immediate post procedural complication. The patient was escorted to have an upright chest radiograph.  FINDINGS: A total of approximately 200 cc of blood tinge fluid was removed. Requested samples were sent to the laboratory.  IMPRESSION: Successful ultrasound-guided left sided thoracentesis yielding 200 cc of pleural fluid.  Starting blood pressure was 87/51 ; blood pressure dropped to 78/52 still prior to procedure. Call  patient's MD. Was decided only diagnostic study would be performed.  Ending blood pressure 81/56  Read by:  Lavonia Drafts [facility]   Electronically Signed   By: Daryll Brod M.D.   On: 04/29/2014 12:19   US Thoracentesis Asp Pleural Space W/img Guide  04/25/2014   INDICATION: Symptomatic right sided pleural effusion  EXAM: US THORACENTESIS ASP PLEURAL SPACE W/IMG GUIDE  COMPARISON:  None.  MEDICATIONS: 10 cc 1% lidocaine  COMPLICATIONS: None immediate  TECHNIQUE: Informed written consent was obtained from the patient after a discussion of the risks, benefits and alternatives to treatment. A timeout was performed prior to the initiation of the procedure.  Initial ultrasound scanning demonstrates a right pleural effusion. The lower chest was prepped and draped in the usual sterile fashion. 1% lidocaine was used for local anesthesia.  Under direct ultrasound guidance, a 19 gauge, 7-cm, Yueh catheter was introduced. An ultrasound image was saved for documentation purposes. the thoracentesis was performed. The catheter was removed and a dressing was applied. The patient tolerated the procedure well without immediate post procedural complication. The patient was escorted to have an upright chest radiograph.  FINDINGS: A total of approximately 900 cc of blood-tinged dark fluid was removed. Requested samples were sent to the laboratory.  IMPRESSION: Successful ultrasound-guided right sided thoracentesis yielding 900 cc of pleural fluid.  Inferior part of the effusion was loculated.  Patient's blood pressure lowered during procedure; patient was asymptomatic. Procedure was stopped.  Final blood pressure was 96/61  Read by:  Lavonia Drafts [facility]   Electronically Signed   By: Aletta Edouard M.D.   On: 04/25/2014 15:20    Oren Binet, MD  [facility]  Pager:[phone]  If 7PM-7AM, please contact night-coverage [email or web address] Password [email or web address] 05/05/2014, 10:51 AM   LOS: 7 days

## 2014-05-05 NOTE — Brief Op Note (Signed)
04/28/2014 - 05/05/2014  3:09 PM  PATIENT:  [name] [name]  68 y.o. male  PRE-OPERATIVE DIAGNOSIS:  1.RECURRENT RIGHT PLEURAL EFFUSION 2. RIGHT LOWER LOBE MASS  POST-OPERATIVE DIAGNOSIS:  1.RECURRENT RIGHT PLEURAL EFFUSION 2. RIGHT LOWER LOBE MASS  PROCEDURE: RIGHT VIDEO ASSISTED THORACOSCOPY (VATS), RIGHT MINI THORACOTOMY, DRAINAGE OF RIGHT PLEURAL EFFUSION, TALC PLEURODESIS, RLL BIOPSIES  FINDINGS: 1700 cc of bloody like pleural fluid removed  SURGEON:  Surgeon(s) and Role:    * Ivin Poot, MD - Primary  PHYSICIAN ASSISTANT: Lars Pinks PA-C  ANESTHESIA:   general  EBL:  Total I/O In: 1550 [I.V.:1300; IV Piggyback:250] Out: 340 [Urine:190; Blood:150]  BLOOD ADMINISTERED:none  DRAINS: Two 68 French chest tubes in the right pleural space   SPECIMEN:  Source of Specimen:  Right pleural fluid sent for cultures and pathology. RLL biopsoies and right pleural peel biopsy. Frozen section of RLL showed carcinoma.  COUNTS CORRECT:  YES  DICTATION: .Dragon Dictation  PLAN OF CARE: Admit to inpatient   PATIENT DISPOSITION:  PACU - hemodynamically stable.   Delay start of Pharmacological VTE agent (>24hrs) due to surgical blood loss or risk of bleeding: yes

## 2014-05-05 NOTE — Anesthesia Procedure Notes (Signed)
Procedure Name: Intubation Date/Time: 05/05/2014 1:24 PM Performed by: Susa Loffler Pre-anesthesia Checklist: Patient identified, Patient being monitored, Emergency Drugs available, Timeout performed and Suction available Patient Re-evaluated:Patient Re-evaluated prior to inductionOxygen Delivery Method: Circle system utilized Preoxygenation: Pre-oxygenation with 100% oxygen Intubation Type: IV induction Ventilation: Mask ventilation without difficulty Laryngoscope Size: Mac and 3 Grade View: Grade I Endobronchial tube: Left and Double lumen EBT and 37 Fr Number of attempts: 1 Airway Equipment and Method: Stylet Placement Confirmation: ETT inserted through vocal cords under direct vision,  positive ETCO2 and breath sounds checked- equal and bilateral Secured at: 28 cm Tube secured with: Tape Dental Injury: Teeth and Oropharynx as per pre-operative assessment

## 2014-05-05 NOTE — Transfer of Care (Signed)
Immediate Anesthesia Transfer of Care Note  Patient: [name] [name]  Procedure(s) Performed: Procedure(s): VIDEO ASSISTED THORACOSCOPY (VATS)/DECORTICATION OF EMPYEMA (Right)  Patient Location: PACU  Anesthesia Type:General  Level of Consciousness: awake, alert  and oriented  Airway & Oxygen Therapy: Patient Spontanous Breathing and Patient connected to face mask oxygen  Post-op Assessment: Report given to PACU RN and Post -op Vital signs reviewed and stable  Post vital signs: Reviewed and stable  Complications: No apparent anesthesia complications

## 2014-05-05 NOTE — Progress Notes (Signed)
Chaplain visited with [name] [name] this morning around 10 am to go over Advanced directive pt prepared. Pt still in post op.  Will check to complete AD whenever possible.   Delford Field, Chaplain 05/05/2014

## 2014-05-05 NOTE — Consult Note (Signed)
PULMONARY / CRITICAL CARE MEDICINE   Name: [name] [name] MRN: [id number] DOB: [birth year]    ADMISSION DATE:  04/28/2014 CONSULTATION DATE:  04/28/2014  REFERRING MD :  Nils Pyle  CHIEF COMPLAINT:  S/P VATS  INITIAL PRESENTATION: 68 y/o male with HIV followed by Dr. [name] for an exudative effusion was admitted on 12/10 for pain in his abdomen and recurrence of the pleural effusion which had been sampled just three days prior.    STUDIES:  12/7 Outpatient thoracentesis by IR> exudative, cytology negative 12/10 abdominal ultrasound> 13 mm gallstone, mild tenderness over gallbladder without gallbladder thickening, possible left upper pole renal calculus, right pleural effusion noted 12/10 renal ultrasound> no hydronephrosis  12/11 HIDA scan> patent cystic duct without evidence of acute cholecystitis 12/11 Echo> LVEF 65%, RV normal. 12/11 repeat thoracentesis> cytology negative 12/13 endoscopic duodenal biopsies negative for malignancy 12/17 VATS biopsy of right lower lobe lung frozen section shows carcinoma  SIGNIFICANT EVENTS: 12/13 EGD for GI Bleed > 2 large duodenal ulcers not actively bleeding, biopsies taken 12/17 VATS, Mini thoracotomy, drainage of R pleural effusion, Talc pleurdesis, RLL biopsies   HISTORY OF PRESENT ILLNESS:  [name] [name] has HIV and CKD and was admitted on 12/10 for abdominal pain, a recurrent R effusion, and AKI with hyperkalemia.  He had initially been seen by Dr. [name] in the pulmonary office on 12/7 for evaluation of a right pleural effusion. The patient was sent for an interventional radiology guided thoracentesis. Studies from this were consistent with an exudative effusion and cytology was negative. He was then admitted on 04/28/2014 as noted above. On the day after admission he had an echocardiogram and a repeat thoracentesis which again showed negative cytology studies. The echo was normal. Renal was consulted for acute kidney injury. 2 days after admission he  developed a GI bleed. He underwent an EGD by GI medicine which demonstrated 2 duodenal ulcers. On 05/05/2014 he underwent a video-assisted thorascopic surgery by Dr. Tharon Aquas Tright. This demonstrated a right lower lobe effusion which was drained, biopsies were taken of a right lower lobe mass. Top pleurodesis was performed and he was extubated in the operating room. He was transferred to the intensive care unit with chest tubes in place. Pulmonary and critical care medicine was consulted for medical management in the intensive care unit. He reports that breathing is much improved post VATS and pleural fluid drainage. R chest wall pain is well managed with PCA.   PAST MEDICAL HISTORY :   has a past medical history of HIV (human immunodeficiency virus infection) (dx'd 2010); High cholesterol; Bleeding per rectum; Depression; Chronic renal disease; Peripheral neuropathy; and Pleural effusion on right.  has past surgical history that includes Tonsillectomy; Thoracentesis (Right, 04/25/2014); Esophagogastroduodenoscopy (N/A, 05/01/2014); and Flexible sigmoidoscopy (N/A, 05/01/2014). Prior to Admission medications   Medication Sig Start Date End Date Taking? Authorizing Provider  atazanavir-cobicistat (EVOTAZ) 300-150 MG per tablet Take 1 tablet by mouth daily. Swallow whole. Do NOT crush, cut or chew tablet. Take with food. 11/22/13  Yes Michel Bickers, MD  buPROPion (WELLBUTRIN XL) 150 MG 24 hr tablet Take 150 mg by mouth daily.   Yes Historical Provider, MD  Cholecalciferol (VITAMIN D3) 2000 UNITS capsule Take 2,000 Units by mouth daily. 10/16/10 03/09/20 Yes Michel Bickers, MD  HYDROcodone-acetaminophen (NORCO) 5-325 MG per tablet Take 1 tablet by mouth every 6 (six) hours as needed for moderate pain. 04/21/14  Yes Tanda Rockers, MD  lamiVUDine (EPIVIR) 150 MG tablet Take 1 tablet (150  mg total) by mouth daily. 11/22/13 11/22/14 Yes Michel Bickers, MD  Multiple Vitamin (MULTIVITAMIN WITH MINERALS) TABS tablet Take  1 tablet by mouth daily.   Yes Historical Provider, MD  rosuvastatin (CRESTOR) 5 MG tablet Take 1 tablet (5 mg total) by mouth daily. 09/09/13  Yes Michel Bickers, MD  zidovudine (RETROVIR) 300 MG tablet Take 1 tablet (300 mg total) by mouth 2 (two) times daily. 11/22/13 11/22/14 Yes Michel Bickers, MD  Multiple Vitamins-Minerals (CENTRUM SILVER PO) Take 1 tablet by mouth daily.      Historical Provider, MD   No Known Allergies  FAMILY HISTORY:  indicated that his mother is deceased. He indicated that his father is deceased.  SOCIAL HISTORY:  reports that he quit smoking 11 days ago. His smoking use included Cigarettes. He has a 46 pack-year smoking history. He has never used smokeless tobacco. He reports that he drinks about 0.5 oz of alcohol per week. He reports that he uses illicit drugs.  REVIEW OF SYSTEMS:   Bolds are positive  Constitutional: weight loss, gain, night sweats, Fevers, chills, fatigue .  HEENT: headaches, Sore throat, sneezing, nasal congestion, post nasal drip, Difficulty swallowing, Tooth/dental problems, visual complaints visual changes, ear ache CV:  chest pain, radiates: ,Orthopnea, PND, swelling in lower extremities, dizziness, palpitations, syncope.  GI  heartburn, indigestion, abdominal pain, nausea, vomiting, diarrhea, change in bowel habits, loss of appetite, bloody stools.  Resp: cough, productive: , hemoptysis, dyspnea, R chest pain, pleuritic.  Skin: rash or itching or icterus GU: dysuria, change in color of urine, urgency or frequency. flank pain, hematuria  MS: joint pain or swelling. decreased range of motion  Psych: change in mood or affect. depression or anxiety.  Neuro: difficulty with speech, weakness, numbness, ataxia    SUBJECTIVE:   VITAL SIGNS: Temp:  [97.5 F (36.4 C)-98.4 F (36.9 C)] 98.3 F (36.8 C) (12/17 1730) Pulse Rate:  [76-124] 76 (12/17 1730) Resp:  [12-23] 14 (12/17 1730) BP: (85-114)/(50-82) 94/56 mmHg (12/17 1730) SpO2:  [94 %-100  %] 95 % (12/17 1730) Arterial Line BP: (89-122)/(37-47) 100/42 mmHg (12/17 1730) HEMODYNAMICS:   VENTILATOR SETTINGS:   INTAKE / OUTPUT:  Intake/Output Summary (Last 24 hours) at 05/05/14 1830 Last data filed at 05/05/14 1715  Gross per 24 hour  Intake   1673 ml  Output   1230 ml  Net    443 ml    PHYSICAL EXAMINATION: General:  Thin male in NAD Neuro:  Alert, oriented, non-focal HEENT:  [place]/AT, No JVD noted Cardiovascular:  RRR, no MRG Lungs:  Respirations even, unlabored. Bilateral rhonchi R > L Abdomen:  Soft, non-tender, non distended Musculoskeletal: No acute deformity or ROM limitation.  Skin:  Intact, CT x 2 in place in R chest wall draining blood tinged.   LABS:  CBC  Recent Labs Lab 05/04/14 0550 05/04/14 1140 05/05/14 0550  WBC 7.2 6.7 8.2  HGB 9.2* 8.8* 8.9*  HCT 28.6* 26.7* 27.6*  PLT 362 354 387   Coag's  Recent Labs Lab 04/30/14 0550 05/04/14 1140  APTT 45* 43*  INR 1.16 1.09   BMET  Recent Labs Lab 05/04/14 0550 05/04/14 1140 05/05/14 0550  NA 140 137 140  K 4.3 4.3 4.9  CL 100 98 100  CO2 26 27 27   BUN 33* 35* 43*  CREATININE 2.22* 2.20* 2.45*  GLUCOSE 109* 133* 113*   Electrolytes  Recent Labs Lab 05/04/14 0550 05/04/14 1140 05/05/14 0550  CALCIUM 8.7 8.4 9.1   Sepsis Markers  No results for input(s): LATICACIDVEN, PROCALCITON, O2SATVEN in the last 168 hours. ABG  Recent Labs Lab 05/04/14 1245  PHART 7.453*  PCO2ART 40.3  PO2ART 51.8*   Liver Enzymes  Recent Labs Lab 04/29/14 0527 05/04/14 0550 05/04/14 1140  AST 13 29 34  ALT 13 27 29   ALKPHOS 66 85 85  BILITOT 0.7 0.6 0.6  ALBUMIN 1.7* 2.0* 2.0*   Cardiac Enzymes No results for input(s): TROPONINI, PROBNP in the last 168 hours. Glucose  Recent Labs Lab 05/05/14 1805  GLUCAP 114*    Imaging Dg Chest 2 View  05/04/2014   CLINICAL DATA:  Shortness of breath  EXAM: CHEST  2 VIEW  COMPARISON:  Chest x-ray of 05/02/2014, CT chest 04/25/2014   FINDINGS: There is little change in the opacity at the right lung base most consistent with a large right pleural effusion and subsequent right basilar atelectasis. Prominent markings within the right upper lung field remain and could represent pneumonia. Since these prominent markings were not present on recent CT, lymphangitic spread of carcinoma is felt to be unlikely. On the lateral view, there is a pleural-based opacity posteriorly which most likely represents loculated fluid when compared to the recent CT of the chest. The left lung is clear. Heart size is stable. Apical pleural thickening remains.  IMPRESSION: 1. No significant change in large right pleural effusion, right basilar atelectasis, and pleural based opacity on the lateral view probably representing loculated effusion. 2. Prominent markings in the right upper lobe consistent with atelectasis as well as possible pneumonia, not being present on the CT of 04/25/2014.   Electronically Signed   By: Ivar Drape M.D.   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   P:   Monitor BMET and UOP Replace electrolytes as needed  GASTROINTESTINAL A:   Upper GI bleed secondary to duodenal ulcers, currently not actively bleeding  P:   Continue PPI NPO x 4 hours post-op then advance diet per thoracic surgery recommendations   HEMATOLOGIC A:  Anemia, currently not actively bleeding    P:  Monitor CBC intermittently  Transfuse for HGB less than 7  INFECTIOUS A:   HIV   P:   Body fluid culture 12/17>  Anaerobic culture body fluid 12/17 > Ceftriaxone 12/11 through 12/15 Zosyn 12/17 > 12/20 (plan) Cefuroxime 12/17 Anti-retroviral therapy per home regimen  ENDOCRINE A:  Hypercholesterolemia   P:   Continue statin   NEUROLOGIC A:   Postoperative pain   P:   Fentanyl PCA   FAMILY  - Updates:   - Inter-disciplinary family meet or Palliative Care meeting due by:  12/24    Georgann Housekeeper, ACNP [facility] Pager [phone] or [phone]  Attending Note:  I have examined patient, reviewed labs, studies and notes. I have discussed the case with Jaclynn Guarneri and I agree with the data and plans as amended  above.   Baltazar Apo, MD, PhD 05/06/2014, 5:22 AM [facility] [phone] or if no answer [phone]

## 2014-05-05 NOTE — Anesthesia Postprocedure Evaluation (Signed)
  Anesthesia Post-op Note  Patient: [name] [name]  Procedure(s) Performed: Procedure(s): VIDEO ASSISTED THORACOSCOPY (VATS)/DECORTICATION OF EMPYEMA (Right)  Patient Location: PACU  Anesthesia Type:General  Level of Consciousness: awake and alert   Airway and Oxygen Therapy: Patient Spontanous Breathing  Post-op Pain: none  Post-op Assessment: Post-op Vital signs reviewed, Patient's Cardiovascular Status Stable and Respiratory Function Stable  Post-op Vital Signs: Reviewed  Filed Vitals:   05/05/14 1730  BP: 94/56  Pulse: 76  Temp:   Resp: 14    Complications: No apparent anesthesia complications

## 2014-05-05 NOTE — Progress Notes (Signed)
The patient was examined and preop studies reviewed. There has been no change from the prior exam and the patient is ready for surgery.  Plan R VATS , drainage of effusion and decortication of lung on [place]

## 2014-05-05 NOTE — Anesthesia Preprocedure Evaluation (Addendum)
Anesthesia Evaluation  Patient identified by MRN, date of birth, ID band Patient awake    Reviewed: Allergy & Precautions, H&P , NPO status , Patient's Chart, lab work & pertinent test results  Airway Mallampati: II  TM Distance: >3 FB Neck ROM: Full    Dental no notable dental hx.    Pulmonary neg pulmonary ROS, former smoker,    + decreased breath sounds      Cardiovascular negative cardio ROS  Rhythm:Regular Rate:Normal  Left ventricle: The cavity size was normal. Wall thickness was normal. The estimated ejection fraction was 65%. Wall motion was normal; there were no regional wall motion abnormalities. - Right ventricle: The cavity size was normal. Systolic function was normal.    Neuro/Psych negative neurological ROS  negative psych ROS   GI/Hepatic negative GI ROS, Neg liver ROS,   Endo/Other  negative endocrine ROS  Renal/GU CRFRenal disease  negative genitourinary   Musculoskeletal negative musculoskeletal ROS (+)   Abdominal   Peds negative pediatric ROS (+)  Hematology  (+) anemia , HIV,   Anesthesia Other Findings   Reproductive/Obstetrics negative OB ROS                            Anesthesia Physical Anesthesia Plan  ASA: III  Anesthesia Plan: General   Post-op Pain Management:    Induction: Intravenous  Airway Management Planned: Double Lumen EBT  Additional Equipment: Arterial line  Intra-op Plan:   Post-operative Plan: Extubation in OR  Informed Consent:   Dental advisory given  Plan Discussed with: CRNA and Surgeon  Anesthesia Plan Comments:        Anesthesia Quick Evaluation

## 2014-05-06 ENCOUNTER — Other Ambulatory Visit: Payer: Self-pay | Admitting: Pulmonary Disease

## 2014-05-06 ENCOUNTER — Encounter (HOSPITAL_COMMUNITY): Payer: Self-pay | Admitting: Cardiothoracic Surgery

## 2014-05-06 ENCOUNTER — Inpatient Hospital Stay (HOSPITAL_COMMUNITY): Payer: Commercial Managed Care - HMO

## 2014-05-06 DIAGNOSIS — J948 Other specified pleural conditions: Secondary | ICD-10-CM

## 2014-05-06 DIAGNOSIS — R0602 Shortness of breath: Secondary | ICD-10-CM | POA: Insufficient documentation

## 2014-05-06 LAB — BASIC METABOLIC PANEL
Anion gap: 11 (ref 5–15)
BUN: 42 mg/dL — AB (ref 6–23)
CHLORIDE: 106 meq/L (ref 96–112)
CO2: 22 meq/L (ref 19–32)
Calcium: 7.8 mg/dL — ABNORMAL LOW (ref 8.4–10.5)
Creatinine, Ser: 2.2 mg/dL — ABNORMAL HIGH (ref 0.50–1.35)
GFR calc non Af Amer: 29 mL/min — ABNORMAL LOW (ref >90)
GFR, EST AFRICAN AMERICAN: 34 mL/min — AB (ref >90)
Glucose, Bld: 110 mg/dL — ABNORMAL HIGH (ref 70–99)
POTASSIUM: 5 meq/L (ref 3.7–5.3)
Sodium: 139 mEq/L (ref 137–147)

## 2014-05-06 LAB — CBC
HEMATOCRIT: 21.9 % — AB (ref 39.0–52.0)
HEMOGLOBIN: 7.1 g/dL — AB (ref 13.0–17.0)
MCH: 38.4 pg — AB (ref 26.0–34.0)
MCHC: 32.4 g/dL (ref 30.0–36.0)
MCV: 118.4 fL — AB (ref 78.0–100.0)
Platelets: 353 10*3/uL (ref 150–400)
RBC: 1.85 MIL/uL — ABNORMAL LOW (ref 4.22–5.81)
RDW: 20.4 % — AB (ref 11.5–15.5)
WBC: 10.4 10*3/uL (ref 4.0–10.5)

## 2014-05-06 LAB — BLOOD GAS, ARTERIAL
ACID-BASE DEFICIT: 2.7 mmol/L — AB (ref 0.0–2.0)
BICARBONATE: 21.8 meq/L (ref 20.0–24.0)
O2 Content: 4 L/min
O2 SAT: 89.9 %
PO2 ART: 59.6 mmHg — AB (ref 80.0–100.0)
Patient temperature: 98.6
TCO2: 23 mmol/L (ref 0–100)
pCO2 arterial: 38.9 mmHg (ref 35.0–45.0)
pH, Arterial: 7.367 (ref 7.350–7.450)

## 2014-05-06 LAB — GLUCOSE, CAPILLARY
GLUCOSE-CAPILLARY: 113 mg/dL — AB (ref 70–99)
GLUCOSE-CAPILLARY: 109 mg/dL — AB (ref 70–99)
Glucose-Capillary: 118 mg/dL — ABNORMAL HIGH (ref 70–99)
Glucose-Capillary: 123 mg/dL — ABNORMAL HIGH (ref 70–99)
Glucose-Capillary: 129 mg/dL — ABNORMAL HIGH (ref 70–99)

## 2014-05-06 LAB — PREPARE RBC (CROSSMATCH)

## 2014-05-06 MED ORDER — SODIUM CHLORIDE 0.9 % IV SOLN
Freq: Once | INTRAVENOUS | Status: AC
  Administered 2014-05-06: 10:00:00 via INTRAVENOUS

## 2014-05-06 MED ORDER — TRAMADOL HCL 50 MG PO TABS
50.0000 mg | ORAL_TABLET | Freq: Two times a day (BID) | ORAL | Status: DC | PRN
Start: 2014-05-06 — End: 2014-05-09

## 2014-05-06 MED ORDER — ENSURE COMPLETE PO LIQD
237.0000 mL | Freq: Three times a day (TID) | ORAL | Status: DC
  Administered 2014-05-06 – 2014-05-07 (×5): 237 mL via ORAL

## 2014-05-06 NOTE — Progress Notes (Signed)
Arterial BP noted to be 61/43 at 0900. Pt again asymptomatic, remained A&O, no diaphoresis noted.  Dr. Elsworth Soho on unit and notified. Verbal order given for  250cc bolus. BP increased to 101/49. PRBC started at 0930. Will continue to monitor patient.

## 2014-05-06 NOTE — Progress Notes (Signed)
NUTRITION FOLLOW UP  Pt meets criteria for SEVERE MALNUTRITION in the context of chronic illness as evidenced by energy intake </= 75% for >/= 1 month and severe muscle mass loss.  DOCUMENTATION CODES Per approved criteria  -Severe malnutrition in the context of chronic illness   Intervention:    D/C Nepro supplements, patient does not like.  Add Ensure Complete PO TID, each supplement provides 350 kcal and 13 grams of protein  Nutrition Dx:   Increased nutrition needs related to chronic illness as evidenced by estimated nutrition needs. Ongoing.  Goal:   Intake to meet >90% of estimated nutrition needs.  Monitor:   PO intake, labs, weight trend.  Assessment:   Pt with past medical history of chronic renal disease with a worsening creatinine, HIV with undetectable viral load, on antiviral therapy came into the hospital several days to admission for abdominal pain he was diagnosed with gallbladder stones. CT scan of the chest showed a mass and effusion had a thoracocentesis that showed fluid, no malignancies, he relates his abdominal pain is made worse by eating.  Patient does not like the Nepro supplements that are ordered for him. He prefers Ensure Plus supplements, butter pecan flavor. Will d/c Nepro and order Ensure Complete PO TID.  Height: Ht Readings from Last 1 Encounters:  05/05/14 5\' 6"  (1.676 m)    Weight Status:   Wt Readings from Last 1 Encounters:  05/05/14 122 lb 5.7 oz (55.5 kg)   04/28/14 122 lb (55.339 kg)    Re-estimated needs:  Kcal: 1900-2100 Protein: 85-100 gm Fluid: 2 L  Skin: no issues  Diet Order: Diet clear liquid, advance as tolerated   Intake/Output Summary (Last 24 hours) at 05/06/14 1244 Last data filed at 05/06/14 1200  Gross per 24 hour  Intake 3823.75 ml  Output   3145 ml  Net 678.75 ml    Last BM: 12/16   Labs:   Recent Labs Lab 05/04/14 1140 05/05/14 0550 05/06/14 0500  NA 137 140 139  K 4.3 4.9 5.0  CL 98  100 106  CO2 27 27 22   BUN 35* 43* 42*  CREATININE 2.20* 2.45* 2.20*  CALCIUM 8.4 9.1 7.8*  GLUCOSE 133* 113* 110*    CBG (last 3)   Recent Labs  05/05/14 1805 05/05/14 2355 05/06/14 0640  GLUCAP 114* 123* 113*    Scheduled Meds: . acetaminophen  1,000 mg Oral 4 times per day   Or  . acetaminophen (TYLENOL) oral liquid 160 mg/5 mL  1,000 mg Oral 4 times per day  . buPROPion  150 mg Oral Daily  . cholecalciferol  2,000 Units Oral Daily  . darunavir-cobicistat  1 tablet Oral Q breakfast  . feeding supplement (ENSURE COMPLETE)  237 mL Oral TID BM  . feeding supplement (NEPRO CARB STEADY)  237 mL Oral TID BM  . fentaNYL   Intravenous 6 times per day  . insulin aspart  0-24 Units Subcutaneous 4 times per day  . lamiVUDine  100 mg Oral Daily  . multivitamin with minerals  1 tablet Oral Daily  . pantoprazole  40 mg Oral BID  . piperacillin-tazobactam (ZOSYN)  IV  3.375 g Intravenous Q8H  . rosuvastatin  5 mg Oral Daily  . sucralfate  1 g Oral TID WC & HS  . zidovudine  300 mg Oral Q12H    Continuous Infusions: . dextrose 5 % and 0.9% NaCl 75 mL/hr at 05/05/14 1849    Molli Barrows, RD, LDN, [place] Pager  [phone] After Hours Pager [phone]

## 2014-05-06 NOTE — Progress Notes (Addendum)
      [address]       [place] [address]             [phone]       1 Day Post-Op Procedure(s) (LRB): VIDEO ASSISTED THORACOSCOPY (VATS)/DECORTICATION OF EMPYEMA (Right)  Subjective: Patient eating broth this am. Has incisional pain  Objective: Vital signs in last 24 hours: Temp:  [97.5 F (36.4 C)-98.8 F (37.1 C)] 97.9 F (36.6 C) (12/18 0803) Pulse Rate:  [76-112] 102 (12/18 0600) Cardiac Rhythm:  [-] Normal sinus rhythm (12/17 2000) Resp:  [12-31] 28 (12/18 0600) BP: (78-105)/(45-82) 83/45 mmHg (12/18 0600) SpO2:  [90 %-100 %] 90 % (12/18 0600) Arterial Line BP: (89-122)/(37-47) 100/42 mmHg (12/17 1730) Weight:  [122 lb 5.7 oz (55.5 kg)] 122 lb 5.7 oz (55.5 kg) (12/17 1830)     Intake/Output from previous day: 12/17 0701 - 12/18 0700 In: 2488.8 [I.V.:2138.8; IV Piggyback:350] Out: 2505 [Urine:1265; Blood:150; Chest Tube:1090]   Physical Exam:  Cardiovascular: Slightly tachy Pulmonary: Coarse on the right Abdomen: Soft, non tender, bowel sounds present. Extremities: Mild bilateral lower extremity edema. Wounds: Dressing is clean and dry.   Chest Tubes: to suction and there is an air leak that worsens with cough  Lab Results: CBC: Recent Labs  05/05/14 0550 05/06/14 0500  WBC 8.2 10.4  HGB 8.9* 7.1*  HCT 27.6* 21.9*  PLT 387 353   BMET:  Recent Labs  05/05/14 0550 05/06/14 0500  NA 140 139  K 4.9 5.0  CL 100 106  CO2 27 22  GLUCOSE 113* 110*  BUN 43* 42*  CREATININE 2.45* 2.20*  CALCIUM 9.1 7.8*    PT/INR:  Recent Labs  05/04/14 1140  LABPROT 14.3  INR 1.09   ABG:  INR: Will add last result for INR, ABG once components are confirmed Will add last 4 CBG results once components are confirmed  Assessment/Plan:  1. CV - Slightly tachy in low 100's this am. 2.  Pulmonary - Chest tubes with 1090 since surgery.  There is an air leak that worsens with cough. CXR this am shows small right basilar pneumothorax,  bibasilar patchy opacities L>R, subcutaneous emphysema right lateral chest wall. Chest tubes to remain to suction. Encourage incentive spirometer 3. Anemia- H and H 7.1 and 21.9. Would benefit from transfusion so will order 2 units PRBC. Had previous GI bleed (duodenal ulcer, NSAID use). No hematochezia. Monitor closely. 4. Acute on chronic kidney disease-Creatinine down from 2.45 to 2.2  ZIMMERMAN,DONIELLE MPA-C 05/06/2014,8:40 AM   patient examined and medical record reviewed,agree with above note. Agree with transfusion due to acute on chronic anemia and CKD Air leak 2+ from decorication of peel off RLL Final path pending VAN TRIGT III,PETER 05/06/2014

## 2014-05-06 NOTE — Progress Notes (Signed)
BP 70s/40s; pt asymptomatic; awake, alert/oriented, follows commands; pt says his BP "always runs low at home"; pt is on PCA fentanyl and has used 20 mcg in past 2 hrs; notified E-link MD; no orders at this time; continue to monitor Blair Hailey, RN

## 2014-05-07 ENCOUNTER — Inpatient Hospital Stay (HOSPITAL_COMMUNITY): Payer: Commercial Managed Care - HMO

## 2014-05-07 LAB — COMPREHENSIVE METABOLIC PANEL
ALBUMIN: 1.6 g/dL — AB (ref 3.5–5.2)
ALK PHOS: 59 U/L (ref 39–117)
ALT: 15 U/L (ref 0–53)
AST: 21 U/L (ref 0–37)
Anion gap: 12 (ref 5–15)
BILIRUBIN TOTAL: 0.5 mg/dL (ref 0.3–1.2)
BUN: 41 mg/dL — ABNORMAL HIGH (ref 6–23)
CHLORIDE: 108 meq/L (ref 96–112)
CO2: 20 meq/L (ref 19–32)
CREATININE: 2.06 mg/dL — AB (ref 0.50–1.35)
Calcium: 7.8 mg/dL — ABNORMAL LOW (ref 8.4–10.5)
GFR calc Af Amer: 37 mL/min — ABNORMAL LOW (ref >90)
GFR, EST NON AFRICAN AMERICAN: 32 mL/min — AB (ref >90)
Glucose, Bld: 125 mg/dL — ABNORMAL HIGH (ref 70–99)
POTASSIUM: 4.5 meq/L (ref 3.7–5.3)
Sodium: 140 mEq/L (ref 137–147)
Total Protein: 5.1 g/dL — ABNORMAL LOW (ref 6.0–8.3)

## 2014-05-07 LAB — CBC
HEMATOCRIT: 29.3 % — AB (ref 39.0–52.0)
Hemoglobin: 9.7 g/dL — ABNORMAL LOW (ref 13.0–17.0)
MCH: 34.9 pg — AB (ref 26.0–34.0)
MCHC: 33.1 g/dL (ref 30.0–36.0)
MCV: 105.4 fL — AB (ref 78.0–100.0)
PLATELETS: 331 10*3/uL (ref 150–400)
RBC: 2.78 MIL/uL — ABNORMAL LOW (ref 4.22–5.81)
WBC: 10.2 10*3/uL (ref 4.0–10.5)

## 2014-05-07 LAB — TYPE AND SCREEN
ABO/RH(D): A POS
Antibody Screen: NEGATIVE
Unit division: 0
Unit division: 0

## 2014-05-07 LAB — GLUCOSE, CAPILLARY
GLUCOSE-CAPILLARY: 129 mg/dL — AB (ref 70–99)
GLUCOSE-CAPILLARY: 113 mg/dL — AB (ref 70–99)
GLUCOSE-CAPILLARY: 102 mg/dL — AB (ref 70–99)
Glucose-Capillary: 147 mg/dL — ABNORMAL HIGH (ref 70–99)

## 2014-05-07 LAB — CORTISOL: CORTISOL PLASMA: 30.4 ug/dL

## 2014-05-07 NOTE — Op Note (Signed)
NAME:  [name] [name], [name] [name] NO.:  [id number]  MEDICAL RECORD NO.:  [id number]  LOCATION:  2M04C                        FACILITY:  [facility]  PHYSICIAN:  Ivin Poot, M.D.  DATE OF BIRTH:  August 19, 1945  DATE OF PROCEDURE:  05/05/2014 DATE OF DISCHARGE:                              OPERATIVE REPORT   OPERATIONS: 1. Right VATS (video-assisted thoracoscopic surgery) with drainage of     loculated pleural effusion - empyema. 2. Biopsy of right lower lobe mass. 3. Talc pleurodesis of the right pleural space.  PREOPERATIVE DIAGNOSES:  Loculated right pleural effusion, pleural fluid cytology negative x2, intraoperative biopsy of right lower lobe mass, consistent with malignancy.  POSTOPERATIVE DIAGNOSES:  Loculated right pleural effusion, pleural fluid cytology negative x2, intraoperative biopsy of right lower lobe mass, consistent with malignancy.  SURGEON:  Ivin Poot, M.D.  ASSISTANT:  Lars Pinks, PA  ANESTHESIA:  General by Soledad Gerlach, MD  INDICATION:  The patient is a 68 year old male smoker with history of treated HIV and chronic renal insufficiency, who presented with right chest pain, weight loss, shortness of breath, and a loculated pleural effusion.  The pleural effusion was incompletely drained with thoracentesis and had negative cytology.  During that hospitalization, he developed acute upper GI bleeding, was endoscoped and found to have a large necrotic ulcer in his duodenum bulb which was treated endoscopically.  After he recovered from this, he was prepared for right VATS for drainage of the loculated effusion which was felt to be possible empyema versus malignancy versus a combination of both.  I discussed the procedure in detail with the patient including the risks of postoperative infection, ventilator dependence, recurrent effusion, prolonged air leak, renal failure, and death.  He demonstrated his understanding and agreed to  proceed with surgery under what I felt was an informed consent.  OPERATIVE FINDINGS:  Large loculated effusion with fresh adhesions of fibrin and exudative material, consistent with empyema, but large mass in the right lower lobe with entrapment of the right lower lobe and pleural studding of nodules, consistent with neoplasm.  DESCRIPTION OF PROCEDURE:  The patient was brought to the operating room, placed supine on the operating table.  General anesthesia was induced under invasive hemodynamic monitoring.  The patient was intubated with a double-lumen endotracheal tube and positioned right side up.  The right chest was prepped and draped as a sterile field.  A proper time-out was performed.  A small incision was made at the tip of the scapula in the fifth interspace.  The scope camera was inserted, however, the pleural space was totally obliterated.  The incision was extended to approximately 6 to 7 cm.  A small chest spreading retractor was placed.  Fibrinous adhesions with fibrinous exudate over the lung and mediastinal pleura were identified.  These adhesions were swept away and removed and pockets of fluid which was serosanguineous was removed - over 1.5 L of fluid.  After completely draining all the pockets of fluid, it was apparent that there was a firm mass which was entrapping the right lower lobe and it extruded through the right lower lobe into the mediastinal pleura.  This material was  removed from the pleura and submitted for Pathology.  A separate biopsy of the mass within the right lower lobe parenchyma was shaved off and submitted for frozen section which was consistent with neoplasm.  The right lower lobe laterally had a peel which was decorticated and sent for culture and pathology as well.  The chest was then irrigated with warm saline.  I then placed 5 g of talc insufflation into the pleural space to help reduce the return of a malignant effusion.  There was a good  coating of the right pleural space.  Two chest tubes were then placed to drain the right pleural space superiorly, anteriorly, and inferiorly.  These were brought out through separate incisions and secured to a Pleur-Evac drainage system.  The lung was then re-expanded under direct vision.  There was still some space remaining, but the pleural space filled in fairly well after the drainage and decortication.  The incision was closed with interrupted #2 Vicryl for the pericostal. The muscle layers were closed in layers using #1 Vicryl and subcutaneous and skin layers were closed in running Vicryl.  Sterile dressing was applied.  The patient was extubated and returned to the recovery room in critical, but stable condition.     Ivin Poot, M.D.     PV/MEDQ  D:  05/06/2014  T:  05/07/2014  Job:  [id number]

## 2014-05-07 NOTE — Progress Notes (Signed)
PULMONARY / CRITICAL CARE MEDICINE   Name: [name] [name] MRN: [id number] DOB: 09/04/1945    ADMISSION DATE:  04/28/2014 CONSULTATION DATE:  04/28/2014  REFERRING MD :  Nils Pyle  CHIEF COMPLAINT:  S/P VATS  INITIAL PRESENTATION: 68 y/o male with HIV followed by Dr. [name] for an exudative effusion was admitted on 12/10 for pain in his abdomen and recurrence of the pleural effusion which had been sampled just three days prior.    STUDIES:  12/7 Outpatient thoracentesis by IR> exudative, cytology negative 12/10 abdominal ultrasound> 13 mm gallstone, mild tenderness over gallbladder without gallbladder thickening, possible left upper pole renal calculus, right pleural effusion noted 12/10 renal ultrasound> no hydronephrosis  12/11 HIDA scan> patent cystic duct without evidence of acute cholecystitis 12/11 Echo> LVEF 65%, RV normal. 12/11 repeat thoracentesis> cytology negative 12/13 endoscopic duodenal biopsies negative for malignancy 12/17 VATS biopsy of right lower lobe lung frozen section shows carcinoma 12/19: no distress. Still has airleak, but think that this is possibly d/t ant CT being out of place   SIGNIFICANT EVENTS: 12/13 EGD for GI Bleed > 2 large duodenal ulcers not actively bleeding, biopsies taken 12/17 VATS, Mini thoracotomy, drainage of R pleural effusion, Talc pleurdesis, RLL biopsies  SUBJECTIVE:   VITAL SIGNS: Temp:  [97.4 F (36.3 C)-98.5 F (36.9 C)] 97.5 F (36.4 C) (12/19 0738) Pulse Rate:  [83-103] 87 (12/19 0500) Resp:  [14-29] 14 (12/19 0752) BP: (79-102)/(42-59) 92/49 mmHg (12/19 0400) SpO2:  [88 %-98 %] 94 % (12/19 0752) FiO2 (%):  [28 %] 28 % (12/18 0915) 2 liters     VENTILATOR SETTINGS: Vent Mode:  [-]  FiO2 (%):  [28 %] 28 % INTAKE / OUTPUT:  Intake/Output Summary (Last 24 hours) at 05/07/14 0277 Last data filed at 05/07/14 0600  Gross per 24 hour  Intake 2672.5 ml  Output   2480 ml  Net  192.5 ml    PHYSICAL EXAMINATION: General:   Thin male in NAD Neuro:  Alert, oriented, non-focal HEENT:  [place]/AT, No JVD noted Cardiovascular:  RRR, no MRG Lungs:  Respirations even, unlabored. Bilateral rhonchi R > L; 7/7 airleak.  Abdomen:  Soft, non-tender, non distended Musculoskeletal: No acute deformity or ROM limitation.  Skin:  Intact, CT x 2 in place in R chest wall draining blood tinged.   LABS:  CBC  Recent Labs Lab 05/04/14 1140 05/05/14 0550 05/06/14 0500  WBC 6.7 8.2 10.4  HGB 8.8* 8.9* 7.1*  HCT 26.7* 27.6* 21.9*  PLT 354 387 353   Coag's  Recent Labs Lab 05/04/14 1140  APTT 43*  INR 1.09   BMET  Recent Labs Lab 05/05/14 0550 05/06/14 0500 05/07/14 0500  NA 140 139 140  K 4.9 5.0 4.5  CL 100 106 108  CO2 27 22 20   BUN 43* 42* 41*  CREATININE 2.45* 2.20* 2.06*  GLUCOSE 113* 110* 125*   Electrolytes  Recent Labs Lab 05/05/14 0550 05/06/14 0500 05/07/14 0500  CALCIUM 9.1 7.8* 7.8*   Sepsis Markers No results for input(s): LATICACIDVEN, PROCALCITON, O2SATVEN in the last 168 hours. ABG  Recent Labs Lab 05/04/14 1245 05/06/14 0224  PHART 7.453* 7.367  PCO2ART 40.3 38.9  PO2ART 51.8* 59.6*   Liver Enzymes  Recent Labs Lab 05/04/14 0550 05/04/14 1140 05/07/14 0500  AST 29 34 21  ALT 27 29 15   ALKPHOS 85 85 59  BILITOT 0.6 0.6 0.5  ALBUMIN 2.0* 2.0* 1.6*   Cardiac Enzymes No results for input(s): TROPONINI, PROBNP in  the last 168 hours. Glucose  Recent Labs Lab 05/06/14 0640 05/06/14 1230 05/06/14 1606 05/06/14 1856 05/06/14 2353 05/07/14 0600  GLUCAP 113* 129* 118* 109* 147* 129*    Imaging Dg Chest Port 1 View  05/06/2014   CLINICAL DATA:  Follow up pneumothorax.  EXAM: PORTABLE CHEST - 1 VIEW  COMPARISON:  05/04/2014 and 05/05/2014.  FINDINGS: 0500 hr. Two right-sided chest tubes inferiorly in the right hemithorax are unchanged in position. The side port of 1 of these remains external to the chest wall. There has been partial re-expansion the right lung  with decreased size of the right basilar pneumothorax. Some soft tissue emphysema remains within the right chest wall. There is underlying right basilar pulmonary opacity. Mild left basilar atelectasis is present. The heart size is stable.  IMPRESSION: Partial re-expansion of right basilar pneumothorax. Patchy bibasilar pulmonary opacities, slightly increased on the left.   Electronically Signed   By: Camie Patience M.D.   On: 05/06/2014 07:30  no sig changen in post-op changes. 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  Incentive spirometry Out of bed as soon as possible Oxygen as needed for O2 saturation greater than 92%  CXR in AM  CARDIOVASCULAR CVL none R rad art line 12/17 >out  A:  No acute issues  P:  Telemetry monitoring   RENAL A:   Acute on chronic kidney injury, initially improved P:   Monitor BMET and UOP Replace electrolytes as needed  GASTROINTESTINAL A:   Upper GI bleed secondary to duodenal ulcers, currently not actively bleeding  P:   Continue PPI Adv diet as tol   HEMATOLOGIC A:  Anemia, currently not actively bleeding   P:  Monitor CBC intermittently  Transfuse for HGB less than 7  INFECTIOUS A:   HIV   P:   Body fluid culture 12/17>  Anaerobic culture body fluid 12/17 > Ceftriaxone 12/11 through 12/15 Zosyn 12/17 > 12/20 (plan) Cefuroxime 12/17 Anti-retroviral therapy per home regimen  ENDOCRINE A:  Hypercholesterolemia   P:   Continue statin   NEUROLOGIC A:   Postoperative pain   P:   Fentanyl PCA   FAMILY  - Updates:   - Inter-disciplinary family meet or Palliative Care meeting due by:  12/24     NP SUMMARY No distress. Slowly improving. Ready for transfer to SDU. Think that his anterior CT is out of chest wall. If clamped then he has no airleak and the other tube does not leak. WIll defer to thoracic surgery.    Attending Note:  I have examined patient, reviewed labs, studies and notes. I have discussed the case with Jerrye Bushy and I agree with the data and plans as amended above.   Baltazar Apo, MD, PhD 05/07/2014, 11:27 AM [facility] [phone] or if no answer [phone]

## 2014-05-07 NOTE — Progress Notes (Signed)
Patient had large bowel movement with active bleeding present. Some clots present as well.

## 2014-05-07 NOTE — Progress Notes (Addendum)
      [address]       [place] [address]             [phone]       2 Days Post-Op Procedure(s) (LRB): VIDEO ASSISTED THORACOSCOPY (VATS)/DECORTICATION OF EMPYEMA (Right)  Subjective: Patient with incisional pain  Objective: Vital signs in last 24 hours: Temp:  [97.4 F (36.3 C)-98.5 F (36.9 C)] 97.5 F (36.4 C) (12/19 0738) Pulse Rate:  [78-103] 78 (12/19 1000) Cardiac Rhythm:  [-] Normal sinus rhythm (12/19 1000) Resp:  [14-29] 23 (12/19 1000) BP: (79-102)/(42-59) 79/46 mmHg (12/19 1000) SpO2:  [88 %-98 %] 94 % (12/19 1000)     Intake/Output from previous day: 12/18 0701 - 12/19 0700 In: 2747.5 [I.V.:1900; Blood:660; IV Piggyback:137.5] Out: 2715 [Urine:1705; Chest Tube:1010]   Physical Exam:  Cardiovascular: RRR Pulmonary: Coarse on the right Abdomen: Soft, non tender, bowel sounds present. Extremities: Trace bilateral lower extremity edema. Wounds: Dressing is clean and dry.   Chest Tubes: to suction and there is an air leak that worsens with cough  Lab Results: CBC:  Recent Labs  05/05/14 0550 05/06/14 0500  WBC 8.2 10.4  HGB 8.9* 7.1*  HCT 27.6* 21.9*  PLT 387 353   BMET:   Recent Labs  05/06/14 0500 05/07/14 0500  NA 139 140  K 5.0 4.5  CL 106 108  CO2 22 20  GLUCOSE 110* 125*  BUN 42* 41*  CREATININE 2.20* 2.06*  CALCIUM 7.8* 7.8*    PT/INR:   Recent Labs  05/04/14 1140  LABPROT 14.3  INR 1.09   ABG:  INR: Will add last result for INR, ABG once components are confirmed Will add last 4 CBG results once components are confirmed  Assessment/Plan:  1. CV - SR in the 70's this am. 2.  Pulmonary - Chest tubes with 1010 since surgery.  There is an air leak that worsens with cough. He had a 2+ air leak after surgery. CXR this am appears stable. Chest tube holes are not outside chest. Chest tubes to remain to suction. Per Dr. Roxan Hockey, will place chest tubes to water seal in am prior to CXR.Encourage  incentive spirometer. Await final pathology. 3. Anemia- H and H pending this am. Received 2 units of PRBC yesterday. Had previous GI bleed (duodenal ulcer, NSAID use). H. Pylori negative.No hematochezia.  4. Acute on chronic kidney disease-Creatinine down from 2.2 to 2.06  ZIMMERMAN,DONIELLE MPA-C 05/07/2014,10:42 AM   05/07/2014  Patient seen and examined, agree with above Will try CT to water seal in AM prior to CXR, air leak will likely heal more rapidly if lung will stay up off suction

## 2014-05-08 ENCOUNTER — Inpatient Hospital Stay (HOSPITAL_COMMUNITY): Payer: Commercial Managed Care - HMO

## 2014-05-08 LAB — BODY FLUID CULTURE
Culture: NO GROWTH
GRAM STAIN: NONE SEEN

## 2014-05-08 LAB — GLUCOSE, CAPILLARY
GLUCOSE-CAPILLARY: 137 mg/dL — AB (ref 70–99)
Glucose-Capillary: 108 mg/dL — ABNORMAL HIGH (ref 70–99)
Glucose-Capillary: 109 mg/dL — ABNORMAL HIGH (ref 70–99)
Glucose-Capillary: 131 mg/dL — ABNORMAL HIGH (ref 70–99)

## 2014-05-08 MED ORDER — GUAIFENESIN ER 600 MG PO TB12
600.0000 mg | ORAL_TABLET | Freq: Two times a day (BID) | ORAL | Status: DC
  Administered 2014-05-08 (×2): 600 mg via ORAL
  Filled 2014-05-08 (×4): qty 1

## 2014-05-08 NOTE — Progress Notes (Addendum)
PULMONARY / CRITICAL CARE MEDICINE   Name: [name] [name] MRN: [id number] DOB: 1946-05-13    ADMISSION DATE:  04/28/2014 CONSULTATION DATE:  04/28/2014  REFERRING MD :  Nils Pyle  CHIEF COMPLAINT:  S/P VATS  INITIAL PRESENTATION: 68 y/o male with HIV followed by Dr. [name] for an exudative effusion was admitted on 12/10 for pain in his abdomen and recurrence of the pleural effusion which had been sampled just three days prior.    STUDIES:  12/7 Outpatient thoracentesis by IR> exudative, cytology negative 12/10 abdominal ultrasound> 13 mm gallstone, mild tenderness over gallbladder without gallbladder thickening, possible left upper pole renal calculus, right pleural effusion noted 12/10 renal ultrasound> no hydronephrosis  12/11 HIDA scan> patent cystic duct without evidence of acute cholecystitis 12/11 Echo> LVEF 65%, RV normal. 12/11 repeat thoracentesis> cytology negative 12/13 endoscopic duodenal biopsies negative for malignancy 12/17 VATS biopsy of right lower lobe lung frozen section shows carcinoma 12/19: no distress. Still has airleak, but think that this is possibly d/t ant CT being out of place   SIGNIFICANT EVENTS: 12/13 EGD for GI Bleed > 2 large duodenal ulcers not actively bleeding, biopsies taken 12/17 VATS, Mini thoracotomy, drainage of R pleural effusion, Talc pleurdesis, RLL biopsies  SUBJECTIVE:  No distress   VITAL SIGNS: Temp:  [97.4 F (36.3 C)-98.8 F (37.1 C)] 98.6 F (37 C) (12/20 0800) Pulse Rate:  [78-115] 98 (12/20 0800) Resp:  [12-36] 17 (12/20 0800) BP: (77-114)/(40-75) 89/51 mmHg (12/20 0800) SpO2:  [86 %-99 %] 89 % (12/20 0800) FiO2 (%):  [28 %] 28 % (12/19 1244) 2 liters     VENTILATOR SETTINGS: Vent Mode:  [-]  FiO2 (%):  [28 %] 28 % INTAKE / OUTPUT:  Intake/Output Summary (Last 24 hours) at 05/08/14 0851 Last data filed at 05/08/14 0800  Gross per 24 hour  Intake   2025 ml  Output   2025 ml  Net      0 ml    PHYSICAL  EXAMINATION: General:  Thin male in NAD Neuro:  Alert, oriented, non-focal HEENT:  [place]/AT, No JVD noted Cardiovascular:  RRR, no MRG Lungs:  Respirations even, unlabored. Bilateral rhonchi R > L; 7/7 airleak.  Abdomen:  Soft, non-tender, non distended Musculoskeletal: No acute deformity or ROM limitation.  Skin:  Intact, CT x 2 in place in R chest wall draining blood tinged.   LABS:  CBC  Recent Labs Lab 05/04/14 1140 05/05/14 0550 05/06/14 0500  WBC 6.7 8.2 10.4  HGB 8.8* 8.9* 7.1*  HCT 26.7* 27.6* 21.9*  PLT 354 387 353   Coag's  Recent Labs Lab 05/04/14 1140  APTT 43*  INR 1.09   BMET  Recent Labs Lab 05/05/14 0550 05/06/14 0500 05/07/14 0500  NA 140 139 140  K 4.9 5.0 4.5  CL 100 106 108  CO2 27 22 20   BUN 43* 42* 41*  CREATININE 2.45* 2.20* 2.06*  GLUCOSE 113* 110* 125*   Electrolytes  Recent Labs Lab 05/05/14 0550 05/06/14 0500 05/07/14 0500  CALCIUM 9.1 7.8* 7.8*   Sepsis Markers No results for input(s): LATICACIDVEN, PROCALCITON, O2SATVEN in the last 168 hours. ABG  Recent Labs Lab 05/04/14 1245 05/06/14 0224  PHART 7.453* 7.367  PCO2ART 40.3 38.9  PO2ART 51.8* 59.6*   Liver Enzymes  Recent Labs Lab 05/04/14 0550 05/04/14 1140 05/07/14 0500  AST 29 34 21  ALT 27 29 15   ALKPHOS 85 85 59  BILITOT 0.6 0.6 0.5  ALBUMIN 2.0* 2.0* 1.6*  Cardiac Enzymes No results for input(s): TROPONINI, PROBNP in the last 168 hours. Glucose  Recent Labs Lab 05/06/14 2353 05/07/14 0600 05/07/14 1109 05/07/14 1801 05/07/14 2352 05/08/14 0626  GLUCAP 147* 129* 113* 102* 109* 108*    Imaging Dg Chest Port 1v Same Day  05/07/2014   CLINICAL DATA:  Right-sided pneumothorax  EXAM: PORTABLE CHEST - 1 VIEW SAME DAY  COMPARISON:  May 06, 2014  FINDINGS: Two chest tubes remain on the right. One of these chest tubes has the side port just outside of the chest wall. This appearance is stable. There is a loculated pneumothorax in the right  base region which appear stable. There is patchy consolidation in this area as well. The left lung remains clear. Heart size and pulmonary vascularity are within normal limits. No adenopathy. There remains a small non soft tissue air outside of the chest wall on the right inferiorly, stable.  IMPRESSION: Stable loculated right base pneumothorax. No change intact chest tube positions. Consolidation right lower lobe region stable. No new opacity. Left lung clear. No change in cardiac silhouette.   Electronically Signed   By: Lowella Grip M.D.   On: 05/07/2014 08:00  no sig changen in post-op changes. 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  Incentive spirometry Out of bed as soon as possible Oxygen as needed for O2 saturation greater than 92%  CXR in AM  CARDIOVASCULAR CVL none R rad art line 12/17 >out  A:  No acute issues  P:  Telemetry monitoring   RENAL A:   Acute on chronic kidney injury, initially improved P:   Monitor BMET and UOP Replace electrolytes as needed  GASTROINTESTINAL A:   Upper GI bleed secondary to duodenal ulcers, currently not actively bleeding  P:   Continue PPI Adv diet as tol   HEMATOLOGIC A:  Anemia, currently not actively bleeding   P:  Monitor CBC intermittently  Transfuse for HGB less than 7  INFECTIOUS A:   HIV   P:   Body fluid culture 12/17>  Anaerobic culture body fluid 12/17 > Ceftriaxone 12/11 through 12/15 Zosyn 12/17 > 12/20 (plan) Cefuroxime 12/17 Anti-retroviral therapy per home regimen  ENDOCRINE A:  Hypercholesterolemia   P:   Continue statin   NEUROLOGIC A:   Postoperative pain   P:   Fentanyl PCA   FAMILY  - Updates:   - Inter-disciplinary family meet or Palliative Care meeting due by:  12/24    NP SUMMARY No distress. Slowly improving. Ready for transfer to SDU. Think that his anterior CT is out of chest wall. If clamped then he has no airleak and the other tube does not leak. WIll defer to thoracic surgery. Still awaiting SDU bed. He needs to mobilize.   Erick Colace ACNP-BC [place] Pager # [phone] OR # [phone] if no answer   Attending Note:  I have examined patient, reviewed labs, studies and notes. I have discussed the case with Jerrye Bushy and I agree with the data and plans as amended above.   Baltazar Apo, MD, PhD 05/08/2014, 2:29 PM [facility] [phone] or if no answer [phone]

## 2014-05-08 NOTE — Progress Notes (Addendum)
      [address]       [place] [address]             [phone]       3 Days Post-Op Procedure(s) (LRB): VIDEO ASSISTED THORACOSCOPY (VATS)/DECORTICATION OF EMPYEMA (Right)  Subjective: Patient with cough-productive at times  Objective: Vital signs in last 24 hours: Temp:  [97.4 F (36.3 C)-98.8 F (37.1 C)] 98.6 F (37 C) (12/20 0800) Pulse Rate:  [84-115] 98 (12/20 0800) Cardiac Rhythm:  [-] Sinus tachycardia (12/20 0800) Resp:  [12-36] 17 (12/20 0800) BP: (77-114)/(40-75) 89/51 mmHg (12/20 0800) SpO2:  [86 %-99 %] 93 % (12/20 1024) FiO2 (%):  [28 %] 28 % (12/19 1244)     Intake/Output from previous day: 12/19 0701 - 12/20 0700 In: 2025 [P.O.:120; I.V.:1792.5; IV Piggyback:112.5] Out: 8280 [Urine:1370; Chest Tube:395]   Physical Exam:  Cardiovascular: RRR Pulmonary: Coarse on the right Abdomen: Soft, non tender, bowel sounds present. Extremities: Trace bilateral lower extremity edema. Wounds: Dressing is clean and dry.   Chest Tubes: to suction and there is an air leak that worsens with cough  Lab Results: CBC:  Recent Labs  05/06/14 0500  WBC 10.4  HGB 7.1*  HCT 21.9*  PLT 353   BMET:   Recent Labs  05/06/14 0500 05/07/14 0500  NA 139 140  K 5.0 4.5  CL 106 108  CO2 22 20  GLUCOSE 110* 125*  BUN 42* 41*  CREATININE 2.20* 2.06*  CALCIUM 7.8* 7.8*    PT/INR:  No results for input(s): LABPROT, INR in the last 72 hours. ABG:  INR: Will add last result for INR, ABG once components are confirmed Will add last 4 CBG results once components are confirmed  Assessment/Plan:  1. CV - SR in the 70's this am. 2.  Pulmonary - Chest tubes with 395 last 24 hours. Chest tubes are to suction this am.There is a persistent air leak that worsens with cough. He had a 2+ air leak after surgery. CXR shows likely decrease in right pneumothorax, left lung clear, pleural effusion on right. Hope to remove anterior chest tube soon. Mucinex  for cough. Encourage incentive spirometer. Await final pathology. 3. Anemia- H and H pending this am. Received 2 units of PRBC. Had previous GI bleed (duodenal ulcer, NSAID use). H. Pylori negative.No hematochezia.  4. Acute on chronic kidney disease-Creatinine down from 2.2 to 2.06  Manuel Lawhead MPA-C 05/08/2014,10:31 AM   05/08/2014

## 2014-05-09 ENCOUNTER — Encounter (HOSPITAL_COMMUNITY)
Admission: EM | Disposition: A | Discharge status: Skilled Nursing Facility | Payer: Self-pay | Source: Home / Self Care | Attending: Pulmonary Disease

## 2014-05-09 ENCOUNTER — Inpatient Hospital Stay (HOSPITAL_COMMUNITY): Payer: Commercial Managed Care - HMO | Admitting: Anesthesiology

## 2014-05-09 ENCOUNTER — Encounter (HOSPITAL_COMMUNITY): Payer: Self-pay | Admitting: General Surgery

## 2014-05-09 ENCOUNTER — Inpatient Hospital Stay (HOSPITAL_COMMUNITY): Payer: Commercial Managed Care - HMO

## 2014-05-09 DIAGNOSIS — J9601 Acute respiratory failure with hypoxia: Secondary | ICD-10-CM | POA: Insufficient documentation

## 2014-05-09 DIAGNOSIS — R579 Shock, unspecified: Secondary | ICD-10-CM | POA: Insufficient documentation

## 2014-05-09 HISTORY — PX: DUODENOTOMY: SHX5801

## 2014-05-09 HISTORY — PX: LAPAROTOMY: SHX154

## 2014-05-09 LAB — POCT I-STAT 7, (LYTES, BLD GAS, ICA,H+H)
Acid-base deficit: 7 mmol/L — ABNORMAL HIGH (ref 0.0–2.0)
Bicarbonate: 20 mEq/L (ref 20.0–24.0)
CALCIUM ION: 0.93 mmol/L — AB (ref 1.13–1.30)
HCT: 17 % — ABNORMAL LOW (ref 39.0–52.0)
HEMOGLOBIN: 5.8 g/dL — AB (ref 13.0–17.0)
O2 SAT: 100 %
PCO2 ART: 42.4 mmHg (ref 35.0–45.0)
PH ART: 7.273 — AB (ref 7.350–7.450)
PO2 ART: 391 mmHg — AB (ref 80.0–100.0)
Potassium: 4.3 mEq/L (ref 3.7–5.3)
Sodium: 142 mEq/L (ref 137–147)
TCO2: 21 mmol/L (ref 0–100)

## 2014-05-09 LAB — GLUCOSE, CAPILLARY
GLUCOSE-CAPILLARY: 218 mg/dL — AB (ref 70–99)
Glucose-Capillary: 117 mg/dL — ABNORMAL HIGH (ref 70–99)
Glucose-Capillary: 120 mg/dL — ABNORMAL HIGH (ref 70–99)
Glucose-Capillary: 122 mg/dL — ABNORMAL HIGH (ref 70–99)

## 2014-05-09 LAB — CBC
HCT: 18.9 % — ABNORMAL LOW (ref 39.0–52.0)
HCT: 17.8 % — ABNORMAL LOW (ref 39.0–52.0)
HCT: 26.9 % — ABNORMAL LOW (ref 39.0–52.0)
HCT: 18 % — ABNORMAL LOW (ref 39.0–52.0)
HEMATOCRIT: 20.9 % — AB (ref 39.0–52.0)
HEMATOCRIT: 36.9 % — AB (ref 39.0–52.0)
HEMOGLOBIN: 6 g/dL — AB (ref 13.0–17.0)
HEMOGLOBIN: 6.1 g/dL — AB (ref 13.0–17.0)
Hemoglobin: 6.2 g/dL — CL (ref 13.0–17.0)
Hemoglobin: 9 g/dL — ABNORMAL LOW (ref 13.0–17.0)
Hemoglobin: 7.2 g/dL — ABNORMAL LOW (ref 13.0–17.0)
Hemoglobin: 12.9 g/dL — ABNORMAL LOW (ref 13.0–17.0)
MCH: 30.3 pg (ref 26.0–34.0)
MCH: 30.4 pg (ref 26.0–34.0)
MCH: 30.8 pg (ref 26.0–34.0)
MCH: 31.7 pg (ref 26.0–34.0)
MCH: 34.6 pg — AB (ref 26.0–34.0)
MCH: 36.1 pg — ABNORMAL HIGH (ref 26.0–34.0)
MCHC: 32.8 g/dL (ref 30.0–36.0)
MCHC: 33.5 g/dL (ref 30.0–36.0)
MCHC: 33.7 g/dL (ref 30.0–36.0)
MCHC: 33.9 g/dL (ref 30.0–36.0)
MCHC: 34.4 g/dL (ref 30.0–36.0)
MCHC: 35 g/dL (ref 30.0–36.0)
MCV: 105.6 fL — ABNORMAL HIGH (ref 78.0–100.0)
MCV: 107.2 fL — ABNORMAL HIGH (ref 78.0–100.0)
MCV: 94.7 fL (ref 78.0–100.0)
MCV: 90.9 fL (ref 78.0–100.0)
MCV: 87.8 fL (ref 78.0–100.0)
MCV: 86.8 fL (ref 78.0–100.0)
PLATELETS: 284 10*3/uL (ref 150–400)
Platelets: ADEQUATE 10*3/uL (ref 150–400)
Platelets: 244 10*3/uL (ref 150–400)
Platelets: 188 10*3/uL (ref 150–400)
Platelets: 90 10*3/uL — ABNORMAL LOW (ref 150–400)
Platelets: 101 10*3/uL — ABNORMAL LOW (ref 150–400)
RBC: 1.66 MIL/uL — ABNORMAL LOW (ref 4.22–5.81)
RBC: 1.79 MIL/uL — AB (ref 4.22–5.81)
RBC: 1.98 MIL/uL — AB (ref 4.22–5.81)
RBC: 2.38 MIL/uL — ABNORMAL LOW (ref 4.22–5.81)
RBC: 2.84 MIL/uL — ABNORMAL LOW (ref 4.22–5.81)
RBC: 4.25 MIL/uL (ref 4.22–5.81)
RDW: 20.9 % — ABNORMAL HIGH (ref 11.5–15.5)
RDW: 19.6 % — ABNORMAL HIGH (ref 11.5–15.5)
RDW: 15.6 % — AB (ref 11.5–15.5)
RDW: 14.5 % (ref 11.5–15.5)
WBC: 6.7 10*3/uL (ref 4.0–10.5)
WBC: 6 10*3/uL (ref 4.0–10.5)
WBC: 9.4 10*3/uL (ref 4.0–10.5)
WBC: 7 10*3/uL (ref 4.0–10.5)
WBC: 5.1 10*3/uL (ref 4.0–10.5)
WBC: 5.7 10*3/uL (ref 4.0–10.5)

## 2014-05-09 LAB — COMPREHENSIVE METABOLIC PANEL
ALK PHOS: 50 U/L (ref 39–117)
ALT: 10 U/L (ref 0–53)
ANION GAP: 9 (ref 5–15)
AST: 16 U/L (ref 0–37)
Albumin: 1 g/dL — ABNORMAL LOW (ref 3.5–5.2)
BUN: 46 mg/dL — AB (ref 6–23)
CO2: 21 mEq/L (ref 19–32)
Calcium: 7.7 mg/dL — ABNORMAL LOW (ref 8.4–10.5)
Chloride: 111 mEq/L (ref 96–112)
Creatinine, Ser: 2.55 mg/dL — ABNORMAL HIGH (ref 0.50–1.35)
GFR calc Af Amer: 29 mL/min — ABNORMAL LOW (ref >90)
GFR calc non Af Amer: 25 mL/min — ABNORMAL LOW (ref >90)
GLUCOSE: 127 mg/dL — AB (ref 70–99)
POTASSIUM: 4.1 meq/L (ref 3.7–5.3)
Sodium: 141 mEq/L (ref 137–147)
TOTAL PROTEIN: 4 g/dL — AB (ref 6.0–8.3)

## 2014-05-09 LAB — BLOOD GAS, ARTERIAL
ACID-BASE DEFICIT: 14.1 mmol/L — AB (ref 0.0–2.0)
BICARBONATE: 12.9 meq/L — AB (ref 20.0–24.0)
Drawn by: 23588
FIO2: 1 %
LHR: 14 {breaths}/min
MECHVT: 550 mL
O2 Saturation: 99.4 %
PEEP: 5 cmH2O
PO2 ART: 382 mmHg — AB (ref 80.0–100.0)
Patient temperature: 98.6
TCO2: 14 mmol/L (ref 0–100)
pCO2 arterial: 37.7 mmHg (ref 35.0–45.0)
pH, Arterial: 7.16 — CL (ref 7.350–7.450)

## 2014-05-09 LAB — PREPARE RBC (CROSSMATCH)

## 2014-05-09 LAB — BASIC METABOLIC PANEL
ANION GAP: 12 (ref 5–15)
BUN: 39 mg/dL — ABNORMAL HIGH (ref 6–23)
CALCIUM: 6.4 mg/dL — AB (ref 8.4–10.5)
CO2: 17 meq/L — AB (ref 19–32)
CREATININE: 1.91 mg/dL — AB (ref 0.50–1.35)
Chloride: 111 mEq/L (ref 96–112)
GFR calc Af Amer: 40 mL/min — ABNORMAL LOW (ref >90)
GFR, EST NON AFRICAN AMERICAN: 35 mL/min — AB (ref >90)
GLUCOSE: 265 mg/dL — AB (ref 70–99)
Potassium: 4.3 mEq/L (ref 3.7–5.3)
Sodium: 140 mEq/L (ref 137–147)

## 2014-05-09 LAB — APTT: aPTT: 30 seconds (ref 24–37)

## 2014-05-09 LAB — PROTIME-INR
INR: 1.28 (ref 0.00–1.49)
Prothrombin Time: 16.1 seconds — ABNORMAL HIGH (ref 11.6–15.2)

## 2014-05-09 SURGERY — LAPAROTOMY, EXPLORATORY
Anesthesia: General | Site: Abdomen

## 2014-05-09 MED ORDER — ETOMIDATE 2 MG/ML IV SOLN
20.0000 mg | Freq: Once | INTRAVENOUS | Status: AC
  Administered 2014-05-09: 20 mg via INTRAVENOUS

## 2014-05-09 MED ORDER — SODIUM CHLORIDE 0.9 % IV SOLN
80.0000 mg | Freq: Once | INTRAVENOUS | Status: AC
  Administered 2014-05-09: 80 mg via INTRAVENOUS
  Filled 2014-05-09: qty 80

## 2014-05-09 MED ORDER — NOREPINEPHRINE BITARTRATE 1 MG/ML IV SOLN
0.0000 ug/min | INTRAVENOUS | Status: DC
  Administered 2014-05-09: 20 ug/min via INTRAVENOUS
  Filled 2014-05-09 (×2): qty 16

## 2014-05-09 MED ORDER — EVICEL 5 ML EX KIT
PACK | CUTANEOUS | Status: AC
  Filled 2014-05-09: qty 1

## 2014-05-09 MED ORDER — SODIUM BICARBONATE 8.4 % IV SOLN
100.0000 meq | Freq: Once | INTRAVENOUS | Status: AC
  Administered 2014-05-09: 100 meq via INTRAVENOUS
  Filled 2014-05-09: qty 100

## 2014-05-09 MED ORDER — FENTANYL CITRATE 0.05 MG/ML IJ SOLN
INTRAMUSCULAR | Status: DC | PRN
  Administered 2014-05-09 (×2): 50 ug via INTRAVENOUS
  Administered 2014-05-09: 100 ug via INTRAVENOUS
  Administered 2014-05-09: 50 ug via INTRAVENOUS

## 2014-05-09 MED ORDER — SODIUM BICARBONATE 8.4 % IV SOLN
INTRAVENOUS | Status: AC
  Filled 2014-05-09: qty 50

## 2014-05-09 MED ORDER — VECURONIUM BROMIDE 10 MG IV SOLR
INTRAVENOUS | Status: DC | PRN
  Administered 2014-05-09: 5 mg via INTRAVENOUS

## 2014-05-09 MED ORDER — SODIUM CHLORIDE 0.9 % IV SOLN: Freq: Once | INTRAVENOUS | Status: DC

## 2014-05-09 MED ORDER — PANTOPRAZOLE SODIUM 40 MG IV SOLR
8.0000 mg/h | INTRAVENOUS | Status: AC
  Administered 2014-05-09 – 2014-05-12 (×7): 8 mg/h via INTRAVENOUS
  Filled 2014-05-09 (×14): qty 80

## 2014-05-09 MED ORDER — ONDANSETRON HCL 4 MG/2ML IJ SOLN: 4.0000 mg | Freq: Four times a day (QID) | INTRAMUSCULAR | Status: DC | PRN

## 2014-05-09 MED ORDER — SODIUM CHLORIDE 0.9 % IV SOLN
INTRAVENOUS | Status: DC
  Administered 2014-05-09 (×2): via INTRAVENOUS

## 2014-05-09 MED ORDER — SODIUM CHLORIDE 0.9 % IV SOLN: 750.0000 mL | INTRAVENOUS | Status: DC | PRN

## 2014-05-09 MED ORDER — EVICEL 2 ML EX KIT
PACK | CUTANEOUS | Status: AC
  Filled 2014-05-09: qty 1

## 2014-05-09 MED ORDER — PANTOPRAZOLE SODIUM 40 MG IV SOLR
40.0000 mg | Freq: Two times a day (BID) | INTRAVENOUS | Status: DC
  Administered 2014-05-09: 40 mg via INTRAVENOUS
  Filled 2014-05-09: qty 40

## 2014-05-09 MED ORDER — NOREPINEPHRINE BITARTRATE 1 MG/ML IV SOLN
0.0000 ug/min | INTRAVENOUS | Status: DC
  Administered 2014-05-09: 35 ug/min via INTRAVENOUS
  Filled 2014-05-09 (×2): qty 4

## 2014-05-09 MED ORDER — SODIUM CHLORIDE 0.9 % IV BOLUS (SEPSIS)
1000.0000 mL | Freq: Once | INTRAVENOUS | Status: AC
  Administered 2014-05-09: 1000 mL via INTRAVENOUS

## 2014-05-09 MED ORDER — ROCURONIUM BROMIDE 100 MG/10ML IV SOLN
INTRAVENOUS | Status: DC | PRN
  Administered 2014-05-09: 50 mg via INTRAVENOUS

## 2014-05-09 MED ORDER — MIDAZOLAM HCL 2 MG/2ML IJ SOLN
INTRAMUSCULAR | Status: AC
  Filled 2014-05-09: qty 2

## 2014-05-09 MED ORDER — ALBUMIN HUMAN 5 % IV SOLN
12.5000 g | Freq: Once | INTRAVENOUS | Status: AC
  Administered 2014-05-09: 12.5 g via INTRAVENOUS
  Filled 2014-05-09: qty 250

## 2014-05-09 MED ORDER — INSULIN ASPART 100 UNIT/ML ~~LOC~~ SOLN
2.0000 [IU] | SUBCUTANEOUS | Status: DC
  Administered 2014-05-09: 6 [IU] via SUBCUTANEOUS
  Administered 2014-05-10: 4 [IU] via SUBCUTANEOUS

## 2014-05-09 MED ORDER — NOREPINEPHRINE BITARTRATE 1 MG/ML IV SOLN
0.0000 ug/min | INTRAVENOUS | Status: AC
  Administered 2014-05-09: 35 ug/min via INTRAVENOUS
  Filled 2014-05-09: qty 4

## 2014-05-09 MED ORDER — SODIUM CHLORIDE 0.9 % IV SOLN
Freq: Once | INTRAVENOUS | Status: AC
  Administered 2014-05-09: 05:00:00 via INTRAVENOUS

## 2014-05-09 MED ORDER — HYDROMORPHONE HCL 1 MG/ML IJ SOLN: 0.2500 mg | INTRAMUSCULAR | Status: DC | PRN

## 2014-05-09 MED ORDER — 0.9 % SODIUM CHLORIDE (POUR BTL) OPTIME
TOPICAL | Status: DC | PRN
  Administered 2014-05-09 (×3): 1000 mL

## 2014-05-09 MED ORDER — CEFOTETAN DISODIUM 1 G IJ SOLR
1.0000 g | Freq: Once | INTRAMUSCULAR | Status: AC
  Administered 2014-05-09: 1 g via INTRAVENOUS
  Filled 2014-05-09: qty 1

## 2014-05-09 MED ORDER — ARTIFICIAL TEARS OP OINT
TOPICAL_OINTMENT | OPHTHALMIC | Status: DC | PRN
  Administered 2014-05-09: 1 via OPHTHALMIC

## 2014-05-09 MED ORDER — SODIUM CHLORIDE 0.9 % IV BOLUS (SEPSIS)
250.0000 mL | Freq: Once | INTRAVENOUS | Status: AC
  Administered 2014-05-09: 250 mL via INTRAVENOUS

## 2014-05-09 MED ORDER — PROPOFOL 10 MG/ML IV BOLUS
INTRAVENOUS | Status: AC
  Filled 2014-05-09: qty 20

## 2014-05-09 MED ORDER — MIDAZOLAM HCL 2 MG/2ML IJ SOLN
INTRAMUSCULAR | Status: AC
  Filled 2014-05-09: qty 4

## 2014-05-09 MED ORDER — BOOST PO LIQD
237.0000 mL | Freq: Three times a day (TID) | ORAL | Status: DC
  Filled 2014-05-09 (×5): qty 237

## 2014-05-09 MED ORDER — NOREPINEPHRINE BITARTRATE 1 MG/ML IV SOLN
4000.0000 ug | INTRAVENOUS | Status: DC | PRN
  Administered 2014-05-09: 25 ug/min via INTRAVENOUS
  Administered 2014-05-09: 35 ug/min via INTRAVENOUS

## 2014-05-09 MED ORDER — FENTANYL CITRATE 0.05 MG/ML IJ SOLN
INTRAMUSCULAR | Status: AC
  Filled 2014-05-09: qty 5

## 2014-05-09 MED ORDER — PROMETHAZINE HCL 25 MG/ML IJ SOLN: 6.2500 mg | INTRAMUSCULAR | Status: DC | PRN

## 2014-05-09 MED ORDER — EVICEL 5 ML EX KIT
PACK | CUTANEOUS | Status: DC | PRN
  Administered 2014-05-09: 5 mL

## 2014-05-09 MED ORDER — PHENYLEPHRINE HCL 10 MG/ML IJ SOLN
10.0000 mg | INTRAVENOUS | Status: DC | PRN
  Administered 2014-05-09: 10 ug/min via INTRAVENOUS

## 2014-05-09 MED ORDER — MIDAZOLAM HCL 2 MG/2ML IJ SOLN
1.0000 mg | Freq: Once | INTRAMUSCULAR | Status: AC
  Administered 2014-05-09: 1 mg via INTRAVENOUS

## 2014-05-09 MED ORDER — FENTANYL CITRATE 0.05 MG/ML IJ SOLN
100.0000 ug | Freq: Once | INTRAMUSCULAR | Status: AC
  Administered 2014-05-09: 100 ug via INTRAVENOUS

## 2014-05-09 MED ORDER — LACTATED RINGERS IV SOLN
INTRAVENOUS | Status: DC | PRN
  Administered 2014-05-09 (×2): via INTRAVENOUS

## 2014-05-09 MED ORDER — MIDAZOLAM HCL 5 MG/5ML IJ SOLN
INTRAMUSCULAR | Status: DC | PRN
  Administered 2014-05-09 (×2): 2 mg via INTRAVENOUS

## 2014-05-09 MED ORDER — SODIUM BICARBONATE 8.4 % IV SOLN
INTRAVENOUS | Status: AC
  Administered 2014-05-09: 100 meq via INTRAVENOUS
  Filled 2014-05-09: qty 50

## 2014-05-09 MED ORDER — PHENYLEPHRINE HCL 10 MG/ML IJ SOLN
0.0000 ug/min | INTRAVENOUS | Status: DC
  Administered 2014-05-09: 10 ug/min via INTRAVENOUS
  Filled 2014-05-09 (×3): qty 1

## 2014-05-09 MED ORDER — PANTOPRAZOLE SODIUM 40 MG IV SOLR: 40.0000 mg | Freq: Two times a day (BID) | INTRAVENOUS | Status: DC

## 2014-05-09 MED ORDER — VASOPRESSIN 20 UNIT/ML IV SOLN
0.0300 [IU]/min | INTRAVENOUS | Status: DC
  Administered 2014-05-09 – 2014-05-10 (×3): 0.03 [IU]/min via INTRAVENOUS
  Administered 2014-05-10: 0.2 [IU]/min via INTRAVENOUS
  Filled 2014-05-09 (×4): qty 2

## 2014-05-09 MED ORDER — FENTANYL CITRATE 0.05 MG/ML IJ SOLN
INTRAMUSCULAR | Status: AC
  Filled 2014-05-09: qty 4

## 2014-05-09 MED ORDER — ONDANSETRON HCL 4 MG/2ML IJ SOLN: 4.0000 mg | Freq: Four times a day (QID) | INTRAMUSCULAR | Status: DC

## 2014-05-09 SURGICAL SUPPLY — 59 items
BLADE SURG ROTATE 9660 (MISCELLANEOUS) IMPLANT
BNDG GAUZE ELAST 4 BULKY (GAUZE/BANDAGES/DRESSINGS) ×3 IMPLANT
CANISTER SUCTION 2500CC (MISCELLANEOUS) ×3 IMPLANT
CATH ROBINSON RED A/P 22FR (CATHETERS) ×3 IMPLANT
CHLORAPREP W/TINT 26ML (MISCELLANEOUS) ×3 IMPLANT
CLIP TI LARGE 6 (CLIP) ×6 IMPLANT
CLIP TI MEDIUM 6 (CLIP) ×3 IMPLANT
COVER MAYO STAND STRL (DRAPES) IMPLANT
COVER SURGICAL LIGHT HANDLE (MISCELLANEOUS) ×3 IMPLANT
DRAIN CHANNEL 19F RND (DRAIN) ×3 IMPLANT
DRAPE LAPAROSCOPIC ABDOMINAL (DRAPES) ×3 IMPLANT
DRAPE PROXIMA HALF (DRAPES) IMPLANT
DRAPE UTILITY XL STRL (DRAPES) ×6 IMPLANT
DRAPE WARM FLUID 44X44 (DRAPE) ×3 IMPLANT
DRSG OPSITE POSTOP 4X10 (GAUZE/BANDAGES/DRESSINGS) IMPLANT
DRSG OPSITE POSTOP 4X8 (GAUZE/BANDAGES/DRESSINGS) IMPLANT
DRSG PAD ABDOMINAL 8X10 ST (GAUZE/BANDAGES/DRESSINGS) ×3 IMPLANT
ELECT BLADE 6.5 EXT (BLADE) ×3 IMPLANT
ELECT CAUTERY BLADE 6.4 (BLADE) IMPLANT
ELECT REM PT RETURN 9FT ADLT (ELECTROSURGICAL) ×3
ELECTRODE REM PT RTRN 9FT ADLT (ELECTROSURGICAL) ×1 IMPLANT
EVACUATOR SILICONE 100CC (DRAIN) ×3 IMPLANT
GLOVE BIO SURGEON STRL SZ8 (GLOVE) ×6 IMPLANT
GLOVE BIOGEL PI IND STRL 6.5 (GLOVE) ×1 IMPLANT
GLOVE BIOGEL PI IND STRL 7.0 (GLOVE) ×1 IMPLANT
GLOVE BIOGEL PI IND STRL 8 (GLOVE) ×1 IMPLANT
GLOVE BIOGEL PI INDICATOR 6.5 (GLOVE) ×2
GLOVE BIOGEL PI INDICATOR 7.0 (GLOVE) ×2
GLOVE BIOGEL PI INDICATOR 8 (GLOVE) ×2
GLOVE ECLIPSE 7.5 STRL STRAW (GLOVE) ×3 IMPLANT
GOWN STRL REUS W/ TWL LRG LVL3 (GOWN DISPOSABLE) ×3 IMPLANT
GOWN STRL REUS W/TWL LRG LVL3 (GOWN DISPOSABLE) ×6
KIT BASIN OR (CUSTOM PROCEDURE TRAY) ×3 IMPLANT
KIT ROOM TURNOVER OR (KITS) ×3 IMPLANT
LIGASURE IMPACT 36 18CM CVD LR (INSTRUMENTS) IMPLANT
NS IRRIG 1000ML POUR BTL (IV SOLUTION) ×6 IMPLANT
PACK GENERAL/GYN (CUSTOM PROCEDURE TRAY) ×3 IMPLANT
PAD ARMBOARD 7.5X6 YLW CONV (MISCELLANEOUS) ×3 IMPLANT
PENCIL BUTTON HOLSTER BLD 10FT (ELECTRODE) IMPLANT
SEPRAFILM PROCEDURAL PACK 3X5 (MISCELLANEOUS) IMPLANT
SPECIMEN JAR LARGE (MISCELLANEOUS) IMPLANT
SPONGE GAUZE 4X4 12PLY STER LF (GAUZE/BANDAGES/DRESSINGS) ×3 IMPLANT
SPONGE LAP 18X18 X RAY DECT (DISPOSABLE) ×3 IMPLANT
STAPLER VISISTAT 35W (STAPLE) ×6 IMPLANT
SUCTION POOLE TIP (SUCTIONS) IMPLANT
SUT ETHILON 2 0 FS 18 (SUTURE) ×3 IMPLANT
SUT NOVA 1 T20/GS 25DT (SUTURE) IMPLANT
SUT PDS AB 1 CTX 36 (SUTURE) ×6 IMPLANT
SUT PDS AB 1 TP1 96 (SUTURE) IMPLANT
SUT SILK 2 0 SH CR/8 (SUTURE) ×12 IMPLANT
SUT SILK 2 0 TIES 10X30 (SUTURE) ×3 IMPLANT
SUT SILK 3 0 SH CR/8 (SUTURE) IMPLANT
SUT SILK 3 0 TIES 10X30 (SUTURE) ×3 IMPLANT
TAPE CLOTH SURG 6X10 WHT LF (GAUZE/BANDAGES/DRESSINGS) ×3 IMPLANT
TOWEL OR 17X26 10 PK STRL BLUE (TOWEL DISPOSABLE) ×3 IMPLANT
TRAY FOLEY CATH 16FRSI W/METER (SET/KITS/TRAYS/PACK) IMPLANT
TUBE CONNECTING 12'X1/4 (SUCTIONS)
TUBE CONNECTING 12X1/4 (SUCTIONS) IMPLANT
YANKAUER SUCT BULB TIP NO VENT (SUCTIONS) IMPLANT

## 2014-05-09 NOTE — Progress Notes (Signed)
[facility] Progress Note Patient Name: Lonney Revak DOB: [birth year] MRN: [id number]   Date of Service  05/09/2014  HPI/Events of Note  Ca 6.4 but albumin 1.0    eICU Interventions  No treatment      Intervention Category Major Interventions: Electrolyte abnormality - evaluation and management  Asencion Noble 05/09/2014, 9:45 PM

## 2014-05-09 NOTE — Anesthesia Postprocedure Evaluation (Signed)
  Anesthesia Post-op Note  Patient: [name] [name]  Procedure(s) Performed: Procedure(s): EXPLORATORY LAPAROTOMY (N/A) DUODENOTOMY with Primary ligation Bleeding Duodenal Ulcer (N/A)  Patient Location: ICU  Anesthesia Type:General  Level of Consciousness: Patient remains intubated per anesthesia plan  Airway and Oxygen Therapy: Patient remains intubated per anesthesia plan  Post-op Pain: none  Post-op Assessment: Post-op Vital signs reviewed  Post-op Vital Signs: Reviewed  Last Vitals:  Filed Vitals:   05/09/14 1907  BP:   Pulse: 78  Temp:   Resp: 14    Complications: No apparent anesthesia complications

## 2014-05-09 NOTE — Procedures (Signed)
Central Venous Catheter Insertion Procedure Note [name] [name] [id number] 04/16/1946  Procedure: Insertion of Central Venous Catheter Indications: Assessment of intravascular volume, Drug and/or fluid administration and Frequent blood sampling  Procedure Details Consent: Risks of procedure as well as the alternatives and risks of each were explained to the (patient/caregiver).  Consent for procedure obtained. Time Out: Verified patient identification, verified procedure, site/side was marked, verified correct patient position, special equipment/implants available, medications/allergies/relevent history reviewed, required imaging and test results available.  Performed  Maximum sterile technique was used including antiseptics, cap, gloves, gown, hand hygiene, mask and sheet. Skin prep: Chlorhexidine; local anesthetic administered A antimicrobial bonded/coated triple lumen catheter was placed in the right internal jugular vein using the Seldinger technique.  Evaluation Blood flow good Complications: No apparent complications Patient did tolerate procedure well. Chest X-ray ordered to verify placement.  CXR: pending.  BABCOCK,PETE 05/09/2014, 4:53 PM  Supervised by me   Ultrasound used for site verification, live visualisation of needle entry & guidewire prior to dilation  Yemaya Barnier V.

## 2014-05-09 NOTE — Progress Notes (Signed)
CRITICAL VALUE ALERT  Critical value received:  Calcium 6.4   Date of notification:  12/21  Time of notification: 2140  Critical value read back: Yes   Nurse who received alert:  Polly Cobia RN   MD notified (1st page):  Joya Gaskins   Time of first page:  2140  MD notified (2nd page):  Time of second page:  Responding MD: Joya Gaskins   Time MD responded: 2141

## 2014-05-09 NOTE — Progress Notes (Signed)
Patient returned from OR and placed back on ventilator on full support without any complications.  RT will continue to monitor.

## 2014-05-09 NOTE — Transfer of Care (Signed)
Immediate Anesthesia Transfer of Care Note  Patient: [name] [name]  Procedure(s) Performed: Procedure(s): EXPLORATORY LAPAROTOMY (N/A) DUODENOTOMY with Primary ligation Bleeding Duodenal Ulcer (N/A)  Patient Location: ICU  Anesthesia Type:General  Level of Consciousness: sedated and Patient remains intubated per anesthesia plan  Airway & Oxygen Therapy: Patient remains intubated per anesthesia plan and Patient placed on Ventilator (see vital sign flow sheet for setting)  Post-op Assessment: Report given to PACU RN and Post -op Vital signs reviewed and stable  Post vital signs: Reviewed and stable, remains on Vasopressin and Levophed gtt's. Total of 4 PRBC's and 2 FFP given during case.  CBC pending.   Complications: No apparent anesthesia complications

## 2014-05-09 NOTE — Progress Notes (Signed)
Patient ID: [name] [name], male   DOB: 1946-05-07, 68 y.o.   MRN: [id number] [facility] Gastroenterology Progress Note  [name] [name] 68 y.o. 1945-09-09   Subjective: Recurrent GI bleeding with large amount of red blood from rectum with clots. S/P intubation. On pressors  Objective: Vital signs in last 24 hours: Filed Vitals:   05/09/14 1531  BP: 77/49  Pulse: 106  Temp: 98.6 F (37 C)  Resp: 19    Physical Exam: Gen: intubated, sedated, pale CV: RRR Chest: coarse breath sounds Abd: soft, nondistended, +BS  Lab Results:  Recent Labs  05/07/14 0500 05/09/14 0325  NA 140 141  K 4.5 4.1  CL 108 111  CO2 20 21  GLUCOSE 125* 127*  BUN 41* 46*  CREATININE 2.06* 2.55*  CALCIUM 7.8* 7.7*    Recent Labs  05/07/14 0500 05/09/14 0325  AST 21 16  ALT 15 10  ALKPHOS 59 50  BILITOT 0.5 <0.2*  PROT 5.1* 4.0*  ALBUMIN 1.6* 1.0*    Recent Labs  05/09/14 1100 05/09/14 1435  WBC 9.4 7.0  HGB 9.0* 6.1*  HCT 26.9* 18.0*  MCV 94.7 90.9  PLT 244 188   No results for input(s): LABPROT, INR in the last 72 hours.    Assessment/Plan: GI bleed likely from duodenal ulcer noted on EGD last week. Doubt bleeding can be stopped endoscopically based on the size/appearance of the ulcer noted last week. Changed to Protonix drip. Recommend IR vs. Surgery as next step and surgery evaluating patient now. Volume resuscitate with blood products.   [place] C. 05/09/2014, 3:51 PM

## 2014-05-09 NOTE — Procedures (Signed)
Intubation Procedure Note [name] [name] [id number] 05-01-1946  Procedure: Intubation Indications: Respiratory insufficiency  Procedure Details Consent: Risks of procedure as well as the alternatives and risks of each were explained to the (patient/caregiver).  Consent for procedure obtained. Time Out: Verified patient identification, verified procedure, site/side was marked, verified correct patient position, special equipment/implants available, medications/allergies/relevent history reviewed, required imaging and test results available.  Performed  Maximum sterile technique was used including antiseptics, cap, gloves, hand hygiene and mask.  MAC and 3    Evaluation Hemodynamic Status: BP low throughout; O2 sats: stable throughout Patient's Current Condition: stable Complications: No apparent complications Patient did tolerate procedure well. Chest X-ray ordered to verify placement.  CXR: pending.   BABCOCK,PETE 05/09/2014  Supervised by me Kara Mead V.

## 2014-05-09 NOTE — Anesthesia Preprocedure Evaluation (Addendum)
Anesthesia Evaluation    Reviewed: Allergy & Precautions, NPO status , Patient's Chart, lab work & pertinent test resultsPreop documentation limited or incomplete due to emergent nature of procedure.  Airway Mallampati: Intubated       Dental   Pulmonary former smoker,  Respiratory failure: intubated         Cardiovascular  Unstable hemodynamics on pressors   Neuro/Psych PSYCHIATRIC DISORDERS Depression negative neurological ROS     GI/Hepatic Neg liver ROS,   Endo/Other  negative endocrine ROS  Renal/GU Renal InsufficiencyRenal disease     Musculoskeletal   Abdominal   Peds  Hematology  (+) anemia , HIV,   Anesthesia Other Findings   Reproductive/Obstetrics                            Anesthesia Physical Anesthesia Plan  ASA: IV and emergent  Anesthesia Plan: General   Post-op Pain Management:    Induction: Intravenous  Airway Management Planned: Oral ETT  Additional Equipment:   Intra-op Plan:   Post-operative Plan: Possible Post-op intubation/ventilation  Informed Consent:   Plan Discussed with:   Anesthesia Plan Comments:         Anesthesia Quick Evaluation

## 2014-05-09 NOTE — Progress Notes (Signed)
Wasted 15cc's fentanyl with English as a second language teacher in sink in med room.

## 2014-05-09 NOTE — Op Note (Addendum)
04/28/2014 - 05/09/2014  6:43 PM  PATIENT:  [name] [name]  68 y.o. male  PRE-OPERATIVE DIAGNOSIS:  Bleeding duodenal Ulcer  POST-OPERATIVE DIAGNOSIS:  Bleeding duodenal Ulcer  PROCEDURE:  Procedure(s): Emergent EXPLORATORY LAPAROTOMY (N/A) DUODENOTOMY with Primary ligation Bleeding Duodenal Ulcer (N/A) JP drain placement  SURGEON:  Surgeon(s) and Role:    * Ralene Ok, MD - Primary    * Gayland Curry, MD - Assisting  ANESTHESIA:   general  EBL:  Total I/O In: 4018.3 [I.V.:83.9; Blood:3934.3] Out: 725 [Urine:425; Other:300]  BLOOD ADMINISTERED: 4 units of PRBCs, 2 units of FFP  DRAINS: (41 [place]) Jackson-Pratt drain(s) with closed bulb suction in the overlying the duodenal repair   LOCAL MEDICATIONS USED:  NONE  SPECIMEN:  No Specimen  DISPOSITION OF SPECIMEN:  N/A  COUNTS:  YES  TOURNIQUET:  * No tourniquets in log *  DICTATION: .Dragon Dictation The patient was emergently consented and taken back to the operating room with bilateral SCDs in place.  The patient was prepped and draped in the usual sterile fashion. After appropriate antibiotic  Were confirmed a timeout was called all facts verified.  A #10 blade was used to make a midline upper laparotomy. Electrocautery was used to maintain hemostasis and dissection was taken down to the anterior fascia. This was incised. The peritoneum was entered bluntly. At this time the stomach was grasped and retracted caudad. The pylorus was identified. A longitudinal pyloromyotomy was then made. There was a large amount of liquid blood. The blood was evacuated. The duodenotomy was then extended distally. At this time there was a small 1 cm ulcer that was noted and posterior wall of the duodenum. This was not seen to be involved in the bleeding. Just distal to this approximately 2 cm in descending ulcer was seen with an actively bleeding vessel. A 2-0 silk was used in figure-of-eight fashion to help ligate this. Once this was  ligated the bleeding ceased.  This time the stomach was evacuated of the clot. The NG tube was in position just proximal to the pyloromyotomy. At this time the pyloromyotomy and duodenotomy were then closed in a transverse fashion. This was done with interrupted 2-0 silk's. Upon completion of closure of the duodenotomy there appeared to be some bilious drainage coming from the midportion. It appeared that the midportion of the closure had pulled away from the distal portion of the duodenum. At this time to transverse stitches were then placed using 2-0 silk's. There was irrigated out and a lap pad was placed over this area. After waiting several minutes the left eye was removed and there is seen to be no bilious spillage. The abdomen wasn't irrigated out with sterile saline.  At this time 0 silk was used and placed over the primary closure site. A tongue of omentum was placed over this. A 19 [place] Blake drain was then placed over this area. This is brought out through the right upper quadrant via a stab incision. This was secured using a 2-0 nylon.  At this time the midline was reapproximated using #1 PDS in a running fashion. The skin was left open and packed with saline soaked Kerlix. The patient remained intubated and was taken to the ICU in guarded condition.  PLAN OF CARE: Admitted as inpatient  PATIENT DISPOSITION:  PACU - hemodynamically stable.   Delay start of Pharmacological VTE agent (>24hrs) due to surgical blood loss or risk of bleeding: not applicable

## 2014-05-09 NOTE — Consult Note (Signed)
[name] [name] 19-Oct-1945  [id number].   Requesting MD: Dr. Merton Border Chief Complaint/Reason for Consult: UGI bleed HPI: This is a 68 yo white male with HIV who has lung cancer who had an upper endo last week that showed a large duodenal ulcer that wasn't bleeding at the time.  He had a large lung cancer that he had a VATS on last week.  Today he started having massive amounts of blood and blood clot per rectum.  He dropped his hgb to 6 and his pressure as well.  He is now on levo and vaso.  We have been asked to see him for emergent surgical intervention.  ROS : unable, intubated  Family History  Problem Relation Age of Onset  . Heart failure Mother   . Pneumonia Father     Past Medical History  Diagnosis Date  . HIV (human immunodeficiency virus infection) dx'd 2010  . High cholesterol   . Bleeding per rectum   . Depression   . Chronic renal disease   . Peripheral neuropathy   . Pleural effusion on right     Past Surgical History  Procedure Laterality Date  . Tonsillectomy    . Thoracentesis Right 04/25/2014  . Esophagogastroduodenoscopy N/A 05/01/2014    Procedure: ESOPHAGOGASTRODUODENOSCOPY (EGD);  Surgeon: [name], MD;  Location: [facility] ENDOSCOPY;  Service: Endoscopy;  Laterality: N/A;  . Flexible sigmoidoscopy N/A 05/01/2014    Procedure: FLEXIBLE SIGMOIDOSCOPY;  Surgeon: [name], MD;  Location: [facility] ENDOSCOPY;  Service: Endoscopy;  Laterality: N/A;  . Video assisted thoracoscopy (vats)/decortication Right 05/05/2014    Procedure: VIDEO ASSISTED THORACOSCOPY (VATS)/DECORTICATION OF EMPYEMA;  Surgeon: Ivin Poot, MD;  Location: [facility] OR;  Service: Thoracic;  Laterality: Right;    Social History:  reports that he quit smoking about 2 weeks ago. His smoking use included Cigarettes. He has a 46 pack-year smoking history. He has never used smokeless tobacco. He reports that he drinks about 0.5 oz of alcohol per week. He reports that he uses illicit  drugs.  Allergies: No Known Allergies  Medications Prior to Admission  Medication Sig Dispense Refill  . atazanavir-cobicistat (EVOTAZ) 300-150 MG per tablet Take 1 tablet by mouth daily. Swallow whole. Do NOT crush, cut or chew tablet. Take with food. 30 tablet 11  . buPROPion (WELLBUTRIN XL) 150 MG 24 hr tablet Take 150 mg by mouth daily.    . Cholecalciferol (VITAMIN D3) 2000 UNITS capsule Take 2,000 Units by mouth daily.    Marland Kitchen HYDROcodone-acetaminophen (NORCO) 5-325 MG per tablet Take 1 tablet by mouth every 6 (six) hours as needed for moderate pain. 30 tablet 0  . lamiVUDine (EPIVIR) 150 MG tablet Take 1 tablet (150 mg total) by mouth daily. 30 tablet 11  . Multiple Vitamin (MULTIVITAMIN WITH MINERALS) TABS tablet Take 1 tablet by mouth daily.    . rosuvastatin (CRESTOR) 5 MG tablet Take 1 tablet (5 mg total) by mouth daily. 30 tablet 11  . zidovudine (RETROVIR) 300 MG tablet Take 1 tablet (300 mg total) by mouth 2 (two) times daily. 60 tablet 11  . Multiple Vitamins-Minerals (CENTRUM SILVER PO) Take 1 tablet by mouth daily.        Blood pressure 106/66, pulse 109, temperature 98.6 F (37 C), temperature source Oral, resp. rate 25, height 5' 6"  (1.676 m), weight 125 lb 7.1 oz (56.9 kg), SpO2 100 %. Physical Exam: General: critically ill white male who is laying in bed intubated HEENT: head is normocephalic, atraumatic.  Sclera are noninjected. Ears and nose without any masses or lesions.  Mouth with ETT in place Heart: tachycardic  Normal s1,s2. No obvious murmurs, gallops, or rubs noted.  Palpable radial and pedal pulses bilaterally Lungs: CTAB, no wheezes, rhonchi, or rales noted.  Respiratory effort nonlabored, right sided chest tube in place Abd: soft, ND, +BS, no masses, hernias, or organomegaly, blood clot noted in bed Skin: warm, but feet are cold and dry with no masses, lesions, or rashes     Results for orders placed or performed during the hospital encounter of 04/28/14  (from the past 48 hour(s))  Glucose, capillary     Status: Abnormal   Collection Time: 05/07/14  6:01 PM  Result Value Ref Range   Glucose-Capillary 102 (H) 70 - 99 mg/dL  Glucose, capillary     Status: Abnormal   Collection Time: 05/07/14 11:52 PM  Result Value Ref Range   Glucose-Capillary 109 (H) 70 - 99 mg/dL  Glucose, capillary     Status: Abnormal   Collection Time: 05/08/14  6:26 AM  Result Value Ref Range   Glucose-Capillary 108 (H) 70 - 99 mg/dL  Glucose, capillary     Status: Abnormal   Collection Time: 05/08/14 11:42 AM  Result Value Ref Range   Glucose-Capillary 137 (H) 70 - 99 mg/dL  Glucose, capillary     Status: Abnormal   Collection Time: 05/08/14  5:48 PM  Result Value Ref Range   Glucose-Capillary 131 (H) 70 - 99 mg/dL  Glucose, capillary     Status: Abnormal   Collection Time: 05/08/14 11:44 PM  Result Value Ref Range   Glucose-Capillary 120 (H) 70 - 99 mg/dL   Comment 1 Notify RN   CBC     Status: Abnormal   Collection Time: 05/09/14 12:30 AM  Result Value Ref Range   WBC 6.7 4.0 - 10.5 K/uL   RBC 1.79 (L) 4.22 - 5.81 MIL/uL   Hemoglobin 6.2 (LL) 13.0 - 17.0 g/dL    Comment: CRITICAL RESULT CALLED TO, READ BACK BY AND VERIFIED WITH: TILLMAN,S RN 05/09/2014 0154 JORDANS REPEATED TO VERIFY CRITICAL RESULT CALLED TO, READ BACK BY AND VERIFIED WITH: WOODS,T RN 2MIDWEST 05/09/2014 0154 JORDANS REPEATED TO VERIFY    HCT 18.9 (L) 39.0 - 52.0 %   MCV 105.6 (H) 78.0 - 100.0 fL   MCH 34.6 (H) 26.0 - 34.0 pg   MCHC 32.8 30.0 - 36.0 g/dL   RDW NOT CALCULATED 11.5 - 15.5 %   Platelets 284 150 - 400 K/uL  Type and screen     Status: None (Preliminary result)   Collection Time: 05/09/14  2:20 AM  Result Value Ref Range   ABO/RH(D) A POS    Antibody Screen NEG    Sample Expiration 05/12/2014    Unit Number [id number]    Blood Component Type RED CELLS,LR    Unit division 00    Status of Unit ISSUED    Transfusion Status OK TO TRANSFUSE    Crossmatch  Result Compatible    Unit Number [id number]    Blood Component Type RED CELLS,LR    Unit division 00    Status of Unit ISSUED    Transfusion Status OK TO TRANSFUSE    Crossmatch Result Compatible    Unit Number [id number]    Blood Component Type RED CELLS,LR    Unit division 00    Status of Unit ISSUED    Transfusion Status OK TO TRANSFUSE    Crossmatch Result Compatible  Unit Number [id number]    Blood Component Type RED CELLS,LR    Unit division 00    Status of Unit ALLOCATED    Transfusion Status OK TO TRANSFUSE    Crossmatch Result Compatible    Unit Number [id number]    Blood Component Type RED CELLS,LR    Unit division 00    Status of Unit ALLOCATED    Transfusion Status OK TO TRANSFUSE    Crossmatch Result Compatible    Unit Number [id number]    Blood Component Type RED CELLS,LR    Unit division 00    Status of Unit ISSUED    Transfusion Status OK TO TRANSFUSE    Crossmatch Result Compatible    Unit Number [id number]    Blood Component Type RED CELLS,LR    Unit division 00    Status of Unit ISSUED    Transfusion Status OK TO TRANSFUSE    Crossmatch Result Compatible    Unit Number [id number]    Blood Component Type RED CELLS,LR    Unit division 00    Status of Unit ISSUED    Transfusion Status OK TO TRANSFUSE    Crossmatch Result Compatible    Unit Number [id number]    Blood Component Type RED CELLS,LR    Unit division 00    Status of Unit ALLOCATED    Transfusion Status OK TO TRANSFUSE    Crossmatch Result Compatible    Unit Number [id number]    Blood Component Type RED CELLS,LR    Unit division 00    Status of Unit ALLOCATED    Transfusion Status OK TO TRANSFUSE    Crossmatch Result Compatible    Unit Number [id number]    Blood Component Type RED CELLS,LR    Unit division 00    Status of Unit ALLOCATED    Transfusion Status OK TO TRANSFUSE    Crossmatch Result Compatible   Prepare RBC     Status: None   Collection  Time: 05/09/14  2:30 AM  Result Value Ref Range   Order Confirmation ORDER PROCESSED BY BLOOD BANK   CBC     Status: Abnormal   Collection Time: 05/09/14  3:25 AM  Result Value Ref Range   WBC 6.0 4.0 - 10.5 K/uL   RBC 1.66 (L) 4.22 - 5.81 MIL/uL   Hemoglobin 6.0 (LL) 13.0 - 17.0 g/dL    Comment: CRITICAL VALUE NOTED.  VALUE IS CONSISTENT WITH PREVIOUSLY REPORTED AND CALLED VALUE.   HCT 17.8 (L) 39.0 - 52.0 %   MCV 107.2 (H) 78.0 - 100.0 fL   MCH 36.1 (H) 26.0 - 34.0 pg   MCHC 33.7 30.0 - 36.0 g/dL   RDW NOT CALCULATED 11.5 - 15.5 %   Platelets  150 - 400 K/uL    PLATELET CLUMPS NOTED ON SMEAR, COUNT APPEARS ADEQUATE  Comprehensive metabolic panel     Status: Abnormal   Collection Time: 05/09/14  3:25 AM  Result Value Ref Range   Sodium 141 137 - 147 mEq/L   Potassium 4.1 3.7 - 5.3 mEq/L   Chloride 111 96 - 112 mEq/L   CO2 21 19 - 32 mEq/L   Glucose, Bld 127 (H) 70 - 99 mg/dL   BUN 46 (H) 6 - 23 mg/dL   Creatinine, Ser 2.55 (H) 0.50 - 1.35 mg/dL   Calcium 7.7 (L) 8.4 - 10.5 mg/dL   Total Protein 4.0 (L) 6.0 - 8.3 g/dL   Albumin 1.0 (L) 3.5 - 5.2 g/dL  AST 16 0 - 37 U/L   ALT 10 0 - 53 U/L   Alkaline Phosphatase 50 39 - 117 U/L   Total Bilirubin <0.2 (L) 0.3 - 1.2 mg/dL   GFR calc non Af Amer 25 (L) >90 mL/min   GFR calc Af Amer 29 (L) >90 mL/min    Comment: (NOTE) The eGFR has been calculated using the CKD EPI equation. This calculation has not been validated in all clinical situations. eGFR's persistently <90 mL/min signify possible Chronic Kidney Disease.    Anion gap 9 5 - 15  Glucose, capillary     Status: Abnormal   Collection Time: 05/09/14  6:36 AM  Result Value Ref Range   Glucose-Capillary 117 (H) 70 - 99 mg/dL   Comment 1 Notify RN   CBC     Status: Abnormal   Collection Time: 05/09/14 11:00 AM  Result Value Ref Range   WBC 9.4 4.0 - 10.5 K/uL   RBC 2.84 (L) 4.22 - 5.81 MIL/uL   Hemoglobin 9.0 (L) 13.0 - 17.0 g/dL    Comment: REPEATED TO VERIFY POST  TRANSFUSION SPECIMEN    HCT 26.9 (L) 39.0 - 52.0 %   MCV 94.7 78.0 - 100.0 fL    Comment: POST TRANSFUSION SPECIMEN   MCH 31.7 26.0 - 34.0 pg   MCHC 33.5 30.0 - 36.0 g/dL   RDW 20.9 (H) 11.5 - 15.5 %   Platelets 244 150 - 400 K/uL  Prepare RBC     Status: None   Collection Time: 05/09/14 11:07 AM  Result Value Ref Range   Order Confirmation ORDER PROCESSED BY BLOOD BANK   Glucose, capillary     Status: Abnormal   Collection Time: 05/09/14 12:03 PM  Result Value Ref Range   Glucose-Capillary 122 (H) 70 - 99 mg/dL  CBC     Status: Abnormal   Collection Time: 05/09/14  2:35 PM  Result Value Ref Range   WBC 7.0 4.0 - 10.5 K/uL   RBC 1.98 (L) 4.22 - 5.81 MIL/uL   Hemoglobin 6.1 (LL) 13.0 - 17.0 g/dL    Comment: REPEATED TO VERIFY CRITICAL RESULT CALLED TO, READ BACK BY AND VERIFIED WITH: Adrian Prows RN 1500 05/09/14 A BROWNING    HCT 18.0 (L) 39.0 - 52.0 %   MCV 90.9 78.0 - 100.0 fL   MCH 30.8 26.0 - 34.0 pg   MCHC 33.9 30.0 - 36.0 g/dL   RDW 19.6 (H) 11.5 - 15.5 %   Platelets 188 150 - 400 K/uL  Prepare RBC     Status: None   Collection Time: 05/09/14  3:30 PM  Result Value Ref Range   Order Confirmation ORDER PROCESSED BY BLOOD BANK   Draw ABG 1 hour after initiation of ventilator     Status: Abnormal   Collection Time: 05/09/14  4:15 PM  Result Value Ref Range   FIO2 1.00 %   Delivery systems VENTILATOR    Mode PRESSURE REGULATED VOLUME CONTROL    VT 550 mL   Rate 14 resp/min   Peep/cpap 5.0 cm H20   pH, Arterial 7.160 (LL) 7.350 - 7.450    Comment: CRITICAL RESULT CALLED TO, READ BACK BY AND VERIFIED WITH: ALEX SHYSHKO,RN AT 1625 BY DEMETRIA SCOTT,RRT,RCP ON 05/09/2014    pCO2 arterial 37.7 35.0 - 45.0 mmHg   pO2, Arterial 382.0 (H) 80.0 - 100.0 mmHg   Bicarbonate 12.9 (L) 20.0 - 24.0 mEq/L   TCO2 14.0 0 - 100 mmol/L   Acid-base  deficit 14.1 (H) 0.0 - 2.0 mmol/L   O2 Saturation 99.4 %   Patient temperature 98.6    Collection site LEFT RADIAL    Drawn by [phone]     Sample type ARTERIAL    Allens test (pass/fail) PASS PASS  Prepare fresh frozen plasma     Status: None (Preliminary result)   Collection Time: 05/09/14  4:30 PM  Result Value Ref Range   Unit Number [id number]    Blood Component Type THAWED PLASMA    Unit division 00    Status of Unit ISSUED    Transfusion Status OK TO TRANSFUSE    Unit Number [id number]    Blood Component Type THAWED PLASMA    Unit division 00    Status of Unit ISSUED    Transfusion Status OK TO TRANSFUSE   Prepare RBC     Status: None   Collection Time: 05/09/14  5:00 PM  Result Value Ref Range   Order Confirmation ORDER PROCESSED BY BLOOD BANK    Dg Chest Port 1 View  05/09/2014   CLINICAL DATA:  Post intubation.  History of lung cancer  EXAM: PORTABLE CHEST - 1 VIEW  COMPARISON:  Radiograph 05/09/2014  FINDINGS: Endotracheal tube positioned 2 cm from carina. Right central venous line in distal SVC. No pneumothorax. There is a right chest tube in place along the right lateral hemi thorax. There is a loculated effusion along the right lateral chest wall. The second chest tube at the right lung base. Previously seen right subpulmonic pneumothorax has less well-defined. Left lung is clear.  IMPRESSION: 1. Endotracheal tube 2 cm from carina. 2. Foci of right central venous line without complicating features. 3. Two right chest tubes in place with postsurgical change in the right lower lobe.   Electronically Signed   By: Suzy Bouchard M.D.   On: 05/09/2014 15:37   Dg Chest Port 1 View  05/09/2014   CLINICAL DATA:  Pleural disease, HIV, recurrent right pleural effusion  EXAM: PORTABLE CHEST - 1 VIEW  COMPARISON:  Portable chest x-ray of May 08, 2014  FINDINGS: The left lung is well-expanded and clear. On the right there is confluent alveolar density in the mid and lower hemithorax. Pleural fluid persists along the lower lateral thoracic wall and in the minor fissure. The 2 chest tubes are unchanged. The proximal  port of the upper chest tube appears to lie outside the confines of the bony thorax. This positioning is stable. There is no significant change in the small right lower lateral pneumothorax. The heart and pulmonary vascularity are unremarkable. The mediastinum is not shifted. The bony thorax is unremarkable.  IMPRESSION: Persistent right-sided pleural effusion with interstitial opacities unchanged since yesterday's study. There is a stable small right lower lateral pneumothorax. The 2 right chest tubes are unchanged.   Electronically Signed   By: Huber  [place]   On: 05/09/2014 07:11   Dg Chest Port 1 View  05/08/2014   CLINICAL DATA:  Difficulty breathing.  Right-sided pneumothorax  EXAM: PORTABLE CHEST - 1 VIEW  COMPARISON:  May 07, 2014  FINDINGS: The right-sided chest tube positions are unchanged. Note that the side port of one of these chest tube is just lateral to the chest wall, a stable finding. Mild air in the subcutaneous tissues remains on the right inferiorly. The previously noted pneumothorax on the right laterally is smaller compared to 1 day prior. Consolidation and loculated effusion in the right mid and lower lung zones remain stable.  Left lung is clear. Asymmetric edema in the right lung compared to the left remains stable. Heart is upper normal in size with pulmonary vascularity within normal limits. No adenopathy.  IMPRESSION: The pneumothorax on the right appears slightly smaller currently. The degree of consolidation and loculated effusion on the right remains stable as does asymmetric edema on the right. Left lung clear. No change in cardiac silhouette.   Electronically Signed   By: Lowella Grip M.D.   On: 05/08/2014 07:15       Assessment/Plan 1. UGI bleed secondary to duodenal ulcer 2. Hemorrhagic shock 3. HIV + 4. Lung cancer, s/p VATS  Plan: 1. I have thoroughly discussed this case with Dr. Brantley Stage, Dr. Hulen Skains, Dr. Rosendo Gros, Dr. Joya Gaskins with CCM, and Dr. Larena Glassman  with IR.  The patient is too unstable to go to IR to try angioembolization.  We will take him to the operating room for laparotomy with oversew if able to stop the bleeding.  MTP has been initiated for resuscitation.  I have explained this to the patient's daughter.  She is agreeable to proceed with surgical intervention to save his life.    Ashley Bultema E 05/09/2014, 4:44 PM Pager: [phone]

## 2014-05-09 NOTE — Progress Notes (Addendum)
CRITICAL VALUE ALERT  Critical value received: Hgb 6.2  Date of notification:  12/21  Time of notification:  0200  Critical value read back: Yes   Nurse who received alert: Polly Cobia RN   MD notified (1st page):  [place]   Time of first page:  0210   MD notified (2nd page):  Time of second page:  Responding MD:  Lilian Kapur Nurse   Time MD responded:  [phone]

## 2014-05-09 NOTE — Progress Notes (Addendum)
      [address]       [place], [address]             [phone]       4 Days Post-Op Procedure(s) (LRB): VIDEO ASSISTED THORACOSCOPY (VATS)/DECORTICATION OF EMPYEMA (Right)  Subjective: Patient with cough-productive at times. Has some incisional pain this am.  Objective: Vital signs in last 24 hours: Temp:  [97.5 F (36.4 C)-99 F (37.2 C)] 97.6 F (36.4 C) (12/21 0538) Pulse Rate:  [94-124] 94 (12/21 0630) Cardiac Rhythm:  [-] Sinus tachycardia (12/21 0400) Resp:  [8-28] 11 (12/21 0630) BP: (69-116)/(37-64) 86/44 mmHg (12/21 0630) SpO2:  [89 %-100 %] 99 % (12/21 0630) FiO2 (%):  [98 %] 98 % (12/20 1739) Weight:  [125 lb 7.1 oz (56.9 kg)] 125 lb 7.1 oz (56.9 kg) (12/21 0530)     Intake/Output from previous day: 12/20 0701 - 12/21 0700 In: 2195 [P.O.:440; I.V.:1725; Blood:30] Out: 1610 [Urine:1610]   Physical Exam:  Cardiovascular: RRR Pulmonary: Coarse on the right Abdomen: Soft, non tender, bowel sounds present. Extremities: Trace bilateral lower extremity edema. Wounds: Dressing is clean and dry.   Chest Tubes: to suction and there is a persistent air leak that worsens with cough  Lab Results: CBC:  Recent Labs  05/09/14 0030  WBC 6.7  HGB 6.2*  HCT 18.9*  PLT 284   BMET:   Recent Labs  05/07/14 0500 05/09/14 0325  NA 140 141  K 4.5 4.1  CL 108 111  CO2 20 21  GLUCOSE 125* 127*  BUN 41* 46*  CREATININE 2.06* 2.55*  CALCIUM 7.8* 7.7*    PT/INR:  No results for input(s): LABPROT, INR in the last 72 hours. ABG:  INR: Will add last result for INR, ABG once components are confirmed Will add last 4 CBG results once components are confirmed  Assessment/Plan:  1. CV - ST in the 110's this am. 2.  Pulmonary - Chest tubes with 395 last 24 hours. Chest tubes are to suction this am.There is a persistent air leak that worsens with cough. He had a 2+ air leak after surgery. CXR shows small stable lateral right pneumothorax,  left lung clear, pleural effusion on right. Hope to remove anterior chest tube soon. Mucinex for cough. Encourage incentive spirometer. Await final pathology. 3. Anemia- H and H down to 6.2 and 18.9. Received 2 units of PRBC post op day one. Transfusing again.Had previous GI bleed (duodenal ulcer, NSAID use). H. Pylori negative.No hematochezia. On Protonix 40 bid. 4. Acute on chronic kidney disease-Creatinine up from 2.06 to 2.55 5. Decrease IVF  ZIMMERMAN,DONIELLE MPA-C 05/09/2014,7:54 AM   05/09/2014  Air leak only from anterior tube- still sig drainage from posterior tube- will leave both and place to water seal  tx to stepdown Cont Zosyn for empyema  patient examined and medical record reviewed,agree with above note. VAN TRIGT III,Kelley Polinsky 05/09/2014

## 2014-05-09 NOTE — Progress Notes (Signed)
Called to room. Patient in shock. Multiple dark stools w/ frank blood. Started having Dk bloody stools yesterday afternoon have continued over night. Hgb was down to 6.1  from 7.1, got 2 units during the course of the day on 12/21 Hgb went up to 9, then again down to 6.o and was in shock requiring pressors and ventilation. Spoke to Dr [employer]. Stated had large duodenal ulcers. Not clear that this was source but likely. He has now been intubated and is in shock getting PRBCs,.   BP 56/38 mmHg  Pulse 112  Temp(Src) 98.5 F (36.9 C) (Oral)  Resp 23  Ht 5\' 6"  (1.676 m)  Wt 56.9 kg (125 lb 7.1 oz)  BMI 20.26 kg/m2  SpO2 100%     Intake/Output Summary (Last 24 hours) at 05/09/14 1530 Last data filed at 05/09/14 1012  Gross per 24 hour  Intake 2512.25 ml  Output   1150 ml  Net 1362.25 ml    General elderly whote male, no in acute distress s/p acute GIB. Intubated getting aggressive volume resuscitation  HENT: intubated #8 ETT. No JVD, neck veins are flat Pulm: decreased on right. + 7/7 airleak persists on right. Scattered rhonchi. Card: rrr  Abd: dk gross bloody stool   Recent Labs Lab 05/06/14 0500 05/07/14 0500 05/09/14 0325  NA 139 140 141  K 5.0 4.5 4.1  CL 106 108 111  CO2 22 20 21   BUN 42* 41* 46*  CREATININE 2.20* 2.06* 2.55*  GLUCOSE 110* 125* 127*    Recent Labs Lab 05/09/14 0325 05/09/14 1100 05/09/14 1435  HGB 6.0* 9.0* 6.1*  HCT 17.8* 26.9* 18.0*  WBC 6.0 9.4 7.0  PLT PLATELET CLUMPS NOTED ON SMEAR, COUNT APPEARS ADEQUATE 244 188   ABG    Component Value Date/Time   PHART 7.367 05/06/2014 0224   PCO2ART 38.9 05/06/2014 0224   PO2ART 59.6* 05/06/2014 0224   HCO3 21.8 05/06/2014 0224   TCO2 23.0 05/06/2014 0224   ACIDBASEDEF 2.7* 05/06/2014 0224   O2SAT 89.9 05/06/2014 0224    Imp/Plan  1) hemorrhagic shock in setting of duodenal bleed Plan Central access Aggressive volume replacement w/ PRBCs: will transfuse for hemodynamic  stability Levophed and vasopressin-->MAP goal > 65 Intubate for airway protection Have spoken w/ both GI and surgical services. Will likely need repeat Endoscopy.  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 I have discussed with NP and other careteam providers such as pharmacist, RN and RRT & co-ordinated with consultants. In addition, I personally evaluated patient and elicited key history of recurrent malena, hypotension requiring pressors, exam findings of pallor, resp distress & labs showing Hb drop to 6.1.  Decision made to intubate for airway protection to facilitate procedures. Dropped Bp post intubation meds- NS bolus given while awaiting blood & neo gtt increased Rest per NP whose note is outlined above and that I agree with  and edited in full.   The patient is critically ill with multiple organ systems failure and requires high complexity decision making for assessment and support, frequent evaluation and titration of therapies, application of advanced monitoring technologies and extensive interpretation of multiple databases. Critical Care Time devoted to patient care services described in this note independent of APP time is 35 minutes.    Rigoberto Noel MD

## 2014-05-09 NOTE — Progress Notes (Signed)
[facility] Progress Note Patient Name: [name] [name] DOB: [birth year] MRN: [id number]   Date of Service  05/09/2014  HPI/Events of Note  Stool with blood clots and hypotension - Hgb of 6.2 down from 9.7  eICU Interventions  Plan: Transfuse 2 units of pRBC Post-transfusion CBC Consider re-consult GI      Intervention Category Intermediate Interventions: Bleeding - evaluation and treatment with blood products  Brittley Regner 05/09/2014, 1:59 AM

## 2014-05-09 NOTE — Progress Notes (Signed)
CRITICAL VALUE ALERT  Critical value received:  HGB 6.1  Date of notification:  05/09/2014 1500  Time of notification:  1500  Critical value read back:Yes.    Nurse who received alert:  Marisue Brooklyn Deretha Emory  MD notified (1st page):  Marni Griffon  Time of first page:  1500  MD notified (2nd page):  Time of second page:  Responding MD:  Salvadore Dom  Time MD responded:  1500

## 2014-05-10 ENCOUNTER — Encounter (HOSPITAL_COMMUNITY): Payer: Self-pay | Admitting: General Surgery

## 2014-05-10 ENCOUNTER — Inpatient Hospital Stay (HOSPITAL_COMMUNITY): Payer: Commercial Managed Care - HMO

## 2014-05-10 LAB — PREPARE FRESH FROZEN PLASMA
UNIT DIVISION: 0
Unit division: 0

## 2014-05-10 LAB — BASIC METABOLIC PANEL
Anion gap: 6 (ref 5–15)
BUN: 39 mg/dL — ABNORMAL HIGH (ref 6–23)
CO2: 18 mmol/L — ABNORMAL LOW (ref 19–32)
Calcium: 6.9 mg/dL — ABNORMAL LOW (ref 8.4–10.5)
Chloride: 119 mEq/L — ABNORMAL HIGH (ref 96–112)
Creatinine, Ser: 2.32 mg/dL — ABNORMAL HIGH (ref 0.50–1.35)
GFR calc non Af Amer: 28 mL/min — ABNORMAL LOW (ref >90)
GFR, EST AFRICAN AMERICAN: 32 mL/min — AB (ref >90)
Glucose, Bld: 125 mg/dL — ABNORMAL HIGH (ref 70–99)
POTASSIUM: 4.3 mmol/L (ref 3.5–5.1)
SODIUM: 143 mmol/L (ref 135–145)

## 2014-05-10 LAB — GLUCOSE, CAPILLARY
GLUCOSE-CAPILLARY: 105 mg/dL — AB (ref 70–99)
GLUCOSE-CAPILLARY: 74 mg/dL (ref 70–99)
GLUCOSE-CAPILLARY: 124 mg/dL — AB (ref 70–99)
Glucose-Capillary: 168 mg/dL — ABNORMAL HIGH (ref 70–99)
Glucose-Capillary: 86 mg/dL (ref 70–99)
Glucose-Capillary: 89 mg/dL (ref 70–99)

## 2014-05-10 LAB — ANAEROBIC CULTURE: Gram Stain: NONE SEEN

## 2014-05-10 LAB — CBC
HCT: 35.6 % — ABNORMAL LOW (ref 39.0–52.0)
Hemoglobin: 12.7 g/dL — ABNORMAL LOW (ref 13.0–17.0)
MCH: 30.3 pg (ref 26.0–34.0)
MCHC: 35.7 g/dL (ref 30.0–36.0)
MCV: 85 fL (ref 78.0–100.0)
PLATELETS: 132 10*3/uL — AB (ref 150–400)
RBC: 4.19 MIL/uL — AB (ref 4.22–5.81)
RDW: 15.1 % (ref 11.5–15.5)
WBC: 6.6 10*3/uL (ref 4.0–10.5)

## 2014-05-10 LAB — PREPARE PLATELET PHERESIS: Unit division: 0

## 2014-05-10 LAB — MAGNESIUM: Magnesium: 1.6 mg/dL (ref 1.5–2.5)

## 2014-05-10 LAB — PHOSPHORUS: Phosphorus: 5.4 mg/dL — ABNORMAL HIGH (ref 2.3–4.6)

## 2014-05-10 MED ORDER — CLINIMIX E/DEXTROSE (5/15) 5 % IV SOLN
INTRAVENOUS | Status: DC
  Filled 2014-05-10: qty 1000

## 2014-05-10 MED ORDER — CETYLPYRIDINIUM CHLORIDE 0.05 % MT LIQD: 7.0000 mL | Freq: Four times a day (QID) | OROMUCOSAL | Status: DC

## 2014-05-10 MED ORDER — TRACE MINERALS CR-CU-F-FE-I-MN-MO-SE-ZN IV SOLN
INTRAVENOUS | Status: AC
  Administered 2014-05-10: 18:00:00 via INTRAVENOUS
  Filled 2014-05-10: qty 1000

## 2014-05-10 MED ORDER — FAT EMULSION 20 % IV EMUL
120.0000 mL | INTRAVENOUS | Status: DC
  Filled 2014-05-10: qty 200

## 2014-05-10 MED ORDER — FENTANYL CITRATE 0.05 MG/ML IJ SOLN
25.0000 ug | INTRAMUSCULAR | Status: DC | PRN
  Administered 2014-05-10: 50 ug via INTRAVENOUS
  Administered 2014-05-10: 25 ug via INTRAVENOUS
  Administered 2014-05-10 – 2014-05-16 (×3): 50 ug via INTRAVENOUS
  Filled 2014-05-10 (×5): qty 2

## 2014-05-10 MED ORDER — DEXTROSE-NACL 5-0.45 % IV SOLN
INTRAVENOUS | Status: AC
  Administered 2014-05-10: 75 mL/h via INTRAVENOUS

## 2014-05-10 MED ORDER — FENTANYL CITRATE 0.05 MG/ML IJ SOLN
50.0000 ug | INTRAMUSCULAR | Status: DC | PRN
  Administered 2014-05-10: 50 ug via INTRAVENOUS

## 2014-05-10 MED ORDER — FENTANYL CITRATE 0.05 MG/ML IJ SOLN
50.0000 ug | INTRAMUSCULAR | Status: DC | PRN
  Administered 2014-05-10 (×2): 50 ug via INTRAVENOUS
  Filled 2014-05-10 (×3): qty 2

## 2014-05-10 MED ORDER — FAT EMULSION 20 % IV EMUL
120.0000 mL | INTRAVENOUS | Status: AC
  Administered 2014-05-10: 120 mL via INTRAVENOUS
  Filled 2014-05-10: qty 200

## 2014-05-10 MED ORDER — CHLORHEXIDINE GLUCONATE 0.12 % MT SOLN
15.0000 mL | Freq: Two times a day (BID) | OROMUCOSAL | Status: DC
  Administered 2014-05-10: 15 mL via OROMUCOSAL
  Filled 2014-05-10: qty 15

## 2014-05-10 MED ORDER — INSULIN ASPART 100 UNIT/ML ~~LOC~~ SOLN
0.0000 [IU] | SUBCUTANEOUS | Status: DC
  Administered 2014-05-10 – 2014-05-11 (×2): 2 [IU] via SUBCUTANEOUS
  Administered 2014-05-11: 3 [IU] via SUBCUTANEOUS
  Administered 2014-05-11 (×3): 2 [IU] via SUBCUTANEOUS
  Administered 2014-05-12 (×2): 3 [IU] via SUBCUTANEOUS
  Administered 2014-05-12 – 2014-05-13 (×2): 2 [IU] via SUBCUTANEOUS
  Administered 2014-05-13: 3 [IU] via SUBCUTANEOUS
  Administered 2014-05-13 – 2014-05-14 (×3): 2 [IU] via SUBCUTANEOUS
  Administered 2014-05-14: 3 [IU] via SUBCUTANEOUS
  Administered 2014-05-14 (×2): 2 [IU] via SUBCUTANEOUS
  Administered 2014-05-15 (×2): 3 [IU] via SUBCUTANEOUS
  Administered 2014-05-15 – 2014-05-16 (×2): 2 [IU] via SUBCUTANEOUS
  Administered 2014-05-17: 3 [IU] via SUBCUTANEOUS
  Administered 2014-05-17 – 2014-05-20 (×8): 2 [IU] via SUBCUTANEOUS
  Administered 2014-05-20: 3 [IU] via SUBCUTANEOUS
  Administered 2014-05-20: 2 [IU] via SUBCUTANEOUS
  Administered 2014-05-21: 3 [IU] via SUBCUTANEOUS
  Administered 2014-05-21: 2 [IU] via SUBCUTANEOUS

## 2014-05-10 NOTE — Progress Notes (Signed)
NUTRITION FOLLOW UP  Pt meets criteria for SEVERE MALNUTRITION in the context of chronic illness as evidenced by energy intake </= 75% for >/= 1 month and severe muscle mass loss.  DOCUMENTATION CODES Per approved criteria  -Severe malnutrition in the context of chronic illness   Intervention:    D/C Ensure Complete PO TID, as patient is now NPO  TPN dosing per Pharmacy to meet nutrition needs as able  Nutrition Dx:   Increased nutrition needs related to chronic illness as evidenced by estimated nutrition needs. Ongoing.  Goal:   Intake to meet >90% of estimated nutrition needs.  Monitor:   PO intake, labs, weight trend.  Assessment:   Pt with past medical history of chronic renal disease with a worsening creatinine, HIV with undetectable viral load, on antiviral therapy came into the hospital several days to admission for abdominal pain he was diagnosed with gallbladder stones. CT scan of the chest showed a mass and effusion had a thoracocentesis that showed fluid, no malignancies, he relates his abdominal pain is made worse by eating.  S/P emergent exploratory laparotomy with primary ligation of bleeding duodenal ulcer and placement of JP drain on 12/21. Required intubation, was extubated on 12/22.   Patient to begin TPN today for prolonged NPO status with Clinimix E 5/15 @ 40 ml/hr and lipids @ 5 ml/hr to provide 1080 ml, 922 kcal, and 48 grams protein per day. Meets 49% minimum estimated energy needs and 68% minimum estimated protein needs.  Patient is at risk for refeeding syndrome given severe PCM and recent prolonged inadequate intake.  Height: Ht Readings from Last 1 Encounters:  05/05/14 5\' 6"  (1.676 m)    Weight Status:   Wt Readings from Last 1 Encounters:  05/09/14 125 lb 7.1 oz (56.9 kg)   05/05/14 122 lb 5.7 oz (55.5 kg)   04/28/14 122 lb (55.339 kg)    Re-estimated needs:  Kcal: 1900-2100 Protein: 85-100 gm Fluid: 2 L  Skin: no issues  Diet  Order: Diet NPO time specified TPN (CLINIMIX-E) Adult   Intake/Output Summary (Last 24 hours) at 05/10/14 1327 Last data filed at 05/10/14 1300  Gross per 24 hour  Intake 7325.97 ml  Output   3235 ml  Net 4090.97 ml    Last BM: 12/21   Labs:   Recent Labs Lab 05/09/14 0325 05/09/14 1818 05/09/14 2020 05/10/14 0430 05/10/14 1028  NA 141 142 140 143  --   K 4.1 4.3 4.3 4.3  --   CL 111  --  111 119*  --   CO2 21  --  17* 18*  --   BUN 46*  --  39* 39*  --   CREATININE 2.55*  --  1.91* 2.32*  --   CALCIUM 7.7*  --  6.4* 6.9*  --   MG  --   --   --   --  1.6  PHOS  --   --   --   --  5.4*  GLUCOSE 127*  --  265* 125*  --     CBG (last 3)   Recent Labs  05/10/14 0343 05/10/14 0758 05/10/14 1150  GLUCAP 105* 86 74    Scheduled Meds: . insulin aspart  0-15 Units Subcutaneous 6 times per day    Continuous Infusions: . dextrose 5 % and 0.45% NaCl 75 mL/hr (05/10/14 1144)  . Marland KitchenTPN (CLINIMIX-E) Adult     And  . fat emulsion    . norepinephrine (LEVOPHED) Adult  infusion 3 mcg/min (05/10/14 1151)  . pantoprozole (PROTONIX) infusion 8 mg/hr (05/10/14 1149)    Molli Barrows, RD, LDN, [place] Pager [phone] After Hours Pager [phone]

## 2014-05-10 NOTE — Progress Notes (Signed)
PULMONARY / CRITICAL CARE MEDICINE   Name: [name] [name] MRN: [id number] DOB: 01-Sep-1945    ADMISSION DATE:  04/28/2014 CONSULTATION DATE:  04/28/2014  REFERRING MD :  Nils Pyle  CHIEF COMPLAINT:  S/P VATS  INITIAL PRESENTATION: 68 y/o male with HIV followed by Dr. [name] for an exudative effusion was admitted on 12/10 for pain in his abdomen and recurrence of the pleural effusion which had been sampled just three days prior.    SIGNIFICANT EVENTS/STUDIES:  12/7 Outpatient thoracentesis by IR> exudative, cytology negative 12/10 abdominal ultrasound> 13 mm gallstone, mild tenderness over gallbladder without gallbladder thickening, possible left upper pole renal calculus, right pleural effusion noted 12/10 renal ultrasound> no hydronephrosis 12/11 HIDA scan> patent cystic duct without evidence of acute cholecystitis 12/11 Echo> LVEF 65%, RV normal. 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 12/19: no distress. Still has airleak, but think that this is possibly d/t ant CT being out of place  12/21 Recurrent GI bleed with shock. Transfusion of multiple units of RBCs. GI re-eval. CCS consult. 12/21 Emergent laparotomy: ligation of bleeding duodenal ulcer. Returned to ICU intubated and on vasopressors 12/22 Weaned off vasopressors. Extubated  SUBJECTIVE:  Has tolerated extubation per my order earlier in day   VITAL SIGNS: Temp:  [97.3 F (36.3 C)-98.6 F (37 C)] 98 F (36.7 C) (12/22 1151) Pulse Rate:  [71-126] 126 (12/22 1500) Resp:  [0-31] 26 (12/22 1500) BP: (71-149)/(27-120) 95/57 mmHg (12/22 1430) SpO2:  [76 %-100 %] 93 % (12/22 1500) FiO2 (%):  [40 %-100 %] 40 % (12/22 1000) 2 liters   CVP:  [7 mmHg-17 mmHg] 10 mmHg VENTILATOR SETTINGS: Vent Mode:  [-] PSV;CPAP FiO2 (%):  [40 %-100 %] 40 % Set Rate:  [14 bmp] 14 bmp Vt Set:  [550 mL]  550 mL PEEP:  [5 cmH20] 5 cmH20 Pressure Support:  [5 cmH20] 5 cmH20 Plateau Pressure:  [26 WSF68-12 cmH20] 26 cmH20 INTAKE / OUTPUT:  Intake/Output Summary (Last 24 hours) at 05/10/14 1507 Last data filed at 05/10/14 1500  Gross per 24 hour  Intake 7356.57 ml  Output   3365 ml  Net 3991.57 ml    PHYSICAL EXAMINATION: General:  NAD Neuro:  No focal deficitsl HEENT:  [place]/AT, No JVD noted Cardiovascular:  RRR, no M Lungs: clear Abdomen:  Soft, mildly distended, 0 BS Ext: coll, no edema    LABS: I have reviewed all of today's lab results. Relevant abnormalities are discussed in the A/P section  CXR: RLL opacity  ASSESSMENT / PLAN:  PULMONARY A:  Newly diagnosed RLL cancer -  [place] + for Ca. Final path pending Status post VATS 12/17, talc pleurodesis VDRF post laparotomy - resolved P:   Supp O2 ICU monitoring TCTS managing chest tubes F/U path  CARDIOVASCULAR R IJ CVL 12/21 >>  A:  Hemorrhagic shock, resolved P:  ICU monitoring CVP goal 8-12 MAP goal > 60 mmHg  RENAL A:   CKD Recurrent AKI due to shock P:   Monitor BMET intermittently Monitor I/Os Correct electrolytes as indicated  GASTROINTESTINAL A:   Recurrent UGIB S/P laparotomy 12/21 Anticipated prolonged ileus P:   Continue PPI TPN ordered 12/22  HEMATOLOGIC A:  Acute blood loss anemia P:  DVT px: SQ heparin Monitor CBC intermittently Transfuse per usual ICU guidelines  INFECTIOUS A:   HIV  P:   Micro and abx reviewed  ENDOCRINE A:  TPN associated hyperglycemia P:   SSI while on TPN  NEUROLOGIC A:   Postoperative pain  P:   PRN fentanyl   CCM X 40 mins   Merton Border, MD ; [facility] [phone].  After 5:30 PM or weekends, call [phone]

## 2014-05-10 NOTE — Progress Notes (Signed)
1 Day Post-Op  Subjective: Awake on vent  Objective: Vital signs in last 24 hours: Temp:  [97.3 F (36.3 C)-98.6 F (37 C)] 98.1 F (36.7 C) (12/22 0341) Pulse Rate:  [33-173] 112 (12/22 0700) Resp:  [0-45] 0 (12/22 0700) BP: (50-149)/(19-120) 97/27 mmHg (12/22 0700) SpO2:  [76 %-100 %] 96 % (12/22 0700) FiO2 (%):  [40 %-100 %] 40 % (12/22 0400) Last BM Date: 05/09/14  Intake/Output from previous day: 12/21 0701 - 12/22 0700 In: 7330.7 [I.V.:3396.4; Blood:3934.3] Out: 3110 [Urine:1625; Drains:675; Blood:300; Chest Tube:210] Intake/Output this shift:    Incision/Wound:dressings dry JP serous no bile   Lab Results:   Recent Labs  05/09/14 2020 05/10/14 0430  WBC 5.7 6.6  HGB 12.9* 12.7*  HCT 36.9* 35.6*  PLT 101* 132*   BMET  Recent Labs  05/09/14 2020 05/10/14 0430  NA 140 143  K 4.3 4.3  CL 111 119*  CO2 17* 18*  GLUCOSE 265* 125*  BUN 39* 39*  CREATININE 1.91* 2.32*  CALCIUM 6.4* 6.9*   PT/INR  Recent Labs  05/09/14 2020  LABPROT 16.1*  INR 1.28   ABG  Recent Labs  05/09/14 1615 05/09/14 1818  PHART 7.160* 7.273*  HCO3 12.9* 20.0    Studies/Results: Dg Chest Port 1 View  05/10/2014   ADDENDUM REPORT: 05/10/2014 07:30  ADDENDUM: Right IJ line in stable position.   Electronically Signed   By: [place]   On: 05/10/2014 07:30   05/10/2014   CLINICAL DATA:  Shortness of breath.  EXAM: PORTABLE CHEST - 1 VIEW  COMPARISON:  05/19/2014.  FINDINGS: Interim placement of NG tube, its tip is below the left hemidiaphragm. Endotracheal tube tip noted in the orifice of the right mainstem bronchus. Proximal repositioning suggested. Two right chest tubes in stable position. Stable atelectasis and/or infiltrate right lung base with stable right pleural thickening. Mild left lung base subsegmental atelectasis. No pneumothorax heart size stable. Pulmonary vascularity normal. No acute osseous abnormality.  IMPRESSION: 1. Endotracheal tube tip noted in  the orifice of the right mainstem bronchus. Proximal repositioning suggested. 2. Interim placement of NG tube, its tip is below the left hemidiaphragm. Right chest tubes are in stable position. No pneumothorax. 3. Persistent right lower lobe atelectasis and/or infiltrate with persistent right pleural thickening. Mild left lower lobe subsegmental atelectasis. These results were called by telephone at the time of interpretation on 05/10/2014 at 7:22 am to Nurse Anderson Malta, who verbally acknowledged these results.  Electronically Signed: By: Marcello Moores  Register On: 05/10/2014 07:23   Dg Chest Port 1 View  05/09/2014   CLINICAL DATA:  Post intubation.  History of lung cancer  EXAM: PORTABLE CHEST - 1 VIEW  COMPARISON:  Radiograph 05/09/2014  FINDINGS: Endotracheal tube positioned 2 cm from carina. Right central venous line in distal SVC. No pneumothorax. There is a right chest tube in place along the right lateral hemi thorax. There is a loculated effusion along the right lateral chest wall. The second chest tube at the right lung base. Previously seen right subpulmonic pneumothorax has less well-defined. Left lung is clear.  IMPRESSION: 1. Endotracheal tube 2 cm from carina. 2. Foci of right central venous line without complicating features. 3. Two right chest tubes in place with postsurgical change in the right lower lobe.   Electronically Signed   By: Suzy Bouchard M.D.   On: 05/09/2014 15:37   Dg Chest Port 1 View  05/09/2014   CLINICAL DATA:  Pleural disease, HIV, recurrent right  pleural effusion  EXAM: PORTABLE CHEST - 1 VIEW  COMPARISON:  Portable chest x-ray of May 08, 2014  FINDINGS: The left lung is well-expanded and clear. On the right there is confluent alveolar density in the mid and lower hemithorax. Pleural fluid persists along the lower lateral thoracic wall and in the minor fissure. The 2 chest tubes are unchanged. The proximal port of the upper chest tube appears to lie outside the confines  of the bony thorax. This positioning is stable. There is no significant change in the small right lower lateral pneumothorax. The heart and pulmonary vascularity are unremarkable. The mediastinum is not shifted. The bony thorax is unremarkable.  IMPRESSION: Persistent right-sided pleural effusion with interstitial opacities unchanged since yesterday's study. There is a stable small right lower lateral pneumothorax. The 2 right chest tubes are unchanged.   Electronically Signed   By: Guy  [place]   On: 05/09/2014 07:11    Anti-infectives: Anti-infectives    Start     Dose/Rate Route Frequency Ordered Stop   05/09/14 1815  cefoTEtan (CEFOTAN) 1 g in dextrose 5 % 50 mL IVPB     1 g100 mL/hr over 30 Minutes Intravenous  Once 05/09/14 1805 05/09/14 1720   05/05/14 1900  piperacillin-tazobactam (ZOSYN) IVPB 3.375 g     3.375 g12.5 mL/hr over 240 Minutes Intravenous Every 8 hours 05/05/14 1533 05/08/14 1520   05/05/14 0600  cefUROXime (ZINACEF) 1.5 g in dextrose 5 % 50 mL IVPB     1.5 g100 mL/hr over 30 Minutes Intravenous On call to O.R. 05/04/14 1106 05/05/14 1345   05/03/14 0800  darunavir-cobicistat (PREZCOBIX) 800-150 MG per tablet 1 tablet  Status:  Discontinued     1 tablet Oral Daily with breakfast 05/02/14 1404 05/09/14 2051   05/01/14 1000  lamiVUDine (EPIVIR) 10 MG/ML solution 100 mg  Status:  Discontinued     100 mg Oral Daily 04/30/14 1354 05/09/14 2051   04/30/14 1000  zidovudine (RETROVIR) capsule 300 mg  Status:  Discontinued     300 mg Oral Every 12 hours 04/30/14 0922 05/09/14 2051   04/29/14 1800  atazanavir-cobicistat (Evotaz) 300-150 MG per tablet 1 tablet  Status:  Discontinued     1 tablet Oral Daily with breakfast 04/29/14 1408 05/02/14 1404   04/29/14 1000  cefTRIAXone (ROCEPHIN) 1 g in dextrose 5 % 50 mL IVPB - Premix  Status:  Discontinued     1 g100 mL/hr over 30 Minutes Intravenous Every 24 hours 04/29/14 0856 05/03/14 1214   04/28/14 1615  lamiVUDine (EPIVIR) tablet 150  mg  Status:  Discontinued     150 mg Oral Daily 04/28/14 1454 04/30/14 1354   04/28/14 1600  zidovudine (RETROVIR) tablet 300 mg  Status:  Discontinued     300 mg Oral 2 times daily 04/28/14 1454 04/30/14 0921      Assessment/Plan: s/p Procedure(s): EXPLORATORY LAPAROTOMY (N/A) DUODENOTOMY with Primary ligation Bleeding Duodenal Ulcer (N/A) Stable HGB Cont NGT NPO May need TNA GIVEN POOR NUTRITIONAL STATUS   LOS: 12 days    Kamala Kolton A. 05/10/2014

## 2014-05-10 NOTE — Progress Notes (Signed)
Pt had a medium Black tarry, dark red stool at 2300. Will continue to monitor and assess.

## 2014-05-10 NOTE — Progress Notes (Signed)
Patient ID: [name] [name], male   DOB: 01/28/1946, 68 y.o.   MRN: [id number] [facility] Gastroenterology Progress Note  [name] [name] 68 y.o. 09-27-1945   Subjective: Recently extubated. Surgery last night for bleeding duodenal ulcer.  Objective: Vital signs in last 24 hours: Filed Vitals:   05/10/14 1151  BP: 100/56  Pulse: 126  Temp: 98 F (36.7 C)  Resp: 24    Physical Exam: Gen: lethargic, no acute distress Abd: abdominal wound dressing attached and not removed, bloody drainage from JP drain  Lab Results:  Recent Labs  05/09/14 2020 05/10/14 0430 05/10/14 1028  NA 140 143  --   K 4.3 4.3  --   CL 111 119*  --   CO2 17* 18*  --   GLUCOSE 265* 125*  --   BUN 39* 39*  --   CREATININE 1.91* 2.32*  --   CALCIUM 6.4* 6.9*  --   MG  --   --  1.6  PHOS  --   --  5.4*    Recent Labs  05/09/14 0325  AST 16  ALT 10  ALKPHOS 50  BILITOT <0.2*  PROT 4.0*  ALBUMIN 1.0*    Recent Labs  05/09/14 2020 05/10/14 0430  WBC 5.7 6.6  HGB 12.9* 12.7*  HCT 36.9* 35.6*  MCV 86.8 85.0  PLT 101* 132*    Recent Labs  05/09/14 2020  LABPROT 16.1*  INR 1.28      Assessment/Plan: S/P exp lap for bleeding duodenal ulcer. Hgb stable now at 12.7. Recently extubated. Pressors being weaned. Agree with TNA. Will follow prn.   [place] C. 05/10/2014, 12:25 PM

## 2014-05-10 NOTE — Progress Notes (Addendum)
PARENTERAL NUTRITION CONSULT NOTE - INITIAL  Pharmacy Consult for TPN Indication: poor nutritional status, prolonged ileus  No Known Allergies  Patient Measurements: Height: 5\' 6"  (167.6 cm) Weight: 125 lb 7.1 oz (56.9 kg) IBW/kg (Calculated) : 63.8   Intake/Output from previous day: 12/21 0701 - 12/22 0700 In: 7330.7 [I.V.:3396.4; Blood:3934.3] Out: 3110 [Urine:1625; Drains:675; Blood:300; Chest Tube:210] Intake/Output from this shift: Total I/O In: 577.2 [I.V.:577.2] Out: 125 [Urine:125]  Labs:  Recent Labs  05/09/14 1807 05/09/14 1818 05/09/14 2020 05/10/14 0430  WBC 5.1  --  5.7 6.6  HGB 7.2* 5.8* 12.9* 12.7*  HCT 20.9* 17.0* 36.9* 35.6*  PLT 90*  --  101* 132*  APTT  --   --  30  --   INR  --   --  1.28  --      Recent Labs  05/09/14 0325 05/09/14 1818 05/09/14 2020 05/10/14 0430  NA 141 142 140 143  K 4.1 4.3 4.3 4.3  CL 111  --  111 119*  CO2 21  --  17* 18*  GLUCOSE 127*  --  265* 125*  BUN 46*  --  39* 39*  CREATININE 2.55*  --  1.91* 2.32*  CALCIUM 7.7*  --  6.4* 6.9*  PROT 4.0*  --   --   --   ALBUMIN 1.0*  --   --   --   AST 16  --   --   --   ALT 10  --   --   --   ALKPHOS 50  --   --   --   BILITOT <0.2*  --   --   --    Estimated Creatinine Clearance: 25.2 mL/min (by C-G formula based on Cr of 2.32).    Recent Labs  05/09/14 2351 05/10/14 0343 05/10/14 0758  GLUCAP 168* 105* 86    Medical History: Past Medical History  Diagnosis Date  . HIV (human immunodeficiency virus infection) dx'd 2010  . High cholesterol   . Bleeding per rectum   . Depression   . Chronic renal disease   . Peripheral neuropathy   . Pleural effusion on right     Medications:  Prescriptions prior to admission  Medication Sig Dispense Refill Last Dose  . atazanavir-cobicistat (EVOTAZ) 300-150 MG per tablet Take 1 tablet by mouth daily. Swallow whole. Do NOT crush, cut or chew tablet. Take with food. 30 tablet 11 04/27/2014 at Unknown time  .  buPROPion (WELLBUTRIN XL) 150 MG 24 hr tablet Take 150 mg by mouth daily.   04/27/2014 at Unknown time  . Cholecalciferol (VITAMIN D3) 2000 UNITS capsule Take 2,000 Units by mouth daily.   04/27/2014 at Unknown time  . HYDROcodone-acetaminophen (NORCO) 5-325 MG per tablet Take 1 tablet by mouth every 6 (six) hours as needed for moderate pain. 30 tablet 0 Past Week at Unknown time  . lamiVUDine (EPIVIR) 150 MG tablet Take 1 tablet (150 mg total) by mouth daily. 30 tablet 11 04/27/2014 at Unknown time  . Multiple Vitamin (MULTIVITAMIN WITH MINERALS) TABS tablet Take 1 tablet by mouth daily.   04/27/2014 at Unknown time  . rosuvastatin (CRESTOR) 5 MG tablet Take 1 tablet (5 mg total) by mouth daily. 30 tablet 11 04/27/2014 at Unknown time  . zidovudine (RETROVIR) 300 MG tablet Take 1 tablet (300 mg total) by mouth 2 (two) times daily. 60 tablet 11 04/27/2014 at Unknown time  . Multiple Vitamins-Minerals (CENTRUM SILVER PO) Take 1 tablet by mouth daily.  Not Taking   Admit:   Admitted with RUQ abd pain GI: Recurrent GI bleeding s/p duodenotomy and JP tube Endo:  Ssi, cbgs 86-218 Insulin Requirements in the past 24 hours: 10 units Lytes: K 4.3, ion Ca 0.93, Na 143, Mag 1.6, Phos 5.4 , Co Ca 9.3 Renal: SrCr 2.32, h/o CRI Pulm: intubated, s/p VATS/decort of empyema with talc pleurodesis Cards: on pressors vasopressin, norepi Hepatobil: Alb low, LFTs wnl , INR 1.28 Neuro: awake on vent ID:  Zosyn post VATS, HIV meds currently on hold  Best Practices: scd's, ppi TPN Access: 12/21 R IJ CVC TPN day#:0  Current Nutrition:  NPO  Assessment: 68 yo man to start TPN for NPO status and poor nutritional status.  His calcium is likely low normal given above labs.  Ca X phos <55 so will add lytes to TPN.  Will not supplement Mag at this time due to renal function status but will keep close watch on lytes as may be at risk for refeeding.  Nutritional Goals:  1900-2100 kCal, 85-100 grams of protein per  day Clinimix E 5/20 @ 75 ml/hr with 20%  lipids @ 19ml/hr will provide 90 gm protein and 2064 Kcal/day  Plan:  Start Clinimix E 5/15 at 40 ml/hr Start lipids at 5 ml/hr F/u am labs.  Thanks for allowing pharmacy to be a part of this patient's care.  Excell Seltzer, PharmD Clinical Pharmacist, [phone]  05/10/2014,10:13 AM

## 2014-05-10 NOTE — Procedures (Signed)
Extubation Procedure Note  Patient Details:   Name: [name] [name] DOB: May 18, 1946 MRN: [id number]   Airway Documentation:     Evaluation  O2 sats: stable throughout Complications: No apparent complications Patient did tolerate procedure well. Bilateral Breath Sounds: Rhonchi Suctioning: Oral Yes  Patient extubated to [name]. No complications. Vital signs stable at this time. Patient tolerated well. RN and RT at bedside. RT will continue to monitor.  Mcneil Sober 05/10/2014, 10:49 AM

## 2014-05-10 NOTE — Progress Notes (Signed)
Pt's JP drain put out 200 mls of serosanguinous fluid at 2300 and 0000. Notified MD Ralene Ok and stated to monitor and assess.

## 2014-05-10 NOTE — Progress Notes (Signed)
PT Cancellation Note  Patient Details Name: [name] [name] MRN: [id number] DOB: 21-Dec-1945   Cancelled Treatment:    Reason Eval/Treat Not Completed: Medical issues which prohibited therapy (pt just extubated at 1045 and not yet 4hrs post-extubation. will see next date)   [name] 05/10/2014, 11:35 AM [name], [name]

## 2014-05-10 NOTE — Progress Notes (Addendum)
      [address]       ,[place] [address]             [phone]       1 Day Post-Op Procedure(s) (LRB): EXPLORATORY LAPAROTOMY (N/A) DUODENOTOMY with Primary ligation Bleeding Duodenal Ulcer (N/A)  Subjective: Patient had bleeding from his duodenal ulcer yesterday. Required ex lap with primary ligation of bleeding. Patient is awake on vent  Objective: Vital signs in last 24 hours: Temp:  [97.3 F (36.3 C)-98.6 F (37 C)] 98.2 F (36.8 C) (12/22 0807) Pulse Rate:  [33-173] 122 (12/22 1000) Cardiac Rhythm:  [-] Sinus tachycardia (12/22 0400) Resp:  [0-45] 20 (12/22 1000) BP: (50-149)/(19-120) 88/64 mmHg (12/22 1000) SpO2:  [76 %-100 %] 92 % (12/22 1000) FiO2 (%):  [40 %-100 %] 40 % (12/22 1000)  CVP:  [7 mmHg-17 mmHg] 10 mmHg  Intake/Output from previous day: 12/21 0701 - 12/22 0700 In: 7330.7 [I.V.:3396.4; Blood:3934.3] Out: 3110 [Urine:1625; Drains:675; Blood:300; Chest Tube:210]   Physical Exam:  Cardiovascular: Tachycardic Pulmonary: Coarse breath sounds R>L Abdomen: Soft, non tender, bowel sounds present. Extremities: SCDs in place Wounds: Dressing is clean and dry.   Chest Tubes: to water seal  and there is an leak that worsens with cough  Lab Results: CBC:  Recent Labs  05/09/14 2020 05/10/14 0430  WBC 5.7 6.6  HGB 12.9* 12.7*  HCT 36.9* 35.6*  PLT 101* 132*   BMET:   Recent Labs  05/09/14 2020 05/10/14 0430  NA 140 143  K 4.3 4.3  CL 111 119*  CO2 17* 18*  GLUCOSE 265* 125*  BUN 39* 39*  CREATININE 1.91* 2.32*  CALCIUM 6.4* 6.9*    PT/INR:   Recent Labs  05/09/14 2020  LABPROT 16.1*  INR 1.28   ABG:  INR: Will add last result for INR, ABG once components are confirmed Will add last 4 CBG results once components are confirmed  Assessment/Plan:  1. CV - ST in the 120's this am. On Levophed drip. 2.  Pulmonary - Chest tubes with 210 last 24 hours. Chest tubes are to water seal this am.There is an air  leak that worsens with cough. He had a 2+ air leak after surgery. CXR shows patient rotated to the left, left lung clear, pleural effusion and atelectasis on right. Vent management per CCM.Mucinex for cough. Await final pathology. 3. Anemia- H and H stable at 12.7 and 35.6.  On Protonix 40 bid. 4. Acute on chronic kidney disease-Creatinine up from 1.91 to 2.32. Monitor 5. Mild thrombocytopenia-platelets 132,000  ZIMMERMAN,DONIELLE MPA-C 05/10/2014,10:28 AM   05/10/2014   Patient now extubated- norepi almost weaned off Will leave both chest tubes to water seal Air leak much better Hope to remove posterior CT tomorrow Final path to be signed out this pm Agree with plan for TNA  patient examined and medical record reviewed,agree with above note. VAN TRIGT III,Bernarr Longsworth 05/10/2014

## 2014-05-10 NOTE — Consult Note (Signed)
[facility] Care Management referral received.  Spoke with inpatient RNCM about referral and discussion in length of stay meeting.  Information on patient's progress and procedures noted.  Will continue to follow the patient's progress and offer [facility] Care Management services when the time is appropriate.  Patient recently extubated.  Natividad Brood, RN, BSN, CCM, [facility], [place] Management, [phone].

## 2014-05-11 ENCOUNTER — Inpatient Hospital Stay (HOSPITAL_COMMUNITY): Payer: Commercial Managed Care - HMO

## 2014-05-11 LAB — PREPARE FRESH FROZEN PLASMA
Unit division: 0
Unit division: 0
Unit division: 0
Unit division: 0
Unit division: 0
Unit division: 0

## 2014-05-11 LAB — MAGNESIUM: MAGNESIUM: 1.7 mg/dL (ref 1.5–2.5)

## 2014-05-11 LAB — COMPREHENSIVE METABOLIC PANEL
ALBUMIN: 1.7 g/dL — AB (ref 3.5–5.2)
ALK PHOS: 61 U/L (ref 39–117)
ALT: 37 U/L (ref 0–53)
AST: 43 U/L — AB (ref 0–37)
Anion gap: 7 (ref 5–15)
BUN: 38 mg/dL — ABNORMAL HIGH (ref 6–23)
CALCIUM: 7.5 mg/dL — AB (ref 8.4–10.5)
CO2: 19 mmol/L (ref 19–32)
Chloride: 115 mEq/L — ABNORMAL HIGH (ref 96–112)
Creatinine, Ser: 2.31 mg/dL — ABNORMAL HIGH (ref 0.50–1.35)
GFR calc Af Amer: 32 mL/min — ABNORMAL LOW (ref >90)
GFR calc non Af Amer: 28 mL/min — ABNORMAL LOW (ref >90)
Glucose, Bld: 148 mg/dL — ABNORMAL HIGH (ref 70–99)
Potassium: 3.8 mmol/L (ref 3.5–5.1)
SODIUM: 141 mmol/L (ref 135–145)
TOTAL PROTEIN: 4 g/dL — AB (ref 6.0–8.3)
Total Bilirubin: 0.7 mg/dL (ref 0.3–1.2)

## 2014-05-11 LAB — CBC
HEMATOCRIT: 33.2 % — AB (ref 39.0–52.0)
HEMOGLOBIN: 11.4 g/dL — AB (ref 13.0–17.0)
MCH: 29.6 pg (ref 26.0–34.0)
MCHC: 34.3 g/dL (ref 30.0–36.0)
MCV: 86.2 fL (ref 78.0–100.0)
Platelets: 150 10*3/uL (ref 150–400)
RBC: 3.85 MIL/uL — AB (ref 4.22–5.81)
RDW: 16.4 % — ABNORMAL HIGH (ref 11.5–15.5)
WBC: 6.7 10*3/uL (ref 4.0–10.5)

## 2014-05-11 LAB — GLUCOSE, CAPILLARY
GLUCOSE-CAPILLARY: 130 mg/dL — AB (ref 70–99)
GLUCOSE-CAPILLARY: 125 mg/dL — AB (ref 70–99)
GLUCOSE-CAPILLARY: 132 mg/dL — AB (ref 70–99)
Glucose-Capillary: 111 mg/dL — ABNORMAL HIGH (ref 70–99)
Glucose-Capillary: 120 mg/dL — ABNORMAL HIGH (ref 70–99)
Glucose-Capillary: 148 mg/dL — ABNORMAL HIGH (ref 70–99)

## 2014-05-11 LAB — BLOOD PRODUCT ORDER (VERBAL) VERIFICATION

## 2014-05-11 LAB — PREALBUMIN: PREALBUMIN: 9.7 mg/dL — AB (ref 17.0–34.0)

## 2014-05-11 LAB — PHOSPHORUS: PHOSPHORUS: 4.1 mg/dL (ref 2.3–4.6)

## 2014-05-11 LAB — TRIGLYCERIDES: Triglycerides: 273 mg/dL — ABNORMAL HIGH (ref <150)

## 2014-05-11 MED ORDER — CHLORHEXIDINE GLUCONATE 0.12 % MT SOLN
15.0000 mL | Freq: Two times a day (BID) | OROMUCOSAL | Status: DC
  Administered 2014-05-11 – 2014-05-23 (×19): 15 mL via OROMUCOSAL
  Filled 2014-05-11 (×26): qty 15

## 2014-05-11 MED ORDER — TRACE MINERALS CR-CU-F-FE-I-MN-MO-SE-ZN IV SOLN
INTRAVENOUS | Status: AC
  Administered 2014-05-11: 17:00:00 via INTRAVENOUS
  Filled 2014-05-11: qty 2000

## 2014-05-11 MED ORDER — FAT EMULSION 20 % IV EMUL
240.0000 mL | INTRAVENOUS | Status: AC
  Administered 2014-05-11: 240 mL via INTRAVENOUS
  Filled 2014-05-11: qty 250

## 2014-05-11 MED ORDER — CETYLPYRIDINIUM CHLORIDE 0.05 % MT LIQD
7.0000 mL | Freq: Two times a day (BID) | OROMUCOSAL | Status: DC
  Administered 2014-05-11 – 2014-05-22 (×22): 7 mL via OROMUCOSAL

## 2014-05-11 NOTE — Progress Notes (Signed)
2 Days Post-Op  Subjective: Pt extubated and awake.  Wants NGT out.  No blood in NGT  Objective: Vital signs in last 24 hours: Temp:  [97.3 F (36.3 C)-98.2 F (36.8 C)] 97.4 F (36.3 C) (12/23 0749) Pulse Rate:  [71-127] 118 (12/23 0600) Resp:  [8-32] 24 (12/23 0700) BP: (80-100)/(44-68) 95/68 mmHg (12/23 0700) SpO2:  [81 %-97 %] 88 % (12/23 0600) FiO2 (%):  [40 %] 40 % (12/22 1000) Last BM Date: 05/09/14  Intake/Output from previous day: 12/22 0701 - 12/23 0700 In: 2034.9 [I.V.:1464.9; TPN:495] Out: 2325 [Urine:1675; Emesis/NG output:40; Drains:180; Chest Tube:430] Intake/Output this shift:    Incision/Wound:OPEN AND CLEAN.  JP serous   Lab Results:   Recent Labs  05/10/14 0430 05/11/14 0500  WBC 6.6 6.7  HGB 12.7* 11.4*  HCT 35.6* 33.2*  PLT 132* 150   BMET  Recent Labs  05/10/14 0430 05/11/14 0500  NA 143 141  K 4.3 3.8  CL 119* 115*  CO2 18* 19  GLUCOSE 125* 148*  BUN 39* 38*  CREATININE 2.32* 2.31*  CALCIUM 6.9* 7.5*   PT/INR  Recent Labs  05/09/14 2020  LABPROT 16.1*  INR 1.28   ABG  Recent Labs  05/09/14 1615 05/09/14 1818  PHART 7.160* 7.273*  HCO3 12.9* 20.0    Studies/Results: Dg Chest Port 1 View  05/11/2014   CLINICAL DATA:  Shortness of breath, weakness  EXAM: PORTABLE CHEST - 1 VIEW  COMPARISON:  05/10/2014  FINDINGS: Cardiomediastinal silhouette is stable. Endotracheal tube has been removed. Stable NG tube position. Stable dual lumen right IJ catheter position. Again noted right lower lobe atelectasis or infiltrate. Small loculated pneumothorax right basilar. Stable right lower chest tubes position. No pulmonary edema. Small left pleural effusion with left basilar atelectasis.  IMPRESSION: Stable right chest tubes position. Small loculated right basilar pneumothorax. Again noted right lower lobe atelectasis or infiltrate. Stable right pleural thickening. Endotracheal tube has been removed. No pneumothorax.   Electronically  Signed   By: Lahoma Crocker M.D.   On: 05/11/2014 07:55   Dg Chest Port 1 View  05/10/2014   ADDENDUM REPORT: 05/10/2014 07:30  ADDENDUM: Right IJ line in stable position.   Electronically Signed   By: [place]   On: 05/10/2014 07:30   05/10/2014   CLINICAL DATA:  Shortness of breath.  EXAM: PORTABLE CHEST - 1 VIEW  COMPARISON:  05/19/2014.  FINDINGS: Interim placement of NG tube, its tip is below the left hemidiaphragm. Endotracheal tube tip noted in the orifice of the right mainstem bronchus. Proximal repositioning suggested. Two right chest tubes in stable position. Stable atelectasis and/or infiltrate right lung base with stable right pleural thickening. Mild left lung base subsegmental atelectasis. No pneumothorax heart size stable. Pulmonary vascularity normal. No acute osseous abnormality.  IMPRESSION: 1. Endotracheal tube tip noted in the orifice of the right mainstem bronchus. Proximal repositioning suggested. 2. Interim placement of NG tube, its tip is below the left hemidiaphragm. Right chest tubes are in stable position. No pneumothorax. 3. Persistent right lower lobe atelectasis and/or infiltrate with persistent right pleural thickening. Mild left lower lobe subsegmental atelectasis. These results were called by telephone at the time of interpretation on 05/10/2014 at 7:22 am to Nurse Anderson Malta, who verbally acknowledged these results.  Electronically Signed: By: Marcello Moores  Register On: 05/10/2014 07:23   Dg Chest Port 1 View  05/09/2014   CLINICAL DATA:  Post intubation.  History of lung cancer  EXAM: PORTABLE CHEST - 1 VIEW  COMPARISON:  Radiograph 05/09/2014  FINDINGS: Endotracheal tube positioned 2 cm from carina. Right central venous line in distal SVC. No pneumothorax. There is a right chest tube in place along the right lateral hemi thorax. There is a loculated effusion along the right lateral chest wall. The second chest tube at the right lung base. Previously seen right subpulmonic  pneumothorax has less well-defined. Left lung is clear.  IMPRESSION: 1. Endotracheal tube 2 cm from carina. 2. Foci of right central venous line without complicating features. 3. Two right chest tubes in place with postsurgical change in the right lower lobe.   Electronically Signed   By: Suzy Bouchard M.D.   On: 05/09/2014 15:37    Anti-infectives: Anti-infectives    Start     Dose/Rate Route Frequency Ordered Stop   05/09/14 1815  cefoTEtan (CEFOTAN) 1 g in dextrose 5 % 50 mL IVPB     1 g100 mL/hr over 30 Minutes Intravenous  Once 05/09/14 1805 05/09/14 1720   05/05/14 1900  piperacillin-tazobactam (ZOSYN) IVPB 3.375 g     3.375 g12.5 mL/hr over 240 Minutes Intravenous Every 8 hours 05/05/14 1533 05/08/14 1520   05/05/14 0600  cefUROXime (ZINACEF) 1.5 g in dextrose 5 % 50 mL IVPB     1.5 g100 mL/hr over 30 Minutes Intravenous On call to O.R. 05/04/14 1106 05/05/14 1345   05/03/14 0800  darunavir-cobicistat (PREZCOBIX) 800-150 MG per tablet 1 tablet  Status:  Discontinued     1 tablet Oral Daily with breakfast 05/02/14 1404 05/09/14 2051   05/01/14 1000  lamiVUDine (EPIVIR) 10 MG/ML solution 100 mg  Status:  Discontinued     100 mg Oral Daily 04/30/14 1354 05/09/14 2051   04/30/14 1000  zidovudine (RETROVIR) capsule 300 mg  Status:  Discontinued     300 mg Oral Every 12 hours 04/30/14 0922 05/09/14 2051   04/29/14 1800  atazanavir-cobicistat (Evotaz) 300-150 MG per tablet 1 tablet  Status:  Discontinued     1 tablet Oral Daily with breakfast 04/29/14 1408 05/02/14 1404   04/29/14 1000  cefTRIAXone (ROCEPHIN) 1 g in dextrose 5 % 50 mL IVPB - Premix  Status:  Discontinued     1 g100 mL/hr over 30 Minutes Intravenous Every 24 hours 04/29/14 0856 05/03/14 1214   04/28/14 1615  lamiVUDine (EPIVIR) tablet 150 mg  Status:  Discontinued     150 mg Oral Daily 04/28/14 1454 04/30/14 1354   04/28/14 1600  zidovudine (RETROVIR) tablet 300 mg  Status:  Discontinued     300 mg Oral 2 times daily  04/28/14 1454 04/30/14 0921      Assessment/Plan: s/p Procedure(s): EXPLORATORY LAPAROTOMY (N/A) DUODENOTOMY with Primary ligation Bleeding Duodenal Ulcer (N/A) Stable extubated.  Needs UGI on POD 5.   Can have ice chips. PPI  OOB ok  LOS: 13 days    Sue Mcalexander A. 05/11/2014

## 2014-05-11 NOTE — Evaluation (Signed)
Physical Therapy Evaluation Patient Details Name: [name] [name] MRN: [id number] DOB: 03/09/1946 Today's Date: 05/11/2014   History of Present Illness  68 y/o male with HIV followed by Dr. [name] for an exudative effusion was admitted on 12/10 for pain in his abdomen and recurrence of the pleural effusion which had been sampled just three days prior. s/p VATS for lung biopsy 12/17. s/p EXPLORATORY LAPAROTOMY and DUODENOTOMY with Primary ligation Bleeding Duodenal Ulcer  12/21.  Clinical Impression  Pt admitted with above diagnosis. Pt currently with functional limitations due to the deficits listed below (see PT Problem List). Tolerated transfer training and bed mobility with min guard-min assist today. Eager to work with therapy and states he has been performing exercises on his own so that he can regain his strength. Due to medical complexity have recommended CIR for continued rehab to improve patients functional independence. Will monitor progress acutely and update recommendations as appropriate. Pt will benefit from skilled PT to increase their independence and safety with mobility to allow discharge to the venue listed below.       Follow Up Recommendations CIR    Equipment Recommendations   (TBD based on progression)    Recommendations for Other Services Rehab consult;OT consult     Precautions / Restrictions Precautions Precautions: Other (comment);Fall (chest tube) Precaution Comments: chest tube Restrictions Weight Bearing Restrictions: No      Mobility  Bed Mobility Overal bed mobility: Needs Assistance Bed Mobility: Supine to Sit     Supine to sit: Min guard     General bed mobility comments: min guard for safety. VC for technique. Impulsive to rise despite cues to wait so we could manage lines.  Transfers Overall transfer level: Needs assistance Equipment used: Rolling walker (2 wheeled) Transfers: Sit to/from [facility] Sit to Stand: Min  guard Stand pivot transfers: Min assist       General transfer comment: Min guard to stand. pt is impulsive needs cues to wait while lines/leads are managed in a safe position. Min assist for walker placement with pivot transfer to chair. VC for hand placement and sequencing.  Ambulation/Gait                Stairs            Wheelchair Mobility    Modified Rankin (Stroke Patients Only)       Balance Overall balance assessment: Needs assistance Sitting-balance support: No upper extremity supported;Feet supported Sitting balance-Leahy Scale: Good     Standing balance support: Bilateral upper extremity supported Standing balance-Leahy Scale: Poor                               Pertinent Vitals/Pain Pain Assessment: No/denies pain  Supine: -BP 97/63 -HR 122 -SpO2 100% on 5L supplemental O2.  Transferring HR up to 130  Seated: -BP 86/57 (denies any symptoms) -HR 125     Home Living Family/patient expects to be discharged to:: Unsure Living Arrangements: Alone Available Help at Discharge: Family;Available PRN/intermittently (family member checks on patient daily) Type of Home: House Home Access: Stairs to enter Entrance Stairs-Rails: None Entrance Stairs-Number of Steps: 1 Home Layout: One level Home Equipment: None      Prior Function Level of Independence: Independent               Hand Dominance   Dominant Hand: Right    Extremity/Trunk Assessment   Upper Extremity Assessment: Defer to OT evaluation  Lower Extremity Assessment: Generalized weakness         Communication   Communication: No difficulties  Cognition Arousal/Alertness: Awake/alert Behavior During Therapy: Impulsive Overall Cognitive Status: Within Functional Limits for tasks assessed                      General Comments      Exercises General Exercises - Lower Extremity Ankle Circles/Pumps: AROM;Both;10  reps;Seated Straight Leg Raises: Strengthening;Both;5 reps;Seated Hip Flexion/Marching: Strengthening;20 reps;Both;Standing      Assessment/Plan    PT Assessment Patient needs continued PT services  PT Diagnosis Difficulty walking;Generalized weakness   PT Problem List Decreased strength;Decreased range of motion;Decreased activity tolerance;Decreased balance;Decreased mobility;Decreased knowledge of use of DME;Decreased safety awareness;Decreased knowledge of precautions  PT Treatment Interventions DME instruction;Gait training;Functional mobility training;Therapeutic activities;Therapeutic exercise;Balance training;Neuromuscular re-education;Patient/family education   PT Goals (Current goals can be found in the Care Plan section) Acute Rehab PT Goals Patient Stated Goal: Get better PT Goal Formulation: With patient Time For Goal Achievement: 05/25/14 Potential to Achieve Goals: Good    Frequency Min 3X/week   Barriers to discharge Decreased caregiver support lives alone. Has a Clinical cytogeneticist that checks on him daily    Co-evaluation               End of Session Equipment Utilized During Treatment: Gait belt;Oxygen Activity Tolerance: Patient tolerated treatment well Patient left: in chair;with call bell/phone within reach Nurse Communication: Mobility status         Time: 1106-1130 PT Time Calculation (min) (ACUTE ONLY): 24 min   Charges:   PT Evaluation $Initial PT Evaluation Tier I: 1 Procedure PT Treatments $Therapeutic Activity: 8-22 mins   PT G Codes:          Ellouise Newer 05/11/2014, 1:37 PM Camille Bal [place], [place]

## 2014-05-11 NOTE — Progress Notes (Signed)
PARENTERAL NUTRITION CONSULT NOTE - FOLLOW UP  Pharmacy Consult for TPN Indication:  Poor nutritional status, prolonged ileus  No Known Allergies  Patient Measurements: Height: 5\' 6"  (167.6 cm) Weight: 125 lb 7.1 oz (56.9 kg) IBW/kg (Calculated) : 63.8 Adjusted Body Weight:  Usual Weight:   Vital Signs: Temp: 97.4 F (36.3 C) (12/23 0749) Temp Source: Oral (12/23 0749) BP: 96/64 mmHg (12/23 0900) Pulse Rate: 113 (12/23 0900) Intake/Output from previous day: 12/22 0701 - 12/23 0700 In: 2034.9 [I.V.:1464.9; TPN:495] Out: 2325 [Urine:1675; Emesis/NG output:40; Drains:180; Chest Tube:430] Intake/Output from this shift: Total I/O In: 140 [I.V.:50; TPN:90] Out: 175 [Urine:175]  Labs:  Recent Labs  05/09/14 2020 05/10/14 0430 05/11/14 0500  WBC 5.7 6.6 6.7  HGB 12.9* 12.7* 11.4*  HCT 36.9* 35.6* 33.2*  PLT 101* 132* 150  APTT 30  --   --   INR 1.28  --   --      Recent Labs  05/09/14 0325  05/09/14 2020 05/10/14 0430 05/10/14 1028 05/11/14 0500  NA 141  < > 140 143  --  141  K 4.1  < > 4.3 4.3  --  3.8  CL 111  --  111 119*  --  115*  CO2 21  --  17* 18*  --  19  GLUCOSE 127*  --  265* 125*  --  148*  BUN 46*  --  39* 39*  --  38*  CREATININE 2.55*  --  1.91* 2.32*  --  2.31*  CALCIUM 7.7*  --  6.4* 6.9*  --  7.5*  MG  --   --   --   --  1.6 1.7  PHOS  --   --   --   --  5.4* 4.1  PROT 4.0*  --   --   --   --  4.0*  ALBUMIN 1.0*  --   --   --   --  1.7*  AST 16  --   --   --   --  43*  ALT 10  --   --   --   --  37  ALKPHOS 50  --   --   --   --  61  BILITOT <0.2*  --   --   --   --  0.7  TRIG  --   --   --   --   --  273*  < > = values in this interval not displayed. Estimated Creatinine Clearance: 25.3 mL/min (by C-G formula based on Cr of 2.31).    Recent Labs  05/11/14 0006 05/11/14 0413 05/11/14 0748  GLUCAP 148* 120* 130*    Medications:  Scheduled:  . antiseptic oral rinse  7 mL Mouth Rinse q12n4p  . chlorhexidine  15 mL Mouth Rinse  BID  . insulin aspart  0-15 Units Subcutaneous 6 times per day    Admit:  Admitted with RUQ abd pain  Insulin Requirements in the past 24 hours:  Nutritional Goals:  1900-2100 kCal, 85-100 grams of protein per day Clinimix E 5/20 @ 75 ml/hr with 20% lipids @ 103ml/hr will provide 90 gm protein and 2064 Kcal/day  Current Nutrition:  NPO  Assessment: 68 yo man to start TPN for NPO status and poor nutritional status. Ca X phos <55 so will add lytes to TPN.Keeping close watch on lytes as may be at risk for refeeding, which are OK this AM.  GI:  Recurrent GI bleeding s/p duodenotomy  and JP tube.  Wants NGT removed, no blood evident in tube this AM.  Plan for UGI on day#5.  TG 273.  Ice chips.  Protonix drip  Endo: No hx DM.  Moderate SSI, cbgs 120-148  Lytes:  K 3.8, Na 141, Mg & Phos wnl , CoCa 9.34  Renal:  CRI.  Cr 2.31- stable.  UOP 1.9ml/kg/hr  Pulm:  S/P VATS/decort of empyema with talc pleurodesis.  [place]-5L   Cards: BP soft, off pressors.  Hepatobil: Alb low, LFTs wnl , INR 1.28  Neuro:  Awake  ID: Zosyn post VATS, HIV meds currently on hold.  WBC wnl, AFeb  Best Practices: scd's, ppi TPN Access: 12/21 R IJ CVC TPN day#:0  Plan:  Advance Clinimix E 5/15 to 65 ml/hr Increase Lipids to 39ml/hr F/U am labs.  Gracy Bruins, PharmD Clinical Pharmacist [facility]

## 2014-05-11 NOTE — Progress Notes (Signed)
2 Days Post-Op Procedure(s) (LRB): EXPLORATORY LAPAROTOMY (N/A) DUODENOTOMY with Primary ligation Bleeding Duodenal Ulcer (N/A) Subjective: Looks stronger norepi off No air leak from chest tubes- will DC anterior tube Sig drainage of serosanguinous fluid from posterior tube- will leave to water seal OOB to chair today  Objective: Vital signs in last 24 hours: Temp:  [97.3 F (36.3 C)-98.2 F (36.8 C)] 97.4 F (36.3 C) (12/23 0749) Pulse Rate:  [71-127] 118 (12/23 0600) Cardiac Rhythm:  [-] Sinus tachycardia (12/23 0400) Resp:  [8-32] 24 (12/23 0700) BP: (80-100)/(44-68) 95/68 mmHg (12/23 0700) SpO2:  [81 %-97 %] 88 % (12/23 0600) FiO2 (%):  [40 %] 40 % (12/22 1000)  Hemodynamic parameters for last 24 hours: CVP:  [10 mmHg] 10 mmHg  Intake/Output from previous day: 12/22 0701 - 12/23 0700 In: 2034.9 [I.V.:1464.9; TPN:495] Out: 2325 [Urine:1675; Emesis/NG output:40; Drains:180; Chest Tube:430] Intake/Output this shift:   NSR Coarse breath sounds on R  Lab Results:  Recent Labs  05/10/14 0430 05/11/14 0500  WBC 6.6 6.7  HGB 12.7* 11.4*  HCT 35.6* 33.2*  PLT 132* 150   BMET:  Recent Labs  05/10/14 0430 05/11/14 0500  NA 143 141  K 4.3 3.8  CL 119* 115*  CO2 18* 19  GLUCOSE 125* 148*  BUN 39* 38*  CREATININE 2.32* 2.31*  CALCIUM 6.9* 7.5*    PT/INR:  Recent Labs  05/09/14 2020  LABPROT 16.1*  INR 1.28   ABG    Component Value Date/Time   PHART 7.273* 05/09/2014 1818   HCO3 20.0 05/09/2014 1818   TCO2 21 05/09/2014 1818   ACIDBASEDEF 7.0* 05/09/2014 1818   O2SAT 100.0 05/09/2014 1818   CBG (last 3)   Recent Labs  05/10/14 1915 05/11/14 0006 05/11/14 0413  GLUCAP 124* 148* 120*    Assessment/Plan: S/P Procedure(s) (LRB): EXPLORATORY LAPAROTOMY (N/A) DUODENOTOMY with Primary ligation Bleeding Duodenal Ulcer (N/A) DC ant chest tube Leave in ICU another day   LOS: 13 days    VAN TRIGT III,PETER 05/11/2014

## 2014-05-11 NOTE — Progress Notes (Signed)
Rehab Admissions Coordinator Note:  Patient was screened by Cleatrice Burke for appropriateness for an Inpatient Acute Rehab Consult per PT recommendation.  At this time, we are recommending Inpatient Rehab consult If pt fails to progress over the next few days.   Cleatrice Burke 05/11/2014, 2:07 PM  I can be reached at [phone].

## 2014-05-11 NOTE — Progress Notes (Signed)
PULMONARY / CRITICAL CARE MEDICINE   Name: [name] [name] MRN: [id number] DOB: 1945-09-15    ADMISSION DATE:  04/28/2014 CONSULTATION DATE:  04/28/2014  REFERRING MD :  Nils Pyle  CHIEF COMPLAINT:  S/P VATS  INITIAL PRESENTATION: 68 y/o male with HIV followed by Dr. [name] for an exudative effusion was admitted on 12/10 for pain in his abdomen and recurrence of the pleural effusion which had been sampled just three days prior.    SIGNIFICANT EVENTS/STUDIES:  12/7 Outpatient thoracentesis by IR> exudative, cytology negative 12/10 abdominal ultrasound> 13 mm gallstone, mild tenderness over gallbladder without gallbladder thickening, possible left upper pole renal calculus, right pleural effusion noted 12/10 renal ultrasound> no hydronephrosis 12/11 HIDA scan> patent cystic duct without evidence of acute cholecystitis 12/11 Echo> LVEF 65%, RV normal. 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 12/19: no distress. Still has airleak, but think that this is possibly d/t ant CT being out of place  12/21 Recurrent GI bleed with shock. Transfusion of multiple units of RBCs. GI re-eval. CCS consult. 12/21 Emergent laparotomy: ligation of bleeding duodenal ulcer. Returned to ICU intubated and on vasopressors 12/22 Weaned off vasopressors. Extubated  SUBJECTIVE:  Has tolerated extubation. No new complaints  VITAL SIGNS: Temp:  [97.3 F (36.3 C)-98 F (36.7 C)] 97.4 F (36.3 C) (12/23 1149) Pulse Rate:  [108-127] 117 (12/23 1355) Resp:  [6-38] 19 (12/23 1355) BP: (74-100)/(47-76) 100/76 mmHg (12/23 1330) SpO2:  [81 %-99 %] 93 % (12/23 1355) 2 liters     VENTILATOR SETTINGS:   INTAKE / OUTPUT:  Intake/Output Summary (Last 24 hours) at 05/11/14 1439 Last data filed at 05/11/14 1400  Gross per 24 hour  Intake   1585 ml  Output   2295 ml  Net   -710  ml    PHYSICAL EXAMINATION: General:  NAD Neuro:  No focal deficitsl HEENT:  /AT, No JVD noted Cardiovascular:  RRR, no M Lungs: clear Abdomen:  Soft, mildly distended, 0 BS Ext: coll, no edema    LABS: I have reviewed all of today's lab results. Relevant abnormalities are discussed in the A/P section  CXR: [facility]  ASSESSMENT / PLAN:  PULMONARY A:  Newly diagnosed RLL cancer -  [place] + for Ca. Final path pending Status post VATS 12/17, talc pleurodesis VDRF post laparotomy, resolved P:   Supp O2 ICU monitoring TCTS managing chest tubes F/U path when available  CARDIOVASCULAR R IJ CVL 12/21 >>  A:  Hemorrhagic shock, resolved P:  ICU monitoring   RENAL A:   CKD Recurrent AKI due to shock - nonoliguric, Cr stable P:   Monitor BMET intermittently Monitor I/Os Correct electrolytes as indicated  GASTROINTESTINAL A:   Recurrent UGIB S/P laparotomy 12/21 Anticipated prolonged ileus P:   Continue PPI TPN ordered 12/22  HEMATOLOGIC A:  Acute blood loss anemia P:  DVT px: SCDs Monitor CBC intermittently Transfuse per usual ICU guidelines  INFECTIOUS A:   HIV  P:   Micro and abx reviewed Resume anti-retroviral meds when taking POs  ENDOCRINE A:  TPN associated hyperglycemia P:   SSI while on TPN  NEUROLOGIC A:   Postoperative pain  P:   Cont PRN fentanyl  Watch in ICU X 24 hrs more  Merton Border, MD ; [facility] service Mobile [phone].  After 5:30 PM or weekends, call [phone]

## 2014-05-12 ENCOUNTER — Inpatient Hospital Stay (HOSPITAL_COMMUNITY): Payer: Commercial Managed Care - HMO

## 2014-05-12 DIAGNOSIS — K922 Gastrointestinal hemorrhage, unspecified: Secondary | ICD-10-CM | POA: Insufficient documentation

## 2014-05-12 LAB — GLUCOSE, CAPILLARY
Glucose-Capillary: 157 mg/dL — ABNORMAL HIGH (ref 70–99)
Glucose-Capillary: 131 mg/dL — ABNORMAL HIGH (ref 70–99)
Glucose-Capillary: 157 mg/dL — ABNORMAL HIGH (ref 70–99)
Glucose-Capillary: 152 mg/dL — ABNORMAL HIGH (ref 70–99)
Glucose-Capillary: 97 mg/dL (ref 70–99)
Glucose-Capillary: 118 mg/dL — ABNORMAL HIGH (ref 70–99)

## 2014-05-12 LAB — COMPREHENSIVE METABOLIC PANEL
ALBUMIN: 1.6 g/dL — AB (ref 3.5–5.2)
ALT: 30 U/L (ref 0–53)
AST: 35 U/L (ref 0–37)
Alkaline Phosphatase: 76 U/L (ref 39–117)
Anion gap: 6 (ref 5–15)
BUN: 41 mg/dL — AB (ref 6–23)
CALCIUM: 8.1 mg/dL — AB (ref 8.4–10.5)
CO2: 23 mmol/L (ref 19–32)
CREATININE: 2.35 mg/dL — AB (ref 0.50–1.35)
Chloride: 116 mEq/L — ABNORMAL HIGH (ref 96–112)
GFR calc non Af Amer: 27 mL/min — ABNORMAL LOW (ref >90)
GFR, EST AFRICAN AMERICAN: 32 mL/min — AB (ref >90)
GLUCOSE: 94 mg/dL (ref 70–99)
POTASSIUM: 4.3 mmol/L (ref 3.5–5.1)
Sodium: 145 mmol/L (ref 135–145)
Total Bilirubin: 0.5 mg/dL (ref 0.3–1.2)
Total Protein: 4.4 g/dL — ABNORMAL LOW (ref 6.0–8.3)

## 2014-05-12 LAB — PHOSPHORUS: PHOSPHORUS: 4.2 mg/dL (ref 2.3–4.6)

## 2014-05-12 LAB — CBC
HEMATOCRIT: 34 % — AB (ref 39.0–52.0)
Hemoglobin: 11.4 g/dL — ABNORMAL LOW (ref 13.0–17.0)
MCH: 29.7 pg (ref 26.0–34.0)
MCHC: 33.5 g/dL (ref 30.0–36.0)
MCV: 88.5 fL (ref 78.0–100.0)
PLATELETS: 184 10*3/uL (ref 150–400)
RBC: 3.84 MIL/uL — ABNORMAL LOW (ref 4.22–5.81)
RDW: 16.7 % — AB (ref 11.5–15.5)
WBC: 7.4 10*3/uL (ref 4.0–10.5)

## 2014-05-12 LAB — MAGNESIUM: MAGNESIUM: 1.9 mg/dL (ref 1.5–2.5)

## 2014-05-12 MED ORDER — TRACE MINERALS CR-CU-F-FE-I-MN-MO-SE-ZN IV SOLN
INTRAVENOUS | Status: AC
  Administered 2014-05-12: 18:00:00 via INTRAVENOUS
  Filled 2014-05-12: qty 2000

## 2014-05-12 MED ORDER — LEVALBUTEROL HCL 0.63 MG/3ML IN NEBU
0.6300 mg | INHALATION_SOLUTION | RESPIRATORY_TRACT | Status: DC | PRN
  Administered 2014-05-13 – 2014-05-17 (×5): 0.63 mg via RESPIRATORY_TRACT
  Filled 2014-05-12 (×4): qty 3

## 2014-05-12 MED ORDER — FUROSEMIDE 10 MG/ML IJ SOLN
40.0000 mg | Freq: Once | INTRAMUSCULAR | Status: AC
  Administered 2014-05-12: 40 mg via INTRAVENOUS
  Filled 2014-05-12: qty 4

## 2014-05-12 MED ORDER — FAT EMULSION 20 % IV EMUL
250.0000 mL | INTRAVENOUS | Status: AC
  Administered 2014-05-12: 250 mL via INTRAVENOUS
  Filled 2014-05-12: qty 250

## 2014-05-12 NOTE — Progress Notes (Addendum)
TCTS DAILY ICU PROGRESS NOTE                   [address]            [place] [address]          [phone]   3 Days Post-Op Procedure(s) (LRB): EXPLORATORY LAPAROTOMY (N/A) DUODENOTOMY with Primary ligation Bleeding Duodenal Ulcer (N/A)  Total Length of Stay:  LOS: 14 days   Subjective: Comfortable, no new complaints.  Breathing stable. Pain controlled.   Objective: Vital signs in last 24 hours: Temp:  [97.4 F (36.3 C)-98.2 F (36.8 C)] 97.7 F (36.5 C) (12/24 0800) Pulse Rate:  [107-127] 116 (12/24 0700) Cardiac Rhythm:  [-] Sinus tachycardia (12/24 0700) Resp:  [6-38] 22 (12/24 0700) BP: (73-107)/(44-76) 106/61 mmHg (12/24 0700) SpO2:  [81 %-99 %] 94 % (12/24 0700)  Filed Weights   05/03/14 2205 05/05/14 1830 05/09/14 0530  Weight: 125 lb 4.8 oz (56.836 kg) 122 lb 5.7 oz (55.5 kg) 125 lb 7.1 oz (56.9 kg)    Weight change:    Hemodynamic parameters for last 24 hours:    Intake/Output from previous day: 12/23 0701 - 12/24 0700 In: 2228.3 [I.V.:730; TPN:1498.3] Out: 5330 [Urine:4900; Drains:50; Chest Tube:380]  Intake/Output this shift: Total I/O In: 100 [I.V.:25; TPN:75] Out: 430 [Urine:400; Drains:30]  Current Meds: Scheduled Meds: . antiseptic oral rinse  7 mL Mouth Rinse q12n4p  . chlorhexidine  15 mL Mouth Rinse BID  . insulin aspart  0-15 Units Subcutaneous 6 times per day   Continuous Infusions: . Marland KitchenTPN (CLINIMIX-E) Adult 65 mL/hr at 05/12/14 0800   And  . fat emulsion 480 kcal (05/12/14 0800)  . pantoprozole (PROTONIX) infusion 8 mg/hr (05/12/14 0800)   PRN Meds:.sodium chloride, fentaNYL, levalbuterol, ondansetron (ZOFRAN) IV  Physical Exam: General appearance: alert, cooperative and no distress Heart: regular rate and rhythm Lungs: Decreased BS in bases Wound: Dressed and dry  Chest tube: No air leak   Lab Results: CBC: Recent Labs  05/11/14 0500 05/12/14 0214  WBC 6.7 7.4  HGB 11.4* 11.4*  HCT 33.2* 34.0*    PLT 150 184   BMET:  Recent Labs  05/11/14 0500 05/12/14 0214  NA 141 145  K 3.8 4.3  CL 115* 116*  CO2 19 23  GLUCOSE 148* 94  BUN 38* 41*  CREATININE 2.31* 2.35*  CALCIUM 7.5* 8.1*    PT/INR:  Recent Labs  05/09/14 2020  LABPROT 16.1*  INR 1.28   Radiology: Dg Chest Port 1 View  05/12/2014   CLINICAL DATA:  Shortness of breath.  EXAM: PORTABLE CHEST - 1 VIEW  COMPARISON:  05/11/2014.  FINDINGS: Interim removal of NG tube. Right IJ line and right lower chest tube in stable position. The upper chest tube is been removed. Mediastinum and hilar structures are normal. Heart size normal. Persistent loculated right base pneumothorax. No change. Persistent atelectasis and/or infiltrate right lower lobe. No acute bony abnormality.  IMPRESSION: 1. Interim removal of NG tube and right upper chest tube. Right IJ line and right lower chest tube in stable position. 2. Persistent loculated right base pneumothorax. 3. Persistent atelectasis and/or infiltrate right lower lobe.   Electronically Signed   By: Marcello Moores  Register   On: 05/12/2014 07:17   Dg Chest Port 1 View  05/11/2014   CLINICAL DATA:  Shortness of breath, weakness  EXAM: PORTABLE CHEST - 1 VIEW  COMPARISON:  05/10/2014  FINDINGS: Cardiomediastinal silhouette is stable. Endotracheal tube  has been removed. Stable NG tube position. Stable dual lumen right IJ catheter position. Again noted right lower lobe atelectasis or infiltrate. Small loculated pneumothorax right basilar. Stable right lower chest tubes position. No pulmonary edema. Small left pleural effusion with left basilar atelectasis.  IMPRESSION: Stable right chest tubes position. Small loculated right basilar pneumothorax. Again noted right lower lobe atelectasis or infiltrate. Stable right pleural thickening. Endotracheal tube has been removed. No pneumothorax.   Electronically Signed   By: Lahoma Crocker M.D.   On: 05/11/2014 07:55     Assessment/Plan: S/P Procedure(s)  (LRB): EXPLORATORY LAPAROTOMY (N/A) DUODENOTOMY with Primary ligation Bleeding Duodenal Ulcer (N/A) Stable from thoracic surgery standpoint. CT output 380 ml over past 24 hours, only 40 ml yet today.  Continue CT to water seal for now. Hopefully if drainage decreases, can d/c remaining CT soon. Continue current care per CCS, CCM.  COLLINS,GINA H 05/12/2014 8:39 AM  patient examined and medical record reviewed,agree with above note. We will remove remaining chest tube when drainage over 24 hours is less than 100 cc Patient has a subpulmonic space because of entrapped right lower lobe from tumor and will reaccumulate some effusion but this should not compress the middle and upper lobe following VATS biopsy and talc pleurodesis  Final pathology on lung tumor is pending. 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

## 2014-05-12 NOTE — Progress Notes (Signed)
3 Days Post-Op  Subjective: Denies n/v. No flatus. Some burping. Pain ok.   Objective: Vital signs in last 24 hours: Temp:  [97.4 F (36.3 C)-98.2 F (36.8 C)] 98.2 F (36.8 C) (12/24 0418) Pulse Rate:  [107-127] 116 (12/24 0600) Resp:  [6-38] 26 (12/24 0600) BP: (73-107)/(44-76) 98/64 mmHg (12/24 0600) SpO2:  [81 %-99 %] 93 % (12/24 0600) Last BM Date: 05/09/14  Intake/Output from previous day: 12/23 0701 - 12/24 0700 In: 2118.3 [I.V.:695; TPN:1423.3] Out: 5270 [Urine:4840; Drains:50; Chest Tube:380] Intake/Output this shift: Total I/O In: 1210 [I.V.:385; TPN:825] Out: 2970 [Urine:2850; Drains:20; Chest Tube:100]  Alert, nontoxic, not ill appearing Some distension, open wound - fascia intact. JP - serosang. Not really tender  Lab Results:   Recent Labs  05/11/14 0500 05/12/14 0214  WBC 6.7 7.4  HGB 11.4* 11.4*  HCT 33.2* 34.0*  PLT 150 184   BMET  Recent Labs  05/11/14 0500 05/12/14 0214  NA 141 145  K 3.8 4.3  CL 115* 116*  CO2 19 23  GLUCOSE 148* 94  BUN 38* 41*  CREATININE 2.31* 2.35*  CALCIUM 7.5* 8.1*   PT/INR  Recent Labs  05/09/14 2020  LABPROT 16.1*  INR 1.28   ABG  Recent Labs  05/09/14 1615 05/09/14 1818  PHART 7.160* 7.273*  HCO3 12.9* 20.0    Studies/Results: Dg Chest Port 1 View  05/11/2014   CLINICAL DATA:  Shortness of breath, weakness  EXAM: PORTABLE CHEST - 1 VIEW  COMPARISON:  05/10/2014  FINDINGS: Cardiomediastinal silhouette is stable. Endotracheal tube has been removed. Stable NG tube position. Stable dual lumen right IJ catheter position. Again noted right lower lobe atelectasis or infiltrate. Small loculated pneumothorax right basilar. Stable right lower chest tubes position. No pulmonary edema. Small left pleural effusion with left basilar atelectasis.  IMPRESSION: Stable right chest tubes position. Small loculated right basilar pneumothorax. Again noted right lower lobe atelectasis or infiltrate. Stable right pleural  thickening. Endotracheal tube has been removed. No pneumothorax.   Electronically Signed   By: Lahoma Crocker M.D.   On: 05/11/2014 07:55    Anti-infectives: Anti-infectives    Start     Dose/Rate Route Frequency Ordered Stop   05/09/14 1815  cefoTEtan (CEFOTAN) 1 g in dextrose 5 % 50 mL IVPB     1 g100 mL/hr over 30 Minutes Intravenous  Once 05/09/14 1805 05/09/14 1720   05/05/14 1900  piperacillin-tazobactam (ZOSYN) IVPB 3.375 g     3.375 g12.5 mL/hr over 240 Minutes Intravenous Every 8 hours 05/05/14 1533 05/08/14 1520   05/05/14 0600  cefUROXime (ZINACEF) 1.5 g in dextrose 5 % 50 mL IVPB     1.5 g100 mL/hr over 30 Minutes Intravenous On call to O.R. 05/04/14 1106 05/05/14 1345   05/03/14 0800  darunavir-cobicistat (PREZCOBIX) 800-150 MG per tablet 1 tablet  Status:  Discontinued     1 tablet Oral Daily with breakfast 05/02/14 1404 05/09/14 2051   05/01/14 1000  lamiVUDine (EPIVIR) 10 MG/ML solution 100 mg  Status:  Discontinued     100 mg Oral Daily 04/30/14 1354 05/09/14 2051   04/30/14 1000  zidovudine (RETROVIR) capsule 300 mg  Status:  Discontinued     300 mg Oral Every 12 hours 04/30/14 0922 05/09/14 2051   04/29/14 1800  atazanavir-cobicistat (Evotaz) 300-150 MG per tablet 1 tablet  Status:  Discontinued     1 tablet Oral Daily with breakfast 04/29/14 1408 05/02/14 1404   04/29/14 1000  cefTRIAXone (ROCEPHIN) 1 g  in dextrose 5 % 50 mL IVPB - Premix  Status:  Discontinued     1 g100 mL/hr over 30 Minutes Intravenous Every 24 hours 04/29/14 0856 05/03/14 1214   04/28/14 1615  lamiVUDine (EPIVIR) tablet 150 mg  Status:  Discontinued     150 mg Oral Daily 04/28/14 1454 04/30/14 1354   04/28/14 1600  zidovudine (RETROVIR) tablet 300 mg  Status:  Discontinued     300 mg Oral 2 times daily 04/28/14 1454 04/30/14 0921      Assessment/Plan: s/p Procedure(s): EXPLORATORY LAPAROTOMY (N/A) DUODENOTOMY with Primary ligation Bleeding Duodenal Ulcer (N/A)  No change in diet status  today Mobilize, OOB Cont w-d dressing to midline hgb stable Will need UGI on POD 5 (sat)  Leighton Ruff. Redmond Pulling, MD, FACS General, Bariatric, & Minimally Invasive Surgery [facility], [place]   LOS: 14 days    Gayland Curry 05/12/2014

## 2014-05-12 NOTE — Progress Notes (Signed)
PULMONARY / CRITICAL CARE MEDICINE   Name: [name] [name] MRN: [id number] DOB: [birth year]    ADMISSION DATE:  04/28/2014 CONSULTATION DATE:  04/28/2014  REFERRING MD :  Nils Pyle  CHIEF COMPLAINT:  S/P VATS  INITIAL PRESENTATION:  68 y/o male with HIV followed by Dr. [name] for an exudative effusion was admitted on 12/10 for pain in his abdomen and recurrence of the pleural effusion which had been sampled just three days prior.    SIGNIFICANT EVENTS: 12/17 VATS 12/19 no distress. Still has airleak, but think that this is possibly d/t ant CT being out of place 12/21 Recurrent GI bleed with shock. Transfusion of multiple units of RBCs. GI re-eval. CCS consult. 12/21 Emergent laparotomy: ligation of bleeding duodenal ulcer. Returned to ICU intubated and on vasopressors 12/22 Weaned off vasopressors. Extubated  STUDIES:  12/7 Outpatient thoracentesis by IR> exudative, cytology negative 12/10 abdominal ultrasound> 13 mm gallstone, mild tenderness over gallbladder without gallbladder thickening, possible left upper pole renal calculus, right pleural effusion noted 12/10 renal ultrasound> no hydronephrosis 12/11 HIDA scan> patent cystic duct without evidence of acute cholecystitis 12/11 Echo> LVEF 65%, RV normal. 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  Abdominal pain improved.  VITAL SIGNS: Temp:  [97.4 F (36.3 C)-98.2 F (36.8 C)] 97.7 F (36.5 C) (12/24 0800) Pulse Rate:  [107-127] 125 (12/24 0800) Resp:  [6-38] 24 (12/24 0800) BP: (73-107)/(44-76) 106/61 mmHg (12/24 0700) SpO2:  [81 %-99 %] 91 % (12/24 0800) INTAKE / OUTPUT:  Intake/Output Summary (Last 24 hours) at 05/12/14 0907 Last data filed at 05/12/14 0800  Gross per 24 hour  Intake 2198.33 ml  Output   5585 ml  Net -3386.67 ml     PHYSICAL EXAMINATION: General:  pleasant Neuro: normal strength HEENT:  IJ CVL site clean Cardiovascular: regular Lungs: Rt chest tube in place Abdomen: wound site clean Ext: no edema   LABS: CBC Recent Labs     05/10/14  0430  05/11/14  0500  05/12/14  0214  WBC  6.6  6.7  7.4  HGB  12.7*  11.4*  11.4*  HCT  35.6*  33.2*  34.0*  PLT  132*  150  184    Coag's Recent Labs     05/09/14  2020  APTT  30  INR  1.28    BMET Recent Labs     05/10/14  0430  05/11/14  0500  05/12/14  0214  NA  143  141  145  K  4.3  3.8  4.3  CL  119*  115*  116*  CO2  18*  19  23  BUN  39*  38*  41*  CREATININE  2.32*  2.31*  2.35*  GLUCOSE  125*  148*  94    Electrolytes Recent Labs     05/10/14  0430  05/10/14  1028  05/11/14  0500  05/12/14  0214  CALCIUM  6.9*   --   7.5*  8.1*  MG   --   1.6  1.7  1.9  PHOS   --   5.4*  4.1  4.2   ABG Recent Labs     05/09/14  1615  05/09/14  1818  PHART  7.160*  7.273*  PCO2ART  37.7  42.4  PO2ART  382.0*  391.0*    Liver Enzymes Recent Labs  05/11/14  0500  05/12/14  0214  AST  43*  35  ALT  37  30  ALKPHOS  61  76  BILITOT  0.7  0.5  ALBUMIN  1.7*  1.6*   Glucose Recent Labs     05/11/14  1144  05/11/14  1506  05/11/14  1902  05/11/14  2353  05/12/14  0420  05/12/14  0743  GLUCAP  125*  111*  132*  157*  131*  157*    Imaging Dg Chest Port 1 View  05/12/2014   CLINICAL DATA:  Shortness of breath.  EXAM: PORTABLE CHEST - 1 VIEW  COMPARISON:  05/11/2014.  FINDINGS: Interim removal of NG tube. Right IJ line and right lower chest tube in stable position. The upper chest tube is been removed. Mediastinum and hilar structures are normal. Heart size normal. Persistent loculated right base pneumothorax. No change. Persistent atelectasis and/or infiltrate right lower lobe. No acute bony abnormality.  IMPRESSION: 1. Interim removal of NG tube and right upper chest tube. Right IJ line and right lower chest  tube in stable position. 2. Persistent loculated right base pneumothorax. 3. Persistent atelectasis and/or infiltrate right lower lobe.   Electronically Signed   By: Marcello Moores  Register   On: 05/12/2014 07:17   Dg Chest Port 1 View  05/11/2014   CLINICAL DATA:  Shortness of breath, weakness  EXAM: PORTABLE CHEST - 1 VIEW  COMPARISON:  05/10/2014  FINDINGS: Cardiomediastinal silhouette is stable. Endotracheal tube has been removed. Stable NG tube position. Stable dual lumen right IJ catheter position. Again noted right lower lobe atelectasis or infiltrate. Small loculated pneumothorax right basilar. Stable right lower chest tubes position. No pulmonary edema. Small left pleural effusion with left basilar atelectasis.  IMPRESSION: Stable right chest tubes position. Small loculated right basilar pneumothorax. Again noted right lower lobe atelectasis or infiltrate. Stable right pleural thickening. Endotracheal tube has been removed. No pneumothorax.   Electronically Signed   By: Lahoma Crocker M.D.   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 Acute respiratory failure after surgery >> resolved 12/23. P:   Chest tube, post-op care per TCTS F/u CXR Bronchial hygiene Oxygen to keep SpO2 > 92%  CARDIOVASCULAR R IJ CVL 12/21 >>  A:  Hemorrhagic shock 2nd to GI bleed >> resolved. P:  Monitor hemodynamics  RENAL A:   AKI 2nd to GI bleed. Stage 3 CKD. P:   Monitor renal fx, urine outpt, electrolytes  GASTROINTESTINAL A:   Bleeding duodenal ulcer s/p laparotomy 12/21. P:   Post-op care per CCS ?if he can transition off protonix infusion to BID protonix >> defer to CCS TNA for nutrition >> advance diet per CCS  HEMATOLOGIC A:  Acute blood loss anemia 2nd to GI bleed >> no further bleeding. P:  F/u CBC SCD for DVT prevention  INFECTIOUS A:   Hx of HIV. P:   Resume evotaz, epivir,  retrovir when able to take oral meds  ENDOCRINE A:  TPN associated hyperglycemia P:   SSI while on TPN  NEUROLOGIC A:   Postoperative pain. P:   Cont PRN fentanyl  Transfer to SDU 12/24.  Will ask [facility] to assume care 12/25 and PCCM sign off.  Updated family at bedside.  Chesley Mires, MD [facility] Pulmonary/Critical Care 05/12/2014, 9:17 AM Pager:  [phone] After 3pm call: [phone]

## 2014-05-12 NOTE — Progress Notes (Signed)
Physical Therapy Treatment Patient Details Name: [name] [name] MRN: [id number] DOB: 12-11-1945 Today's Date: 05/12/2014    History of Present Illness 68 y/o male with HIV followed by Dr. [name] for an exudative effusion was admitted on 12/10 for pain in his abdomen and recurrence of the pleural effusion which had been sampled just three days prior. s/p VATS for lung biopsy 12/17. s/p EXPLORATORY LAPAROTOMY and DUODENOTOMY with Primary ligation Bleeding Duodenal Ulcer  12/21.    PT Comments    Progressing towards physical therapy goals, ambulating up to 65 feet with min assist +2 while using a rolling walker for stability. On 4L supplemental O2 sats down to 87% requiring 6L to return to >92%. Overall pt appears and reports greater fatigue today. Tolerating exercises well and states he has been performing various LE exercises on his own. Patient will continue to benefit from skilled physical therapy services to further improve independence with functional mobility.   Follow Up Recommendations  CIR     Equipment Recommendations   (TBD based on progression)    Recommendations for Other Services Rehab consult;OT consult     Precautions / Restrictions Precautions Precautions: Other (comment);Fall (chest tube) Precaution Comments: chest tube Restrictions Weight Bearing Restrictions: No    Mobility  Bed Mobility Overal bed mobility: Needs Assistance Bed Mobility: Supine to Sit     Supine to sit: Min guard;+2 for safety/equipment     General bed mobility comments: min guard for safety. VC for technique. Less impulsive today but still needs cues to wait until lines/leads are safely positioned.  Transfers Overall transfer level: Needs assistance Equipment used: Rolling walker (2 wheeled) Transfers: Sit to/from Stand Sit to Stand: Min guard         General transfer comment: Min guard for safety. Slower to rise today. increased dyspnea SpO2 87% on 4L supplemental O2 HR  132.  Ambulation/Gait Ambulation/Gait assistance: Min assist;+2 safety/equipment Ambulation Distance (Feet): 65 Feet Assistive device: Rolling walker (2 wheeled) Gait Pattern/deviations: Step-through pattern;Decreased stride length;Shuffle;Trunk flexed Gait velocity: decreased Gait velocity interpretation: Below normal speed for age/gender General Gait Details: Educated on safe DME use with a rolling walker. +2 assist for equipment.  Min assist for walker control intermittently with VC for upright posture and greater step length. 2/4 dyspnea, SpO2 100% on 6L at one point be difficulty reading due to movement and poor signal. HR highest at 131 while ambulating.   Stairs            Wheelchair Mobility    Modified Rankin (Stroke Patients Only)       Balance                                    Cognition Arousal/Alertness: Awake/alert Behavior During Therapy: Impulsive Overall Cognitive Status: Within Functional Limits for tasks assessed                      Exercises General Exercises - Lower Extremity Ankle Circles/Pumps: AROM;Both;10 reps;Seated Quad Sets: Strengthening;Both;10 reps;Seated Long Arc Quad: Strengthening;Both;10 reps;Seated Heel Slides: Strengthening;Both;10 reps;Seated Hip Flexion/Marching: Strengthening;Both;10 reps;Standing    General Comments General comments (skin integrity, edema, etc.): Overall, pt seems more fatigued today even with bed mobility tasks.      Pertinent Vitals/Pain Pain Assessment: No/denies pain    Home Living  Prior Function            PT Goals (current goals can now be found in the care plan section) Acute Rehab PT Goals Patient Stated Goal: Get better PT Goal Formulation: With patient Time For Goal Achievement: 05/25/14 Potential to Achieve Goals: Good Progress towards PT goals: Progressing toward goals    Frequency  Min 3X/week    PT Plan Current plan remains  appropriate    Co-evaluation             End of Session Equipment Utilized During Treatment: Gait belt;Oxygen Activity Tolerance: Patient limited by fatigue Patient left: in chair;with call bell/phone within reach     Time: 0511-0211 PT Time Calculation (min) (ACUTE ONLY): 33 min  Charges:  $Gait Training: 8-22 mins $Therapeutic Exercise: 8-22 mins                    G Codes:      Ellouise Newer 2014-05-27, 1:54 PM Camille [place], [place]

## 2014-05-12 NOTE — Consult Note (Signed)
Met with the patient at bedside. Discussed [facility] Care Management services as a benefit of his [employer] membership.  Patient agreed to services for community nurse follow up and transition of care.  Patient is not sure of his disposition as he lives alone.  He has a niece, [name] [name]  that is his next of kin for emergency contact. Consent form signed for services to start after hospital discharge.  Patient will receive telephone calls and be assessed for home visits by the community care manager or social worker.  [facility] Care Management services does not replace or interfere with any services that is set up through the inpatient care management department.  For any questions, please call Natividad Brood, RN, BSN, [facility] Liaison at [phone].

## 2014-05-12 NOTE — Progress Notes (Signed)
eLink Physician-Brief Progress Note Patient Name: Jemari Hallum DOB: 1946-04-12 MRN: [id number]   Date of Service  05/12/2014  HPI/Events of Note  Co[place] rales On TNA UO tapering  eICU Interventions  Lasix 40 x 1     Intervention Category Intermediate Interventions: Respiratory distress - evaluation and management  ALVA,RAKESH V. 05/12/2014, 12:40 AM

## 2014-05-12 NOTE — Progress Notes (Addendum)
PARENTERAL NUTRITION CONSULT NOTE - FOLLOW UP  Pharmacy Consult for TPN Indication: Prolonged ileus  No Known Allergies  Patient Measurements: Height: 5\' 6"  (167.6 cm) Weight: 125 lb 7.1 oz (56.9 kg) IBW/kg (Calculated) : 63.8 Adjusted Body Weight:  Usual Weight:   Vital Signs: Temp: 97.7 F (36.5 C) (12/24 0800) Temp Source: Axillary (12/24 0418) BP: 106/61 mmHg (12/24 0700) Pulse Rate: 125 (12/24 0800) Intake/Output from previous day: 12/23 0701 - 12/24 0700 In: 2228.3 [I.V.:730; TPN:1498.3] Out: 5330 [Urine:4900; Drains:50; Chest Tube:380] Intake/Output from this shift: Total I/O In: 110 [I.V.:35; TPN:75] Out: 430 [Urine:400; Drains:30]  Labs:  Recent Labs  05/09/14 2020 05/10/14 0430 05/11/14 0500 05/12/14 0214  WBC 5.7 6.6 6.7 7.4  HGB 12.9* 12.7* 11.4* 11.4*  HCT 36.9* 35.6* 33.2* 34.0*  PLT 101* 132* 150 184  APTT 30  --   --   --   INR 1.28  --   --   --      Recent Labs  05/10/14 0430 05/10/14 1028 05/11/14 0500 05/12/14 0214  NA 143  --  141 145  K 4.3  --  3.8 4.3  CL 119*  --  115* 116*  CO2 18*  --  19 23  GLUCOSE 125*  --  148* 94  BUN 39*  --  38* 41*  CREATININE 2.32*  --  2.31* 2.35*  CALCIUM 6.9*  --  7.5* 8.1*  MG  --  1.6 1.7 1.9  PHOS  --  5.4* 4.1 4.2  PROT  --   --  4.0* 4.4*  ALBUMIN  --   --  1.7* 1.6*  AST  --   --  43* 35  ALT  --   --  37 30  ALKPHOS  --   --  61 76  BILITOT  --   --  0.7 0.5  PREALBUMIN  --   --  9.7*  --   TRIG  --   --  273*  --    Estimated Creatinine Clearance: 24.9 mL/min (by C-G formula based on Cr of 2.35).    Recent Labs  05/11/14 2353 05/12/14 0420 05/12/14 0743  GLUCAP 157* 131* 157*    Medications:  Scheduled:  . antiseptic oral rinse  7 mL Mouth Rinse q12n4p  . chlorhexidine  15 mL Mouth Rinse BID  . insulin aspart  0-15 Units Subcutaneous 6 times per day    Admit:  Admitted with RUQ abd pain  Insulin Requirements in the past 24 hours: 10 units Mod SSI, no insulin  in TPN  Nutritional Goals:  1900-2100 kCal, 85-100 grams of protein per day Clinimix E 5/20 @ 75 ml/hr with 20% lipids @ 81ml/hr will provide 90 gm protein and 2064 Kcal/day  Current Nutrition:  NPO  Assessment: 68 yo man on TPN with serosanguinous drainage from JP, (+)abd distention with open wound.  Plan for UGI on 12/26.  Received lasix x 1 overnight for (+)rales; current total fluids are 55ml/hr (TPN + Pantoprazole drip)  GI: S/P duodenotomy with primary ligation of bleeding duodenal ulcer.  (+)JP tube- output decreasing. Wants NGT removed, no blood evident in tube this AM. Ice chips. (-)N/V/flatus.  Protonix drip  Endo: No hx DM. Moderate SSI, cbgs 131-157  Lytes: K 4.3, Na 145, Mg & Phos wnl , CoCa 10  Renal: CRI. Cr 2.35- overall stable. UOP 3.5 ml/kg/hr  Pulm: S/P VATS/decort of empyema with talc pleurodesis. [place]-4L   Cards: BP OK, HR elevated  Hepatobil:  Alb low, LFTs wnl, TG 273  Neuro: Awake.  Pain controlled  ID: Zosyn post VATS, HIV meds currently on hold. WBC wnl, AFeb  Best Practices: scd's, ppi TPN Access: 12/21 R IJ CVC TPN day#:  12/22 >>  Plan:  - Increase Clinimix E 5/15 to 75 ml/hr, goal rate - Cont Lipids 95ml/hr - Add Insulin 12 units/bag to [place]

## 2014-05-13 ENCOUNTER — Inpatient Hospital Stay (HOSPITAL_COMMUNITY): Payer: Commercial Managed Care - HMO

## 2014-05-13 LAB — TYPE AND SCREEN
ABO/RH(D): A POS
ABO/RH(D): A POS
Antibody Screen: NEGATIVE
Antibody Screen: NEGATIVE
UNIT DIVISION: 0
UNIT DIVISION: 0
UNIT DIVISION: 0
UNIT DIVISION: 0
UNIT DIVISION: 0
Unit division: 0
Unit division: 0
Unit division: 0
Unit division: 0
Unit division: 0
Unit division: 0
Unit division: 0
Unit division: 0
Unit division: 0

## 2014-05-13 LAB — CBC
HCT: 31.9 % — ABNORMAL LOW (ref 39.0–52.0)
HCT: 32.4 % — ABNORMAL LOW (ref 39.0–52.0)
HEMATOCRIT: 32.6 % — AB (ref 39.0–52.0)
HEMOGLOBIN: 10.8 g/dL — AB (ref 13.0–17.0)
Hemoglobin: 10.8 g/dL — ABNORMAL LOW (ref 13.0–17.0)
Hemoglobin: 11.1 g/dL — ABNORMAL LOW (ref 13.0–17.0)
MCH: 30.1 pg (ref 26.0–34.0)
MCH: 29.5 pg (ref 26.0–34.0)
MCH: 30.7 pg (ref 26.0–34.0)
MCHC: 33.9 g/dL (ref 30.0–36.0)
MCHC: 33.1 g/dL (ref 30.0–36.0)
MCHC: 34.3 g/dL (ref 30.0–36.0)
MCV: 88.9 fL (ref 78.0–100.0)
MCV: 89.1 fL (ref 78.0–100.0)
MCV: 89.8 fL (ref 78.0–100.0)
PLATELETS: 215 10*3/uL (ref 150–400)
PLATELETS: 213 10*3/uL (ref 150–400)
Platelets: 238 10*3/uL (ref 150–400)
RBC: 3.59 MIL/uL — ABNORMAL LOW (ref 4.22–5.81)
RBC: 3.66 MIL/uL — ABNORMAL LOW (ref 4.22–5.81)
RBC: 3.61 MIL/uL — ABNORMAL LOW (ref 4.22–5.81)
RDW: 16.4 % — ABNORMAL HIGH (ref 11.5–15.5)
RDW: 16.3 % — AB (ref 11.5–15.5)
RDW: 16.6 % — AB (ref 11.5–15.5)
WBC: 7 10*3/uL (ref 4.0–10.5)
WBC: 7 10*3/uL (ref 4.0–10.5)
WBC: 7.3 10*3/uL (ref 4.0–10.5)

## 2014-05-13 LAB — GLUCOSE, CAPILLARY
GLUCOSE-CAPILLARY: 137 mg/dL — AB (ref 70–99)
GLUCOSE-CAPILLARY: 112 mg/dL — AB (ref 70–99)
Glucose-Capillary: 116 mg/dL — ABNORMAL HIGH (ref 70–99)
Glucose-Capillary: 124 mg/dL — ABNORMAL HIGH (ref 70–99)
Glucose-Capillary: 134 mg/dL — ABNORMAL HIGH (ref 70–99)
Glucose-Capillary: 153 mg/dL — ABNORMAL HIGH (ref 70–99)
Glucose-Capillary: 154 mg/dL — ABNORMAL HIGH (ref 70–99)

## 2014-05-13 LAB — BASIC METABOLIC PANEL
ANION GAP: 11 (ref 5–15)
BUN: 50 mg/dL — AB (ref 6–23)
CHLORIDE: 114 meq/L — AB (ref 96–112)
CO2: 18 mmol/L — ABNORMAL LOW (ref 19–32)
Calcium: 7.9 mg/dL — ABNORMAL LOW (ref 8.4–10.5)
Creatinine, Ser: 2.1 mg/dL — ABNORMAL HIGH (ref 0.50–1.35)
GFR, EST AFRICAN AMERICAN: 36 mL/min — AB (ref >90)
GFR, EST NON AFRICAN AMERICAN: 31 mL/min — AB (ref >90)
Glucose, Bld: 116 mg/dL — ABNORMAL HIGH (ref 70–99)
Potassium: 3.2 mmol/L — ABNORMAL LOW (ref 3.5–5.1)
Sodium: 143 mmol/L (ref 135–145)

## 2014-05-13 MED ORDER — SODIUM CHLORIDE 0.9 % IV BOLUS (SEPSIS)
500.0000 mL | Freq: Once | INTRAVENOUS | Status: AC
  Administered 2014-05-13: 500 mL via INTRAVENOUS

## 2014-05-13 MED ORDER — POTASSIUM CHLORIDE 10 MEQ/50ML IV SOLN
10.0000 meq | INTRAVENOUS | Status: AC
  Administered 2014-05-13 (×4): 10 meq via INTRAVENOUS
  Filled 2014-05-13 (×3): qty 50

## 2014-05-13 MED ORDER — FAT EMULSION 20 % IV EMUL
250.0000 mL | INTRAVENOUS | Status: AC
  Administered 2014-05-13: 250 mL via INTRAVENOUS
  Filled 2014-05-13: qty 250

## 2014-05-13 MED ORDER — TRACE MINERALS CR-CU-F-FE-I-MN-MO-SE-ZN IV SOLN
INTRAVENOUS | Status: AC
  Administered 2014-05-13: 18:00:00 via INTRAVENOUS
  Filled 2014-05-13: qty 2000

## 2014-05-13 MED ORDER — FUROSEMIDE 10 MG/ML IJ SOLN
40.0000 mg | Freq: Once | INTRAMUSCULAR | Status: AC
  Administered 2014-05-13: 40 mg via INTRAVENOUS
  Filled 2014-05-13: qty 4

## 2014-05-13 MED ORDER — PANTOPRAZOLE SODIUM 40 MG IV SOLR
40.0000 mg | Freq: Two times a day (BID) | INTRAVENOUS | Status: DC
  Administered 2014-05-13 – 2014-05-20 (×16): 40 mg via INTRAVENOUS
  Filled 2014-05-13 (×23): qty 40

## 2014-05-13 NOTE — Progress Notes (Signed)
Patient transported via bed on telemetry and O2 to room 3S09.  Care resumed by 3S RN.

## 2014-05-13 NOTE — Progress Notes (Signed)
Assessed patient and found what appeared to be tissue in the patients chest tube.  Gross ICU RN who was caring for patient prior to transfer.  Previous RN stated that "this is known doctor is aware."    Will continue to monitor.

## 2014-05-13 NOTE — Progress Notes (Signed)
[facility] PROGRESS NOTE  Azam Gervasi LOV:[id number] DOB: [birth year] DOA: 04/28/2014 PCP: Cloyd Stagers, MD  Assessment/Plan: Principal Problem:  GI bleed - General surgery on board. Per their notes GI may be consulted if continued bleeding occurs - will monitor cbc's. Monitor in ICU - Rectal tube inserted to assess better output  Active Problems:   Human immunodeficiency virus (HIV) disease - ID on board and will manage    Hypokalemia - on TPN will defer replacement to pharmacy     Recurrent pleural effusion on right - Pt is s/p VATs with pleurodesis - Malignant effusion    AKI (acute kidney injury) - Improving most likely elevated secondary to GI bleed - Will continue to monitor cbc and S creatinine    Protein-calorie malnutrition, severe - Patient currently on TPN    SOB (shortness of breath) - Resolved, chest tube in place please see below     Acute respiratory failure with hypoxia - improved s/p chest tube placement - critical care team has signed off - Cardiothoracic team managing congestive   Code Status: presumed full Family Communication:  None at bedside Disposition Plan: Pending improvement in condition   Consultants:  CT  General surgery  Procedures: 12/7 Outpatient thoracentesis by IR> exudative, cytology negative 12/10 abdominal ultrasound> 13 mm gallstone, mild tenderness over gallbladder without gallbladder thickening, possible left upper pole renal calculus, right pleural effusion noted 12/10 renal ultrasound> no hydronephrosis 12/11 HIDA scan> patent cystic duct without evidence of acute cholecystitis 12/11 Echo> LVEF 65%, RV normal. 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 Would like to know when his chest  tube will be removed  Objective: Filed Vitals:   05/13/14 0804  BP:   Pulse:   Temp: 97.7 F (36.5 C)  Resp:     Intake/Output Summary (Last 24 hours) at 05/13/14 1119 Last data filed at 05/13/14 0800  Gross per 24 hour  Intake 2366.25 ml  Output   1630 ml  Net 736.25 ml   Filed Weights   05/03/14 2205 05/05/14 1830 05/09/14 0530  Weight: 56.836 kg (125 lb 4.8 oz) 55.5 kg (122 lb 5.7 oz) 56.9 kg (125 lb 7.1 oz)    Exam:   General:  Pt in nad, alert and awake  Cardiovascular: rrr, no mrg  Respiratory: no wheezes, breathing comfortably on RA  Abdomen: ND, no guarding  Musculoskeletal: no cyanosis or clubbing   Data Reviewed: Basic Metabolic Panel:  Recent Labs Lab 05/09/14 2020 05/10/14 0430 05/10/14 1028 05/11/14 0500 05/12/14 0214 05/13/14 0621  NA 140 143  --  141 145 143  K 4.3 4.3  --  3.8 4.3 3.2*  CL 111 119*  --  115* 116* 114*  CO2 17* 18*  --  19 23 18*  GLUCOSE 265* 125*  --  148* 94 116*  BUN 39* 39*  --  38* 41* 50*  CREATININE 1.91* 2.32*  --  2.31* 2.35* 2.10*  CALCIUM 6.4* 6.9*  --  7.5* 8.1* 7.9*  MG  --   --  1.6 1.7 1.9  --   PHOS  --   --  5.4* 4.1 4.2  --    Liver Function Tests:  Recent Labs Lab 05/07/14 0500 05/09/14 0325 05/11/14 0500 05/12/14 0214  AST 21 16 43* 35  ALT 15 10 37 30  ALKPHOS 59 50 61 76  BILITOT 0.5 <0.2* 0.7 0.5  PROT 5.1* 4.0* 4.0* 4.4*  ALBUMIN 1.6* 1.0* 1.7* 1.6*   No results for input(s): LIPASE, AMYLASE in the last 168 hours. No results for input(s): AMMONIA in the last 168 hours. CBC:  Recent Labs Lab 05/09/14 2020 05/10/14 0430 05/11/14 0500 05/12/14 0214 05/13/14 0621  WBC 5.7 6.6 6.7 7.4 7.0  HGB 12.9* 12.7* 11.4* 11.4* 10.8*  HCT 36.9* 35.6* 33.2* 34.0* 31.9*  MCV 86.8 85.0 86.2 88.5 88.9  PLT 101* 132* 150 184 215   Cardiac Enzymes: No results for input(s): CKTOTAL, CKMB, CKMBINDEX, TROPONINI in the last 168 hours. BNP (last 3 results)  Recent Labs  04/21/14 1131   PROBNP 25.0   CBG:  Recent Labs Lab 05/12/14 1559 05/12/14 1930 05/13/14 0018 05/13/14 0425 05/13/14 0757  GLUCAP 97 118* 134* 137* 112*    Recent Results (from the past 240 hour(s))  Surgical pcr screen     Status: None   Collection Time: 05/04/14  9:12 PM  Result Value Ref Range Status   MRSA, PCR NEGATIVE NEGATIVE Final   Staphylococcus aureus NEGATIVE NEGATIVE Final    Comment:        The Xpert SA Assay (FDA approved for NASAL specimens in patients over 66 years of age), is one component of a comprehensive surveillance program.  Test performance has been validated by [employer] for patients greater than or equal to 11 year old. It is not intended to diagnose infection nor to guide or monitor treatment.   Anaerobic culture     Status: None   Collection Time: 05/05/14  3:08 PM  Result Value Ref Range Status   Specimen Description FLUID PLEURAL RIGHT  Final   Special Requests NONE  Final   Gram Stain   Final    NO WBC SEEN NO ORGANISMS SEEN Performed at [facility]    Culture   Final    NO ANAEROBES ISOLATED Performed at [facility]    Report Status 05/10/2014 FINAL  Final  Body fluid culture     Status: None   Collection Time: 05/05/14  3:08 PM  Result Value Ref Range Status   Specimen Description FLUID PLEURAL RIGHT  Final   Special Requests NONE  Final   Gram Stain   Final    NO WBC SEEN NO ORGANISMS SEEN Performed at [facility]    Culture   Final    NO GROWTH 3 DAYS Performed at [facility]    Report Status 05/08/2014 FINAL  Final     Studies: Dg Chest Port 1 View  05/13/2014   CLINICAL DATA:  Pleural effusion  EXAM: PORTABLE CHEST - 1 VIEW  COMPARISON:  05/12/2014  FINDINGS: Right-sided chest tube is again identified and stable. Right jugular central venous line is again seen and stable. The cardiac shadow is stable. Diffuse interstitial changes are again noted bilaterally. Persistent changes are noted  in the right lung base when compare with the prior exam. The degree of basilar pneumothorax has improved in the interval.  IMPRESSION: Stable infiltrative changes in the right base.  Decrease in right basilar pneumothorax.   Electronically Signed   By: Inez Catalina M.D.   On: 05/13/2014 07:33   Dg Chest Port 1 View  05/12/2014   CLINICAL DATA:  Shortness of breath.  EXAM: PORTABLE CHEST - 1 VIEW  COMPARISON:  05/11/2014.  FINDINGS: Interim removal of NG tube. Right IJ line  and right lower chest tube in stable position. The upper chest tube is been removed. Mediastinum and hilar structures are normal. Heart size normal. Persistent loculated right base pneumothorax. No change. Persistent atelectasis and/or infiltrate right lower lobe. No acute bony abnormality.  IMPRESSION: 1. Interim removal of NG tube and right upper chest tube. Right IJ line and right lower chest tube in stable position. 2. Persistent loculated right base pneumothorax. 3. Persistent atelectasis and/or infiltrate right lower lobe.   Electronically Signed   By: Marcello Moores  Register   On: 05/12/2014 07:17    Scheduled Meds: . antiseptic oral rinse  7 mL Mouth Rinse q12n4p  . chlorhexidine  15 mL Mouth Rinse BID  . insulin aspart  0-15 Units Subcutaneous 6 times per day  . pantoprazole (PROTONIX) IV  40 mg Intravenous Q12H  . potassium chloride  10 mEq Intravenous Q1 Hr x 4   Continuous Infusions: . Marland KitchenTPN (CLINIMIX-E) Adult 75 mL/hr at 05/13/14 0800   And  . fat emulsion 250 mL (05/13/14 0800)  . Marland KitchenTPN (CLINIMIX-E) Adult     And  . fat emulsion       Time spent: > 35 minutes    Velvet Bathe  [facility] Pager [phone]. If 7PM-7AM, please contact night-coverage at [email or web address], password [facility] 05/13/2014, 11:19 AM  LOS: 15 days

## 2014-05-13 NOTE — Progress Notes (Signed)
PARENTERAL NUTRITION CONSULT NOTE - FOLLOW UP  Pharmacy Consult for TPN Indication: Prolonged ileus  No Known Allergies  Patient Measurements: Height: 5\' 6"  (167.6 cm) Weight: 125 lb 7.1 oz (56.9 kg) IBW/kg (Calculated) : 63.8  Vital Signs: Temp: 98.1 F (36.7 C) (12/25 0017) Temp Source: Oral (12/25 0017) BP: 111/65 mmHg (12/25 0630) Pulse Rate: 115 (12/25 0630) Intake/Output from previous day: 12/24 0701 - 12/25 0700 In: 2606.3 [I.V.:260; IV Piggyback:500; TPN:1846.3] Out: 1800 [Urine:1575; Drains:100; Chest Tube:125] Intake/Output from this shift:    Labs:  Recent Labs  05/11/14 0500 05/12/14 0214 05/13/14 0621  WBC 6.7 7.4 7.0  HGB 11.4* 11.4* 10.8*  HCT 33.2* 34.0* 31.9*  PLT 150 184 215     Recent Labs  05/10/14 1028 05/11/14 0500 05/12/14 0214 05/13/14 0621  NA  --  141 145 143  K  --  3.8 4.3 3.2*  CL  --  115* 116* 114*  CO2  --  19 23 18*  GLUCOSE  --  148* 94 116*  BUN  --  38* 41* 50*  CREATININE  --  2.31* 2.35* 2.10*  CALCIUM  --  7.5* 8.1* 7.9*  MG 1.6 1.7 1.9  --   PHOS 5.4* 4.1 4.2  --   PROT  --  4.0* 4.4*  --   ALBUMIN  --  1.7* 1.6*  --   AST  --  43* 35  --   ALT  --  37 30  --   ALKPHOS  --  61 76  --   BILITOT  --  0.7 0.5  --   PREALBUMIN  --  9.7*  --   --   TRIG  --  273*  --   --    Estimated Creatinine Clearance: 27.8 mL/min (by C-G formula based on Cr of 2.1).    Recent Labs  05/12/14 1930 05/13/14 0018 05/13/14 0425  GLUCAP 118* 134* 137*   Insulin Requirements in the past 24 hours: 4 units Novolog SSI; 12 units regular insulin in TPN bag  Nutritional Goals:  1900-2100 kCal, 85-100 grams of protein per day  Current Nutrition:  Clinimix E 5/20 @ 75 ml/hr with 20%lipids @ 24ml/hr will provide 90 gm protein and 2064 Kcal/day  Assessment: 68 yo man on TPN with serosanguinous drainage from JP, (+)abd distention with open wound.   GI: S/P duodenotomy with primary ligation of bleeding duodenal ulcer on  12/19. Plan for UBI on 12/26.  (+)JP drain- o/p 100/24h. Prealbumin 9.7 (low at baseline -likely due to recent surgery). Protonix gtt d/c yesterday after 72hrs - no current orders for PPI. Pt with large loose, bloody, tarry stool 12/25. Hgb 10.8 (down slightly).  Endo: No hx DM. Moderate SSI and insulin in TPN, CBGs well controlled (97-137).  Lytes: K 3.2 - not replaced yet, Mg & Phos wnl , Corr Ca 9.8.  Renal:CRI.Cr 2.1- overall stable. UOP 1.2 ml/kg/hr. I/O +0.9L positive.  Pulm: S/P VATS/decort of empyema with talc pleurodesis.6L [place].   Cards: BP low-nl, sinus tachy  Hepatobil: LFTs wnl, TG 273  Neuro:A&O. Pain controlled  ID:HIV meds remain on hold. WBC wnl, AFeb  Best Practices: SCDs   TPN Access: 12/21 R IJ CVC   TPN day#:  12/22 >>  Plan:  - Continue Clinimix E 5/15 at 75 ml/hr and 20% lipids at 64ml/hr. This provides 100% of estimated needs. - KCl 3mEq/50ml q1h x 4 runs - Continue insulin in TPN (12 units/bag) - BMET, TG in  a.m. - F/u starting of IV PPI q12h especially with hematochezia  Sherlon Handing, PharmD, BCPS Clinical pharmacist, pager [phone] 05/13/2014 8:02 AM

## 2014-05-13 NOTE — Progress Notes (Signed)
eLink Physician-Brief Progress Note Patient Name: [name] [name] DOB: [birth year] MRN: [id number]   Date of Service  05/13/2014  HPI/Events of Note  Called by bedside RN re: hematochezia  eICU Interventions  STAT CBC Type and screen NS 500 cc     Intervention Category Intermediate Interventions: Bleeding - evaluation and treatment with blood products  Woodford Strege 05/13/2014, 4:55 AM

## 2014-05-13 NOTE — Progress Notes (Signed)
4 Days Post-Op  Subjective: Bloody bowel movements last night/early this morning Denies abdominal pain  Objective: Vital signs in last 24 hours: Temp:  [97.7 F (36.5 C)-99 F (37.2 C)] 97.7 F (36.5 C) (12/25 0804) Pulse Rate:  [103-123] 115 (12/25 0630) Resp:  [16-38] 27 (12/25 0630) BP: (89-124)/(52-83) 111/65 mmHg (12/25 0630) SpO2:  [85 %-97 %] 93 % (12/25 0630) Last BM Date: 05/09/14  Intake/Output from previous day: 12/24 0701 - 12/25 0700 In: 2606.3 [I.V.:260; IV Piggyback:500; TPN:1846.3] Out: 1800 [Urine:1575; Drains:100; Chest Tube:125] Intake/Output this shift:    Abdomen soft, drain serosang without bile  Lab Results:   Recent Labs  05/12/14 0214 05/13/14 0621  WBC 7.4 7.0  HGB 11.4* 10.8*  HCT 34.0* 31.9*  PLT 184 215   BMET  Recent Labs  05/12/14 0214 05/13/14 0621  NA 145 143  K 4.3 3.2*  CL 116* 114*  CO2 23 18*  GLUCOSE 94 116*  BUN 41* 50*  CREATININE 2.35* 2.10*  CALCIUM 8.1* 7.9*   PT/INR No results for input(s): LABPROT, INR in the last 72 hours. ABG No results for input(s): PHART, HCO3 in the last 72 hours.  Invalid input(s): PCO2, PO2  Studies/Results: Dg Chest Port 1 View  05/13/2014   CLINICAL DATA:  Pleural effusion  EXAM: PORTABLE CHEST - 1 VIEW  COMPARISON:  05/12/2014  FINDINGS: Right-sided chest tube is again identified and stable. Right jugular central venous line is again seen and stable. The cardiac shadow is stable. Diffuse interstitial changes are again noted bilaterally. Persistent changes are noted in the right lung base when compare with the prior exam. The degree of basilar pneumothorax has improved in the interval.  IMPRESSION: Stable infiltrative changes in the right base.  Decrease in right basilar pneumothorax.   Electronically Signed   By: Inez Catalina M.D.   On: 05/13/2014 07:33   Dg Chest Port 1 View  05/12/2014   CLINICAL DATA:  Shortness of breath.  EXAM: PORTABLE CHEST - 1 VIEW  COMPARISON:   05/11/2014.  FINDINGS: Interim removal of NG tube. Right IJ line and right lower chest tube in stable position. The upper chest tube is been removed. Mediastinum and hilar structures are normal. Heart size normal. Persistent loculated right base pneumothorax. No change. Persistent atelectasis and/or infiltrate right lower lobe. No acute bony abnormality.  IMPRESSION: 1. Interim removal of NG tube and right upper chest tube. Right IJ line and right lower chest tube in stable position. 2. Persistent loculated right base pneumothorax. 3. Persistent atelectasis and/or infiltrate right lower lobe.   Electronically Signed   By: Marcello Moores  Register   On: 05/12/2014 07:17    Anti-infectives: Anti-infectives    Start     Dose/Rate Route Frequency Ordered Stop   05/09/14 1815  cefoTEtan (CEFOTAN) 1 g in dextrose 5 % 50 mL IVPB     1 g100 mL/hr over 30 Minutes Intravenous  Once 05/09/14 1805 05/09/14 1720   05/05/14 1900  piperacillin-tazobactam (ZOSYN) IVPB 3.375 g     3.375 g12.5 mL/hr over 240 Minutes Intravenous Every 8 hours 05/05/14 1533 05/08/14 1520   05/05/14 0600  cefUROXime (ZINACEF) 1.5 g in dextrose 5 % 50 mL IVPB     1.5 g100 mL/hr over 30 Minutes Intravenous On call to O.R. 05/04/14 1106 05/05/14 1345   05/03/14 0800  darunavir-cobicistat (PREZCOBIX) 800-150 MG per tablet 1 tablet  Status:  Discontinued     1 tablet Oral Daily with breakfast 05/02/14 1404 05/09/14 2051  05/01/14 1000  lamiVUDine (EPIVIR) 10 MG/ML solution 100 mg  Status:  Discontinued     100 mg Oral Daily 04/30/14 1354 05/09/14 2051   04/30/14 1000  zidovudine (RETROVIR) capsule 300 mg  Status:  Discontinued     300 mg Oral Every 12 hours 04/30/14 0922 05/09/14 2051   04/29/14 1800  atazanavir-cobicistat (Evotaz) 300-150 MG per tablet 1 tablet  Status:  Discontinued     1 tablet Oral Daily with breakfast 04/29/14 1408 05/02/14 1404   04/29/14 1000  cefTRIAXone (ROCEPHIN) 1 g in dextrose 5 % 50 mL IVPB - Premix  Status:   Discontinued     1 g100 mL/hr over 30 Minutes Intravenous Every 24 hours 04/29/14 0856 05/03/14 1214   04/28/14 1615  lamiVUDine (EPIVIR) tablet 150 mg  Status:  Discontinued     150 mg Oral Daily 04/28/14 1454 04/30/14 1354   04/28/14 1600  zidovudine (RETROVIR) tablet 300 mg  Status:  Discontinued     300 mg Oral 2 times daily 04/28/14 1454 04/30/14 0921      Assessment/Plan: s/p Procedure(s): EXPLORATORY LAPAROTOMY (N/A) DUODENOTOMY with Primary ligation Bleeding Duodenal Ulcer (N/A)  Hgb/Hct stable.  Suspect old blood per rectum and not bleeding from ulcer site.  If it persists, however, he may need GI to scope Will get UGI tomorrow to assess the duodenum for leak prior to starting PO  LOS: 15 days    Aldahir Litaker A 05/13/2014

## 2014-05-13 NOTE — Progress Notes (Signed)
Pt had a large loose, bloody, tarry stool and notified MD Simmonds. MD Simmonds ordered STAT CBC, Type and Screen and started a 500 cc bolus of 0.9% NS. Will continue to monitor and assess.

## 2014-05-13 NOTE — Progress Notes (Signed)
[facility] DAILY ICU PROGRESS NOTE                   [address]            [place] [address]          [phone]   4 Days Post-Op Procedure(s) (LRB): EXPLORATORY LAPAROTOMY (N/A) DUODENOTOMY with Primary ligation Bleeding Duodenal Ulcer (N/A)  Total Length of Stay:  LOS: 15 days   Subjective: Comfortable, no new complaints.  Breathing stable. Pain controlled.  .  Objective: Vital signs in last 24 hours: Temp:  [97.7 F (36.5 C)-99 F (37.2 C)] 98.4 F (36.9 C) (12/25 1209) Pulse Rate:  [36-120] 120 (12/25 0800) Cardiac Rhythm:  [-] Sinus tachycardia (12/25 0800) Resp:  [16-38] 28 (12/25 0800) BP: (89-124)/(52-83) 108/73 mmHg (12/25 0800) SpO2:  [91 %-98 %] 94 % (12/25 0800)  Filed Weights   05/03/14 2205 05/05/14 1830 05/09/14 0530  Weight: 125 lb 4.8 oz (56.836 kg) 122 lb 5.7 oz (55.5 kg) 125 lb 7.1 oz (56.9 kg)    Weight change:    Hemodynamic parameters for last 24 hours:    Intake/Output from previous day: 12/24 0701 - 12/25 0700 In: 2691.3 [I.V.:260; IV Piggyback:500; TPN:1931.3] Out: 1980 [Urine:1575; Drains:100; Chest Tube:305]  Intake/Output this shift: Total I/O In: 85 [TPN:85] Out: 80 [Urine:80]  Current Meds: Scheduled Meds: . antiseptic oral rinse  7 mL Mouth Rinse q12n4p  . chlorhexidine  15 mL Mouth Rinse BID  . insulin aspart  0-15 Units Subcutaneous 6 times per day  . pantoprazole (PROTONIX) IV  40 mg Intravenous Q12H   Continuous Infusions: . Marland KitchenTPN (CLINIMIX-E) Adult 75 mL/hr at 05/13/14 0800   And  . fat emulsion 250 mL (05/13/14 0800)  . Marland KitchenTPN (CLINIMIX-E) Adult     And  . fat emulsion     PRN Meds:.fentaNYL, levalbuterol, ondansetron (ZOFRAN) IV  Physical Exam: General appearance: alert, cooperative and no distress Heart: regular rate and rhythm Lungs: Decreased BS in bases Wound: Dressed and dry  Chest tube: No air leak   Lab Results: CBC:  Recent Labs  05/12/14 0214 05/13/14 0621  WBC 7.4 7.0  HGB  11.4* 10.8*  HCT 34.0* 31.9*  PLT 184 215   BMET:   Recent Labs  05/12/14 0214 05/13/14 0621  NA 145 143  K 4.3 3.2*  CL 116* 114*  CO2 23 18*  GLUCOSE 94 116*  BUN 41* 50*  CREATININE 2.35* 2.10*  CALCIUM 8.1* 7.9*    PT/INR: No results for input(s): LABPROT, INR in the last 72 hours. Radiology: Dg Chest Port 1 View  05/13/2014   CLINICAL DATA:  Pleural effusion  EXAM: PORTABLE CHEST - 1 VIEW  COMPARISON:  05/12/2014  FINDINGS: Right-sided chest tube is again identified and stable. Right jugular central venous line is again seen and stable. The cardiac shadow is stable. Diffuse interstitial changes are again noted bilaterally. Persistent changes are noted in the right lung base when compare with the prior exam. The degree of basilar pneumothorax has improved in the interval.  IMPRESSION: Stable infiltrative changes in the right base.  Decrease in right basilar pneumothorax.   Electronically Signed   By: Inez Catalina M.D.   On: 05/13/2014 07:33   Dg Chest Port 1 View  05/12/2014   CLINICAL DATA:  Shortness of breath.  EXAM: PORTABLE CHEST - 1 VIEW  COMPARISON:  05/11/2014.  FINDINGS: Interim removal of NG tube. Right IJ line and right lower  chest tube in stable position. The upper chest tube is been removed. Mediastinum and hilar structures are normal. Heart size normal. Persistent loculated right base pneumothorax. No change. Persistent atelectasis and/or infiltrate right lower lobe. No acute bony abnormality.  IMPRESSION: 1. Interim removal of NG tube and right upper chest tube. Right IJ line and right lower chest tube in stable position. 2. Persistent loculated right base pneumothorax. 3. Persistent atelectasis and/or infiltrate right lower lobe.   Electronically Signed   By: Marcello Moores  Register   On: 05/12/2014 07:17   Path: Diagnosis 1. Soft tissue mass, simple excision, right pleural - POORLY DIFFERENTIATED MALIGNANCY. - SEE MICROSCOPIC DESCRIPTION. 2. Lung, biopsy, right lower  lobe - POORLY DIFFERENTIATED MALIGNANCY. - SEE MICROSCOPIC DESCRIPTION. 3. Pleura, peel, right - POORLY DIFFERENTIATED MALIGNANCY. - SEE MICROSCOPIC DESCRIPTION. Microscopic Comment 1. -3. All of these specimens are involved by a poorly differentiated malignancy characterized by sheets of cells with enlarged nuclei with nucleoli and frequent mitotic figures. There are large areas of tumor necrosis present. Immunohistochemistry is performed and the tumor shows positivity with cytokeratin 7, CD10, BCL-2, MOC-31 and Vimentin. The tumor is negative with an extensive battery of immunohistochemical stains including thyroid transcription factor-1, Napsin-A, S100, HMB45, Melan-A, carcinoembryonic antigen, CD99, WT-1, D2-40, Desmin, epithelial membrane antigen, Hepar-1, Chromogranin, Synaptophysin, cytokeratin 5/6, CD45 (LCA),CD56, CD20, CD3, CD43, CD79a, Cytokeratin 20, prostate specific antigen, prostein and p63. The immunophenotype is not specific as to differentiation or possible primary and the tumor will be submitted to [facility] for tumor of unknown type genotyping. Case discussed with Dr. Prescott Gum on 05/10/14 and 05/12/14. (  Assessment/Plan: S/P Procedure(s) (LRB): EXPLORATORY LAPAROTOMY (N/A) DUODENOTOMY with Primary ligation Bleeding Duodenal Ulcer (N/A) Stable from thoracic surgery standpoint. CT output 305  ml over past 24 hours.  Continue CT to water seal for now.When  drainage decreases, can d/c remaining CT soon. Continue current care per CCS, CCM. Final pathology on lung tumor is pending. 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

## 2014-05-13 NOTE — Progress Notes (Signed)
Pt was assessed and found to course lung sounds and wheezing though out. Pt was given breathing treatment per standing PRN orders.  2030 pt breath sounds reassessed. Pt has had no improvement from breathing treatment. MD paged. 2100 Md returned page and gave orders.  2115 Pt refused to wear Venti mask and repeatedly removes mask. Pt educated on why he required the venti mask. Pt refused and took off mask. Pt placed back on the nasal cannula at 5L. Will continue to monitor pt.

## 2014-05-14 ENCOUNTER — Inpatient Hospital Stay (HOSPITAL_COMMUNITY): Payer: Commercial Managed Care - HMO

## 2014-05-14 LAB — CBC
HCT: 32.9 % — ABNORMAL LOW (ref 39.0–52.0)
HCT: 33.6 % — ABNORMAL LOW (ref 39.0–52.0)
HEMATOCRIT: 31.8 % — AB (ref 39.0–52.0)
Hemoglobin: 11 g/dL — ABNORMAL LOW (ref 13.0–17.0)
Hemoglobin: 10.6 g/dL — ABNORMAL LOW (ref 13.0–17.0)
Hemoglobin: 11.2 g/dL — ABNORMAL LOW (ref 13.0–17.0)
MCH: 30 pg (ref 26.0–34.0)
MCH: 30 pg (ref 26.0–34.0)
MCH: 30.7 pg (ref 26.0–34.0)
MCHC: 33.4 g/dL (ref 30.0–36.0)
MCHC: 33.3 g/dL (ref 30.0–36.0)
MCHC: 33.3 g/dL (ref 30.0–36.0)
MCV: 89.6 fL (ref 78.0–100.0)
MCV: 90.1 fL (ref 78.0–100.0)
MCV: 92.1 fL (ref 78.0–100.0)
PLATELETS: 230 10*3/uL (ref 150–400)
PLATELETS: 251 10*3/uL (ref 150–400)
Platelets: 230 10*3/uL (ref 150–400)
RBC: 3.67 MIL/uL — ABNORMAL LOW (ref 4.22–5.81)
RBC: 3.53 MIL/uL — ABNORMAL LOW (ref 4.22–5.81)
RBC: 3.65 MIL/uL — ABNORMAL LOW (ref 4.22–5.81)
RDW: 16.6 % — AB (ref 11.5–15.5)
RDW: 16.5 % — ABNORMAL HIGH (ref 11.5–15.5)
RDW: 16.6 % — ABNORMAL HIGH (ref 11.5–15.5)
WBC: 7.7 10*3/uL (ref 4.0–10.5)
WBC: 7.3 10*3/uL (ref 4.0–10.5)
WBC: 9.4 10*3/uL (ref 4.0–10.5)

## 2014-05-14 LAB — GLUCOSE, CAPILLARY
GLUCOSE-CAPILLARY: 100 mg/dL — AB (ref 70–99)
GLUCOSE-CAPILLARY: 115 mg/dL — AB (ref 70–99)
Glucose-Capillary: 139 mg/dL — ABNORMAL HIGH (ref 70–99)
Glucose-Capillary: 126 mg/dL — ABNORMAL HIGH (ref 70–99)
Glucose-Capillary: 123 mg/dL — ABNORMAL HIGH (ref 70–99)

## 2014-05-14 LAB — TRIGLYCERIDES: Triglycerides: 217 mg/dL — ABNORMAL HIGH (ref <150)

## 2014-05-14 LAB — BASIC METABOLIC PANEL
Anion gap: 8 (ref 5–15)
BUN: 53 mg/dL — AB (ref 6–23)
CO2: 23 mmol/L (ref 19–32)
Calcium: 8.1 mg/dL — ABNORMAL LOW (ref 8.4–10.5)
Chloride: 114 mEq/L — ABNORMAL HIGH (ref 96–112)
Creatinine, Ser: 1.98 mg/dL — ABNORMAL HIGH (ref 0.50–1.35)
GFR, EST AFRICAN AMERICAN: 39 mL/min — AB (ref >90)
GFR, EST NON AFRICAN AMERICAN: 33 mL/min — AB (ref >90)
Glucose, Bld: 102 mg/dL — ABNORMAL HIGH (ref 70–99)
POTASSIUM: 3.7 mmol/L (ref 3.5–5.1)
SODIUM: 145 mmol/L (ref 135–145)

## 2014-05-14 MED ORDER — TRACE MINERALS CR-CU-F-FE-I-MN-MO-SE-ZN IV SOLN
INTRAVENOUS | Status: DC
  Filled 2014-05-14: qty 2000

## 2014-05-14 MED ORDER — M.V.I. ADULT IV INJ
INJECTION | INTRAVENOUS | Status: AC
Start: 2014-05-14 — End: 2014-05-15
  Administered 2014-05-14: 19:00:00 via INTRAVENOUS
  Filled 2014-05-14: qty 2000

## 2014-05-14 MED ORDER — SODIUM CHLORIDE 0.9 % IJ SOLN: 10.0000 mL | INTRAMUSCULAR | Status: DC | PRN

## 2014-05-14 MED ORDER — POTASSIUM CHLORIDE 10 MEQ/50ML IV SOLN
10.0000 meq | INTRAVENOUS | Status: AC
  Administered 2014-05-14 (×2): 10 meq via INTRAVENOUS
  Filled 2014-05-14 (×2): qty 50

## 2014-05-14 MED ORDER — FAT EMULSION 20 % IV EMUL
250.0000 mL | INTRAVENOUS | Status: AC
  Administered 2014-05-14: 250 mL via INTRAVENOUS
  Filled 2014-05-14: qty 250

## 2014-05-14 MED ORDER — FAT EMULSION 20 % IV EMUL
240.0000 mL | INTRAVENOUS | Status: DC
  Filled 2014-05-14: qty 250

## 2014-05-14 MED ORDER — IOHEXOL 300 MG/ML  SOLN: 50.0000 mL | Freq: Once | INTRAMUSCULAR | Status: AC | PRN

## 2014-05-14 NOTE — Progress Notes (Signed)
[facility] DAILY ICU PROGRESS NOTE                   [address] [place]            [place] [address]          [phone]   5 Days Post-Op Procedure(s) (LRB): EXPLORATORY LAPAROTOMY (N/A) DUODENOTOMY with Primary ligation Bleeding Duodenal Ulcer (N/A)  Total Length of Stay:  LOS: 16 days   Subjective: Comfortable, no new complaints.  Breathing stable. Pain controlled. Confused today   .  Objective: Vital signs in last 24 hours: Temp:  [96.7 F (35.9 C)-98.4 F (36.9 C)] 97.4 F (36.3 C) (12/26 0800) Pulse Rate:  [87-111] 87 (12/26 1006) Cardiac Rhythm:  [-] Sinus tachycardia (12/26 0754) Resp:  [23-35] 23 (12/26 1006) BP: (100-112)/(50-71) 110/50 mmHg (12/26 1006) SpO2:  [92 %-97 %] 97 % (12/26 1006) FiO2 (%):  [45 %] 45 % (12/25 2112)  Filed Weights   05/03/14 2205 05/05/14 1830 05/09/14 0530  Weight: 125 lb 4.8 oz (56.836 kg) 122 lb 5.7 oz (55.5 kg) 125 lb 7.1 oz (56.9 kg)    Weight change:    Hemodynamic parameters for last 24 hours:    Intake/Output from previous day: 12/25 0701 - 12/26 0700 In: 1764.6 [YWV:3710.6] Out: 4615 [Urine:4500; Drains:85; Chest Tube:30]  Intake/Output this shift: Total I/O In: 170 [TPN:170] Out: 235 [Urine:225; Drains:10]  Current Meds: Scheduled Meds: . antiseptic oral rinse  7 mL Mouth Rinse q12n4p  . chlorhexidine  15 mL Mouth Rinse BID  . insulin aspart  0-15 Units Subcutaneous 6 times per day  . pantoprazole (PROTONIX) IV  40 mg Intravenous Q12H  . potassium chloride  10 mEq Intravenous Q1 Hr x 2   Continuous Infusions: . Marland KitchenTPN (CLINIMIX-E) Adult     And  . fat emulsion    . Marland KitchenTPN (CLINIMIX-E) Adult 75 mL/hr at 05/13/14 1740   And  . fat emulsion 250 mL (05/13/14 1739)   PRN Meds:.fentaNYL, levalbuterol, ondansetron (ZOFRAN) IV, sodium chloride  Physical Exam: General appearance: alert, cooperative and no distress Heart: regular rate and rhythm Lungs: Decreased BS in bases Wound: Dressed and dry  Chest  tube: No air leak   Lab Results: CBC:  Recent Labs  05/14/14 0420 05/14/14 1000  WBC 7.7 7.3  HGB 11.0* 10.6*  HCT 32.9* 31.8*  PLT 230 230   BMET:   Recent Labs  05/13/14 0621 05/14/14 0420  NA 143 145  K 3.2* 3.7  CL 114* 114*  CO2 18* 23  GLUCOSE 116* 102*  BUN 50* 53*  CREATININE 2.10* 1.98*  CALCIUM 7.9* 8.1*    PT/INR: No results for input(s): LABPROT, INR in the last 72 hours. Radiology: Dg Chest Port 1 View  05/13/2014   CLINICAL DATA:  Pleural effusion  EXAM: PORTABLE CHEST - 1 VIEW  COMPARISON:  05/12/2014  FINDINGS: Right-sided chest tube is again identified and stable. Right jugular central venous line is again seen and stable. The cardiac shadow is stable. Diffuse interstitial changes are again noted bilaterally. Persistent changes are noted in the right lung base when compare with the prior exam. The degree of basilar pneumothorax has improved in the interval.  IMPRESSION: Stable infiltrative changes in the right base.  Decrease in right basilar pneumothorax.   Electronically Signed   By: Inez Catalina M.D.   On: 05/13/2014 07:33   Path: Diagnosis 1. Soft tissue mass, simple excision, right pleural - POORLY DIFFERENTIATED MALIGNANCY. -  SEE MICROSCOPIC DESCRIPTION. 2. Lung, biopsy, right lower lobe - POORLY DIFFERENTIATED MALIGNANCY. - SEE MICROSCOPIC DESCRIPTION. 3. Pleura, peel, right - POORLY DIFFERENTIATED MALIGNANCY. - SEE MICROSCOPIC DESCRIPTION. Microscopic Comment 1. -3. All of these specimens are involved by a poorly differentiated malignancy characterized by sheets of cells with enlarged nuclei with nucleoli and frequent mitotic figures. There are large areas of tumor necrosis present. Immunohistochemistry is performed and the tumor shows positivity with cytokeratin 7, CD10, BCL-2, MOC-31 and Vimentin. The tumor is negative with an extensive battery of immunohistochemical stains including thyroid transcription factor-1, Napsin-A, S100, HMB45,  Melan-A, carcinoembryonic antigen, CD99, WT-1, D2-40, Desmin, epithelial membrane antigen, Hepar-1, Chromogranin, Synaptophysin, cytokeratin 5/6, CD45 (LCA),CD56, CD20, CD3, CD43, CD79a, Cytokeratin 20, prostate specific antigen, prostein and p63. The immunophenotype is not specific as to differentiation or possible primary and the tumor will be submitted to [facility] for tumor of unknown type genotyping. Case discussed with Dr. Prescott Gum on 05/10/14 and 05/12/14. (  Assessment/Plan: S/P Procedure(s) (LRB): EXPLORATORY LAPAROTOMY (N/A) DUODENOTOMY with Primary ligation Bleeding Duodenal Ulcer (N/A) Stable from thoracic surgery standpoint but overall weak CT output 30  ml over past 12  hours.  D?C chest tube today. Continue current care per CCS, CCM. Final pathology on lung tumor is pending. 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

## 2014-05-14 NOTE — Progress Notes (Addendum)
PARENTERAL NUTRITION CONSULT NOTE - FOLLOW UP  Pharmacy Consult for TPN Indication: Prolonged ileus  No Known Allergies  Patient Measurements: Height: 5\' 6"  (167.6 cm) Weight: 125 lb 7.1 oz (56.9 kg) IBW/kg (Calculated) : 63.8 Adjusted Body Weight:  Usual Weight:   Vital Signs: Temp: 97.4 F (36.3 C) (12/26 0800) Temp Source: Oral (12/26 0800) BP: 110/50 mmHg (12/26 1006) Pulse Rate: 87 (12/26 1006) Intake/Output from previous day: 12/25 0701 - 12/26 0700 In: 1764.6 [WVP:7106.2] Out: 4615 [Urine:4500; Drains:85; Chest Tube:30] Intake/Output from this shift: Total I/O In: 170 [TPN:170] Out: 235 [Urine:225; Drains:10]  Labs:  Recent Labs  05/13/14 1200 05/13/14 1800 05/14/14 0420  WBC 7.0 7.3 7.7  HGB 10.8* 11.1* 11.0*  HCT 32.6* 32.4* 32.9*  PLT 213 238 230     Recent Labs  05/12/14 0214 05/13/14 0621 05/14/14 0420  NA 145 143 145  K 4.3 3.2* 3.7  CL 116* 114* 114*  CO2 23 18* 23  GLUCOSE 94 116* 102*  BUN 41* 50* 53*  CREATININE 2.35* 2.10* 1.98*  CALCIUM 8.1* 7.9* 8.1*  MG 1.9  --   --   PHOS 4.2  --   --   PROT 4.4*  --   --   ALBUMIN 1.6*  --   --   AST 35  --   --   ALT 30  --   --   ALKPHOS 76  --   --   BILITOT 0.5  --   --   TRIG  --   --  217*   Estimated Creatinine Clearance: 29.5 mL/min (by C-G formula based on Cr of 1.98).    Recent Labs  05/13/14 2317 05/14/14 0409 05/14/14 0818  GLUCAP 153* 100* 139*    Medications:  Scheduled:  . antiseptic oral rinse  7 mL Mouth Rinse q12n4p  . chlorhexidine  15 mL Mouth Rinse BID  . insulin aspart  0-15 Units Subcutaneous 6 times per day  . pantoprazole (PROTONIX) IV  40 mg Intravenous Q12H    Insulin Requirements in the past 24 hours: 5 units mod SSI; 12 units regular insulin in TPN bag  Nutritional Goals:  1900-2100 kCal, 85-100 grams of protein per day  Current Nutrition:  Clinimix E 5/20 @ 75 ml/hr with 20%lipids @ 51ml/hr will provide 90 gm protein and 2064  Kcal/day  Assessment: 68 yo man on TPN with serosanguinous drainage from JP, (+)abd distention with open wound.   GI: S/P duodenotomy with primary ligation of bleeding duodenal ulcer on 12/19.  (+)JP drain- o/p 100/24h. Prealbumin 9.7 (low at baseline -likely due to recent surgery).  Abd soft, mild distention.  Lg, loose, bloody BM 12/26, none further documented.  UGI today, to start clears if OK.  PPI-IV bid  Endo: No hx DM. Moderate SSI and insulin in TPN, CBG < 150, except 153 x 1.  Lytes: K 3.7 (4 K-runs on 12/25)  Renal:  Cr 1.98, UOP 3.26ml/kg/hr, I/O (-)3L  Pulm: S/P VATS/decort of empyema with talc pleurodesis.5L [place].   Cards: BP low-nl, sinus tachy  Hepatobil: LFTs wnl, TG 273  Neuro:A&O. Pain controlled  ID: HIV meds remain on hold. WBC wnl, AFeb  Best Practices: SCDs  TPN Access: 12/21 R IJ CVC  TPN day#: 12/22 >>  Plan:  - Continue Clinimix E 5/15 at 75 ml/hr and 20% lipids at 99ml/hr. This provides 100% of estimated needs. - KCl 28mEq/50ml q1h x 2 runs - Continue insulin in TPN (12 units/bag)  Tillie Rung  Scherrie November, PharmD Clinical Pharmacist [facility]    Addendum: IV team RN called to inform pharmacy that patient had received a bag for Clinimix E 5/20 instead of Clinimix E 5/15 with all the appropriate additives. D/c'ed order for Clinimix E 5/15 and changed to Clinimix E 5/20 and instructed IV team RN to run bag at 75 mL/hr since patient was at caloric goal. This would provide patient with 100% of caloric needs.   Albertina Parr, PharmD., BCPS Clinical Pharmacist Pager [phone]

## 2014-05-14 NOTE — Progress Notes (Signed)
[facility] PROGRESS NOTE  [name] [name] WNU:[id number] DOB: 02-Jul-1945 DOA: 04/28/2014 PCP: Cloyd Stagers, MD  Assessment/Plan: Principal Problem:  GI bleed - General surgery on board. Plan is for UGI evaluation to assess for leak - will monitor cbc's. Monitor in ICU - Rectal tube inserted to assess better output  Active Problems:   Human immunodeficiency virus (HIV) disease - ID on board and will manage    Hypokalemia - on TPN will defer replacement to pharmacy     Recurrent pleural effusion on right - Pt is s/p VATs with pleurodesis - Malignant effusion    AKI (acute kidney injury) - Improving most likely elevated secondary to GI bleed - Will continue to monitor cbc and S creatinine    Protein-calorie malnutrition, severe - Patient currently on TPN    SOB (shortness of breath) - Resolved, chest tube in place please see below     Acute respiratory failure with hypoxia - improved s/p chest tube placement - critical care team has signed off - Cardiothoracic team managing congestive   Code Status: presumed full Family Communication:  None at bedside Disposition Plan: Pending improvement in condition   Consultants:  CT  General surgery  Procedures: 12/7 Outpatient thoracentesis by IR> exudative, cytology negative 12/10 abdominal ultrasound> 13 mm gallstone, mild tenderness over gallbladder without gallbladder thickening, possible left upper pole renal calculus, right pleural effusion noted 12/10 renal ultrasound> no hydronephrosis 12/11 HIDA scan> patent cystic duct without evidence of acute cholecystitis 12/11 Echo> LVEF 65%, RV normal. 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 Would like to know when he will be transitioned to  regular floor.   Objective: Filed Vitals:   05/14/14 1006  BP: 110/50  Pulse: 87  Temp:   Resp: 23    Intake/Output Summary (Last 24 hours) at 05/14/14 1457 Last data filed at 05/14/14 1300  Gross per 24 hour  Intake 1864.58 ml  Output   5250 ml  Net -3385.42 ml   Filed Weights   05/03/14 2205 05/05/14 1830 05/09/14 0530  Weight: 56.836 kg (125 lb 4.8 oz) 55.5 kg (122 lb 5.7 oz) 56.9 kg (125 lb 7.1 oz)    Exam:   General:  Pt in nad, alert and awake  Cardiovascular: rrr, no mrg  Respiratory: no wheezes, breathing comfortably on RA  Abdomen: ND, no guarding  Musculoskeletal: no cyanosis or clubbing   Data Reviewed: Basic Metabolic Panel:  Recent Labs Lab 05/10/14 0430 05/10/14 1028 05/11/14 0500 05/12/14 0214 05/13/14 0621 05/14/14 0420  NA 143  --  141 145 143 145  K 4.3  --  3.8 4.3 3.2* 3.7  CL 119*  --  115* 116* 114* 114*  CO2 18*  --  19 23 18* 23  GLUCOSE 125*  --  148* 94 116* 102*  BUN 39*  --  38* 41* 50* 53*  CREATININE 2.32*  --  2.31* 2.35* 2.10* 1.98*  CALCIUM 6.9*  --  7.5* 8.1* 7.9* 8.1*  MG  --  1.6 1.7 1.9  --   --   PHOS  --  5.4* 4.1 4.2  --   --    Liver Function Tests:  Recent Labs Lab 05/09/14 0325 05/11/14 0500 05/12/14 0214  AST 16 43* 35  ALT 10 37 30  ALKPHOS 50 61 76  BILITOT <  0.2* 0.7 0.5  PROT 4.0* 4.0* 4.4*  ALBUMIN 1.0* 1.7* 1.6*   No results for input(s): LIPASE, AMYLASE in the last 168 hours. No results for input(s): AMMONIA in the last 168 hours. CBC:  Recent Labs Lab 05/13/14 0621 05/13/14 1200 05/13/14 1800 05/14/14 0420 05/14/14 1000  WBC 7.0 7.0 7.3 7.7 7.3  HGB 10.8* 10.8* 11.1* 11.0* 10.6*  HCT 31.9* 32.6* 32.4* 32.9* 31.8*  MCV 88.9 89.1 89.8 89.6 90.1  PLT 215 213 238 230 230   Cardiac Enzymes: No results for input(s): CKTOTAL, CKMB, CKMBINDEX, TROPONINI in the last 168 hours. BNP (last 3 results)  Recent Labs  04/21/14 1131  PROBNP 25.0   CBG:  Recent Labs Lab 05/13/14 1958  05/13/14 2317 05/14/14 0409 05/14/14 0818 05/14/14 1221  GLUCAP 116* 153* 100* 139* 126*    Recent Results (from the past 240 hour(s))  Surgical pcr screen     Status: None   Collection Time: 05/04/14  9:12 PM  Result Value Ref Range Status   MRSA, PCR NEGATIVE NEGATIVE Final   Staphylococcus aureus NEGATIVE NEGATIVE Final    Comment:        The Xpert SA Assay (FDA approved for NASAL specimens in patients over 66 years of age), is one component of a comprehensive surveillance program.  Test performance has been validated by [employer] for patients greater than or equal to 66 year old. It is not intended to diagnose infection nor to guide or monitor treatment.   Anaerobic culture     Status: None   Collection Time: 05/05/14  3:08 PM  Result Value Ref Range Status   Specimen Description FLUID PLEURAL RIGHT  Final   Special Requests NONE  Final   Gram Stain   Final    NO WBC SEEN NO ORGANISMS SEEN Performed at [facility]    Culture   Final    NO ANAEROBES ISOLATED Performed at [facility]    Report Status 05/10/2014 FINAL  Final  Body fluid culture     Status: None   Collection Time: 05/05/14  3:08 PM  Result Value Ref Range Status   Specimen Description FLUID PLEURAL RIGHT  Final   Special Requests NONE  Final   Gram Stain   Final    NO WBC SEEN NO ORGANISMS SEEN Performed at [facility]    Culture   Final    NO GROWTH 3 DAYS Performed at [facility]    Report Status 05/08/2014 FINAL  Final     Studies: Dg Chest Port 1 View  05/13/2014   CLINICAL DATA:  Pleural effusion  EXAM: PORTABLE CHEST - 1 VIEW  COMPARISON:  05/12/2014  FINDINGS: Right-sided chest tube is again identified and stable. Right jugular central venous line is again seen and stable. The cardiac shadow is stable. Diffuse interstitial changes are again noted bilaterally. Persistent changes are noted in the right lung base when compare with the prior exam.  The degree of basilar pneumothorax has improved in the interval.  IMPRESSION: Stable infiltrative changes in the right base.  Decrease in right basilar pneumothorax.   Electronically Signed   By: Inez Catalina M.D.   On: 05/13/2014 07:33    Scheduled Meds: . antiseptic oral rinse  7 mL Mouth Rinse q12n4p  . chlorhexidine  15 mL Mouth Rinse BID  . insulin aspart  0-15 Units Subcutaneous 6 times per day  . pantoprazole (PROTONIX) IV  40 mg Intravenous Q12H  Continuous Infusions: . Marland KitchenTPN (CLINIMIX-E) Adult     And  . fat emulsion    . Marland KitchenTPN (CLINIMIX-E) Adult 75 mL/hr at 05/13/14 1740   And  . fat emulsion 250 mL (05/13/14 1739)     Time spent: > 35 minutes    Velvet Bathe  [facility] Pager [phone]. If 7PM-7AM, please contact night-coverage at [email or web address], password [facility] 05/14/2014, 2:57 PM  LOS: 16 days

## 2014-05-14 NOTE — Progress Notes (Signed)
5 Days Post-Op  Subjective: Denies n/v/abd pain.   Objective: Vital signs in last 24 hours: Temp:  [96.7 F (35.9 C)-98.4 F (36.9 C)] 97.5 F (36.4 C) (12/26 0415) Pulse Rate:  [56-122] 88 (12/26 0415) Resp:  [25-35] 27 (12/26 0415) BP: (98-112)/(62-72) 112/66 mmHg (12/26 0415) SpO2:  [92 %-100 %] 95 % (12/26 0415) FiO2 (%):  [45 %] 45 % (12/25 2112) Last BM Date: 05/13/14  Intake/Output from previous day: 12/25 0701 - 12/26 0700 In: 1764.6 [SWN:4627.0] Out: 4615 [Urine:4500; Drains:85; Chest Tube:30] Intake/Output this shift:    Alert, nad Soft, mild distension, nt, drain - serosang  Lab Results:   Recent Labs  05/13/14 1800 05/14/14 0420  WBC 7.3 7.7  HGB 11.1* 11.0*  HCT 32.4* 32.9*  PLT 238 230   BMET  Recent Labs  05/13/14 0621 05/14/14 0420  NA 143 145  K 3.2* 3.7  CL 114* 114*  CO2 18* 23  GLUCOSE 116* 102*  BUN 50* 53*  CREATININE 2.10* 1.98*  CALCIUM 7.9* 8.1*   PT/INR No results for input(s): LABPROT, INR in the last 72 hours. ABG No results for input(s): PHART, HCO3 in the last 72 hours.  Invalid input(s): PCO2, PO2  Studies/Results: Dg Chest Port 1 View  05/13/2014   CLINICAL DATA:  Pleural effusion  EXAM: PORTABLE CHEST - 1 VIEW  COMPARISON:  05/12/2014  FINDINGS: Right-sided chest tube is again identified and stable. Right jugular central venous line is again seen and stable. The cardiac shadow is stable. Diffuse interstitial changes are again noted bilaterally. Persistent changes are noted in the right lung base when compare with the prior exam. The degree of basilar pneumothorax has improved in the interval.  IMPRESSION: Stable infiltrative changes in the right base.  Decrease in right basilar pneumothorax.   Electronically Signed   By: Inez Catalina M.D.   On: 05/13/2014 07:33    Anti-infectives: Anti-infectives    Start     Dose/Rate Route Frequency Ordered Stop   05/09/14 1815  cefoTEtan (CEFOTAN) 1 g in dextrose 5 % 50 mL IVPB      1 g100 mL/hr over 30 Minutes Intravenous  Once 05/09/14 1805 05/09/14 1720   05/05/14 1900  piperacillin-tazobactam (ZOSYN) IVPB 3.375 g     3.375 g12.5 mL/hr over 240 Minutes Intravenous Every 8 hours 05/05/14 1533 05/08/14 1520   05/05/14 0600  cefUROXime (ZINACEF) 1.5 g in dextrose 5 % 50 mL IVPB     1.5 g100 mL/hr over 30 Minutes Intravenous On call to O.R. 05/04/14 1106 05/05/14 1345   05/03/14 0800  darunavir-cobicistat (PREZCOBIX) 800-150 MG per tablet 1 tablet  Status:  Discontinued     1 tablet Oral Daily with breakfast 05/02/14 1404 05/09/14 2051   05/01/14 1000  lamiVUDine (EPIVIR) 10 MG/ML solution 100 mg  Status:  Discontinued     100 mg Oral Daily 04/30/14 1354 05/09/14 2051   04/30/14 1000  zidovudine (RETROVIR) capsule 300 mg  Status:  Discontinued     300 mg Oral Every 12 hours 04/30/14 0922 05/09/14 2051   04/29/14 1800  atazanavir-cobicistat (Evotaz) 300-150 MG per tablet 1 tablet  Status:  Discontinued     1 tablet Oral Daily with breakfast 04/29/14 1408 05/02/14 1404   04/29/14 1000  cefTRIAXone (ROCEPHIN) 1 g in dextrose 5 % 50 mL IVPB - Premix  Status:  Discontinued     1 g100 mL/hr over 30 Minutes Intravenous Every 24 hours 04/29/14 0856 05/03/14 1214   04/28/14  1615  lamiVUDine (EPIVIR) tablet 150 mg  Status:  Discontinued     150 mg Oral Daily 04/28/14 1454 04/30/14 1354   04/28/14 1600  zidovudine (RETROVIR) tablet 300 mg  Status:  Discontinued     300 mg Oral 2 times daily 04/28/14 1454 04/30/14 0921      Assessment/Plan: s/p Procedure(s): EXPLORATORY LAPAROTOMY (N/A) DUODENOTOMY with Primary ligation Bleeding Duodenal Ulcer (N/A)  For UGI this am to evaluate repair and make sure no leak. If ok, will start clears hgb stable Cont bid PPI  Leighton Ruff. Redmond Pulling, MD, FACS General, Bariatric, & Minimally Invasive Surgery [facility], [place]   LOS: 16 days    Gayland Curry 05/14/2014

## 2014-05-14 NOTE — Evaluation (Signed)
Occupational Therapy Evaluation Patient Details Name: [name] [name] MRN: [id number] DOB: 14-Feb-1946 Today's Date: 05/14/2014    History of Present Illness 68 y/o male with HIV followed by Dr. [name] for an exudative effusion was admitted on 12/10 for pain in his abdomen and recurrence of the pleural effusion which had been sampled just three days prior. s/p VATS for lung biopsy 12/17. s/p EXPLORATORY LAPAROTOMY and DUODENOTOMY with Primary ligation Bleeding Duodenal Ulcer  12/21.   Clinical Impression   Pt admitted with above. He demonstrates the below listed deficits and will benefit from continued OT to maximize safety and independence with BADLs.  Pt presents to OT with generalized weakness and deconditioning as well as impaired balance.  Pt. Requires min - mod A with BADLs at this time.  Feel he would greatly benefit from CIR to allow him to return home safely at mod I level.  HR 97; 02 sats 96% on 6L supplemental 02; DOE 3/4.       Follow Up Recommendations  CIR    Equipment Recommendations       Recommendations for Other Services       Precautions / Restrictions Precautions Precautions: Other (comment) Precaution Comments: multiple lines; chest tube; JP drain (Rt side), foley,  rectal tube      Mobility Bed Mobility Overal bed mobility: Needs Assistance Bed Mobility: Supine to Sit     Supine to sit: Min assist     General bed mobility comments: assist to lift trunk from bed   Transfers Overall transfer level: Needs assistance Equipment used: Rolling walker (2 wheeled) Transfers: Sit to/from [facility] Sit to Stand: Min guard Stand pivot transfers: Min assist       General transfer comment: min A to steady during transfer    Balance Overall balance assessment: Needs assistance Sitting-balance support: Feet supported Sitting balance-Leahy Scale: Good     Standing balance support: Bilateral upper extremity supported Standing balance-Leahy  Scale: Poor                              ADL Overall ADL's : Needs assistance/impaired Eating/Feeding: NPO   Grooming: Wash/dry hands;Wash/dry face;Oral care;Brushing hair;Set up;Sitting   Upper Body Bathing: Minimal assitance;Sitting   Lower Body Bathing: Moderate assistance;Sit to/from stand   Upper Body Dressing : Minimal assistance;Sitting   Lower Body Dressing: Moderate assistance;Sit to/from stand Lower Body Dressing Details (indicate cue type and reason): Pt able to don socks with supervision EOB, but fatigues quickly requiring assist for remainder of ADLs Toilet Transfer: Minimal assistance;Stand-pivot;RW;BSC   Toileting- Clothing Manipulation and Hygiene: Moderate assistance;Sit to/from stand       Functional mobility during ADLs: Minimal assistance;Rolling walker General ADL Comments: Pt fatigues quickly this date with DOE 3/4     Vision                     Perception     Praxis      Pertinent Vitals/Pain Pain Assessment: No/denies pain     Hand Dominance Right   Extremity/Trunk Assessment Upper Extremity Assessment Upper Extremity Assessment: Generalized weakness;RUE deficits/detail;LUE deficits/detail RUE Deficits / Details: tremor bil. UEs LUE Deficits / Details: tremor bil. UEs   Lower Extremity Assessment Lower Extremity Assessment: Defer to PT evaluation   Cervical / Trunk Assessment Cervical / Trunk Assessment: Normal   Communication Communication Communication: No difficulties   Cognition Arousal/Alertness: Awake/alert Behavior During Therapy: WFL for tasks assessed/performed Overall  Cognitive Status: Within Functional Limits for tasks assessed                     General Comments       Exercises       Shoulder Instructions      Home Living Family/patient expects to be discharged to:: Inpatient rehab Living Arrangements: Alone Available Help at Discharge: Family;Available PRN/intermittently   Home  Access: Stairs to enter Entrance Stairs-Number of Steps: 1 Entrance Stairs-Rails: None Home Layout: One level               Home Equipment: Shower seat;Bedside commode;Grab bars - tub/shower   Additional Comments: tub/shower combo      Prior Functioning/Environment Level of Independence: Independent             OT Diagnosis: Generalized weakness   OT Problem List: Decreased strength;Decreased activity tolerance;Impaired balance (sitting and/or standing);Decreased safety awareness;Decreased knowledge of use of DME or AE;Cardiopulmonary status limiting activity   OT Treatment/Interventions: Self-care/ADL training;Therapeutic exercise;Therapeutic activities;Patient/family education;Balance training;DME and/or AE instruction;Energy conservation    OT Goals(Current goals can be found in the care plan section) Acute Rehab OT Goals Patient Stated Goal: to regain independence  OT Goal Formulation: With patient Time For Goal Achievement: 05/28/14 Potential to Achieve Goals: Good ADL Goals Pt Will Perform Grooming: with supervision;standing Pt Will Perform Upper Body Bathing: with set-up;sitting Pt Will Perform Lower Body Bathing: with supervision;sit to/from stand Pt Will Perform Upper Body Dressing: with set-up;sitting Pt Will Perform Lower Body Dressing: with supervision;sit to/from stand Pt Will Transfer to Toilet: with supervision;ambulating;regular height toilet;bedside commode;grab bars Pt Will Perform Toileting - Clothing Manipulation and hygiene: with supervision;sit to/from stand Pt/caregiver will Perform Home Exercise Program: Increased strength;With theraband;Both right and left upper extremity;With written HEP provided;Independently Additional ADL Goal #1: Pt will be able to participate in 25 mins therapeutic activity with no more than 2 rest breaks to increase endurance as needed for BASDLs  OT Frequency: Min 2X/week   Barriers to D/C: Decreased caregiver  support          Co-evaluation              End of Session Equipment Utilized During Treatment: Rolling walker;Oxygen Nurse Communication: Mobility status  Activity Tolerance: Patient limited by fatigue Patient left: in chair;with call bell/phone within reach   Time: 1216-1258 OT Time Calculation (min): 42 min Charges:  OT General Charges $OT Visit: 1 Procedure OT Evaluation $Initial OT Evaluation Tier I: 1 Procedure OT Treatments $Self Care/Home Management : 8-22 mins $Therapeutic Activity: 8-22 mins G-Codes:    Lola Lofaro M 05-21-14, 1:11 PM

## 2014-05-15 ENCOUNTER — Inpatient Hospital Stay (HOSPITAL_COMMUNITY): Payer: Commercial Managed Care - HMO

## 2014-05-15 LAB — CBC
HCT: 23.5 % — ABNORMAL LOW (ref 39.0–52.0)
HEMATOCRIT: 27.7 % — AB (ref 39.0–52.0)
Hemoglobin: 9.2 g/dL — ABNORMAL LOW (ref 13.0–17.0)
Hemoglobin: 7.7 g/dL — ABNORMAL LOW (ref 13.0–17.0)
MCH: 31 pg (ref 26.0–34.0)
MCH: 30.9 pg (ref 26.0–34.0)
MCHC: 33.2 g/dL (ref 30.0–36.0)
MCHC: 32.8 g/dL (ref 30.0–36.0)
MCV: 93.3 fL (ref 78.0–100.0)
MCV: 94.4 fL (ref 78.0–100.0)
Platelets: 218 10*3/uL (ref 150–400)
Platelets: 300 10*3/uL (ref 150–400)
RBC: 2.97 MIL/uL — AB (ref 4.22–5.81)
RBC: 2.49 MIL/uL — ABNORMAL LOW (ref 4.22–5.81)
RDW: 16.7 % — ABNORMAL HIGH (ref 11.5–15.5)
RDW: 16.9 % — AB (ref 11.5–15.5)
WBC: 9 10*3/uL (ref 4.0–10.5)
WBC: 13.6 10*3/uL — AB (ref 4.0–10.5)

## 2014-05-15 LAB — BASIC METABOLIC PANEL
ANION GAP: 6 (ref 5–15)
BUN: 48 mg/dL — ABNORMAL HIGH (ref 6–23)
CALCIUM: 6.7 mg/dL — AB (ref 8.4–10.5)
CHLORIDE: 122 meq/L — AB (ref 96–112)
CO2: 21 mmol/L (ref 19–32)
Creatinine, Ser: 1.49 mg/dL — ABNORMAL HIGH (ref 0.50–1.35)
GFR calc Af Amer: 55 mL/min — ABNORMAL LOW (ref >90)
GFR calc non Af Amer: 47 mL/min — ABNORMAL LOW (ref >90)
Glucose, Bld: 107 mg/dL — ABNORMAL HIGH (ref 70–99)
Potassium: 3.3 mmol/L — ABNORMAL LOW (ref 3.5–5.1)
SODIUM: 149 mmol/L — AB (ref 135–145)

## 2014-05-15 LAB — GLUCOSE, CAPILLARY
GLUCOSE-CAPILLARY: 127 mg/dL — AB (ref 70–99)
GLUCOSE-CAPILLARY: 158 mg/dL — AB (ref 70–99)
Glucose-Capillary: 145 mg/dL — ABNORMAL HIGH (ref 70–99)
Glucose-Capillary: 158 mg/dL — ABNORMAL HIGH (ref 70–99)
Glucose-Capillary: 126 mg/dL — ABNORMAL HIGH (ref 70–99)
Glucose-Capillary: 138 mg/dL — ABNORMAL HIGH (ref 70–99)
Glucose-Capillary: 122 mg/dL — ABNORMAL HIGH (ref 70–99)

## 2014-05-15 LAB — HEMOGLOBIN AND HEMATOCRIT, BLOOD
HCT: 31.9 % — ABNORMAL LOW (ref 39.0–52.0)
Hemoglobin: 10.2 g/dL — ABNORMAL LOW (ref 13.0–17.0)

## 2014-05-15 MED ORDER — FAT EMULSION 20 % IV EMUL
250.0000 mL | INTRAVENOUS | Status: AC
  Administered 2014-05-15: 250 mL via INTRAVENOUS
  Filled 2014-05-15: qty 250

## 2014-05-15 MED ORDER — POTASSIUM CHLORIDE 10 MEQ/50ML IV SOLN
10.0000 meq | INTRAVENOUS | Status: AC
  Administered 2014-05-15 (×3): 10 meq via INTRAVENOUS
  Filled 2014-05-15 (×2): qty 50

## 2014-05-15 MED ORDER — TRACE MINERALS CR-CU-F-FE-I-MN-MO-SE-ZN IV SOLN
INTRAVENOUS | Status: AC
  Administered 2014-05-15: 18:00:00 via INTRAVENOUS
  Filled 2014-05-15: qty 2000

## 2014-05-15 NOTE — Progress Notes (Signed)
Patient ID: [name] [name], male   DOB: 04/25/1946, 68 y.o.   MRN: [id number]   LOS: 17 days   POD#6  Subjective: Doing well.   Objective: Vital signs in last 24 hours: Temp:  [97.4 F (36.3 C)-98.3 F (36.8 C)] 98.2 F (36.8 C) (12/27 0438) Pulse Rate:  [79-118] 118 (12/27 0438) Resp:  [17-24] 24 (12/27 0438) BP: (108-112)/(50-64) 111/56 mmHg (12/27 0438) SpO2:  [93 %-99 %] 93 % (12/27 0438) Last BM Date: 05/13/14   JP: 66ml/24h   Laboratory  CBC  Recent Labs  05/14/14 1840 05/15/14 0300  WBC 9.4 9.0  HGB 11.2* 9.2*  HCT 33.6* 27.7*  PLT 251 218   BMET  Recent Labs  05/14/14 0420 05/15/14 0320  NA 145 149*  K 3.7 3.3*  CL 114* 122*  CO2 23 21  GLUCOSE 102* 107*  BUN 53* 48*  CREATININE 1.98* 1.49*  CALCIUM 8.1* 6.7*    Physical Exam General appearance: alert and no distress Resp: rhonchi RUL Cardio: regular rate and rhythm GI: Soft, +BS, incision clean with granulation   Assessment/Plan: s/p Procedure(s): EXPLORATORY LAPAROTOMY (N/A) DUODENOTOMY with Primary ligation Bleeding Duodenal Ulcer (N/A)  Small contained leak on UGI, suspect can start clears, will check with MD. hgb down today Cont bid PPI    [name] Abu, PA-C Pager: [phone] 05/15/2014

## 2014-05-15 NOTE — Progress Notes (Signed)
PARENTERAL NUTRITION CONSULT NOTE - FOLLOW UP  Pharmacy Consult for TPN Indication: Prolonged ileus  No Known Allergies  Patient Measurements: Height: 5\' 6"  (167.6 cm) Weight: 125 lb 7.1 oz (56.9 kg) IBW/kg (Calculated) : 63.8 Adjusted Body Weight:  Usual Weight:   Vital Signs: Temp: 98.2 F (36.8 C) (12/27 0438) Temp Source: Oral (12/27 0438) BP: 111/56 mmHg (12/27 0438) Pulse Rate: 118 (12/27 0438) Intake/Output from previous day: 12/26 0701 - 12/27 0700 In: 1928.6 [IV Piggyback:100; TPN:1828.6] Out: 2215 [TOIZT:2458; Drains:90; Chest Tube:150] Intake/Output from this shift: Total I/O In: -  Out: 190 [Urine:175; Drains:15]  Labs:  Recent Labs  05/14/14 1000 05/14/14 1840 05/15/14 0300  WBC 7.3 9.4 9.0  HGB 10.6* 11.2* 9.2*  HCT 31.8* 33.6* 27.7*  PLT 230 251 218     Recent Labs  05/13/14 0621 05/14/14 0420 05/15/14 0320  NA 143 145 149*  K 3.2* 3.7 3.3*  CL 114* 114* 122*  CO2 18* 23 21  GLUCOSE 116* 102* 107*  BUN 50* 53* 48*  CREATININE 2.10* 1.98* 1.49*  CALCIUM 7.9* 8.1* 6.7*  TRIG  --  217*  --    Estimated Creatinine Clearance: 39.2 mL/min (by C-G formula based on Cr of 1.49).    Recent Labs  05/15/14 05/15/14 0426 05/15/14 0818  GLUCAP 158* 145* 158*    Medications:  Scheduled:  . antiseptic oral rinse  7 mL Mouth Rinse q12n4p  . chlorhexidine  15 mL Mouth Rinse BID  . insulin aspart  0-15 Units Subcutaneous 6 times per day  . pantoprazole (PROTONIX) IV  40 mg Intravenous Q12H  . potassium chloride  10 mEq Intravenous Q1 Hr x 3    Insulin Requirements in the past 24 hours: 5 units mod SSI; 12 units regular insulin in TPN bag  Nutritional Goals:  1900-2100 kCal, 85-100 grams of protein per day  Current Nutrition:  Clinimix E 5/20 @ 75 ml/hr with 20%lipids @ 61ml/hr will provide 90 gm protein and 2064 Kcal/day  Assessment: 68 yo man on TPN with serosanguinous drainage from JP, (+)abd distention with open wound.   GI:  S/P duodenotomy with primary ligation of bleeding duodenal ulcer on 12/19. (+)JP drain-> 74mL/24h. Prealbumin 9.7 (low at baseline -likely due to recent surgery). Abd soft, mild distention. Lg, loose, bloody BM 12/26, none further documented. Remains NPO. UGI planned.  PPI-IV bid  Endo: No hx DM. Moderate SSI and insulin in TPN, CBG 115-158 (received Clinimix-E 5/20 last PM).  Lytes: K 3.3- K runs x 3 ordered  Renal: Cr 1.49, UOP 1.16ml/kg/hr, I/O ~even  Pulm: S/P VATS/decort of empyema with talc pleurodesis.4L-[place].   Cards: BP low-nl, sinus tachy  Hepatobil: LFTs wnl, TG 273  Neuro:A&O. Pain controlled  ID: HIV meds remain on hold. WBC wnl, AFeb  Best Practices: SCDs  TPN Access: 12/21 R IJ CVC  TPN day#: 12/22 >>  Plan:  - Continue Clinimix E 5/15 at 75 ml/hr and 20% lipids at 37ml/hr. This provides 100% of estimated needs. - KCl 31mEq/50ml q1h x 3 runs - Continue insulin in TPN (12 units/bag, pt receiving ~11 units)  Gracy Bruins, [facility]

## 2014-05-15 NOTE — Progress Notes (Addendum)
[facility] PROGRESS NOTE  [name] [name] RKY:[id number] DOB: [birth year] DOA: 04/28/2014 PCP: Cloyd Stagers, MD  Assessment/Plan: Principal Problem:  GI bleed - General surgery on board. UGI evaluation completed and reportedly showing small contained leak at upper end of duodenal suture line closure patient will be allowed limited clear liquid - will monitor cbc's. Monitor in ICU - Rectal tube inserted to assess better output  Active Problems:   Human immunodeficiency virus (HIV) disease - ID on board and will manage    Hypokalemia - on TPN will defer replacement to pharmacy     Recurrent pleural effusion on right - Pt is s/p VATs with pleurodesis - Malignant effusion    AKI (acute kidney injury) - Improving most likely elevated secondary to GI bleed - Will continue to monitor cbc and S creatinine    Protein-calorie malnutrition, severe - Patient currently on TPN    Addendum: SOB - see below, chest tube removed    Acute respiratory failure with hypoxia - Chest tube removed - critical care team has signed off - Cardiothoracic team on board   Code Status: presumed full Family Communication:  None at bedside Disposition Plan: Pending improvement in condition   Consultants:  CT  General surgery  Procedures: 12/7 Outpatient thoracentesis by IR> exudative, cytology negative 12/10 abdominal ultrasound> 13 mm gallstone, mild tenderness over gallbladder without gallbladder thickening, possible left upper pole renal calculus, right pleural effusion noted 12/10 renal ultrasound> no hydronephrosis 12/11 HIDA scan> patent cystic duct without evidence of acute cholecystitis 12/11 Echo> LVEF 65%, RV normal. 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  Antibiotics:  None  HPI/Subjective: Pt has no new  complaints. No acute issues overnight reported  Objective: Filed Vitals:   05/15/14 1241  BP:   Pulse:   Temp: 97.4 F (36.3 C)  Resp:     Intake/Output Summary (Last 24 hours) at 05/15/14 1248 Last data filed at 05/15/14 1241  Gross per 24 hour  Intake 1403.58 ml  Output   2000 ml  Net -596.42 ml   Filed Weights   05/03/14 2205 05/05/14 1830 05/09/14 0530  Weight: 56.836 kg (125 lb 4.8 oz) 55.5 kg (122 lb 5.7 oz) 56.9 kg (125 lb 7.1 oz)    Exam:   General:  Pt in nad, alert and awake  Cardiovascular: rrr, no mrg  Respiratory: no wheezes, breathing comfortably on RA  Abdomen: ND, no guarding  Musculoskeletal: no cyanosis or clubbing   Data Reviewed: Basic Metabolic Panel:  Recent Labs Lab 05/10/14 1028 05/11/14 0500 05/12/14 0214 05/13/14 0621 05/14/14 0420 05/15/14 0320  NA  --  141 145 143 145 149*  K  --  3.8 4.3 3.2* 3.7 3.3*  CL  --  115* 116* 114* 114* 122*  CO2  --  19 23 18* 23 21  GLUCOSE  --  148* 94 116* 102* 107*  BUN  --  38* 41* 50* 53* 48*  CREATININE  --  2.31* 2.35* 2.10* 1.98* 1.49*  CALCIUM  --  7.5* 8.1* 7.9* 8.1* 6.7*  MG 1.6 1.7 1.9  --   --   --   PHOS 5.4* 4.1 4.2  --   --   --    Liver Function Tests:  Recent Labs Lab 05/09/14 0325 05/11/14 0500 05/12/14 0214  AST 16 43* 35  ALT 10 37 30  ALKPHOS 50 61 76  BILITOT <0.2* 0.7 0.5  PROT 4.0* 4.0* 4.4*  ALBUMIN 1.0* 1.7* 1.6*   No results for input(s): LIPASE, AMYLASE in the last 168 hours. No results for input(s): AMMONIA in the last 168 hours. CBC:  Recent Labs Lab 05/13/14 1800 05/14/14 0420 05/14/14 1000 05/14/14 1840 05/15/14 0300  WBC 7.3 7.7 7.3 9.4 9.0  HGB 11.1* 11.0* 10.6* 11.2* 9.2*  HCT 32.4* 32.9* 31.8* 33.6* 27.7*  MCV 89.8 89.6 90.1 92.1 93.3  PLT 238 230 230 251 218   Cardiac Enzymes: No results for input(s): CKTOTAL, CKMB, CKMBINDEX, TROPONINI in the last 168 hours. BNP (last 3 results)  Recent Labs  04/21/14 1131  PROBNP 25.0    CBG:  Recent Labs Lab 05/14/14 1619 05/14/14 1950 05/15/14 05/15/14 0426 05/15/14 0818  GLUCAP 123* 115* 158* 145* 158*    Recent Results (from the past 240 hour(s))  Anaerobic culture     Status: None   Collection Time: 05/05/14  3:08 PM  Result Value Ref Range Status   Specimen Description FLUID PLEURAL RIGHT  Final   Special Requests NONE  Final   Gram Stain   Final    NO WBC SEEN NO ORGANISMS SEEN Performed at [facility]    Culture   Final    NO ANAEROBES ISOLATED Performed at [facility]    Report Status 05/10/2014 FINAL  Final  Body fluid culture     Status: None   Collection Time: 05/05/14  3:08 PM  Result Value Ref Range Status   Specimen Description FLUID PLEURAL RIGHT  Final   Special Requests NONE  Final   Gram Stain   Final    NO WBC SEEN NO ORGANISMS SEEN Performed at [facility]    Culture   Final    NO GROWTH 3 DAYS Performed at [facility]    Report Status 05/08/2014 FINAL  Final     Studies: Dg Chest Port 1 View  05/15/2014   CLINICAL DATA:  Shortness of breath, history of lung cancer, HIV  EXAM: PORTABLE CHEST - 1 VIEW  COMPARISON:  05/13/2014  FINDINGS: Cardiomediastinal silhouette is stable. The right chest tube has been removed. Small residual loculated right basilar and right lower lateral hydro pneumothorax. Persistent consolidation in right lower lobe. Stable streaky airspace opacification in right upper lobe. Improvement in aeration left base. Left lung is clear. No pulmonary edema. Stable right IJ catheter.  IMPRESSION: The right chest tube has been removed. Small residual loculated right basilar and right lower lateral hydro pneumothorax. Persistent consolidation in right lower lobe. Stable streaky airspace opacification in right upper lobe. Improvement in aeration left base.   Electronically Signed   By: Lahoma Crocker M.D.   On: 05/15/2014 10:22   Dg Ugi W/high Density W/kub  05/14/2014   CLINICAL  DATA:  Duodenal ulcer post surgical repair  EXAM: WATER SOLUBLE UPPER GI SERIES  TECHNIQUE: Single-column upper GI series was performed using water soluble contrast.  CONTRAST:  Omnipaque 300  COMPARISON:  None  FLUOROSCOPY TIME:  1 min 0 seconds  FINDINGS: Normal esophageal distention and motility without obstruction.  Stomach distends normally with normal appearance of rugal folds.  No mass, ulceration or outlet obstruction.  Small gas collection is identified adjacent first portion of the duodenum, eccentrically located cranially, does not conform to expected contour of the duodenal bulb, suspect ulcer crater.  After placing the patient in the RIGHT lateral decubitus position and then returning supine, small amount of  contrast is seen within this collection indicating continued communication with the duodenal lumen.  Second and third portions the duodenum are grossly normal appearance.  Visualized jejunal loops normal appearance.  IMPRESSION: A small amount of contrast extends external to the course of the duodenum into a small contained gas collection cranial to the duodenum likely representing communication with duodenal ulcer crater ; contrast is contained however and does not extend beyond this collection into the retroperitoneum.   Electronically Signed   By: Lavonia Dana M.D.   On: 05/14/2014 18:59    Scheduled Meds: . antiseptic oral rinse  7 mL Mouth Rinse q12n4p  . chlorhexidine  15 mL Mouth Rinse BID  . insulin aspart  0-15 Units Subcutaneous 6 times per day  . pantoprazole (PROTONIX) IV  40 mg Intravenous Q12H   Continuous Infusions: . Marland KitchenTPN (CLINIMIX-E) Adult 75 mL/hr at 05/15/14 0600   And  . fat emulsion 250 mL (05/15/14 0600)  . Marland KitchenTPN (CLINIMIX-E) Adult     And  . fat emulsion       Time spent: > 35 minutes    Velvet Bathe  [facility] Pager [phone]. If 7PM-7AM, please contact night-coverage at [email or web address], password [facility] 05/15/2014, 12:48 PM  LOS: 17 days

## 2014-05-15 NOTE — Progress Notes (Addendum)
Patient had periods were sats would drop in the mid-upper 80s on 4L [place], patient's lung sounds were diminished, wet, and rhonchous, patient has thick-mucus secretions that he has been coughing up when prompted and after several minutes of coughing, deep-breathing, and using his IS (500-750), sats did improve in the mid 90s.  RT was called to give patient breathing treatment bc patient was wheezing at times.

## 2014-05-15 NOTE — Progress Notes (Signed)
Pt rhonchus through out desaturating to 83 % on 4 liters then 86 percent on 6 liters pt the placed on 40 % venturi mask , Hr 119, Respirations 30's to 40's BP 90/ 52 manually HGB dropped from 11.4 on 14 May 1829 to 7.4  15 May 1529 page Jyl Heinz NP to notify of the above she said she would be up on the floor as soon as possible to assess. Day shift nurse NTS x 2 and pt hr decrease to 96 saturation 100% on 40% venturi mask will continue to monitor.

## 2014-05-15 NOTE — Progress Notes (Signed)
[facility] DAILY ICU PROGRESS NOTE                   [address]            [place] [address]          [phone]   6 Days Post-Op Procedure(s) (LRB): EXPLORATORY LAPAROTOMY (N/A) DUODENOTOMY with Primary ligation Bleeding Duodenal Ulcer (N/A)  Total Length of Stay:  LOS: 17 days   Subjective: Comfortable, no new complaints.  Breathing stable. Pain controlled. Confused today   .  Objective: Vital signs in last 24 hours: Temp:  [97.4 F (36.3 C)-98.3 F (36.8 C)] 98.2 F (36.8 C) (12/27 0438) Pulse Rate:  [79-118] 118 (12/27 0438) Cardiac Rhythm:  [-] Sinus tachycardia (12/26 1954) Resp:  [17-24] 24 (12/27 0438) BP: (108-112)/(50-64) 111/56 mmHg (12/27 0438) SpO2:  [93 %-99 %] 93 % (12/27 0438)  Filed Weights   05/03/14 2205 05/05/14 1830 05/09/14 0530  Weight: 125 lb 4.8 oz (56.836 kg) 122 lb 5.7 oz (55.5 kg) 125 lb 7.1 oz (56.9 kg)    Weight change:    Hemodynamic parameters for last 24 hours:    Intake/Output from previous day: 12/26 0701 - 12/27 0700 In: 1928.6 [IV Piggyback:100; TPN:1828.6] Out: 2215 [Urine:1975; Drains:90; Chest Tube:150]  Intake/Output this shift: Total I/O In: -  Out: 190 [Urine:175; Drains:15]  Current Meds: Scheduled Meds: . antiseptic oral rinse  7 mL Mouth Rinse q12n4p  . chlorhexidine  15 mL Mouth Rinse BID  . insulin aspart  0-15 Units Subcutaneous 6 times per day  . pantoprazole (PROTONIX) IV  40 mg Intravenous Q12H  . potassium chloride  10 mEq Intravenous Q1 Hr x 3   Continuous Infusions: . Marland KitchenTPN (CLINIMIX-E) Adult 75 mL/hr at 05/15/14 0600   And  . fat emulsion 250 mL (05/15/14 0600)   PRN Meds:.fentaNYL, levalbuterol, ondansetron (ZOFRAN) IV, sodium chloride  Physical Exam: General appearance: alert, cooperative and no distress Heart: regular rate and rhythm Lungs: Decreased BS in bases Wound: Dressed and dry  Chest tube: No air leak   Lab Results: CBC:  Recent Labs  05/14/14 1840  05/15/14 0300  WBC 9.4 9.0  HGB 11.2* 9.2*  HCT 33.6* 27.7*  PLT 251 218   BMET:   Recent Labs  05/14/14 0420 05/15/14 0320  NA 145 149*  K 3.7 3.3*  CL 114* 122*  CO2 23 21  GLUCOSE 102* 107*  BUN 53* 48*  CREATININE 1.98* 1.49*  CALCIUM 8.1* 6.7*    PT/INR: No results for input(s): LABPROT, INR in the last 72 hours. Radiology: Scherrie Merritts W/high Density W/kub  05/14/2014   CLINICAL DATA:  Duodenal ulcer post surgical repair  EXAM: WATER SOLUBLE UPPER GI SERIES  TECHNIQUE: Single-column upper GI series was performed using water soluble contrast.  CONTRAST:  Omnipaque 300  COMPARISON:  None  FLUOROSCOPY TIME:  1 min 0 seconds  FINDINGS: Normal esophageal distention and motility without obstruction.  Stomach distends normally with normal appearance of rugal folds.  No mass, ulceration or outlet obstruction.  Small gas collection is identified adjacent first portion of the duodenum, eccentrically located cranially, does not conform to expected contour of the duodenal bulb, suspect ulcer crater.  After placing the patient in the RIGHT lateral decubitus position and then returning supine, small amount of contrast is seen within this collection indicating continued communication with the duodenal lumen.  Second and third portions the duodenum are grossly normal appearance.  Visualized jejunal loops  normal appearance.  IMPRESSION: A small amount of contrast extends external to the course of the duodenum into a small contained gas collection cranial to the duodenum likely representing communication with duodenal ulcer crater ; contrast is contained however and does not extend beyond this collection into the retroperitoneum.   Electronically Signed   By: Lavonia Dana M.D.   On: 05/14/2014 18:59  05/15/2014  Path: Diagnosis 1. Soft tissue mass, simple excision, right pleural - POORLY DIFFERENTIATED MALIGNANCY. - SEE MICROSCOPIC DESCRIPTION. 2. Lung, biopsy, right lower lobe - POORLY DIFFERENTIATED  MALIGNANCY. - SEE MICROSCOPIC DESCRIPTION. 3. Pleura, peel, right - POORLY DIFFERENTIATED MALIGNANCY. - SEE MICROSCOPIC DESCRIPTION. Microscopic Comment 1. -3. All of these specimens are involved by a poorly differentiated malignancy characterized by sheets of cells with enlarged nuclei with nucleoli and frequent mitotic figures. There are large areas of tumor necrosis present. Immunohistochemistry is performed and the tumor shows positivity with cytokeratin 7, CD10, BCL-2, MOC-31 and Vimentin. The tumor is negative with an extensive battery of immunohistochemical stains including thyroid transcription factor-1, Napsin-A, S100, HMB45, Melan-A, carcinoembryonic antigen, CD99, WT-1, D2-40, Desmin, epithelial membrane antigen, Hepar-1, Chromogranin, Synaptophysin, cytokeratin 5/6, CD45 (LCA),CD56, CD20, CD3, CD43, CD79a, Cytokeratin 20, prostate specific antigen, prostein and p63. The immunophenotype is not specific as to differentiation or possible primary and the tumor will be submitted to [facility] for tumor of unknown type genotyping. Case discussed with Dr. Prescott Gum on 05/10/14 and 05/12/14. (  Assessment/Plan: S/P Procedure(s) (LRB): EXPLORATORY LAPAROTOMY (N/A) DUODENOTOMY with Primary ligation Bleeding Duodenal Ulcer (N/A) Stable from thoracic surgery standpoint but overall weak Ct out  Continue current care per CCS, CCM. Final pathology on lung tumor is pending. 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

## 2014-05-15 NOTE — Progress Notes (Signed)
Oxygen sats did drop again to mid 80s, and after increasing [place] to 6L, did not improve.  Night RN paged [facility], awaiting for them to reassess patient.  Sats did improve after NTS x 2 by Day RN and patient was placed on 40% venti mask prior to being NTS by night RN.    NTS- thick tenacious secretions were suctioned of out patient.

## 2014-05-16 ENCOUNTER — Inpatient Hospital Stay (HOSPITAL_COMMUNITY): Payer: Commercial Managed Care - HMO

## 2014-05-16 ENCOUNTER — Telehealth: Payer: Self-pay | Admitting: Internal Medicine

## 2014-05-16 LAB — DIFFERENTIAL
Basophils Absolute: 0 10*3/uL (ref 0.0–0.1)
Basophils Relative: 0 % (ref 0–1)
EOS PCT: 2 % (ref 0–5)
Eosinophils Absolute: 0.2 10*3/uL (ref 0.0–0.7)
Lymphocytes Relative: 7 % — ABNORMAL LOW (ref 12–46)
Lymphs Abs: 0.7 10*3/uL (ref 0.7–4.0)
Monocytes Absolute: 1.5 10*3/uL — ABNORMAL HIGH (ref 0.1–1.0)
Monocytes Relative: 14 % — ABNORMAL HIGH (ref 3–12)
Neutro Abs: 7.9 10*3/uL — ABNORMAL HIGH (ref 1.7–7.7)
Neutrophils Relative %: 77 % (ref 43–77)

## 2014-05-16 LAB — GLUCOSE, CAPILLARY
GLUCOSE-CAPILLARY: 113 mg/dL — AB (ref 70–99)
GLUCOSE-CAPILLARY: 121 mg/dL — AB (ref 70–99)
Glucose-Capillary: 137 mg/dL — ABNORMAL HIGH (ref 70–99)
Glucose-Capillary: 106 mg/dL — ABNORMAL HIGH (ref 70–99)
Glucose-Capillary: 104 mg/dL — ABNORMAL HIGH (ref 70–99)

## 2014-05-16 LAB — CBC
HEMATOCRIT: 31.3 % — AB (ref 39.0–52.0)
HEMOGLOBIN: 10 g/dL — AB (ref 13.0–17.0)
MCH: 30.2 pg (ref 26.0–34.0)
MCHC: 31.9 g/dL (ref 30.0–36.0)
MCV: 94.6 fL (ref 78.0–100.0)
Platelets: 227 10*3/uL (ref 150–400)
RBC: 3.31 MIL/uL — ABNORMAL LOW (ref 4.22–5.81)
RDW: 18 % — ABNORMAL HIGH (ref 11.5–15.5)
WBC: 10.2 10*3/uL (ref 4.0–10.5)

## 2014-05-16 LAB — COMPREHENSIVE METABOLIC PANEL
ALBUMIN: 1.4 g/dL — AB (ref 3.5–5.2)
ALT: 63 U/L — ABNORMAL HIGH (ref 0–53)
AST: 52 U/L — ABNORMAL HIGH (ref 0–37)
Alkaline Phosphatase: 143 U/L — ABNORMAL HIGH (ref 39–117)
Anion gap: 6 (ref 5–15)
BUN: 56 mg/dL — ABNORMAL HIGH (ref 6–23)
CALCIUM: 8 mg/dL — AB (ref 8.4–10.5)
CO2: 24 mmol/L (ref 19–32)
Chloride: 114 mEq/L — ABNORMAL HIGH (ref 96–112)
Creatinine, Ser: 1.66 mg/dL — ABNORMAL HIGH (ref 0.50–1.35)
GFR calc Af Amer: 48 mL/min — ABNORMAL LOW (ref >90)
GFR calc non Af Amer: 41 mL/min — ABNORMAL LOW (ref >90)
Glucose, Bld: 131 mg/dL — ABNORMAL HIGH (ref 70–99)
POTASSIUM: 4.5 mmol/L (ref 3.5–5.1)
SODIUM: 144 mmol/L (ref 135–145)
Total Bilirubin: 0.4 mg/dL (ref 0.3–1.2)
Total Protein: 4.6 g/dL — ABNORMAL LOW (ref 6.0–8.3)

## 2014-05-16 LAB — BLOOD GAS, ARTERIAL
ACID-BASE DEFICIT: 2.1 mmol/L — AB (ref 0.0–2.0)
Bicarbonate: 22 mEq/L (ref 20.0–24.0)
DRAWN BY: 27733
O2 CONTENT: 4 L/min
O2 Saturation: 92.3 %
PCO2 ART: 35.9 mmHg (ref 35.0–45.0)
PH ART: 7.402 (ref 7.350–7.450)
PO2 ART: 64.2 mmHg — AB (ref 80.0–100.0)
Patient temperature: 98
TCO2: 23.1 mmol/L (ref 0–100)

## 2014-05-16 LAB — MAGNESIUM: Magnesium: 2.1 mg/dL (ref 1.5–2.5)

## 2014-05-16 LAB — PHOSPHORUS: Phosphorus: 5.1 mg/dL — ABNORMAL HIGH (ref 2.3–4.6)

## 2014-05-16 LAB — PREALBUMIN: Prealbumin: 9.7 mg/dL — ABNORMAL LOW (ref 17.0–34.0)

## 2014-05-16 LAB — TRIGLYCERIDES: TRIGLYCERIDES: 132 mg/dL (ref <150)

## 2014-05-16 MED ORDER — IPRATROPIUM-ALBUTEROL 0.5-2.5 (3) MG/3ML IN SOLN
3.0000 mL | RESPIRATORY_TRACT | Status: DC
  Administered 2014-05-16 – 2014-05-17 (×4): 3 mL via RESPIRATORY_TRACT
  Filled 2014-05-16 (×6): qty 3

## 2014-05-16 MED ORDER — ALBUTEROL SULFATE (2.5 MG/3ML) 0.083% IN NEBU
INHALATION_SOLUTION | RESPIRATORY_TRACT | Status: AC
  Filled 2014-05-16: qty 3

## 2014-05-16 MED ORDER — BOOST / RESOURCE BREEZE PO LIQD
1.0000 | Freq: Three times a day (TID) | ORAL | Status: DC
  Administered 2014-05-16 – 2014-05-22 (×10): 1 via ORAL

## 2014-05-16 MED ORDER — ZOLPIDEM TARTRATE 5 MG PO TABS
5.0000 mg | ORAL_TABLET | Freq: Every evening | ORAL | Status: DC | PRN
  Administered 2014-05-18 – 2014-05-22 (×5): 5 mg via ORAL
  Filled 2014-05-16 (×5): qty 1

## 2014-05-16 MED ORDER — SODIUM CHLORIDE 0.9 % IV BOLUS (SEPSIS)
500.0000 mL | Freq: Once | INTRAVENOUS | Status: AC
  Administered 2014-05-16: 500 mL via INTRAVENOUS

## 2014-05-16 MED ORDER — TRACE MINERALS CR-CU-F-FE-I-MN-MO-SE-ZN IV SOLN
INTRAVENOUS | Status: AC
  Administered 2014-05-16: 17:00:00 via INTRAVENOUS
  Filled 2014-05-16: qty 2000

## 2014-05-16 MED ORDER — FAT EMULSION 20 % IV EMUL
250.0000 mL | INTRAVENOUS | Status: AC
  Administered 2014-05-16: 250 mL via INTRAVENOUS
  Filled 2014-05-16: qty 250

## 2014-05-16 NOTE — Progress Notes (Signed)
7 Days Post-Op  Subjective: Some SOB today, not much appetite  Objective: Vital signs in last 24 hours: Temp:  [96.9 F (36.1 C)-98.4 F (36.9 C)] 98 F (36.7 C) (12/28 0700) Pulse Rate:  [83-122] 122 (12/28 0700) Resp:  [18-47] 19 (12/28 0700) BP: (90-110)/(52-68) 100/64 mmHg (12/28 0700) SpO2:  [86 %-99 %] 94 % (12/28 0700) Last BM Date: 05/15/14  Intake/Output from previous day: 12/27 0701 - 12/28 0700 In: 2085 [P.O.:120; TPN:1965] Out: 2205 [Urine:2050; Drains:155] Intake/Output this shift: Total I/O In: 85 [TPN:85] Out: 225 [Urine:225]  General appearance: cooperative Resp: wheezes bilaterally Cardio: regular rate and rhythm GI: soft, wound dressing just changed, JP serosang and low output  Lab Results:   Recent Labs  05/15/14 1500 05/15/14 2055 05/16/14 0300  WBC 13.6*  --  10.2  HGB 7.7* 10.2* 10.0*  HCT 23.5* 31.9* 31.3*  PLT 300  --  227   BMET  Recent Labs  05/15/14 0320 05/16/14 0300  NA 149* 144  K 3.3* 4.5  CL 122* 114*  CO2 21 24  GLUCOSE 107* 131*  BUN 48* 56*  CREATININE 1.49* 1.66*  CALCIUM 6.7* 8.0*   PT/INR No results for input(s): LABPROT, INR in the last 72 hours. ABG No results for input(s): PHART, HCO3 in the last 72 hours.  Invalid input(s): PCO2, PO2  Studies/Results: Dg Chest Port 1 View  05/16/2014   CLINICAL DATA:  Shortness of breath.  EXAM: PORTABLE CHEST - 1 VIEW  COMPARISON:  05/15/2014.  FINDINGS: Right IJ line in stable position. Heart size stable. Pulmonary vascularity normal. Stable right base tiny loculated pneumothorax. Stable right base atelectasis and/or infiltrate and right pleural effusion.  IMPRESSION: 1. Stable residual loculated right base pneumothorax. 2. Stable right base atelectasis and/or infiltrate with right pleural effusion. 3. Right IJ line in stable position.   Electronically Signed   By: Marcello Moores  Register   On: 05/16/2014 09:16   Dg Chest Port 1 View  05/15/2014   CLINICAL DATA:  Shortness of  breath, history of lung cancer, HIV  EXAM: PORTABLE CHEST - 1 VIEW  COMPARISON:  05/13/2014  FINDINGS: Cardiomediastinal silhouette is stable. The right chest tube has been removed. Small residual loculated right basilar and right lower lateral hydro pneumothorax. Persistent consolidation in right lower lobe. Stable streaky airspace opacification in right upper lobe. Improvement in aeration left base. Left lung is clear. No pulmonary edema. Stable right IJ catheter.  IMPRESSION: The right chest tube has been removed. Small residual loculated right basilar and right lower lateral hydro pneumothorax. Persistent consolidation in right lower lobe. Stable streaky airspace opacification in right upper lobe. Improvement in aeration left base.   Electronically Signed   By: Lahoma Crocker M.D.   On: 05/15/2014 10:22   Dg Ugi W/high Density W/kub  05/14/2014   CLINICAL DATA:  Duodenal ulcer post surgical repair  EXAM: WATER SOLUBLE UPPER GI SERIES  TECHNIQUE: Single-column upper GI series was performed using water soluble contrast.  CONTRAST:  Omnipaque 300  COMPARISON:  None  FLUOROSCOPY TIME:  1 min 0 seconds  FINDINGS: Normal esophageal distention and motility without obstruction.  Stomach distends normally with normal appearance of rugal folds.  No mass, ulceration or outlet obstruction.  Small gas collection is identified adjacent first portion of the duodenum, eccentrically located cranially, does not conform to expected contour of the duodenal bulb, suspect ulcer crater.  After placing the patient in the RIGHT lateral decubitus position and then returning supine, small amount of  contrast is seen within this collection indicating continued communication with the duodenal lumen.  Second and third portions the duodenum are grossly normal appearance.  Visualized jejunal loops normal appearance.  IMPRESSION: A small amount of contrast extends external to the course of the duodenum into a small contained gas collection  cranial to the duodenum likely representing communication with duodenal ulcer crater ; contrast is contained however and does not extend beyond this collection into the retroperitoneum.   Electronically Signed   By: Lavonia Dana M.D.   On: 05/14/2014 18:59    Anti-infectives: Anti-infectives    Start     Dose/Rate Route Frequency Ordered Stop   05/09/14 1815  cefoTEtan (CEFOTAN) 1 g in dextrose 5 % 50 mL IVPB     1 g100 mL/hr over 30 Minutes Intravenous  Once 05/09/14 1805 05/09/14 1720   05/05/14 1900  piperacillin-tazobactam (ZOSYN) IVPB 3.375 g     3.375 g12.5 mL/hr over 240 Minutes Intravenous Every 8 hours 05/05/14 1533 05/08/14 1520   05/05/14 0600  cefUROXime (ZINACEF) 1.5 g in dextrose 5 % 50 mL IVPB     1.5 g100 mL/hr over 30 Minutes Intravenous On call to O.R. 05/04/14 1106 05/05/14 1345   05/03/14 0800  darunavir-cobicistat (PREZCOBIX) 800-150 MG per tablet 1 tablet  Status:  Discontinued     1 tablet Oral Daily with breakfast 05/02/14 1404 05/09/14 2051   05/01/14 1000  lamiVUDine (EPIVIR) 10 MG/ML solution 100 mg  Status:  Discontinued     100 mg Oral Daily 04/30/14 1354 05/09/14 2051   04/30/14 1000  zidovudine (RETROVIR) capsule 300 mg  Status:  Discontinued     300 mg Oral Every 12 hours 04/30/14 0922 05/09/14 2051   04/29/14 1800  atazanavir-cobicistat (Evotaz) 300-150 MG per tablet 1 tablet  Status:  Discontinued     1 tablet Oral Daily with breakfast 04/29/14 1408 05/02/14 1404   04/29/14 1000  cefTRIAXone (ROCEPHIN) 1 g in dextrose 5 % 50 mL IVPB - Premix  Status:  Discontinued     1 g100 mL/hr over 30 Minutes Intravenous Every 24 hours 04/29/14 0856 05/03/14 1214   04/28/14 1615  lamiVUDine (EPIVIR) tablet 150 mg  Status:  Discontinued     150 mg Oral Daily 04/28/14 1454 04/30/14 1354   04/28/14 1600  zidovudine (RETROVIR) tablet 300 mg  Status:  Discontinued     300 mg Oral 2 times daily 04/28/14 1454 04/30/14 0921      Assessment/Plan: s/p  Procedure(s): EXPLORATORY LAPAROTOMY (N/A) DUODENOTOMY with Primary ligation Bleeding Duodenal Ulcer (N/A) Clears only TNA Resp failure - per [facility], I added scheduled duonebs and will check ABG to R/O hypercarbia I spoke with his neice  LOS: 18 days    Aniela Caniglia E 05/16/2014

## 2014-05-16 NOTE — Progress Notes (Signed)
   05/16/14 1943  Vitals  Temp 99.4 F (37.4 C)  Temp Source Oral  BP 92/79 mmHg  MAP (mmHg) 892  BP Location Left Arm  BP Method Automatic  Patient Position (if appropriate) Lying  Pulse Rate (!) 118  Pulse Rate Source Monitor  ECG Heart Rate (!) 125  Cardiac Rhythm ST  Resp (!) 28  upon assessment pt respitoriy jumping as high 40's wheezing, tremors body feels warm, oral temp 99.4  Pt has flexi seal so unable to get rectal temp pt saturation 90 % on 6 liters. Pt placed on 40 % venni mask previous nurse had spoke with dr Wendee Beavers about similar symptoms pt c/o pain in abdomen prn med given appears to be approved will continue  to monitor

## 2014-05-16 NOTE — Telephone Encounter (Signed)
Error

## 2014-05-16 NOTE — Progress Notes (Signed)
[facility] PROGRESS NOTE  Gar Glance DJM:[id number] DOB: [birth year] DOA: 04/28/2014 PCP: Cloyd Stagers, MD  Assessment/Plan: Principal Problem:  GI bleed - General surgery on board. UGI evaluation completed and reportedly showing small contained leak at upper end of duodenal suture line closure. Diet recommendations per General surgery - will monitor cbc's. Monitor in ICU - Rectal tube inserted to assess better output  Active Problems:   Human immunodeficiency virus (HIV) disease - ID on board and will manage    Hypokalemia - on TPN will defer replacement to pharmacy     Recurrent pleural effusion on right - Pt is s/p VATs with pleurodesis - Malignant effusion - Recommendations were made for aggressive pulmonary management/hygeine    AKI (acute kidney injury) - Improving most likely elevated secondary to GI bleed - Will continue to monitor cbc and S creatinine    Protein-calorie malnutrition, severe - Patient currently on TPN     Acute respiratory failure with hypoxia - Chest tube removed - critical care team has signed off - Cardiothoracic team on board   Code Status: presumed full Family Communication:  None at bedside Disposition Plan: Pending improvement in condition   Consultants:  CT  General surgery  Procedures: 12/7 Outpatient thoracentesis by IR> exudative, cytology negative 12/10 abdominal ultrasound> 13 mm gallstone, mild tenderness over gallbladder without gallbladder thickening, possible left upper pole renal calculus, right pleural effusion noted 12/10 renal ultrasound> no hydronephrosis 12/11 HIDA scan> patent cystic duct without evidence of acute cholecystitis 12/11 Echo> LVEF 65%, RV normal. 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  Antibiotics:  None  HPI/Subjective: Pt  has no new complaints. No acute issues overnight reported  Objective: Filed Vitals:   05/16/14 0700  BP: 100/64  Pulse: 122  Temp: 98 F (36.7 C)  Resp: 19    Intake/Output Summary (Last 24 hours) at 05/16/14 1048 Last data filed at 05/16/14 0957  Gross per 24 hour  Intake   2170 ml  Output   2240 ml  Net    -70 ml   Filed Weights   05/03/14 2205 05/05/14 1830 05/09/14 0530  Weight: 56.836 kg (125 lb 4.8 oz) 55.5 kg (122 lb 5.7 oz) 56.9 kg (125 lb 7.1 oz)    Exam:   General:  Pt in nad, alert and awake  Cardiovascular: rrr, no mrg  Respiratory: no wheezes, decreased breath sounds at R base  Abdomen: ND, no guarding  Musculoskeletal: no cyanosis or clubbing   Data Reviewed: Basic Metabolic Panel:  Recent Labs Lab 05/10/14 1028 05/11/14 0500 05/12/14 0214 05/13/14 0621 05/14/14 0420 05/15/14 0320 05/16/14 0300  NA  --  141 145 143 145 149* 144  K  --  3.8 4.3 3.2* 3.7 3.3* 4.5  CL  --  115* 116* 114* 114* 122* 114*  CO2  --  19 23 18* 23 21 24   GLUCOSE  --  148* 94 116* 102* 107* 131*  BUN  --  38* 41* 50* 53* 48* 56*  CREATININE  --  2.31* 2.35* 2.10* 1.98* 1.49* 1.66*  CALCIUM  --  7.5* 8.1* 7.9* 8.1* 6.7* 8.0*  MG 1.6 1.7 1.9  --   --   --  2.1  PHOS 5.4* 4.1 4.2  --   --   --  5.1*   Liver Function Tests:  Recent Labs Lab 05/11/14 0500 05/12/14 0214 05/16/14 0300  AST 43* 35 52*  ALT 37 30 63*  ALKPHOS 61 76 143*  BILITOT 0.7 0.5 0.4  PROT 4.0* 4.4* 4.6*  ALBUMIN 1.7* 1.6* 1.4*   No results for input(s): LIPASE, AMYLASE in the last 168 hours. No results for input(s): AMMONIA in the last 168 hours. CBC:  Recent Labs Lab 05/14/14 1000 05/14/14 1840 05/15/14 0300 05/15/14 1500 05/15/14 2055 05/16/14 0300  WBC 7.3 9.4 9.0 13.6*  --  10.2  NEUTROABS  --   --   --   --   --  7.9*  HGB 10.6* 11.2* 9.2* 7.7* 10.2* 10.0*  HCT 31.8* 33.6* 27.7* 23.5* 31.9* 31.3*  MCV 90.1 92.1 93.3 94.4  --  94.6  PLT 230 251 218 300  --  227    Cardiac Enzymes: No results for input(s): CKTOTAL, CKMB, CKMBINDEX, TROPONINI in the last 168 hours. BNP (last 3 results)  Recent Labs  04/21/14 1131  PROBNP 25.0   CBG:  Recent Labs Lab 05/15/14 1655 05/15/14 2008 05/15/14 2324 05/16/14 0313 05/16/14 0745  GLUCAP 138* 127* 122* 137* 106*    No results found for this or any previous visit (from the past 240 hour(s)).   Studies: Dg Chest Port 1 View  05/16/2014   CLINICAL DATA:  Shortness of breath.  EXAM: PORTABLE CHEST - 1 VIEW  COMPARISON:  05/15/2014.  FINDINGS: Right IJ line in stable position. Heart size stable. Pulmonary vascularity normal. Stable right base tiny loculated pneumothorax. Stable right base atelectasis and/or infiltrate and right pleural effusion.  IMPRESSION: 1. Stable residual loculated right base pneumothorax. 2. Stable right base atelectasis and/or infiltrate with right pleural effusion. 3. Right IJ line in stable position.   Electronically Signed   By: Marcello Moores  Register   On: 05/16/2014 09:16   Dg Chest Port 1 View  05/15/2014   CLINICAL DATA:  Shortness of breath, history of lung cancer, HIV  EXAM: PORTABLE CHEST - 1 VIEW  COMPARISON:  05/13/2014  FINDINGS: Cardiomediastinal silhouette is stable. The right chest tube has been removed. Small residual loculated right basilar and right lower lateral hydro pneumothorax. Persistent consolidation in right lower lobe. Stable streaky airspace opacification in right upper lobe. Improvement in aeration left base. Left lung is clear. No pulmonary edema. Stable right IJ catheter.  IMPRESSION: The right chest tube has been removed. Small residual loculated right basilar and right lower lateral hydro pneumothorax. Persistent consolidation in right lower lobe. Stable streaky airspace opacification in right upper lobe. Improvement in aeration left base.   Electronically Signed   By: Lahoma Crocker M.D.   On: 05/15/2014 10:22   Dg Ugi W/high Density W/kub  05/14/2014    CLINICAL DATA:  Duodenal ulcer post surgical repair  EXAM: WATER SOLUBLE UPPER GI SERIES  TECHNIQUE: Single-column upper GI series was performed using water soluble contrast.  CONTRAST:  Omnipaque 300  COMPARISON:  None  FLUOROSCOPY TIME:  1 min 0 seconds  FINDINGS: Normal esophageal distention and motility without obstruction.  Stomach distends normally with normal appearance of rugal folds.  No mass, ulceration or outlet obstruction.  Small gas collection is identified adjacent first portion of the duodenum, eccentrically located cranially, does not conform to expected contour of the duodenal bulb, suspect ulcer crater.  After placing the patient in the RIGHT lateral decubitus position and then returning supine, small amount of contrast is seen within this collection indicating continued communication with the duodenal lumen.  Second and third portions the duodenum are grossly normal appearance.  Visualized jejunal loops normal appearance.  IMPRESSION: A small amount of contrast extends external to the course of the duodenum into a small contained gas collection cranial to the duodenum likely representing communication with duodenal ulcer crater ; contrast is contained however and does not extend beyond this collection into the retroperitoneum.   Electronically Signed   By: Lavonia Dana M.D.   On: 05/14/2014 18:59    Scheduled Meds: . antiseptic oral rinse  7 mL Mouth Rinse q12n4p  . chlorhexidine  15 mL Mouth Rinse BID  . insulin aspart  0-15 Units Subcutaneous 6 times per day  . pantoprazole (PROTONIX) IV  40 mg Intravenous Q12H   Continuous Infusions: . Marland KitchenTPN (CLINIMIX-E) Adult 75 mL/hr at 05/16/14 0800   And  . fat emulsion 250 mL (05/16/14 0800)  . Marland KitchenTPN (CLINIMIX-E) Adult     And  . fat emulsion       Time spent: > 35 minutes    Velvet Bathe  [facility] Pager [phone]. If 7PM-7AM, please contact night-coverage at [email or web address], password [facility] 05/16/2014, 10:48 AM  LOS: 18 days

## 2014-05-16 NOTE — Progress Notes (Signed)
PT Cancellation Note  Patient Details Name: Garv Kuechle MRN: [id number] DOB: August 23, 1945   Cancelled Treatment:    Reason Eval/Treat Not Completed: Patient declined, no reason specified.  Pt adamantly refusing any mobility at this time stating that he worked with OT this morning and that he is too tired for anything else.  Will f/u as able.     Rilen Shukla, Thornton Papas 05/16/2014, 2:41 PM

## 2014-05-16 NOTE — Progress Notes (Signed)
NUTRITION FOLLOW UP  Pt meets criteria for SEVERE MALNUTRITION in the context of chronic illness as evidenced by energy intake </= 75% for >/= 1 month and severe muscle mass loss.  DOCUMENTATION CODES Per approved criteria  -Severe malnutrition in the context of chronic illness   Intervention:    Resource Breeze po TID, each supplement provides 250 kcal and 9 grams of protein  TPN dosing per Pharmacy to meet nutrition needs as able  Nutrition Dx:   Increased nutrition needs related to chronic illness as evidenced by estimated nutrition needs. Ongoing.  Goal:   Intake to meet >90% of estimated nutrition needs. Met.  Monitor:   PO intake, labs, weight trend, TPN tolerance/adequacy.  Assessment:   Pt with past medical history of chronic renal disease with a worsening creatinine, HIV with undetectable viral load, on antiviral therapy came into the hospital several days to admission for abdominal pain he was diagnosed with gallbladder stones. CT scan of the chest showed a mass and effusion had a thoracocentesis that showed fluid, no malignancies, he relates his abdominal pain is made worse by eating.  S/P emergent exploratory laparotomy with primary ligation of bleeding duodenal ulcer and placement of JP drain on 12/21. Required intubation, was extubated on 12/22.   Patient is receiving TPN with Clinimix E 5/15 @ 75 ml/hr and lipids @ 10 ml/hr. Provides 2040 ml, 1758 kcal, and 90 grams protein per day. Meets 93% minimum estimated energy needs and 100% minimum estimated protein needs.  Patient is receiving clear liquid meal trays. Hardly eating anything per discussion with RN. He is mostly sipping on water throughout the day. Would benefit from an oral supplement to maximize oral intake.  Height: Ht Readings from Last 1 Encounters:  05/05/14 5' 6" (1.676 m)    Weight Status:   Wt Readings from Last 1 Encounters:  05/09/14 125 lb 7.1 oz (56.9 kg)   05/05/14 122 lb 5.7 oz (55.5  kg)   04/28/14 122 lb (55.339 kg)    Re-estimated needs:  Kcal: 1900-2100 Protein: 85-100 gm Fluid: 2 L  Skin: no issues  Diet Order: TPN (CLINIMIX-E) Adult Diet clear liquid TPN (CLINIMIX-E) Adult   Intake/Output Summary (Last 24 hours) at 05/16/14 1352 Last data filed at 05/16/14 0957  Gross per 24 hour  Intake   2170 ml  Output   2010 ml  Net    160 ml    Last BM: 12/27   Labs:   Recent Labs Lab 05/11/14 0500 05/12/14 0214  05/14/14 0420 05/15/14 0320 05/16/14 0300  NA 141 145  < > 145 149* 144  K 3.8 4.3  < > 3.7 3.3* 4.5  CL 115* 116*  < > 114* 122* 114*  CO2 19 23  < > _0 BUN 38* 41*  < > 53* 48* 56*  CREATININE 2.31* 2.35*  < > 1.98* 1.49* 1.66*  CALCIUM 7.5* 8.1*  < > 8.1* 6.7* 8.0*  MG 1.7 1.9  --   --   --  2.1  PHOS 4.1 4.2  --   --   --  5.1*  GLUCOSE 148* 94  < > 102* 107* 131*  < > = values in this interval not displayed.  CBG (last 3)   Recent Labs  05/15/14 2324 05/16/14 0313 05/16/14 0745  GLUCAP 122* 137* 106*    Scheduled Meds: . antiseptic oral rinse  7 mL Mouth Rinse q12n4p  . chlorhexidine  15 mL Mouth Rinse BID  .  insulin aspart  0-15 Units Subcutaneous 6 times per day  . ipratropium-albuterol  3 mL Nebulization Q4H  . pantoprazole (PROTONIX) IV  40 mg Intravenous Q12H    Continuous Infusions: . .TPN (CLINIMIX-E) Adult 75 mL/hr at 05/16/14 0800   And  . fat emulsion 250 mL (05/16/14 0800)  . .TPN (CLINIMIX-E) Adult     And  . fat emulsion      Kimberly Harris, RD, LDN, CNSC Pager [phone] After Hours Pager [phone]     

## 2014-05-16 NOTE — Progress Notes (Addendum)
[facility] DAILY ICU PROGRESS NOTE                   [address]            [place] [address]          [phone]   7 Days Post-Op Procedure(s) (LRB): EXPLORATORY LAPAROTOMY (N/A) DUODENOTOMY with Primary ligation Bleeding Duodenal Ulcer (N/A)  Total Length of Stay:  LOS: 18 days   Subjective: Congested, feels Chills, shaking, + wheezing  Objective: Vital signs in last 24 hours: Temp:  [96.9 F (36.1 C)-98.4 F (36.9 C)] 98 F (36.7 C) (12/28 0700) Pulse Rate:  [83-122] 122 (12/28 0700) Cardiac Rhythm:  [-] Sinus tachycardia (12/28 0235) Resp:  [18-47] 19 (12/28 0700) BP: (90-110)/(52-68) 100/64 mmHg (12/28 0700) SpO2:  [86 %-99 %] 94 % (12/28 0700)  Filed Weights   05/03/14 2205 05/05/14 1830 05/09/14 0530  Weight: 125 lb 4.8 oz (56.836 kg) 122 lb 5.7 oz (55.5 kg) 125 lb 7.1 oz (56.9 kg)    Weight change:    Hemodynamic parameters for last 24 hours:    Intake/Output from previous day: 12/27 0701 - 12/28 0700 In: 2000 [P.O.:120; TPN:1880] Out: 2175 [Urine:2050; Drains:125]  Intake/Output this shift:    Current Meds: Scheduled Meds: . antiseptic oral rinse  7 mL Mouth Rinse q12n4p  . chlorhexidine  15 mL Mouth Rinse BID  . insulin aspart  0-15 Units Subcutaneous 6 times per day  . pantoprazole (PROTONIX) IV  40 mg Intravenous Q12H   Continuous Infusions: . Marland KitchenTPN (CLINIMIX-E) Adult 75 mL/hr at 05/16/14 0600   And  . fat emulsion 250 mL (05/16/14 0600)  . Marland KitchenTPN (CLINIMIX-E) Adult     And  . fat emulsion     PRN Meds:.fentaNYL, levalbuterol, ondansetron (ZOFRAN) IV, sodium chloride  General appearance: alert, cooperative and mild distress Heart: regular rate and rhythm Lungs: wheezes throughout Abdomen: soft Extremities: minor edema Wound: chest incis with serosang drainage  Lab Results: CBC: Recent Labs  05/15/14 1500 05/15/14 2055 05/16/14 0300  WBC 13.6*  --  10.2  HGB 7.7* 10.2* 10.0*  HCT 23.5* 31.9* 31.3*  PLT 300  --  227    BMET:  Recent Labs  05/15/14 0320 05/16/14 0300  NA 149* 144  K 3.3* 4.5  CL 122* 114*  CO2 21 24  GLUCOSE 107* 131*  BUN 48* 56*  CREATININE 1.49* 1.66*  CALCIUM 6.7* 8.0*    PT/INR: No results for input(s): LABPROT, INR in the last 72 hours. Radiology: Dg Chest Port 1 View  05/15/2014   CLINICAL DATA:  Shortness of breath, history of lung cancer, HIV  EXAM: PORTABLE CHEST - 1 VIEW  COMPARISON:  05/13/2014  FINDINGS: Cardiomediastinal silhouette is stable. The right chest tube has been removed. Small residual loculated right basilar and right lower lateral hydro pneumothorax. Persistent consolidation in right lower lobe. Stable streaky airspace opacification in right upper lobe. Improvement in aeration left base. Left lung is clear. No pulmonary edema. Stable right IJ catheter.  IMPRESSION: The right chest tube has been removed. Small residual loculated right basilar and right lower lateral hydro pneumothorax. Persistent consolidation in right lower lobe. Stable streaky airspace opacification in right upper lobe. Improvement in aeration left base.   Electronically Signed   By: Lahoma Crocker M.D.   On: 05/15/2014 10:22   Dg Ugi W/high Density W/kub  05/14/2014   CLINICAL DATA:  Duodenal ulcer post surgical repair  EXAM: WATER SOLUBLE  UPPER GI SERIES  TECHNIQUE: Single-column upper GI series was performed using water soluble contrast.  CONTRAST:  Omnipaque 300  COMPARISON:  None  FLUOROSCOPY TIME:  1 min 0 seconds  FINDINGS: Normal esophageal distention and motility without obstruction.  Stomach distends normally with normal appearance of rugal folds.  No mass, ulceration or outlet obstruction.  Small gas collection is identified adjacent first portion of the duodenum, eccentrically located cranially, does not conform to expected contour of the duodenal bulb, suspect ulcer crater.  After placing the patient in the RIGHT lateral decubitus position and then returning supine, small amount of  contrast is seen within this collection indicating continued communication with the duodenal lumen.  Second and third portions the duodenum are grossly normal appearance.  Visualized jejunal loops normal appearance.  IMPRESSION: A small amount of contrast extends external to the course of the duodenum into a small contained gas collection cranial to the duodenum likely representing communication with duodenal ulcer crater ; contrast is contained however and does not extend beyond this collection into the retroperitoneum.   Electronically Signed   By: Lavonia Dana M.D.   On: 05/14/2014 18:59     Assessment/Plan: S/P Procedure(s) (LRB): EXPLORATORY LAPAROTOMY (N/A) DUODENOTOMY with Primary ligation Bleeding Duodenal Ulcer (N/A)  1 stable from thoracic surgery but needs cont aggressive pulmonary management/hygiene and physical rehab as able. On xopenex currently. CCM/family medicine managing    GOLD,WAYNE E 05/16/2014 8:48 AM  Chart reviewed, patient examined, agree with above.

## 2014-05-16 NOTE — Progress Notes (Signed)
PARENTERAL NUTRITION CONSULT NOTE - FOLLOW UP  Pharmacy Consult for TPN Indication: Prolonged ileus  No Known Allergies  Patient Measurements: Height: 5' 6"  (167.6 cm) Weight: 125 lb 7.1 oz (56.9 kg) IBW/kg (Calculated) : 63.8 Adjusted Body Weight:  Usual Weight:   Vital Signs: Temp: 96.9 F (36.1 C) (12/28 0235) Temp Source: Axillary (12/28 0235) BP: 98/60 mmHg (12/28 0235) Pulse Rate: 83 (12/28 0300) Intake/Output from previous day: 12/27 0701 - 12/28 0700 In: 2000 [P.O.:120; TPN:1880] Out: 2075 [Urine:1950; Drains:125] Intake/Output from this shift:    Labs:  Recent Labs  05/15/14 0300 05/15/14 1500 05/15/14 2055 05/16/14 0300  WBC 9.0 13.6*  --  10.2  HGB 9.2* 7.7* 10.2* 10.0*  HCT 27.7* 23.5* 31.9* 31.3*  PLT 218 300  --  227     Recent Labs  05/14/14 0420 05/15/14 0320 05/16/14 0300  NA 145 149* 144  K 3.7 3.3* 4.5  CL 114* 122* 114*  CO2 23 21 24   GLUCOSE 102* 107* 131*  BUN 53* 48* 56*  CREATININE 1.98* 1.49* 1.66*  CALCIUM 8.1* 6.7* 8.0*  MG  --   --  2.1  PHOS  --   --  5.1*  PROT  --   --  4.6*  ALBUMIN  --   --  1.4*  AST  --   --  52*  ALT  --   --  63*  ALKPHOS  --   --  143*  BILITOT  --   --  0.4  TRIG 217*  --  132   Estimated Creatinine Clearance: 35.2 mL/min (by C-G formula based on Cr of 1.66).    Recent Labs  05/15/14 2008 05/15/14 2324 05/16/14 0313  GLUCAP 127* 122* 137*    Medications:  Scheduled:  . antiseptic oral rinse  7 mL Mouth Rinse q12n4p  . chlorhexidine  15 mL Mouth Rinse BID  . insulin aspart  0-15 Units Subcutaneous 6 times per day  . pantoprazole (PROTONIX) IV  40 mg Intravenous Q12H    Insulin Requirements in the past 24 hours: 5 units mod SSI; 12 units regular insulin in TPN bag  Nutritional Goals:  1900-2100 kCal, 85-100 grams of protein per day  Current Nutrition:  Clinimix E 5/20 @ 75 ml/hr with 20%lipids @ 67m/hr will provide 90 gm protein and 2064 Kcal/day  Assessment: 68yo  man on TPN with serosanguinous drainage from JP, (+)abd distention with open wound.   GI: S/P duodenotomy with primary ligation of bleeding duodenal ulcer on 12/19.UGI shows small contained leak at upper end of duodenal suture line closure  (+)JP drain->clear. Prealbumin 9.7 (low at baseline -likely due to recent surgery). Abd soft.  Start clears  PPI-IV bid  Endo: No hx DM. Moderate SSI and insulin in TPN, CBG 122-138  Lytes: K 4.5, Phos 5.1, Co Ca 10.01, Ca x Phos = 51.4, Mag 2.1  Renal: Cr 1.66, UOP 1.468mkg/hr, I/O ~even  Pulm: S/P VATS/decort of empyema with talc pleurodesis.Bx of RLL shows carcinoma Needed venturi mask overnight  Cards: BP low-nl, HR ok  Hepatobil: Alk phos, AST/ALT elevated, TG 273  Neuro:A&O. Pain controlled  ID: HIV meds remain on hold. WBC wnl, AFeb  Best Practices: SCDs  TPN Access: 12/21 R IJ CVC  TPN day#: 12/22 >>  Plan:  - Continue Clinimix E 5/15 at 75 ml/hr and 20% lipids at 1028mr. This provides 100% of estimated needs. - Continue insulin in TPN (12 units/bag, pt receiving ~11 units) -am BMET,  phos  Thanks for allowing pharmacy to be a part of this patient's care.  Excell Seltzer, PharmD Clinical Pharmacist, [phone]

## 2014-05-16 NOTE — Progress Notes (Signed)
Occupational Therapy Treatment Patient Details Name: [name] [name] MRN: [id number] DOB: June 02, 1945 Today's Date: 05/16/2014    History of present illness 68 y/o male with HIV followed by Dr. [name] for an exudative effusion was admitted on 12/10 for pain in his abdomen and recurrence of the pleural effusion which had been sampled just three days prior. s/p VATS for lung biopsy 12/17. s/p EXPLORATORY LAPAROTOMY and DUODENOTOMY with Primary ligation Bleeding Duodenal Ulcer  12/21.   OT comments  Pt progressing. Pt ambulated to sink but took a couple breaks due to fatigue. Educated on deep breathing technique in session. Performed exercises with theraband.  Follow Up Recommendations  CIR    Equipment Recommendations       Recommendations for Other Services      Precautions / Restrictions Precautions Precaution Comments: multiple lines Restrictions Weight Bearing Restrictions: No       Mobility Bed Mobility Overal bed mobility: Needs Assistance Bed Mobility: Supine to Sit;Sit to Supine     Supine to sit: Supervision Sit to supine: Supervision   General bed mobility comments: assist to scoot HOB and cues for technique to do so.  Transfers Overall transfer level: Needs assistance Equipment used: Rolling walker (2 wheeled) Transfers: Sit to/from Stand Sit to Stand: Min guard              Balance                                   ADL Overall ADL's : Needs assistance/impaired     Grooming: Brushing hair;Sitting;Set up;Supervision/safety       Lower Body Bathing: Min guard;Sit to/from stand (washed buttocks)           Toilet Transfer: Min guard;Ambulation;RW;BSC           Functional mobility during ADLs: Min guard;Rolling walker   ADL comments: Educated on energy conservation techniques. Ambulated to sink and performed some ADLs. Took breaks in session.     Vision                     Perception     Praxis      Cognition   Awake/Alert Behavior During Therapy: WFL for tasks assessed/performed Overall Cognitive Status: Within Functional Limits for tasks assessed                       Extremity/Trunk Assessment               Exercises Other Exercises Other Exercises: approximately 10 reps each of shoulder flexion with both UE's. Approximately 10 eps of shoulder abduction with both UE's and approximately 10 reps of horizontal abduction with bilateral UE's. Approximately 20 reps of elbow extension with both UE's and approximately 10 reps of elbow flexion with both UE's. Tried using orange leve 2 theraband, but feel level 1 is better for pt and instructed him to use the light colored one.   Shoulder Instructions       General Comments      Pertinent Vitals/ Pain       Pain Assessment: No/denies pain; O2 dropping in 80's on 4.5 L of O2. Educated on deep breathing technique and pt performed. Increased O2 to 5L and notified nurse-O2 fluctuating in 80's-90's on 5L but was in 90's at end of session on 5L.    Home Living  Prior Functioning/Environment              Frequency Min 2X/week     Progress Toward Goals  OT Goals(current goals can now be found in the care plan section)  Progress towards OT goals: Progressing toward goals  Acute Rehab OT Goals Patient Stated Goal: not stated OT Goal Formulation: With patient Time For Goal Achievement: 05/28/14 Potential to Achieve Goals: Good ADL Goals Pt Will Perform Grooming: with supervision;standing Pt Will Perform Upper Body Bathing: with set-up;sitting Pt Will Perform Lower Body Bathing: with supervision;sit to/from stand Pt Will Perform Upper Body Dressing: with set-up;sitting Pt Will Perform Lower Body Dressing: with supervision;sit to/from stand Pt Will Transfer to Toilet: with supervision;ambulating;regular height toilet;bedside commode;grab bars Pt Will Perform Toileting -  Clothing Manipulation and hygiene: with supervision;sit to/from stand Pt/caregiver will Perform Home Exercise Program: Increased strength;With theraband;Both right and left upper extremity;With written HEP provided;Independently Additional ADL Goal #1: Pt will be able to participate in 25 mins therapeutic activity with no more than 2 rest breaks to increase endurance as needed for BASDLs  Plan Discharge plan remains appropriate    Co-evaluation                 End of Session Equipment Utilized During Treatment: Gait belt;Rolling walker;Oxygen   Activity Tolerance Patient limited by fatigue   Patient Left in bed;with call bell/phone within reach   Nurse Communication Mobility status;Other (comment) (O2 sats; bumped up O2; level of theraband)        Time: 1001-1034 OT Time Calculation (min): 33 min  Charges: OT General Charges $OT Visit: 1 Procedure OT Treatments $Self Care/Home Management : 8-22 mins $Therapeutic Exercise: 8-22 mins  Benito Mccreedy OTR/L [phone] 05/16/2014, 2:01 PM

## 2014-05-16 NOTE — Progress Notes (Signed)
Dr. Wendee Beavers called regarding change in patient condition. Pt's mentation has decreased, he is now only oriented to person and place. His heart rate and respiratory rater continue to be elevated. Pt has also began to have tremors, Pt states "I'm so cold". Temp re- checked and remains WDL. Orders to obtain blood culture if pt spikes temp. Will continue to monitor   05/16/14 1650  Vitals  Temp 98.2 F (36.8 C)  Temp Source Oral  BP 125/61 mmHg  MAP (mmHg) 73  BP Location Right Arm  BP Method Automatic  Patient Position (if appropriate) Lying  Pulse Rate (!) 117  ECG Heart Rate (!) 111  Resp (!) 21  Oxygen Therapy  SpO2 94 %  O2 Device Nasal Cannula  O2 Flow Rate (L/min) 5 L/min  Pre-WUA / WUA Start  [place] Agitation Sedation Scale (RASS) 0  RASS Goal 0  .

## 2014-05-17 ENCOUNTER — Inpatient Hospital Stay (HOSPITAL_COMMUNITY): Payer: Commercial Managed Care - HMO

## 2014-05-17 ENCOUNTER — Encounter (HOSPITAL_COMMUNITY): Payer: Self-pay | Admitting: Radiology

## 2014-05-17 DIAGNOSIS — R1010 Upper abdominal pain, unspecified: Secondary | ICD-10-CM

## 2014-05-17 DIAGNOSIS — A419 Sepsis, unspecified organism: Secondary | ICD-10-CM

## 2014-05-17 DIAGNOSIS — R6510 Systemic inflammatory response syndrome (SIRS) of non-infectious origin without acute organ dysfunction: Secondary | ICD-10-CM

## 2014-05-17 DIAGNOSIS — R109 Unspecified abdominal pain: Secondary | ICD-10-CM | POA: Insufficient documentation

## 2014-05-17 DIAGNOSIS — C3490 Malignant neoplasm of unspecified part of unspecified bronchus or lung: Secondary | ICD-10-CM

## 2014-05-17 LAB — GLUCOSE, CAPILLARY
GLUCOSE-CAPILLARY: 114 mg/dL — AB (ref 70–99)
GLUCOSE-CAPILLARY: 114 mg/dL — AB (ref 70–99)
GLUCOSE-CAPILLARY: 127 mg/dL — AB (ref 70–99)
GLUCOSE-CAPILLARY: 135 mg/dL — AB (ref 70–99)
GLUCOSE-CAPILLARY: 157 mg/dL — AB (ref 70–99)
Glucose-Capillary: 108 mg/dL — ABNORMAL HIGH (ref 70–99)
Glucose-Capillary: 113 mg/dL — ABNORMAL HIGH (ref 70–99)

## 2014-05-17 LAB — BLOOD GAS, ARTERIAL
Acid-base deficit: 3.8 mmol/L — ABNORMAL HIGH (ref 0.0–2.0)
Bicarbonate: 19.6 mEq/L — ABNORMAL LOW (ref 20.0–24.0)
Drawn by: 277331
O2 CONTENT: 5 L/min
O2 SAT: 91.3 %
PATIENT TEMPERATURE: 92
TCO2: 20.5 mmol/L (ref 0–100)
pCO2 arterial: 24.2 mmHg — ABNORMAL LOW (ref 35.0–45.0)
pH, Arterial: 7.501 — ABNORMAL HIGH (ref 7.350–7.450)
pO2, Arterial: 47.4 mmHg — ABNORMAL LOW (ref 80.0–100.0)

## 2014-05-17 LAB — BASIC METABOLIC PANEL WITH GFR
Anion gap: 6 (ref 5–15)
BUN: 47 mg/dL — ABNORMAL HIGH (ref 6–23)
CO2: 19 mmol/L (ref 19–32)
Calcium: 6 mg/dL — CL (ref 8.4–10.5)
Chloride: 118 meq/L — ABNORMAL HIGH (ref 96–112)
Creatinine, Ser: 1.4 mg/dL — ABNORMAL HIGH (ref 0.50–1.35)
GFR calc Af Amer: 59 mL/min — ABNORMAL LOW
GFR calc non Af Amer: 51 mL/min — ABNORMAL LOW
Glucose, Bld: 94 mg/dL (ref 70–99)
Potassium: 3.4 mmol/L — ABNORMAL LOW (ref 3.5–5.1)
Sodium: 143 mmol/L (ref 135–145)

## 2014-05-17 LAB — MAGNESIUM: Magnesium: 2 mg/dL (ref 1.5–2.5)

## 2014-05-17 LAB — PHOSPHORUS
PHOSPHORUS: 4.9 mg/dL — AB (ref 2.3–4.6)
Phosphorus: 3.9 mg/dL (ref 2.3–4.6)

## 2014-05-17 LAB — LACTIC ACID, PLASMA
Lactic Acid, Venous: 2.6 mmol/L — ABNORMAL HIGH (ref 0.5–2.2)
Lactic Acid, Venous: 1.6 mmol/L (ref 0.5–2.2)

## 2014-05-17 MED ORDER — FAT EMULSION 20 % IV EMUL
250.0000 mL | INTRAVENOUS | Status: AC
  Administered 2014-05-17: 250 mL via INTRAVENOUS
  Filled 2014-05-17: qty 250

## 2014-05-17 MED ORDER — VANCOMYCIN HCL 500 MG IV SOLR
500.0000 mg | Freq: Two times a day (BID) | INTRAVENOUS | Status: DC
  Administered 2014-05-18: 500 mg via INTRAVENOUS
  Filled 2014-05-17 (×3): qty 500

## 2014-05-17 MED ORDER — LEVALBUTEROL HCL 1.25 MG/0.5ML IN NEBU
1.2500 mg | INHALATION_SOLUTION | Freq: Four times a day (QID) | RESPIRATORY_TRACT | Status: DC | PRN
  Administered 2014-05-20: 1.25 mg via RESPIRATORY_TRACT
  Filled 2014-05-17 (×3): qty 0.5

## 2014-05-17 MED ORDER — SODIUM CHLORIDE 0.9 % IV BOLUS (SEPSIS)
2000.0000 mL | Freq: Once | INTRAVENOUS | Status: AC
  Administered 2014-05-17: 2000 mL via INTRAVENOUS

## 2014-05-17 MED ORDER — SODIUM CHLORIDE 0.9 % IV SOLN
100.0000 mg | Freq: Every day | INTRAVENOUS | Status: DC
  Administered 2014-05-17 – 2014-05-18 (×2): 100 mg via INTRAVENOUS
  Filled 2014-05-17 (×3): qty 100

## 2014-05-17 MED ORDER — TRACE MINERALS CR-CU-F-FE-I-MN-MO-SE-ZN IV SOLN
INTRAVENOUS | Status: AC
  Administered 2014-05-17: 18:00:00 via INTRAVENOUS
  Filled 2014-05-17: qty 2000

## 2014-05-17 MED ORDER — ACETAMINOPHEN 650 MG RE SUPP: 325.0000 mg | RECTAL | Status: DC | PRN

## 2014-05-17 MED ORDER — PIPERACILLIN-TAZOBACTAM 3.375 G IVPB
3.3750 g | Freq: Three times a day (TID) | INTRAVENOUS | Status: DC
  Administered 2014-05-17 – 2014-05-19 (×7): 3.375 g via INTRAVENOUS
  Filled 2014-05-17 (×9): qty 50

## 2014-05-17 MED ORDER — IOHEXOL 300 MG/ML  SOLN
100.0000 mL | Freq: Once | INTRAMUSCULAR | Status: AC | PRN
  Administered 2014-05-17: 100 mL via INTRAVENOUS

## 2014-05-17 MED ORDER — CALCIUM GLUCONATE 10 % IV SOLN
1.0000 g | Freq: Once | INTRAVENOUS | Status: AC
  Administered 2014-05-17: 1 g via INTRAVENOUS
  Filled 2014-05-17: qty 10

## 2014-05-17 MED ORDER — VANCOMYCIN HCL IN DEXTROSE 1-5 GM/200ML-% IV SOLN
1000.0000 mg | Freq: Once | INTRAVENOUS | Status: AC
  Administered 2014-05-17: 1000 mg via INTRAVENOUS
  Filled 2014-05-17: qty 200

## 2014-05-17 MED ORDER — LEVALBUTEROL HCL 1.25 MG/0.5ML IN NEBU
1.2500 mg | INHALATION_SOLUTION | Freq: Four times a day (QID) | RESPIRATORY_TRACT | Status: DC
  Administered 2014-05-17 – 2014-05-23 (×21): 1.25 mg via RESPIRATORY_TRACT
  Filled 2014-05-17 (×30): qty 0.5

## 2014-05-17 MED ORDER — IOHEXOL 300 MG/ML  SOLN
25.0000 mL | INTRAMUSCULAR | Status: AC
  Administered 2014-05-17 (×2): 25 mL via ORAL

## 2014-05-17 MED ORDER — SODIUM CHLORIDE 0.9 % IV SOLN: 100.0000 mg | Freq: Every day | INTRAVENOUS | Status: DC

## 2014-05-17 MED ORDER — POTASSIUM CHLORIDE 10 MEQ/50ML IV SOLN
10.0000 meq | INTRAVENOUS | Status: AC
  Administered 2014-05-17 (×3): 10 meq via INTRAVENOUS
  Filled 2014-05-17 (×3): qty 50

## 2014-05-17 NOTE — Progress Notes (Signed)
PARENTERAL NUTRITION CONSULT NOTE - FOLLOW UP  Pharmacy Consult for TPN Indication: Prolonged ileus  No Known Allergies  Patient Measurements: Height: 5' 6" (167.6 cm) Weight: 125 lb 7.1 oz (56.9 kg) IBW/kg (Calculated) : 63.8 Adjusted Body Weight:  Usual Weight:   Vital Signs: Temp: 98.8 F (37.1 C) (12/29 0324) Temp Source: Oral (12/29 0324) BP: 98/58 mmHg (12/29 0324) Pulse Rate: 114 (12/29 0324) Intake/Output from previous day: 12/28 0701 - 12/29 0700 In: 2135 [P.O.:180; TPN:1955] Out: 1565 [Urine:1525; Drains:40] Intake/Output from this shift:    Labs:  Recent Labs  05/15/14 0300 05/15/14 1500 05/15/14 2055 05/16/14 0300  WBC 9.0 13.6*  --  10.2  HGB 9.2* 7.7* 10.2* 10.0*  HCT 27.7* 23.5* 31.9* 31.3*  PLT 218 300  --  227     Recent Labs  05/15/14 0320 05/16/14 0300 05/17/14 0425  NA 149* 144 143  K 3.3* 4.5 3.4*  CL 122* 114* 118*  CO2 21 24 19  GLUCOSE 107* 131* 94  BUN 48* 56* 47*  CREATININE 1.49* 1.66* 1.40*  CALCIUM 6.7* 8.0* 6.0*  MG  --  2.1  --   PHOS  --  5.1* 3.9  PROT  --  4.6*  --   ALBUMIN  --  1.4*  --   AST  --  52*  --   ALT  --  63*  --   ALKPHOS  --  143*  --   BILITOT  --  0.4  --   PREALBUMIN  --  9.7*  --   TRIG  --  132  --    Estimated Creatinine Clearance: 41.8 mL/min (by C-G formula based on Cr of 1.4).    Recent Labs  05/16/14 2021 05/17/14 0020 05/17/14 0443  GLUCAP 121* 127* 135*    Medications:  Scheduled:  . antiseptic oral rinse  7 mL Mouth Rinse q12n4p  . chlorhexidine  15 mL Mouth Rinse BID  . feeding supplement (RESOURCE BREEZE)  1 Container Oral TID BM  . insulin aspart  0-15 Units Subcutaneous 6 times per day  . levalbuterol  1.25 mg Nebulization Q6H  . pantoprazole (PROTONIX) IV  40 mg Intravenous Q12H  . potassium chloride  10 mEq Intravenous Q1 Hr x 3    Insulin Requirements in the past 24 hours: 2 units mod SSI; 12 units regular insulin in TPN bag  Nutritional Goals:  1900-2100  kCal, 85-100 grams of protein per day  Current Nutrition:  Clinimix E 5/15 @ 75 ml/hr with 20%lipids @ 10ml/hr will provide 90 gm protein and 1758 kcal/day Resource Breeze ordered tid  Assessment: 68 yo man on TPN with serosanguinous drainage from JP, (+)abd distention with open wound.   GI: S/P duodenotomy with primary ligation of bleeding duodenal ulcer on 12/19.UGI shows small contained leak at upper end of duodenal suture line closure  (+)JP drain->clear. Prealbumin 9.7 (low at baseline -likely due to recent surgery). Abd soft.  Start clears  PPI-IV bid  Bilious rectal drainiage  Endo: No hx DM. Moderate SSI and insulin in TPN, CBG <150  Lytes: K 3.4, Phos 3.9, Ca 6, Alb 1.4, CoCa 8.08  Renal: Cr 1.4 , UOP 1.2ml/kg/hr, I/O (+) 500 ml  Acute urinary retention requiring foley  Pulm: S/P VATS/decort of empyema with talc pleurodesis.Bx of RLL shows carcinoma Needed venturi mask overnight, RR 35-44 on 6L02  Cards: BP low-nl, HR increased  Hepatobil: Alk phos, AST/ALT elevated, TG 273  Neuro:A&O. Pain controlled  ID: HIV   meds remain on hold. WBC wnl, AFeb  Best Practices: SCDs  TPN Access: 12/21 R IJ CVC  TPN day#: 12/22 >>  Plan:  - Continue Clinimix E 5/15 at 75 ml/hr and 20% lipids at 10ml/hr. This provides 90% of estimated needs. - Continue insulin in TPN (12 units/bag, pt receiving ~11 units) -KCl IV 10 mEq IV x 3 -f/u diet advancement -f/u am labs  Thanks for allowing pharmacy to be a part of this patient's care.  Lora Seay, PharmD Clinical Pharmacist, [phone]     

## 2014-05-17 NOTE — Progress Notes (Signed)
ANTIBIOTIC CONSULT NOTE - INITIAL  Pharmacy Consult:  Vancomycin / Zosyn Indication:  Sepsis  No Known Allergies  Patient Measurements: Height: 5\' 6"  (167.6 cm) Weight: 125 lb 7.1 oz (56.9 kg) IBW/kg (Calculated) : 63.8  Vital Signs: Temp: 98.2 F (36.8 C) (12/29 0818) Temp Source: Oral (12/29 0818) BP: 112/51 mmHg (12/29 0818) Pulse Rate: 108 (12/29 0818) Intake/Output from previous day: 12/28 0701 - 12/29 0700 In: 2135 [P.O.:180; TPN:1953] Out: 1565 [Urine:1525; Drains:40] Intake/Output from this shift: Total I/O In: 290 [P.O.:240; IV Piggyback:50] Out: 800 [Urine:800]  Labs:  Recent Labs  05/15/14 0300 05/15/14 0320 05/15/14 1500 05/15/14 2055 05/16/14 0300 05/17/14 0425  WBC 9.0  --  13.6*  --  10.2  --   HGB 9.2*  --  7.7* 10.2* 10.0*  --   PLT 218  --  300  --  227  --   CREATININE  --  1.49*  --   --  1.66* 1.40*   Estimated Creatinine Clearance: 41.8 mL/min (by C-G formula based on Cr of 1.4). No results for input(s): VANCOTROUGH, VANCOPEAK, VANCORANDOM, GENTTROUGH, GENTPEAK, GENTRANDOM, TOBRATROUGH, TOBRAPEAK, TOBRARND, AMIKACINPEAK, AMIKACINTROU, AMIKACIN in the last 72 hours.   Microbiology: Recent Results (from the past 720 hour(s))  Body fluid culture     Status: None   Collection Time: 04/25/14 11:57 AM  Result Value Ref Range Status   Specimen Description FLUID RIGHT PLEURAL  Final   Special Requests NONE  Final   Gram Stain   Final    RARE WBC PRESENT, PREDOMINANTLY PMN NO ORGANISMS SEEN Performed at [facility]    Culture   Final    NO GROWTH 3 DAYS Performed at [facility]    Report Status 04/28/2014 FINAL  Final  Surgical pcr screen     Status: None   Collection Time: 05/04/14  9:12 PM  Result Value Ref Range Status   MRSA, PCR NEGATIVE NEGATIVE Final   Staphylococcus aureus NEGATIVE NEGATIVE Final    Comment:        The Xpert SA Assay (FDA approved for NASAL specimens in patients over 66 years of age), is one  component of a comprehensive surveillance program.  Test performance has been validated by [employer] for patients greater than or equal to 66 year old. It is not intended to diagnose infection nor to guide or monitor treatment.   Anaerobic culture     Status: None   Collection Time: 05/05/14  3:08 PM  Result Value Ref Range Status   Specimen Description FLUID PLEURAL RIGHT  Final   Special Requests NONE  Final   Gram Stain   Final    NO WBC SEEN NO ORGANISMS SEEN Performed at [facility]    Culture   Final    NO ANAEROBES ISOLATED Performed at [facility]    Report Status 05/10/2014 FINAL  Final  Body fluid culture     Status: None   Collection Time: 05/05/14  3:08 PM  Result Value Ref Range Status   Specimen Description FLUID PLEURAL RIGHT  Final   Special Requests NONE  Final   Gram Stain   Final    NO WBC SEEN NO ORGANISMS SEEN Performed at [facility]    Culture   Final    NO GROWTH 3 DAYS Performed at [facility]    Report Status 05/08/2014 FINAL  Final    Medical History: Past Medical History  Diagnosis Date  . HIV (human  immunodeficiency virus infection) dx'd 2010  . High cholesterol   . Bleeding per rectum   . Depression   . Chronic renal disease   . Peripheral neuropathy   . Pleural effusion on right      Assessment: 68 YOM with history of HIV admitted on 04/28/14 with abdominal pain and pleural effusion.  Patient is s/p VATS on 05/05/14 from which he was placed on Zosyn for 3 days as empiric coverage.  His HIV medications were held on 05/09/14 due to prolonged ileus limiting their absorption.  Now to start vancomycin and resume Zosyn for sepsis.  His renal function is improving; good UOP.  +HIV - on Epivir, Retrovir and Evotaz. Evotaz changed to Dar/Cobi to avoid PPI interaction >> all held 12/21  Zosyn 12/17 >> 12/20, resumed 12/29 >> Vanc 12/29 >>  12/17 pleural fluid cx - negative 12/17 anaerobic  pleural fluid cx - negative 12/17 MRSA PCR - negative 12/29 BCx x2 - 12/29 TA cx -   Goal of Therapy:  Vancomycin trough level 15-20 mcg/ml   Plan:  - Vanc 1gm IV x 1, then 500mg  IV Q12H - Zosyn 3.375gm IV Q8H, 4 hr infusion - Monitor renal fxn, clinical progress, vanc trough as indicated - F/U resume HIV regimen when possible    Dalayna Lauter D. Mina Marble, PharmD, BCPS Pager:  [phone] 05/17/2014, 10:57 AM

## 2014-05-17 NOTE — Progress Notes (Signed)
Paged K Sckorr with previous notes information she ordered 500 cc bolus will continue to monitor

## 2014-05-17 NOTE — Progress Notes (Signed)
Condom cath off, voiding small amounts. Complaints of pain upon urination. Last void was 250cc's, PVR ordered and recorded 536 cc's in bladder.  Foley cath ordered and placed using hospital protocol, received 700 cc's cloudy yellow urine  Immediately.  Patient tolerated without difficulty. No complaints.

## 2014-05-17 NOTE — Progress Notes (Signed)
Paged Kscorr  With critical Calcium of 6.0, no new orders given, also renterated that pt respiration go from 20 to 40. Pt's lungs mor rhonchus this am  Gave prn breathing treatment , NT'S patient per md orders mucous thick yellow to tenacious to get much out. She said would let rounding MD know

## 2014-05-17 NOTE — Progress Notes (Signed)
[facility] Male[facility] Progress Note 8 Days Post-Op  Subjective: Patient not doing well.  Sitting up in chair, huffing and puffing significantly.  RR between 35-44.  Oxygen saturations  92-94% on 6L.  In acute urinary retention with bladder scan of > 500cc after 250 cc void.  Significant bilious rectal drainage with Flexiseal in place.  Objective: Vital signs in last 24 hours: Temp:  [98.2 F (36.8 C)-99.4 F (37.4 C)] 98.8 F (37.1 C) (12/29 0324) Pulse Rate:  [88-118] 114 (12/29 0324) Resp:  [17-30] 29 (12/29 0324) BP: (92-125)/(52-79) 98/58 mmHg (12/29 0324) SpO2:  [92 %-97 %] 95 % (12/29 0346) FiO2 (%):  [45 %] 45 % (12/28 2330) Last BM Date: 05/15/14  Intake/Output from previous day: 12/28 0701 - 12/29 0700 In: 2135 [P.O.:180; TPN:1953] Out: 1565 [Urine:1525; Drains:40] Intake/Output this shift:    General: Mild distress.  Breathing heavily.  Had no premorbid pulmonary issues according to the patient.  Lungs: Bilateral wheezing, RR 40, CXR shows bilateral interstitial infiltrates with large right pulmonary effusion.  ?ARDS versus diffuse pneumonia akin to fungal PNA.  No respiratory cultures sent.  Abd: Benign clinically.  30cc from drain the last shift.  Slightly bilious.  Not taking much PO.  Extremities: No changes  Neuro: Intact  Lab Results:  @LABLAST2 (wbc:2,hgb:2,hct:2,plt:2) BMET  Recent Labs  05/16/14 0300 05/17/14 0425  NA 144 143  K 4.5 3.4*  CL 114* 118*  CO2 24 19  GLUCOSE 131* 94  BUN 56* 47*  CREATININE 1.66* 1.40*  CALCIUM 8.0* 6.0*   PT/INR No results for input(s): LABPROT, INR in the last 72 hours. ABG  Recent Labs  05/16/14 1210  PHART 7.402  HCO3 22.0    Studies/Results: Dg Chest Port 1 View  05/16/2014   CLINICAL DATA:  Shortness of breath.  EXAM: PORTABLE CHEST - 1 VIEW  COMPARISON:  05/15/2014.  FINDINGS: Right IJ line in stable position. Heart size stable. Pulmonary vascularity normal. Stable right base tiny loculated pneumothorax.  Stable right base atelectasis and/or infiltrate and right pleural effusion.  IMPRESSION: 1. Stable residual loculated right base pneumothorax. 2. Stable right base atelectasis and/or infiltrate with right pleural effusion. 3. Right IJ line in stable position.   Electronically Signed   By: Marcello Moores  Register   On: 05/16/2014 09:16    Anti-infectives: Anti-infectives    Start     Dose/Rate Route Frequency Ordered Stop   05/09/14 1815  cefoTEtan (CEFOTAN) 1 g in dextrose 5 % 50 mL IVPB     1 g100 mL/hr over 30 Minutes Intravenous  Once 05/09/14 1805 05/09/14 1720   05/05/14 1900  piperacillin-tazobactam (ZOSYN) IVPB 3.375 g     3.375 g12.5 mL/hr over 240 Minutes Intravenous Every 8 hours 05/05/14 1533 05/08/14 1520   05/05/14 0600  cefUROXime (ZINACEF) 1.5 g in dextrose 5 % 50 mL IVPB     1.5 g100 mL/hr over 30 Minutes Intravenous On call to O.R. 05/04/14 1106 05/05/14 1345   05/03/14 0800  darunavir-cobicistat (PREZCOBIX) 800-150 MG per tablet 1 tablet  Status:  Discontinued     1 tablet Oral Daily with breakfast 05/02/14 1404 05/09/14 2051   05/01/14 1000  lamiVUDine (EPIVIR) 10 MG/ML solution 100 mg  Status:  Discontinued     100 mg Oral Daily 04/30/14 1354 05/09/14 2051   04/30/14 1000  zidovudine (RETROVIR) capsule 300 mg  Status:  Discontinued     300 mg Oral Every 12 hours 04/30/14 0922 05/09/14 2051   04/29/14 1800  atazanavir-cobicistat (Evotaz)  300-150 MG per tablet 1 tablet  Status:  Discontinued     1 tablet Oral Daily with breakfast 04/29/14 1408 05/02/14 1404   04/29/14 1000  cefTRIAXone (ROCEPHIN) 1 g in dextrose 5 % 50 mL IVPB - Premix  Status:  Discontinued     1 g100 mL/hr over 30 Minutes Intravenous Every 24 hours 04/29/14 0856 05/03/14 1214   04/28/14 1615  lamiVUDine (EPIVIR) tablet 150 mg  Status:  Discontinued     150 mg Oral Daily 04/28/14 1454 04/30/14 1354   04/28/14 1600  zidovudine (RETROVIR) tablet 300 mg  Status:  Discontinued     300 mg Oral 2 times daily 04/28/14  1454 04/30/14 0921      Assessment/Plan: s/p Procedure(s): EXPLORATORY LAPAROTOMY DUODENOTOMY with Primary ligation Bleeding Duodenal Ulcer Because of tachycardia change Duoneb to Xopenex.  Recheck ABG Insert Foley for acute retentions  LOS: 19 days   Kathryne Eriksson. Dahlia Bailiff, MD, FACS [phone] [facility] Surgery 05/17/2014

## 2014-05-17 NOTE — Progress Notes (Signed)
[facility] PROGRESS NOTE  Owen Pratte WER:[id number] DOB: [birth year] DOA: 04/28/2014 PCP: Cloyd Stagers, MD   Brief Narrative: Patient is a 68 year old with history of HIV who presented to the hospital complaining of right upper quadrant abdominal pain and was found to have a large right pleural effusion with right lower lobe mass. Cardiothoracic surgeon consulted and patient is status post VATS with pleurodesis. 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 During his hospital stay patient was also found to have duodenal ulcer which Gen. surgery was consulted and patient is status post exploratory laparotomy with Duodenotomy with primary ligation.    Assessment/Plan: Principal Problem:  GI bleed - General surgery on board. Pt is s/p exploratory laparotomy with duodenotomy and primary ligation for bleeding duodenal ulcer.   UGI evaluation completed and reportedly showing small contained leak at upper end of duodenal suture line closure. Diet recommendations per General surgery - will monitor cbc's. Monitor in ICU - Rectal tube inserted to assess better output  SIRs - New problem, will obtain blood cultures. We'll place on broad spectrum antibiotics Zosyn and vancomycin. Patient has multiple possibilities for sources of infection which include his lung and his abdomen.  - Patient with tachypnea, tachycardia, chest tube was recently discontinued. CT surgeon on board.  Hypocalcemia - Will replace IV 05/17/14  Active Problems:   Human immunodeficiency virus (HIV) disease - Will discuss case with infectious disease specialist and consult for further evaluation and recommendations.    Hypokalemia - on TPN will defer replacement to pharmacy     Recurrent pleural effusion on right  - Pt is s/p VATs with pleurodesis - Malignant effusion - CT on board - Recommendations were made for aggressive pulmonary  management/hygeine - Consult critical care team given his worsening tachypnea    AKI (acute kidney injury) - Improving most likely elevated secondary to GI bleed - Will continue to monitor cbc and S creatinine    Protein-calorie malnutrition, severe - Patient currently on TPN    Acute respiratory failure with hypoxia - Chest tube removed - critical care team has been reconsulted. - Cardiothoracic team on board   Code Status: presumed full Family Communication:  None at bedside Disposition Plan: Pending improvement in condition   Consultants:  CT  General surgery  Procedures: 12/7 Outpatient thoracentesis by IR> exudative, cytology negative 12/10 abdominal ultrasound> 13 mm gallstone, mild tenderness over gallbladder without gallbladder thickening, possible left upper pole renal calculus, right pleural effusion noted 12/10 renal ultrasound> no hydronephrosis 12/11 HIDA scan> patent cystic duct without evidence of acute cholecystitis 12/11 Echo> LVEF 65%, RV normal. 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  Antibiotics:  None  HPI/Subjective: Pt has chills  Objective: Filed Vitals:   05/17/14 0818  BP: 112/51  Pulse: 108  Temp: 98.2 F (36.8 C)  Resp: 33    Intake/Output Summary (Last 24 hours) at 05/17/14 1041 Last data filed at 05/17/14 0916  Gross per 24 hour  Intake   2170 ml  Output   2140 ml  Net     30 ml   Filed Weights   05/03/14 2205 05/05/14 1830 05/09/14 0530  Weight: 56.836 kg (125 lb 4.8 oz) 55.5 kg (122 lb 5.7 oz) 56.9 kg (125 lb 7.1 oz)    Exam:   General:  Pt in nad, alert and awake, shivers  Cardiovascular:  rrr, no mrg  Respiratory: increased work of breathing, decreased breath sounds of r lung base  Abdomen: ND, no guarding  Musculoskeletal: no cyanosis or clubbing   Data  Reviewed: Basic Metabolic Panel:  Recent Labs Lab 05/11/14 0500 05/12/14 0214 05/13/14 0621 05/14/14 0420 05/15/14 0320 05/16/14 0300 05/17/14 0425  NA 141 145 143 145 149* 144 143  K 3.8 4.3 3.2* 3.7 3.3* 4.5 3.4*  CL 115* 116* 114* 114* 122* 114* 118*  CO2 19 23 18* 23 21 24 19   GLUCOSE 148* 94 116* 102* 107* 131* 94  BUN 38* 41* 50* 53* 48* 56* 47*  CREATININE 2.31* 2.35* 2.10* 1.98* 1.49* 1.66* 1.40*  CALCIUM 7.5* 8.1* 7.9* 8.1* 6.7* 8.0* 6.0*  MG 1.7 1.9  --   --   --  2.1  --   PHOS 4.1 4.2  --   --   --  5.1* 3.9   Liver Function Tests:  Recent Labs Lab 05/11/14 0500 05/12/14 0214 05/16/14 0300  AST 43* 35 52*  ALT 37 30 63*  ALKPHOS 61 76 143*  BILITOT 0.7 0.5 0.4  PROT 4.0* 4.4* 4.6*  ALBUMIN 1.7* 1.6* 1.4*   No results for input(s): LIPASE, AMYLASE in the last 168 hours. No results for input(s): AMMONIA in the last 168 hours. CBC:  Recent Labs Lab 05/14/14 1000 05/14/14 1840 05/15/14 0300 05/15/14 1500 05/15/14 2055 05/16/14 0300  WBC 7.3 9.4 9.0 13.6*  --  10.2  NEUTROABS  --   --   --   --   --  7.9*  HGB 10.6* 11.2* 9.2* 7.7* 10.2* 10.0*  HCT 31.8* 33.6* 27.7* 23.5* 31.9* 31.3*  MCV 90.1 92.1 93.3 94.4  --  94.6  PLT 230 251 218 300  --  227   Cardiac Enzymes: No results for input(s): CKTOTAL, CKMB, CKMBINDEX, TROPONINI in the last 168 hours. BNP (last 3 results)  Recent Labs  04/21/14 1131  PROBNP 25.0   CBG:  Recent Labs Lab 05/16/14 1642 05/16/14 2021 05/17/14 0020 05/17/14 0443 05/17/14 0827  GLUCAP 104* 121* 127* 135* 108*    No results found for this or any previous visit (from the past 240 hour(s)).   Studies: Dg Chest Port 1 View  05/17/2014   CLINICAL DATA:  Difficulty breathing  EXAM: PORTABLE CHEST - 1 VIEW  COMPARISON:  May 16, 2014  FINDINGS: Central catheter tip is in the superior vena cava near the cavoatrial junction, stable. A small loculated pneumothorax in the right base remains stable. No new or  expanding pneumothorax. Consolidation with loculated effusion on the right persists. There is generalized interstitial prominence,, largely due to underlying interstitial fibrosis. There may be some superimposed interstitial edema. Heart size and pulmonary vascularity are normal. No adenopathy.  IMPRESSION: Persistent loculated pneumothorax right base. Right sided pleural effusion, at least partly loculated. Suspect underlying interstitial fibrosis, although there may be a degree of superimposed interstitial edema. There is consolidation right base, stable. No change in cardiac silhouette. No new opacity.   Electronically Signed   By: Lowella Grip M.D.   On: 05/17/2014 08:11   Dg Chest Port 1 View  05/16/2014   CLINICAL DATA:  Shortness of breath.  EXAM: PORTABLE CHEST - 1 VIEW  COMPARISON:  05/15/2014.  FINDINGS: Right IJ line in stable position. Heart size stable. Pulmonary vascularity normal. Stable right base tiny loculated pneumothorax. Stable right base atelectasis and/or infiltrate and right pleural effusion.  IMPRESSION: 1. Stable residual loculated right base pneumothorax.  2. Stable right base atelectasis and/or infiltrate with right pleural effusion. 3. Right IJ line in stable position.   Electronically Signed   By: Marcello Moores  Register   On: 05/16/2014 09:16    Scheduled Meds: . antiseptic oral rinse  7 mL Mouth Rinse q12n4p  . chlorhexidine  15 mL Mouth Rinse BID  . feeding supplement (RESOURCE BREEZE)  1 Container Oral TID BM  . insulin aspart  0-15 Units Subcutaneous 6 times per day  . levalbuterol  1.25 mg Nebulization Q6H  . pantoprazole (PROTONIX) IV  40 mg Intravenous Q12H  . potassium chloride  10 mEq Intravenous Q1 Hr x 3   Continuous Infusions: . Marland KitchenTPN (CLINIMIX-E) Adult 75 mL/hr at 05/17/14 0600   And  . fat emulsion 250 mL (05/17/14 0600)  . Marland KitchenTPN (CLINIMIX-E) Adult     And  . fat emulsion       Time spent: > 35 minutes    Velvet Bathe  [facility] Pager  [phone]. If 7PM-7AM, please contact night-coverage at [email or web address], password [facility] 05/17/2014, 10:41 AM  LOS: 19 days

## 2014-05-17 NOTE — Consult Note (Signed)
[facility]  Total days of antibiotics 8        Day 1 vanco        Day 1 piptazo               Reason for Consult: SIRS    Referring Physician: vega  Principal Problem:   Acute kidney injury Active Problems:   Human immunodeficiency virus (HIV) disease   Renal failure   Hyperkalemia   Recurrent pleural effusion on right   Macrocytic anemia   Gallbladder stone without cholecystitis or obstruction   AKI (acute kidney injury)   Protein-calorie malnutrition, severe   SOB (shortness of breath)   Shock   Acute respiratory failure with hypoxia   Upper GI bleed    HPI: Azazel Franze is a 68 y.o. male with well controlled HIV disease, depression, CKD, Cd 4 count of 270/VL<20 Sept 2015. he was admitted on 12/10 for abdominal pain, however a few days prior was being evaluated for pulmonary mass and exudative effusion. 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 He underwent VATS with talc pleuradesis on 12/17. His course was further complicated with upper GI bleed requiring multiple units of RBCs due to duodenal ulcer bleed. He underwent  Emergent laparotomy, duodenotomy with ligation of bleeding duodenal ulcer and JP drain placement on 12/21. On 12/22, he was weaned off vasopressors and extubated. On 12/29, He appeared to have tachypnea  concerning for new onset infection/HAI. This morning, found to have urinary retention and tachypnea with 92% sats on 6LNC. Abd drain having only 63m output overnight. He was pan-cultured and started on vancomycin and piptazo. His HIV regimen of DRV/cobi plus lamivudine & zidovudine was last given on 12/21 just prior to GI surgery. Earlier in his course he was treated with 7 days of ceftriaxone for possible CAP. CXR not significantly worse than prior study. Patient also being seen by critical care team.  Past Medical History  Diagnosis Date  . HIV (human immunodeficiency virus  infection) dx'd 2010  . High cholesterol   . Bleeding per rectum   . Depression   . Chronic renal disease   . Peripheral neuropathy   . Pleural effusion on right     Allergies: No Known Allergies   MEDICATIONS: . antiseptic oral rinse  7 mL Mouth Rinse q12n4p  . chlorhexidine  15 mL Mouth Rinse BID  . feeding supplement (RESOURCE BREEZE)  1 Container Oral TID BM  . insulin aspart  0-15 Units Subcutaneous 6 times per day  . iohexol  25 mL Oral Q1 Hr x 2  . levalbuterol  1.25 mg Nebulization Q6H  . pantoprazole (PROTONIX) IV  40 mg Intravenous Q12H  . piperacillin-tazobactam (ZOSYN)  IV  3.375 g Intravenous Q8H  . sodium chloride  2,000 mL Intravenous Once  . [START ON 05/18/2014] vancomycin  500 mg Intravenous Q12H    History  Substance Use Topics  . Smoking status: Former Smoker -- 1.00 packs/day for 46 years    Types: Cigarettes    Quit date: 04/24/2014  . Smokeless tobacco: Never Used  . Alcohol Use: 0.5 oz/week    1 Not specified per week     Comment: 04/28/2014 "might drink a little but only on holidays or vacations"    Family History  Problem Relation Age of Onset  . Heart failure Mother   . Pneumonia Father      Review of Systems  Constitutional: positive  for chills. Negative for fever, activity change, appetite change, fatigue and unexpected weight change.  HENT: Negative for congestion, sore throat, rhinorrhea, sneezing, trouble swallowing and sinus pressure.  Eyes: Negative for photophobia and visual disturbance.  Respiratory: Negative for cough, chest tightness, shortness of breath, wheezing and stridor.  Cardiovascular: positive for some shortness of breath Negative for chest pain, palpitations and leg swelling.  Gastrointestinal: Negative for nausea, vomiting, abdominal pain, diarrhea, constipation, blood in stool, abdominal distention and anal bleeding.  Genitourinary: Negative for dysuria, hematuria, flank pain and difficulty urinating.    Musculoskeletal: Negative for myalgias, back pain, joint swelling, arthralgias and gait problem.  Skin: Negative for color change, pallor, rash and wound.  Neurological: Negative for dizziness, tremors, weakness and light-headedness.  Hematological: Negative for adenopathy. Does not bruise/bleed easily.  Psychiatric/Behavioral: Negative for behavioral problems, confusion, sleep disturbance, dysphoric mood, decreased concentration and agitation.     OBJECTIVE: Temp:  [98.2 F (36.8 C)-99.4 F (37.4 C)] 99.3 F (37.4 C) (12/29 1155) Pulse Rate:  [88-120] 108 (12/29 0818) Resp:  [17-35] 33 (12/29 0818) BP: (92-125)/(51-79) 108/51 mmHg (12/29 1155) SpO2:  [92 %-97 %] 94 % (12/29 0818) FiO2 (%):  [45 %] 45 % (12/28 2330) Physical Exam  Constitutional: He is oriented to person, place, and time. He appears undernourished, underweight. In NAD, but wearing nasal cannula with tachypnea HENT: right IJ with dressing coming down Mouth/Throat: Oropharynx is clear and moist. No oropharyngeal exudate. Dry oral pharynx Cardiovascular: Normal rate, regular rhythm and normal heart sounds. Exam reveals no gallop and no friction rub.  No murmur heard.  Pulmonary/Chest: course breath sounds R>L. No acc muscle use or tachypnea. No respiratory distress. He has no wheezes.  Abdominal: Soft. Bowel sounds are normal. He exhibits no distension. There is no tenderness. RLQ drain in place. Open abdominal wound, clean wound site with Abdominal bandage from surgery Neurological: He is alert and oriented to person, place, and time.  Skin: Skin is warm and dry. No rash noted. No erythema.  Psychiatric: He has a normal mood and affect. His behavior is normal.    LABS: Results for orders placed or performed during the hospital encounter of 04/28/14 (from the past 48 hour(s))  CBC     Status: Abnormal   Collection Time: 05/15/14  3:00 PM  Result Value Ref Range   WBC 13.6 (H) 4.0 - 10.5 K/uL    Comment: WHITE  COUNT CONFIRMED ON SMEAR   RBC 2.49 (L) 4.22 - 5.81 MIL/uL   Hemoglobin 7.7 (L) 13.0 - 17.0 g/dL   HCT 23.5 (L) 39.0 - 52.0 %   MCV 94.4 78.0 - 100.0 fL   MCH 30.9 26.0 - 34.0 pg   MCHC 32.8 30.0 - 36.0 g/dL   RDW 16.9 (H) 11.5 - 15.5 %   Platelets 300 150 - 400 K/uL    Comment: PLATELET COUNT CONFIRMED BY SMEAR  Glucose, capillary     Status: Abnormal   Collection Time: 05/15/14  4:55 PM  Result Value Ref Range   Glucose-Capillary 138 (H) 70 - 99 mg/dL   Comment 1 Notify RN    Comment 2 Documented in Chart   Glucose, capillary     Status: Abnormal   Collection Time: 05/15/14  8:08 PM  Result Value Ref Range   Glucose-Capillary 127 (H) 70 - 99 mg/dL  Hemoglobin and hematocrit, blood     Status: Abnormal   Collection Time: 05/15/14  8:55 PM  Result Value Ref Range   Hemoglobin  10.2 (L) 13.0 - 17.0 g/dL    Comment: REPEATED TO VERIFY   HCT 31.9 (L) 39.0 - 52.0 %  Glucose, capillary     Status: Abnormal   Collection Time: 05/15/14 11:24 PM  Result Value Ref Range   Glucose-Capillary 122 (H) 70 - 99 mg/dL  Comprehensive metabolic panel     Status: Abnormal   Collection Time: 05/16/14  3:00 AM  Result Value Ref Range   Sodium 144 135 - 145 mmol/L    Comment: Please note change in reference range.   Potassium 4.5 3.5 - 5.1 mmol/L    Comment: Please note change in reference range. DELTA CHECK NOTED    Chloride 114 (H) 96 - 112 mEq/L   CO2 24 19 - 32 mmol/L   Glucose, Bld 131 (H) 70 - 99 mg/dL   BUN 56 (H) 6 - 23 mg/dL   Creatinine, Ser 1.66 (H) 0.50 - 1.35 mg/dL   Calcium 8.0 (L) 8.4 - 10.5 mg/dL   Total Protein 4.6 (L) 6.0 - 8.3 g/dL   Albumin 1.4 (L) 3.5 - 5.2 g/dL   AST 52 (H) 0 - 37 U/L   ALT 63 (H) 0 - 53 U/L   Alkaline Phosphatase 143 (H) 39 - 117 U/L   Total Bilirubin 0.4 0.3 - 1.2 mg/dL   GFR calc non Af Amer 41 (L) >90 mL/min   GFR calc Af Amer 48 (L) >90 mL/min    Comment: (NOTE) The eGFR has been calculated using the CKD EPI equation. This calculation has  not been validated in all clinical situations. eGFR's persistently <90 mL/min signify possible Chronic Kidney Disease.    Anion gap 6 5 - 15  Magnesium     Status: None   Collection Time: 05/16/14  3:00 AM  Result Value Ref Range   Magnesium 2.1 1.5 - 2.5 mg/dL  Phosphorus     Status: Abnormal   Collection Time: 05/16/14  3:00 AM  Result Value Ref Range   Phosphorus 5.1 (H) 2.3 - 4.6 mg/dL  CBC     Status: Abnormal   Collection Time: 05/16/14  3:00 AM  Result Value Ref Range   WBC 10.2 4.0 - 10.5 K/uL   RBC 3.31 (L) 4.22 - 5.81 MIL/uL   Hemoglobin 10.0 (L) 13.0 - 17.0 g/dL   HCT 31.3 (L) 39.0 - 52.0 %   MCV 94.6 78.0 - 100.0 fL   MCH 30.2 26.0 - 34.0 pg   MCHC 31.9 30.0 - 36.0 g/dL   RDW 18.0 (H) 11.5 - 15.5 %   Platelets 227 150 - 400 K/uL    Comment: REPEATED TO VERIFY  Differential     Status: Abnormal   Collection Time: 05/16/14  3:00 AM  Result Value Ref Range   Neutrophils Relative % 77 43 - 77 %   Neutro Abs 7.9 (H) 1.7 - 7.7 K/uL   Lymphocytes Relative 7 (L) 12 - 46 %   Lymphs Abs 0.7 0.7 - 4.0 K/uL   Monocytes Relative 14 (H) 3 - 12 %   Monocytes Absolute 1.5 (H) 0.1 - 1.0 K/uL   Eosinophils Relative 2 0 - 5 %   Eosinophils Absolute 0.2 0.0 - 0.7 K/uL   Basophils Relative 0 0 - 1 %   Basophils Absolute 0.0 0.0 - 0.1 K/uL  Prealbumin     Status: Abnormal   Collection Time: 05/16/14  3:00 AM  Result Value Ref Range   Prealbumin 9.7 (L) 17.0 - 34.0 mg/dL  Comment: Performed at [facility]  Triglycerides     Status: None   Collection Time: 05/16/14  3:00 AM  Result Value Ref Range   Triglycerides 132 <150 mg/dL  Glucose, capillary     Status: Abnormal   Collection Time: 05/16/14  3:13 AM  Result Value Ref Range   Glucose-Capillary 137 (H) 70 - 99 mg/dL  Glucose, capillary     Status: Abnormal   Collection Time: 05/16/14  7:45 AM  Result Value Ref Range   Glucose-Capillary 106 (H) 70 - 99 mg/dL  Blood gas, arterial     Status: Abnormal    Collection Time: 05/16/14 12:10 PM  Result Value Ref Range   O2 Content 4.0 L/min   Delivery systems NASAL CANNULA    pH, Arterial 7.402 7.350 - 7.450   pCO2 arterial 35.9 35.0 - 45.0 mmHg   pO2, Arterial 64.2 (L) 80.0 - 100.0 mmHg   Bicarbonate 22.0 20.0 - 24.0 mEq/L   TCO2 23.1 0 - 100 mmol/L   Acid-base deficit 2.1 (H) 0.0 - 2.0 mmol/L   O2 Saturation 92.3 %   Patient temperature 98.0    Collection site LEFT RADIAL    Drawn by [phone]    Sample type ARTERIAL DRAW    Allens test (pass/fail) PASS PASS  Glucose, capillary     Status: Abnormal   Collection Time: 05/16/14 12:51 PM  Result Value Ref Range   Glucose-Capillary 113 (H) 70 - 99 mg/dL   Comment 1 Documented in Chart    Comment 2 Notify RN   Glucose, capillary     Status: Abnormal   Collection Time: 05/16/14  4:42 PM  Result Value Ref Range   Glucose-Capillary 104 (H) 70 - 99 mg/dL   Comment 1 Notify RN    Comment 2 Documented in Chart   Glucose, capillary     Status: Abnormal   Collection Time: 05/16/14  8:21 PM  Result Value Ref Range   Glucose-Capillary 121 (H) 70 - 99 mg/dL  Glucose, capillary     Status: Abnormal   Collection Time: 05/17/14 12:20 AM  Result Value Ref Range   Glucose-Capillary 127 (H) 70 - 99 mg/dL  Basic metabolic panel     Status: Abnormal   Collection Time: 05/17/14  4:25 AM  Result Value Ref Range   Sodium 143 135 - 145 mmol/L    Comment: Please note change in reference range.   Potassium 3.4 (L) 3.5 - 5.1 mmol/L    Comment: Please note change in reference range. DELTA CHECK NOTED    Chloride 118 (H) 96 - 112 mEq/L   CO2 19 19 - 32 mmol/L   Glucose, Bld 94 70 - 99 mg/dL   BUN 47 (H) 6 - 23 mg/dL   Creatinine, Ser 1.40 (H) 0.50 - 1.35 mg/dL   Calcium 6.0 (LL) 8.4 - 10.5 mg/dL    Comment: REPEATED TO VERIFY CRITICAL RESULT CALLED TO, READ BACK BY AND VERIFIED WITH: IRBY T,RN 05/17/14 0515 WAYK    GFR calc non Af Amer 51 (L) >90 mL/min   GFR calc Af Amer 59 (L) >90 mL/min     Comment: (NOTE) The eGFR has been calculated using the CKD EPI equation. This calculation has not been validated in all clinical situations. eGFR's persistently <90 mL/min signify possible Chronic Kidney Disease.    Anion gap 6 5 - 15  Phosphorus     Status: None   Collection Time: 05/17/14  4:25 AM  Result Value  Ref Range   Phosphorus 3.9 2.3 - 4.6 mg/dL  Glucose, capillary     Status: Abnormal   Collection Time: 05/17/14  4:43 AM  Result Value Ref Range   Glucose-Capillary 135 (H) 70 - 99 mg/dL  Blood gas, arterial     Status: Abnormal   Collection Time: 05/17/14  8:00 AM  Result Value Ref Range   O2 Content 5.0 L/min   Delivery systems NASAL CANNULA    pH, Arterial 7.501 (H) 7.350 - 7.450   pCO2 arterial 24.2 (L) 35.0 - 45.0 mmHg   pO2, Arterial 47.4 (L) 80.0 - 100.0 mmHg   Bicarbonate 19.6 (L) 20.0 - 24.0 mEq/L   TCO2 20.5 0 - 100 mmol/L   Acid-base deficit 3.8 (H) 0.0 - 2.0 mmol/L   O2 Saturation 91.3 %   Patient temperature 92.0    Collection site LEFT RADIAL    Drawn by [id number]    Sample type ARTERIAL DRAW    Allens test (pass/fail) PASS PASS  Glucose, capillary     Status: Abnormal   Collection Time: 05/17/14  8:27 AM  Result Value Ref Range   Glucose-Capillary 108 (H) 70 - 99 mg/dL  Magnesium     Status: None   Collection Time: 05/17/14 11:50 AM  Result Value Ref Range   Magnesium 2.0 1.5 - 2.5 mg/dL  Phosphorus     Status: Abnormal   Collection Time: 05/17/14 11:50 AM  Result Value Ref Range   Phosphorus 4.9 (H) 2.3 - 4.6 mg/dL  Lactic acid, plasma     Status: Abnormal   Collection Time: 05/17/14 11:50 AM  Result Value Ref Range   Lactic Acid, Venous 2.6 (H) 0.5 - 2.2 mmol/L    MICRO: 12/29 blood cx pending 12/17 pleural fluid NGTD 12/7 pleural fluid NGTD  IMAGING: Dg Chest Port 1 View  05/17/2014   CLINICAL DATA:  Difficulty breathing  EXAM: PORTABLE CHEST - 1 VIEW  COMPARISON:  May 16, 2014  FINDINGS: Central catheter tip is in the superior  vena cava near the cavoatrial junction, stable. A small loculated pneumothorax in the right base remains stable. No new or expanding pneumothorax. Consolidation with loculated effusion on the right persists. There is generalized interstitial prominence,, largely due to underlying interstitial fibrosis. There may be some superimposed interstitial edema. Heart size and pulmonary vascularity are normal. No adenopathy.  IMPRESSION: Persistent loculated pneumothorax right base. Right sided pleural effusion, at least partly loculated. Suspect underlying interstitial fibrosis, although there may be a degree of superimposed interstitial edema. There is consolidation right base, stable. No change in cardiac silhouette. No new opacity.   Electronically Signed   By: Lowella Grip M.D.   On: 05/17/2014 08:11   Dg Chest Port 1 View  05/16/2014   CLINICAL DATA:  Shortness of breath.  EXAM: PORTABLE CHEST - 1 VIEW  COMPARISON:  05/15/2014.  FINDINGS: Right IJ line in stable position. Heart size stable. Pulmonary vascularity normal. Stable right base tiny loculated pneumothorax. Stable right base atelectasis and/or infiltrate and right pleural effusion.  IMPRESSION: 1. Stable residual loculated right base pneumothorax. 2. Stable right base atelectasis and/or infiltrate with right pleural effusion. 3. Right IJ line in stable position.   Electronically Signed   By: Marcello Moores  Register   On: 05/16/2014 09:16    Assessment/Plan:  58YB M with well controlled HIV disease, recently diagnosed with poorly differentiated lung cancer, hospitalization complicated by duodenal bleed requiring ex lap, duodenotomy, and primary ligation of bleeding duodenal ulcer. Now  receiving TPN for nutrition. ART on hold until he can take food by mouth. Concern for SIRS   -agree with empiric regimen of vancomycin plus piptazo - recommend to get repeat cbc with diff in addition to other labs - if he clinically worsens, can add antifungals, for now ok  to wait - agree with critical care team to get abd and chest CT. He had small leak at duodunal sutures on prior study. abd ct would make sense to look for any intra-ab processes. Difficult to assess if having HCAP. His lung failed to re-expand after VATS, will see if chest CT has better detailed description of loculated fluid to decide if need thoracentesis. - once he reliably takes food by mouth, then would restart ART  Estephan Gallardo [facility] [phone]

## 2014-05-17 NOTE — Progress Notes (Addendum)
      [address]       [name] [address]             [phone]       8 Days Post-Op Procedure(s) (LRB): EXPLORATORY LAPAROTOMY (N/A) DUODENOTOMY with Primary ligation Bleeding Duodenal Ulcer (N/A)  Subjective: Patient shaking and with tachypnea this am.   Objective: Vital signs in last 24 hours: Temp:  [98.2 F (36.8 C)-99.4 F (37.4 C)] 98.2 F (36.8 C) (12/29 0818) Pulse Rate:  [88-120] 108 (12/29 0818) Cardiac Rhythm:  [-] Sinus tachycardia (12/29 0022) Resp:  [17-35] 33 (12/29 0818) BP: (92-125)/(51-79) 112/51 mmHg (12/29 0818) SpO2:  [92 %-97 %] 94 % (12/29 0818) FiO2 (%):  [45 %] 45 % (12/28 2330)    Intake/Output from previous day: 12/28 0701 - 12/29 0700 In: 2135 [P.O.:180; TPN:1955] Out: 1565 [Urine:1525; Drains:40]   Physical Exam:  Cardiovascular: Tachycardic Pulmonary: Tachypnea.Bilateral wheezing. Abdomen: Soft, non tender, bowel sounds present. Wounds: Clean and dry.  No erythema or signs of infection.   Lab Results: CBC: Recent Labs  05/15/14 1500 05/15/14 2055 05/16/14 0300  WBC 13.6*  --  10.2  HGB 7.7* 10.2* 10.0*  HCT 23.5* 31.9* 31.3*  PLT 300  --  227   BMET:  Recent Labs  05/16/14 0300 05/17/14 0425  NA 144 143  K 4.5 3.4*  CL 114* 118*  CO2 24 19  GLUCOSE 131* 94  BUN 56* 47*  CREATININE 1.66* 1.40*  CALCIUM 8.0* 6.0*    PT/INR: No results for input(s): LABPROT, INR in the last 72 hours. ABG:  INR: Will add last result for INR, ABG once components are confirmed Will add last 4 CBG results once components are confirmed  Assessment/Plan:  1. CV - ST in the low 100's. 2.  Pulmonary - on 6 liters of oxygen via [place] with oxygen saturation 95%. CXR shows persistent, loculated right pneumothorax/effusion. Will discuss with surgeon as may be ARDS or he may developing PNA.  3. Hypokalemia-supplement per medicine 4. Creatinine down form 1.66 to 1.4 5. Urinary retention-foley to be inserted 6.GI-On TPN.  Per general surgery 7. Management per medicine  ZIMMERMAN,DONIELLE MPA-C 05/17/2014,8:38 AM  Chart reviewed, patient examined, agree with above. His CXR looks about the same as it has previously. The right lung did not reexpand after surgery and is trapped by the lung mass and fibrosis. Therefore the residual pleural space has filled with fluid again and there is nothing to do about that. The interstitial changes in the right lung could be fibrosis, edema, pneumonia or spread of his poorly differentiated cancer. There is nothing else to do from a thoracic surgery standpoint.

## 2014-05-17 NOTE — Progress Notes (Signed)
PULMONARY / CRITICAL CARE MEDICINE   Name: [name] [name] MRN: [id number] DOB: 1946-01-04    ADMISSION DATE:  04/28/2014 CONSULTATION DATE:  04/28/2014  REFERRING MD :  Nils Pyle  CHIEF COMPLAINT:  S/P VATS  INITIAL PRESENTATION:  68 y/o male with HIV followed by Dr. [name] for an exudative effusion was admitted on 12/10 for pain in his abdomen and recurrence of the pleural effusion which had been sampled just three days prior.    SIGNIFICANT EVENTS: 12/17 VATS 12/19 no distress. Still has airleak, but think that this is possibly d/t ant CT being out of place 12/21 Recurrent GI bleed with shock. Transfusion of multiple units of RBCs. GI re-eval. CCS consult. 12/21 Emergent laparotomy: ligation of bleeding duodenal ulcer. Returned to ICU intubated and on vasopressors 12/22 Weaned off vasopressors. Extubated 12/29 New onset fevers/SIRS criteria. PCCM re-consulted.   STUDIES:  12/7 Outpatient thoracentesis by IR> exudative, cytology negative 12/10 abdominal ultrasound> 13 mm gallstone, mild tenderness over gallbladder without gallbladder thickening, possible left upper pole renal calculus, right pleural effusion noted 12/10 renal ultrasound> no hydronephrosis 12/11 HIDA scan> patent cystic duct without evidence of acute cholecystitis 12/11 Echo> LVEF 65%, RV normal. 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 JP drain placement  INTERVAL:  No complaints, however new onset fevers with SIRS criteria. Increasing infiltrate on CXR. PCCM asked to see.   VITAL SIGNS: Temp:  [98.2 F (36.8 C)-99.4 F (37.4 C)] 99.3 F (37.4 C) (12/29 1155) Pulse Rate:  [88-120] 108 (12/29 0818) Resp:  [17-35] 33 (12/29 0818) BP: (92-125)/(51-79) 108/51 mmHg (12/29 1155) SpO2:  [92  %-97 %] 94 % (12/29 0818) FiO2 (%):  [45 %] 45 % (12/28 2330) INTAKE / OUTPUT:  Intake/Output Summary (Last 24 hours) at 05/17/14 1305 Last data filed at 05/17/14 1212  Gross per 24 hour  Intake   2215 ml  Output   2140 ml  Net     75 ml    PHYSICAL EXAMINATION: General: Thin male in no acute distress Neuro: Alert, oriented, nonfocal HEENT:  [place]\AT, PERRL, no JVD noted Cardiovascular: regular Lungs: Respirations even and unlabored, coarse breath sounds R>L Abdomen: Open abdominal wound site clean Ext: no edema   LABS: CBC Recent Labs     05/15/14  0300  05/15/14  1500  05/15/14  2055  05/16/14  0300  WBC  9.0  13.6*   --   10.2  HGB  9.2*  7.7*  10.2*  10.0*  HCT  27.7*  23.5*  31.9*  31.3*  PLT  218  300   --   227    Coag's No results for input(s): APTT, INR in the last 72 hours.  BMET Recent Labs     05/15/14  0320  05/16/14  0300  05/17/14  0425  NA  149*  144  143  K  3.3*  4.5  3.4*  CL  122*  114*  118*  CO2  21  24  19   BUN  48*  56*  47*  CREATININE  1.49*  1.66*  1.40*  GLUCOSE  107*  131*  94    Electrolytes Recent Labs     05/15/14  0320  05/16/14  0300  05/17/14  0425  CALCIUM  6.7*  8.0*  6.0*  MG   --  2.1   --   PHOS   --   5.1*  3.9   ABG Recent Labs     05/16/14  1210  05/17/14  0800  PHART  7.402  7.501*  PCO2ART  35.9  24.2*  PO2ART  64.2*  47.4*    Liver Enzymes Recent Labs     05/16/14  0300  AST  52*  ALT  63*  ALKPHOS  143*  BILITOT  0.4  ALBUMIN  1.4*   Glucose Recent Labs     05/16/14  1251  05/16/14  1642  05/16/14  2021  05/17/14  0020  05/17/14  0443  05/17/14  0827  GLUCAP  113*  104*  121*  127*  135*  108*    Imaging Dg Chest Port 1 View  05/17/2014   CLINICAL DATA:  Difficulty breathing  EXAM: PORTABLE CHEST - 1 VIEW  COMPARISON:  May 16, 2014  FINDINGS: Central catheter tip is in the superior vena cava near the cavoatrial junction, stable. A small loculated pneumothorax in the  right base remains stable. No new or expanding pneumothorax. Consolidation with loculated effusion on the right persists. There is generalized interstitial prominence,, largely due to underlying interstitial fibrosis. There may be some superimposed interstitial edema. Heart size and pulmonary vascularity are normal. No adenopathy.  IMPRESSION: Persistent loculated pneumothorax right base. Right sided pleural effusion, at least partly loculated. Suspect underlying interstitial fibrosis, although there may be a degree of superimposed interstitial edema. There is consolidation right base, stable. No change in cardiac silhouette. No new opacity.   Electronically Signed   By: Lowella Grip M.D.   On: 05/17/2014 08:11   Dg Chest Port 1 View  05/16/2014   CLINICAL DATA:  Shortness of breath.  EXAM: PORTABLE CHEST - 1 VIEW  COMPARISON:  05/15/2014.  FINDINGS: Right IJ line in stable position. Heart size stable. Pulmonary vascularity normal. Stable right base tiny loculated pneumothorax. Stable right base atelectasis and/or infiltrate and right pleural effusion.  IMPRESSION: 1. Stable residual loculated right base pneumothorax. 2. Stable right base atelectasis and/or infiltrate with right pleural effusion. 3. Right IJ line in stable position.   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 Acute respiratory failure after surgery >> resolved 12/23. -F/u  CXR -Bronchial hygiene -Oxygen to keep SpO2 > 92%  AKI 2nd to GI bleed (impoved) Stage 3 CKD. - Monitor renal fx, urine outpt, electrolytes  Bleeding duodenal ulcer s/p laparotomy 12/21. -Post-op care per [facility]CS -TNA for nutrition >> advance diet per [facility]CS  Acute blood loss anemia 2nd to GI bleed (improved) -F/u CBC -SCD for DVT prevention   Georgann Housekeeper, AGACNP-BC  Pulmonology/Critical Care Pager [phone] or [phone]    STAFF NOTE: Linwood Dibbles, MD FACP have personally reviewed patient's available data, including medical history, events of note, physical examination and test results as part of my evaluation. I have discussed with resident/NP and other care providers such as pharmacist, RN and RRT.  In addition, I personally evaluated patient and elicited key findings of: New SIRS, fever, elevated RR, concern source?, lactic now noted, will bolus 30 cc/kg, CT chest re evaluate prior loculation as now new source since CT removal, also to CT abdo /pelvis for abscess, add empiric antifungals, continued ABX   Lavon Paganini. Titus Mould, MD, [place] Pgr: [facility] 05/17/2014 2:24 PM

## 2014-05-18 DIAGNOSIS — C3490 Malignant neoplasm of unspecified part of unspecified bronchus or lung: Secondary | ICD-10-CM

## 2014-05-18 DIAGNOSIS — C3431 Malignant neoplasm of lower lobe, right bronchus or lung: Principal | ICD-10-CM

## 2014-05-18 LAB — EXPECTORATED SPUTUM ASSESSMENT W REFEX TO RESP CULTURE

## 2014-05-18 LAB — URINE MICROSCOPIC-ADD ON

## 2014-05-18 LAB — BASIC METABOLIC PANEL
ANION GAP: 6 (ref 5–15)
BUN: 54 mg/dL — ABNORMAL HIGH (ref 6–23)
CALCIUM: 7.5 mg/dL — AB (ref 8.4–10.5)
CO2: 25 mmol/L (ref 19–32)
Chloride: 109 mEq/L (ref 96–112)
Creatinine, Ser: 2.04 mg/dL — ABNORMAL HIGH (ref 0.50–1.35)
GFR, EST AFRICAN AMERICAN: 37 mL/min — AB (ref >90)
GFR, EST NON AFRICAN AMERICAN: 32 mL/min — AB (ref >90)
GLUCOSE: 140 mg/dL — AB (ref 70–99)
POTASSIUM: 3.8 mmol/L (ref 3.5–5.1)
SODIUM: 140 mmol/L (ref 135–145)

## 2014-05-18 LAB — CBC WITH DIFFERENTIAL/PLATELET
BASOS ABS: 0 10*3/uL (ref 0.0–0.1)
Basophils Relative: 0 % (ref 0–1)
EOS ABS: 0 10*3/uL (ref 0.0–0.7)
Eosinophils Relative: 0 % (ref 0–5)
HCT: 28.2 % — ABNORMAL LOW (ref 39.0–52.0)
Hemoglobin: 9 g/dL — ABNORMAL LOW (ref 13.0–17.0)
Lymphocytes Relative: 7 % — ABNORMAL LOW (ref 12–46)
Lymphs Abs: 0.8 10*3/uL (ref 0.7–4.0)
MCH: 30.1 pg (ref 26.0–34.0)
MCHC: 31.9 g/dL (ref 30.0–36.0)
MCV: 94.3 fL (ref 78.0–100.0)
Monocytes Absolute: 1.1 10*3/uL — ABNORMAL HIGH (ref 0.1–1.0)
Monocytes Relative: 9 % (ref 3–12)
Neutro Abs: 9.9 10*3/uL — ABNORMAL HIGH (ref 1.7–7.7)
Neutrophils Relative %: 84 % — ABNORMAL HIGH (ref 43–77)
PLATELETS: 222 10*3/uL (ref 150–400)
RBC: 2.99 MIL/uL — AB (ref 4.22–5.81)
RDW: 18 % — AB (ref 11.5–15.5)
WBC: 11.8 10*3/uL — ABNORMAL HIGH (ref 4.0–10.5)

## 2014-05-18 LAB — EXPECTORATED SPUTUM ASSESSMENT W GRAM STAIN, RFLX TO RESP C: Special Requests: NORMAL

## 2014-05-18 LAB — PROCALCITONIN: Procalcitonin: 1.06 ng/mL

## 2014-05-18 LAB — URINALYSIS, ROUTINE W REFLEX MICROSCOPIC
Bilirubin Urine: NEGATIVE
GLUCOSE, UA: NEGATIVE mg/dL
Ketones, ur: NEGATIVE mg/dL
NITRITE: NEGATIVE
PROTEIN: NEGATIVE mg/dL
Specific Gravity, Urine: 1.021 (ref 1.005–1.030)
Urobilinogen, UA: 0.2 mg/dL (ref 0.0–1.0)
pH: 6 (ref 5.0–8.0)

## 2014-05-18 LAB — GLUCOSE, CAPILLARY
GLUCOSE-CAPILLARY: 146 mg/dL — AB (ref 70–99)
GLUCOSE-CAPILLARY: 122 mg/dL — AB (ref 70–99)
GLUCOSE-CAPILLARY: 119 mg/dL — AB (ref 70–99)
Glucose-Capillary: 135 mg/dL — ABNORMAL HIGH (ref 70–99)

## 2014-05-18 MED ORDER — TRACE MINERALS CR-CU-F-FE-I-MN-MO-SE-ZN IV SOLN
INTRAVENOUS | Status: DC
  Filled 2014-05-18: qty 2000

## 2014-05-18 MED ORDER — VANCOMYCIN HCL 500 MG IV SOLR
500.0000 mg | INTRAVENOUS | Status: DC
  Administered 2014-05-19: 500 mg via INTRAVENOUS
  Filled 2014-05-18 (×3): qty 500

## 2014-05-18 MED ORDER — FUROSEMIDE 10 MG/ML IJ SOLN
INTRAMUSCULAR | Status: AC
  Filled 2014-05-18: qty 8

## 2014-05-18 MED ORDER — FUROSEMIDE 10 MG/ML IJ SOLN
60.0000 mg | Freq: Once | INTRAMUSCULAR | Status: AC
  Administered 2014-05-18: 60 mg via INTRAVENOUS

## 2014-05-18 MED ORDER — FAT EMULSION 20 % IV EMUL
250.0000 mL | INTRAVENOUS | Status: DC
  Filled 2014-05-18: qty 250

## 2014-05-18 MED ORDER — FUROSEMIDE 10 MG/ML IJ SOLN
40.0000 mg | Freq: Once | INTRAMUSCULAR | Status: AC
  Administered 2014-05-18: 40 mg via INTRAVENOUS
  Filled 2014-05-18: qty 4

## 2014-05-18 NOTE — Progress Notes (Signed)
Pt noted to have increased work of breathing, desatted to 79%, on 6L [place]. Placed on Venti mask 50%. Respiratory rate 30s-40s. Coarse crackles auscultated. Paged Lamar Blinks, NP, received orders for 40 mg Lasix IV. Will administer and continue to monitor.  Sherlie Ban, RN

## 2014-05-18 NOTE — Progress Notes (Signed)
[facility] PROGRESS NOTE Interim History: 68 year old with history of HIV who presented to the hospital complaining of right upper quadrant abdominal pain and was found to have a large right pleural effusion with right lower lobe mass. Cardiothoracic surgeon consulted and patient is status post VATS with pleurodesis. 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 During his hospital stay patient was also found to have duodenal ulcer which Gen. surgery was consulted and patient is status post exploratory laparotomy with Duodenotomy with primary ligation   Assessment/Plan:  Upper GI bleed/ hemorrhagic shock: - Status post emergent laparotomy on 05/09/2014 with ligation of duodenal ulcer. General surgery on board recommended to initiate diet. - Status post multiple transfusions. - Started on TNA, now diet has been advanced..  Sepsis/Sirs/New Acute respiratory failure on 12.29.2015 due to pneumonia and empyema: - Lactic acid 1.6. CT scan of the CHest shows new right lower lobe pneumonia with empyema. - Continue fluid resuscitation and blood cultures are pending. - Currently on HIV medications.  -Started empirically on vancomycin, Zosyn and micafungin on 05/17/2014. - repeat IV lasix had good UOP with improvement in respiratory status. Urine is clear.   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 in the setting of hemorrhagic shock/Hyperkalemia: - Initially due to hemorrhagic shock which is resolved. - Monitor renal fx, urine outpt, electrolytes - Continue IV fluid resuscitation. Monitor strict I's and O's. - baseline Cr 1.6-2.2  Bleeding duodenal ulcer s/p laparotomy 12/21. -Post-op care per [facility]CS -TNA for nutrition, surgery has recommended to advance diet.    Protein-calorie malnutrition, severe - Currently on TNA - Tolerating diet orally.   Renal failure  Human immunodeficiency virus (HIV) disease - Continue HIV medications.    Code Status: presumed full Family Communication: None at bedside Disposition Plan: Pending improvement in condition   Consultants:  CT surgery  Gen. Surgery  PCCM  Procedures: CT abdomen and pelvis on 05/17/2014: Defect within the ventral wall of the distal stomach/ proximal duodenum is identified and presumably represents patient's ulcer.  No extravasation of contrast material identified however. And there is no significant free intraperitoneal air identified elsewhere in the abdomen or pelvis.  Extensively loculated right pleural effusion with abnormal enhancing pleural. Findings may be the sequelae of malignancy or infection.  CT chest without contrast on 05/08/2014: Multifocal right lung pneumonia, right lower lobe predominant. Right empyema 12/10 abdominal ultrasound> 13 mm gallstone, mild tenderness over gallbladder without gallbladder thickening, possible left upper pole renal calculus, right pleural effusion noted 12/10 renal ultrasound> no hydronephrosis 12/11 HIDA scan> patent cystic duct without evidence of acute cholecystitis 12/11 Echo> LVEF 65%, RV normal. 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 JP drain placement   Antibiotics:  Vancomycin Zosyn and micafungin started on 05/17/2014  HPI/Subjective: He is breathing is significantly better.  Objective: Filed Vitals:   05/18/14 0132 05/18/14 0245 05/18/14 0327 05/18/14 0740  BP:   114/62 102/59  Pulse:   87 101  Temp:   98 F (36.7 C) 98.6 F (37 C)  TempSrc:   Oral Oral  Resp:   23 26  Height:       Weight:  SpO2: 95% 79% 97% 92%    Intake/Output Summary (Last 24 hours) at 05/18/14 0813 Last data filed at 05/18/14 0800  Gross per 24 hour  Intake   5485 ml  Output 4902.5 ml  Net  582.5 ml   Filed Weights   05/03/14 2205 05/05/14 1830 05/09/14 0530  Weight: 56.836 kg (125 lb 4.8 oz) 55.5 kg (122 lb 5.7 oz) 56.9 kg (125 lb 7.1 oz)    Exam:  General: Alert, awake, oriented x3, in no acute distress.  HEENT: No bruits, no goiter.  Heart: Regular rate and rhythm. Lungs: Good air movement, wheezing B/l Abdomen: Soft, nontender, nondistended, positive bowel sounds.     Data Reviewed: Basic Metabolic Panel:  Recent Labs Lab 05/12/14 0214  05/14/14 0420 05/15/14 0320 05/16/14 0300 05/17/14 0425 05/17/14 1150 05/18/14 0445  NA 145  < > 145 149* 144 143  --  140  K 4.3  < > 3.7 3.3* 4.5 3.4*  --  3.8  CL 116*  < > 114* 122* 114* 118*  --  109  CO2 23  < > 23 21 24 19   --  25  GLUCOSE 94  < > 102* 107* 131* 94  --  140*  BUN 41*  < > 53* 48* 56* 47*  --  54*  CREATININE 2.35*  < > 1.98* 1.49* 1.66* 1.40*  --  2.04*  CALCIUM 8.1*  < > 8.1* 6.7* 8.0* 6.0*  --  7.5*  MG 1.9  --   --   --  2.1  --  2.0  --   PHOS 4.2  --   --   --  5.1* 3.9 4.9*  --   < > = values in this interval not displayed. Liver Function Tests:  Recent Labs Lab 05/12/14 0214 05/16/14 0300  AST 35 52*  ALT 30 63*  ALKPHOS 76 143*  BILITOT 0.5 0.4  PROT 4.4* 4.6*  ALBUMIN 1.6* 1.4*   No results for input(s): LIPASE, AMYLASE in the last 168 hours. No results for input(s): AMMONIA in the last 168 hours. CBC:  Recent Labs Lab 05/14/14 1840 05/15/14 0300 05/15/14 1500 05/15/14 2055 05/16/14 0300 05/18/14 0445  WBC 9.4 9.0 13.6*  --  10.2 11.8*  NEUTROABS  --   --   --   --  7.9* 9.9*  HGB 11.2* 9.2* 7.7* 10.2* 10.0* 9.0*  HCT 33.6* 27.7* 23.5* 31.9* 31.3* 28.2*  MCV 92.1 93.3 94.4  --  94.6 94.3  PLT 251 218 300  --  227 222   Cardiac Enzymes: No results for input(s):  CKTOTAL, CKMB, CKMBINDEX, TROPONINI in the last 168 hours. BNP (last 3 results)  Recent Labs  04/21/14 1131  PROBNP 25.0   CBG:  Recent Labs Lab 05/17/14 1551 05/17/14 1939 05/17/14 2351 05/18/14 0340 05/18/14 0741  GLUCAP 114* 157* 114* 146* 135*    No results found for this or any previous visit (from the past 240 hour(s)).   Studies: Ct Chest Wo Contrast  05/17/2014   CLINICAL DATA:  Sepsis, recurrent right pleural effusion, shortness of breath, hypoxia. HIV.  EXAM: CT CHEST WITHOUT CONTRAST  TECHNIQUE: Multidetector CT imaging of the chest was performed following the standard protocol without IV contrast.  COMPARISON:  Chest radiograph dated 05/17/2014  FINDINGS: Evaluation is constrained by lack of intravenous contrast administration. The right lung base is partially imaged on separate enhanced CT abdomen/ pelvis.  Multifocal patchy opacities in the right lung, predominantly in the right  middle and right lower lobe. Dense right lower lobe consolidation, most of which enhances following contrast administration. Associated pleural thickening.  Complex right pleural effusion with gas, reflecting an empyema. Trace left pleural effusion.  Underlying moderate to severe centrilobular and paraseptal emphysematous changes. No pneumothorax.  Visualized thyroid is unremarkable.  The heart is top-normal in size. Trace pericardial fluid. Mild atherosclerotic calcifications of the aortic arch.  Small thoracic lymph nodes, including:  --9 mm short axis right supraclavicular node (series 21/ image 5)  --7 mm short axis right paratracheal node (series 21/ image 22)  --10 mm short axis subcarinal node (series 21/ image 26)  Right IJ venous catheter which terminates at the cavoatrial junction.  CT abdomen pelvis will be dictated separately.  Mild degenerative changes of the thoracic spine.  IMPRESSION: Multifocal right lung pneumonia, right lower lobe predominant.  Associated right empyema.  Mild thoracic  lymphadenopathy.   Electronically Signed   By: Julian Hy M.D.   On: 05/17/2014 18:14   Ct Abdomen Pelvis W Contrast  05/17/2014   CLINICAL DATA:  HIV with recurrent sepsis. Recurrent pleural effusion on the right. Shortness of breath.  EXAM: CT ABDOMEN AND PELVIS WITH CONTRAST  TECHNIQUE: Multidetector CT imaging of the abdomen and pelvis was performed using the standard protocol following bolus administration of intravenous contrast.  CONTRAST:  100 cc of Omni 300  COMPARISON:  04/25/2014  FINDINGS: Lower chest: Moderate to large loculated right pleural effusion is identified which contains multiple fluid levels. Enhancing rind of soft tissue overlies the right pleural effusion. There is extensive enhancement of both the parietal and visceral pleural. Small left pleural effusion is noted. Prominent lymph nodes are identified at the right CP angle. The largest measures 1 cm, image 13/ series 201.  Hepatobiliary: The liver has a slightly nodular contour. Low-attenuation structure within the lateral segment of left hepatic lobe measures 5 mm, image 28/series 201. Stone within the gallbladder is identified measuring 9 mm. No biliary dilatation identified.  Pancreas: Unremarkable appearance of the pancreas.  Spleen: The spleen is normal.  Adrenals/Urinary Tract: The adrenal glands are both normal. Normal appearance of the kidneys. The urinary bladder is collapsed around a Foley catheter balloon.  Stomach/Bowel: The stomach appears normal. There appears to be a defect associated with the anterior wall of the distal stomach or proximal duodenum, image 32/series 201. A surgical drainage catheter is identified in this area. There is no extravasation of contrast material. The mid and distal small bowel loops appear normal. Unremarkable appearance of the colon. A rectal tube is in place. No significant free intraperitoneal air are identified.  Vascular/Lymphatic: Calcified atherosclerotic disease involves the  abdominal aorta. The abdominal aorta measures 2.6 cm in maximum AP dimension. There is no retroperitoneal or mesenteric adenopathy.  Reproductive: Prostate gland and seminal vesicles are unremarkable.  Other: No focal fluid collections identified. Ventral abdominal wall wound is identified.  Musculoskeletal: Review of the visualized bony structures is unremarkable.  IMPRESSION: 1. Defect within the ventral wall of the distal stomach/ proximal duodenum is identified and presumably represents patient's ulcer. There is a small amount of air which appears extra luminal. This is directly adjacent to the indwelling surgical drain. No extravasation of contrast material identified however. And there is no significant free intraperitoneal air identified elsewhere in the abdomen or pelvis. 2. Extensively loculated right pleural effusion with abnormal enhancing pleural. Findings may be the sequelae of malignancy or infection. See CT chest report from today. 3. Gallstone 4. Atherosclerosis.  Ectatic abdominal aorta at risk for aneurysm development. Recommend followup by [place] in 5 years. This recommendation follows ACR consensus guidelines: White Paper of the ACR Incidental Findings Committee II on Vascular Findings. J Am Coll Radiol 2013; 10:789-794.   Electronically Signed   By: Kerby Moors M.D.   On: 05/17/2014 18:05   Dg Chest Port 1 View  05/17/2014   CLINICAL DATA:  Difficulty breathing  EXAM: PORTABLE CHEST - 1 VIEW  COMPARISON:  May 16, 2014  FINDINGS: Central catheter tip is in the superior vena cava near the cavoatrial junction, stable. A small loculated pneumothorax in the right base remains stable. No new or expanding pneumothorax. Consolidation with loculated effusion on the right persists. There is generalized interstitial prominence,, largely due to underlying interstitial fibrosis. There may be some superimposed interstitial edema. Heart size and pulmonary vascularity are normal. No adenopathy.   IMPRESSION: Persistent loculated pneumothorax right base. Right sided pleural effusion, at least partly loculated. Suspect underlying interstitial fibrosis, although there may be a degree of superimposed interstitial edema. There is consolidation right base, stable. No change in cardiac silhouette. No new opacity.   Electronically Signed   By: Lowella Grip M.D.   On: 05/17/2014 08:11   Dg Chest Port 1 View  05/16/2014   CLINICAL DATA:  Shortness of breath.  EXAM: PORTABLE CHEST - 1 VIEW  COMPARISON:  05/15/2014.  FINDINGS: Right IJ line in stable position. Heart size stable. Pulmonary vascularity normal. Stable right base tiny loculated pneumothorax. Stable right base atelectasis and/or infiltrate and right pleural effusion.  IMPRESSION: 1. Stable residual loculated right base pneumothorax. 2. Stable right base atelectasis and/or infiltrate with right pleural effusion. 3. Right IJ line in stable position.   Electronically Signed   By: Marcello Moores  Register   On: 05/16/2014 09:16    Scheduled Meds: . antiseptic oral rinse  7 mL Mouth Rinse q12n4p  . chlorhexidine  15 mL Mouth Rinse BID  . feeding supplement (RESOURCE BREEZE)  1 Container Oral TID BM  . insulin aspart  0-15 Units Subcutaneous 6 times per day  . levalbuterol  1.25 mg Nebulization Q6H  . micafungin (MYCAMINE) IV  100 mg Intravenous Daily  . pantoprazole (PROTONIX) IV  40 mg Intravenous Q12H  . piperacillin-tazobactam (ZOSYN)  IV  3.375 g Intravenous Q8H  . vancomycin  500 mg Intravenous Q12H   Continuous Infusions: . Marland KitchenTPN (CLINIMIX-E) Adult 75 mL/hr at 05/17/14 1755   And  . fat emulsion 250 mL (05/17/14 1754)  . Marland KitchenTPN (CLINIMIX-E) Adult     And  . fat emulsion       Charlynne Cousins  [facility] Pager [phone].  If 8PM-8AM, please contact night-coverage at [email or web address], password [facility] 05/18/2014, 8:13 AM  LOS: 20 days

## 2014-05-18 NOTE — Progress Notes (Signed)
ANTIBIOTIC CONSULT NOTE - FOLLOW UP  Pharmacy Consult:  Vancomycin Indication:  Sepsis  No Known Allergies  Patient Measurements: Height: 5\' 6"  (167.6 cm) Weight: 125 lb 7.1 oz (56.9 kg) IBW/kg (Calculated) : 63.8  Vital Signs: Temp: 98.6 F (37 C) (12/30 0740) Temp Source: Oral (12/30 0740) BP: 102/59 mmHg (12/30 0740) Pulse Rate: 101 (12/30 0740) Intake/Output from previous day: 12/29 0701 - 12/30 0700 In: 8144 [P.O.:600; IV Piggyback:2810; TPN:2040] Out: 4992.5 [Urine:4350; Drains:92.5; Stool:550] Intake/Output from this shift: Total I/O In: 120 [P.O.:120] Out: 860 [Urine:850; Drains:10]  Labs:  Recent Labs  05/15/14 1500 05/15/14 2055 05/16/14 0300 05/17/14 0425 05/18/14 0445  WBC 13.6*  --  10.2  --  11.8*  HGB 7.7* 10.2* 10.0*  --  9.0*  PLT 300  --  227  --  222  CREATININE  --   --  1.66* 1.40* 2.04*   Estimated Creatinine Clearance: 28.7 mL/min (by C-G formula based on Cr of 2.04). No results for input(s): VANCOTROUGH, VANCOPEAK, VANCORANDOM, GENTTROUGH, GENTPEAK, GENTRANDOM, TOBRATROUGH, TOBRAPEAK, TOBRARND, AMIKACINPEAK, AMIKACINTROU, AMIKACIN in the last 72 hours.   Microbiology: Recent Results (from the past 720 hour(s))  Body fluid culture     Status: None   Collection Time: 04/25/14 11:57 AM  Result Value Ref Range Status   Specimen Description FLUID RIGHT PLEURAL  Final   Special Requests NONE  Final   Gram Stain   Final    RARE WBC PRESENT, PREDOMINANTLY PMN NO ORGANISMS SEEN Performed at [facility]    Culture   Final    NO GROWTH 3 DAYS Performed at [facility]    Report Status 04/28/2014 FINAL  Final  Surgical pcr screen     Status: None   Collection Time: 05/04/14  9:12 PM  Result Value Ref Range Status   MRSA, PCR NEGATIVE NEGATIVE Final   Staphylococcus aureus NEGATIVE NEGATIVE Final    Comment:        The Xpert SA Assay (FDA approved for NASAL specimens in patients over 67 years of age), is one component  of a comprehensive surveillance program.  Test performance has been validated by [employer] for patients greater than or equal to 53 year old. It is not intended to diagnose infection nor to guide or monitor treatment.   Anaerobic culture     Status: None   Collection Time: 05/05/14  3:08 PM  Result Value Ref Range Status   Specimen Description FLUID PLEURAL RIGHT  Final   Special Requests NONE  Final   Gram Stain   Final    NO WBC SEEN NO ORGANISMS SEEN Performed at [facility]    Culture   Final    NO ANAEROBES ISOLATED Performed at [facility]    Report Status 05/10/2014 FINAL  Final  Body fluid culture     Status: None   Collection Time: 05/05/14  3:08 PM  Result Value Ref Range Status   Specimen Description FLUID PLEURAL RIGHT  Final   Special Requests NONE  Final   Gram Stain   Final    NO WBC SEEN NO ORGANISMS SEEN Performed at [facility]    Culture   Final    NO GROWTH 3 DAYS Performed at [facility]    Report Status 05/08/2014 FINAL  Final  Culture, blood (routine x 2)     Status: None (Preliminary result)   Collection Time: 05/17/14 11:40 AM  Result Value Ref Range Status  Specimen Description BLOOD LEFT ANTECUBITAL  Final   Special Requests BOTTLES DRAWN AEROBIC AND ANAEROBIC 10CC  Final   Culture   Final           BLOOD CULTURE RECEIVED NO GROWTH TO DATE CULTURE WILL BE HELD FOR 5 DAYS BEFORE ISSUING A FINAL NEGATIVE REPORT Performed at [facility]    Report Status PENDING  Incomplete  Culture, blood (routine x 2)     Status: None (Preliminary result)   Collection Time: 05/17/14 11:50 AM  Result Value Ref Range Status   Specimen Description BLOOD RIGHT ANTECUBITAL  Final   Special Requests BOTTLES DRAWN AEROBIC AND ANAEROBIC [place]  Final   Culture   Final           BLOOD CULTURE RECEIVED NO GROWTH TO DATE CULTURE WILL BE HELD FOR 5 DAYS BEFORE ISSUING A FINAL NEGATIVE REPORT Performed at [facility]    Report Status PENDING  Incomplete      Assessment: 68 YOM with history of HIV admitted on 04/28/14 with abdominal pain and pleural effusion.  Patient is s/p VATS on 05/05/14 from which he was placed on Zosyn for 3 days as empiric coverage.  His HIV medications were held on 05/09/14 due to prolonged ileus limiting their absorption.  Patient to continue on vancomycin, Zosyn, and Mycamine for sepsis.  He has AKI and contributing factors include antibiotics, Lasix, and contrast received on 05/17/14.  +HIV - on Epivir, Retrovir and Evotaz. Evotaz changed to Dar/Cobi to avoid PPI interaction >> all held 12/21  Zosyn 12/17 >> 12/20, resumed 12/29 >> Vanc 12/29 >> Mycamine 12/39 >>  12/17 pleural fluid cx - negative 12/17 anaerobic pleural fluid cx - negative 12/17 MRSA PCR - negative 12/29 BCx x2 - NGTD 12/29 TA cx -   Goal of Therapy:  Vancomycin trough level 15-20 mcg/ml   Plan:  - Decrease vanc to 500mg  IV Q24H - Continue Zosyn 3.375gm IV Q8H, 4 hr infusion - Monitor renal fxn, clinical progress, vanc trough as indicated    [name] [name] D. Mina Marble, PharmD, BCPS Pager:  [phone] 05/18/2014, 10:01 AM

## 2014-05-18 NOTE — Progress Notes (Signed)
Physical Therapy Treatment Patient Details Name: Jesse Nosbisch MRN: [id number] DOB: 1945-08-02 Today's Date: 05/18/2014    History of Present Illness 68 y/o male with HIV followed by Dr. [name] for an exudative effusion was admitted on 12/10 for pain in his abdomen and recurrence of the pleural effusion which had been sampled just three days prior. s/p VATS for lung biopsy 12/17. s/p EXPLORATORY LAPAROTOMY and DUODENOTOMY with Primary ligation Bleeding Duodenal Ulcer  12/21.    PT Comments    Pt was motivated to ambulate this date however then had onset of rectal pain limiting amb to 5'. Pt did sit up in chair and RN staff said they would try to ambulate later with patient. Pt to still benefit from rehab upon d/c to achieve functional independence.   Follow Up Recommendations  CIR     Equipment Recommendations       Recommendations for Other Services Rehab consult;OT consult     Precautions / Restrictions Precautions Precaution Comments: multiple lines, flexiseal Restrictions Weight Bearing Restrictions: No    Mobility  Bed Mobility Overal bed mobility: Needs Assistance Bed Mobility: Supine to Sit     Supine to sit: Supervision     General bed mobility comments: increased time, use of bed rail  Transfers Overall transfer level: Needs assistance Equipment used: Rolling walker (2 wheeled) Transfers: Sit to/from Stand Sit to Stand: Min guard         General transfer comment: v/c's for safe hand placement  Ambulation/Gait Ambulation/Gait assistance: Min assist;+2 safety/equipment Ambulation Distance (Feet): 5 Feet Assistive device: Rolling walker (2 wheeled) Gait Pattern/deviations: Step-through pattern     General Gait Details: limited by onset of rectal pain/pressure. pt reports "I need to have a BM, I need to sit." pt educated on flexiseal however pt in pain and refused to amb. pt did transfer to chair.   Stairs            Wheelchair Mobility     Modified Rankin (Stroke Patients Only)       Balance                                    Cognition Arousal/Alertness: Awake/alert Behavior During Therapy: WFL for tasks assessed/performed Overall Cognitive Status: Within Functional Limits for tasks assessed                      Exercises General Exercises - Lower Extremity Ankle Circles/Pumps: AROM;Both;10 reps;Seated Long Arc Quad: Strengthening;Both;10 reps;Seated Hip ABduction/ADduction: Strengthening;10 reps;AROM    General Comments        Pertinent Vitals/Pain Pain Assessment: No/denies pain    Home Living                      Prior Function            PT Goals (current goals can now be found in the care plan section) Progress towards PT goals: Progressing toward goals    Frequency  Min 3X/week    PT Plan Current plan remains appropriate    Co-evaluation             End of Session Equipment Utilized During Treatment: Gait belt;Oxygen Activity Tolerance: Patient limited by pain Patient left: in chair;with call bell/phone within reach;with nursing/sitter in room     Time: [id number] PT Time Calculation (min) (ACUTE ONLY): 20 min  Charges:  $Gait Training: 8-22 mins  G CodesKingsley Callander 05/18/2014, 12:36 PM  Kittie Plater, PT, DPT Pager #: [phone] Office #: [phone]

## 2014-05-18 NOTE — Progress Notes (Addendum)
      [address]       [place] [address]             [phone]       9 Days Post-Op Procedure(s) (LRB): EXPLORATORY LAPAROTOMY (N/A) DUODENOTOMY with Primary ligation Bleeding Duodenal Ulcer (N/A)  Subjective: Patient still with tachypnea this am. He states he feels better today.  Objective: Vital signs in last 24 hours: Temp:  [98 F (36.7 C)-99.6 F (37.6 C)] 98.6 F (37 C) (12/30 0740) Pulse Rate:  [83-118] 101 (12/30 0740) Cardiac Rhythm:  [-] Sinus tachycardia (12/30 0750) Resp:  [23-34] 26 (12/30 0740) BP: (102-114)/(51-62) 102/59 mmHg (12/30 0740) SpO2:  [79 %-97 %] 92 % (12/30 0740) FiO2 (%):  [50 %] 50 % (12/30 0327)    Intake/Output from previous day: 12/29 0701 - 12/30 0700 In: 5450 [P.O.:600; IV Piggyback:2810; TPN:2040] Out: 4992.5 [Urine:4350; Drains:92.5; Stool:550]   Physical Exam:  Cardiovascular: Slightly tachycardic Pulmonary: Bilateral wheezing. Diminished at bases R Abdomen: Soft, non tender, bowel sounds present. Wounds: Clean and dry.  No erythema or signs of infection.   Lab Results: CBC:  Recent Labs  05/16/14 0300 05/18/14 0445  WBC 10.2 11.8*  HGB 10.0* 9.0*  HCT 31.3* 28.2*  PLT 227 222   BMET:   Recent Labs  05/17/14 0425 05/18/14 0445  NA 143 140  K 3.4* 3.8  CL 118* 109  CO2 19 25  GLUCOSE 94 140*  BUN 47* 54*  CREATININE 1.40* 2.04*  CALCIUM 6.0* 7.5*    PT/INR: No results for input(s): LABPROT, INR in the last 72 hours. ABG:  INR: Will add last result for INR, ABG once components are confirmed Will add last 4 CBG results once components are confirmed  Assessment/Plan:  1. CV - ST in the low 100's. 2.  Pulmonary - on 6 liters of oxygen via [place] with oxygen saturation 95%. CXR has shown persistent, loculated right pneumothorax/effusion.   3.Anemia-H and H down to 9 and 28.2. Previous GI bleed 4. Creatinine up from 1.4 to 2.04 5. Urinary retention-foley  6.GI-On TPN and clears.Per  general surgery. CT results noted 7. ID- on Zosyn and Vanco for probable PNA 8. Management per medicine, CCM  ZIMMERMAN,DONIELLE MPA-C 05/18/2014,8:20 AM  Chart reviewed, patient examined, agree with above. There is nothing else to do from a thoracic surgery perspective. The loculated right effusion is a persistent space from trapped lung that will remain as it is. He will need oncology evaluation if and when he recovers from his GI issues.

## 2014-05-18 NOTE — Progress Notes (Signed)
[facility] Progress Note  9 Days Post-Op  Subjective: Pt feels much better.  No N/V, tolerating clears.  Mobilizing with PT recommending CIR.  Says he has some SOB.   +Bm's.  Had to have foley replaced for urinary retention.   Objective: Vital signs in last 24 hours: Temp:  [98 F (36.7 C)-99.6 F (37.6 C)] 98.6 F (37 C) (12/30 0740) Pulse Rate:  [83-118] 102 (12/30 1100) Resp:  [23-34] 26 (12/30 0740) BP: (102-114)/(51-66) 103/66 mmHg (12/30 1100) SpO2:  [79 %-97 %] 92 % (12/30 0740) FiO2 (%):  [50 %] 50 % (12/30 0327) Last BM Date: 05/18/14 (flexiseal in place)  Intake/Output from previous day: 12/29 0701 - 12/30 0700 In: 5450 [P.O.:600; IV Piggyback:2810; TPN:2040] Out: 4992.5 [Urine:4350; Drains:92.5; Stool:550] Intake/Output this shift: Total I/O In: 510 [P.O.:240; IV Piggyback:100; TPN:170] Out: 1260 [Urine:1250; Drains:10]  PE: Gen:  Alert, NAD, pleasant Card:  RRR, no M/G/R heard Pulm:  Good effort, course breath sounds with inspiration/expiration, diminished bs on right Abd: Soft, NT/ND, +BS, no HSM, midline wound clean and dry, drain with minimal serous drainage (29mL/24hr), no abdominal scars noted   Lab Results:   Recent Labs  05/16/14 0300 05/18/14 0445  WBC 10.2 11.8*  HGB 10.0* 9.0*  HCT 31.3* 28.2*  PLT 227 222   BMET  Recent Labs  05/17/14 0425 05/18/14 0445  NA 143 140  K 3.4* 3.8  CL 118* 109  CO2 19 25  GLUCOSE 94 140*  BUN 47* 54*  CREATININE 1.40* 2.04*  CALCIUM 6.0* 7.5*   PT/INR No results for input(s): LABPROT, INR in the last 72 hours. CMP     Component Value Date/Time   NA 140 05/18/2014 0445   K 3.8 05/18/2014 0445   CL 109 05/18/2014 0445   CO2 25 05/18/2014 0445   GLUCOSE 140* 05/18/2014 0445   BUN 54* 05/18/2014 0445   CREATININE 2.04* 05/18/2014 0445   CREATININE 2.20* 01/18/2014 1107   CALCIUM 7.5* 05/18/2014 0445   PROT 4.6* 05/16/2014 0300   ALBUMIN 1.4* 05/16/2014 0300   AST 52*  05/16/2014 0300   ALT 63* 05/16/2014 0300   ALKPHOS 143* 05/16/2014 0300   BILITOT 0.4 05/16/2014 0300   GFRNONAA 32* 05/18/2014 0445   GFRAA 37* 05/18/2014 0445   Lipase     Component Value Date/Time   LIPASE 30 04/28/2014 0730       Studies/Results: Ct Chest Wo Contrast  05/17/2014   CLINICAL DATA:  Sepsis, recurrent right pleural effusion, shortness of breath, hypoxia. HIV.  EXAM: CT CHEST WITHOUT CONTRAST  TECHNIQUE: Multidetector CT imaging of the chest was performed following the standard protocol without IV contrast.  COMPARISON:  Chest radiograph dated 05/17/2014  FINDINGS: Evaluation is constrained by lack of intravenous contrast administration. The right lung base is partially imaged on separate enhanced CT abdomen/ pelvis.  Multifocal patchy opacities in the right lung, predominantly in the right middle and right lower lobe. Dense right lower lobe consolidation, most of which enhances following contrast administration. Associated pleural thickening.  Complex right pleural effusion with gas, reflecting an empyema. Trace left pleural effusion.  Underlying moderate to severe centrilobular and paraseptal emphysematous changes. No pneumothorax.  Visualized thyroid is unremarkable.  The heart is top-normal in size. Trace pericardial fluid. Mild atherosclerotic calcifications of the aortic arch.  Small thoracic lymph nodes, including:  --9 mm short axis right supraclavicular node (series 21/ image 5)  --7 mm short axis right paratracheal node (series 21/ image 22)  --  10 mm short axis subcarinal node (series 21/ image 26)  Right IJ venous catheter which terminates at the cavoatrial junction.  CT abdomen pelvis will be dictated separately.  Mild degenerative changes of the thoracic spine.  IMPRESSION: Multifocal right lung pneumonia, right lower lobe predominant.  Associated right empyema.  Mild thoracic lymphadenopathy.   Electronically Signed   By: Julian Hy M.D.   On: 05/17/2014 18:14    Ct Abdomen Pelvis W Contrast  05/17/2014   CLINICAL DATA:  HIV with recurrent sepsis. Recurrent pleural effusion on the right. Shortness of breath.  EXAM: CT ABDOMEN AND PELVIS WITH CONTRAST  TECHNIQUE: Multidetector CT imaging of the abdomen and pelvis was performed using the standard protocol following bolus administration of intravenous contrast.  CONTRAST:  100 cc of Omni 300  COMPARISON:  04/25/2014  FINDINGS: Lower chest: Moderate to large loculated right pleural effusion is identified which contains multiple fluid levels. Enhancing rind of soft tissue overlies the right pleural effusion. There is extensive enhancement of both the parietal and visceral pleural. Small left pleural effusion is noted. Prominent lymph nodes are identified at the right CP angle. The largest measures 1 cm, image 13/ series 201.  Hepatobiliary: The liver has a slightly nodular contour. Low-attenuation structure within the lateral segment of left hepatic lobe measures 5 mm, image 28/series 201. Stone within the gallbladder is identified measuring 9 mm. No biliary dilatation identified.  Pancreas: Unremarkable appearance of the pancreas.  Spleen: The spleen is normal.  Adrenals/Urinary Tract: The adrenal glands are both normal. Normal appearance of the kidneys. The urinary bladder is collapsed around a Foley catheter balloon.  Stomach/Bowel: The stomach appears normal. There appears to be a defect associated with the anterior wall of the distal stomach or proximal duodenum, image 32/series 201. A surgical drainage catheter is identified in this area. There is no extravasation of contrast material. The mid and distal small bowel loops appear normal. Unremarkable appearance of the colon. A rectal tube is in place. No significant free intraperitoneal air are identified.  Vascular/Lymphatic: Calcified atherosclerotic disease involves the abdominal aorta. The abdominal aorta measures 2.6 cm in maximum AP dimension. There is no  retroperitoneal or mesenteric adenopathy.  Reproductive: Prostate gland and seminal vesicles are unremarkable.  Other: No focal fluid collections identified. Ventral abdominal wall wound is identified.  Musculoskeletal: Review of the visualized bony structures is unremarkable.  IMPRESSION: 1. Defect within the ventral wall of the distal stomach/ proximal duodenum is identified and presumably represents patient's ulcer. There is a small amount of air which appears extra luminal. This is directly adjacent to the indwelling surgical drain. No extravasation of contrast material identified however. And there is no significant free intraperitoneal air identified elsewhere in the abdomen or pelvis. 2. Extensively loculated right pleural effusion with abnormal enhancing pleural. Findings may be the sequelae of malignancy or infection. See CT chest report from today. 3. Gallstone 4. Atherosclerosis. Ectatic abdominal aorta at risk for aneurysm development. Recommend followup by [place] in 5 years. This recommendation follows ACR consensus guidelines: White Paper of the ACR Incidental Findings Committee II on Vascular Findings. J Am Coll Radiol 2013; 10:789-794.   Electronically Signed   By: Kerby Moors M.D.   On: 05/17/2014 18:05   Dg Chest Port 1 View  05/17/2014   CLINICAL DATA:  Difficulty breathing  EXAM: PORTABLE CHEST - 1 VIEW  COMPARISON:  May 16, 2014  FINDINGS: Central catheter tip is in the superior vena cava near the cavoatrial junction,  stable. A small loculated pneumothorax in the right base remains stable. No new or expanding pneumothorax. Consolidation with loculated effusion on the right persists. There is generalized interstitial prominence,, largely due to underlying interstitial fibrosis. There may be some superimposed interstitial edema. Heart size and pulmonary vascularity are normal. No adenopathy.  IMPRESSION: Persistent loculated pneumothorax right base. Right sided pleural effusion, at least  partly loculated. Suspect underlying interstitial fibrosis, although there may be a degree of superimposed interstitial edema. There is consolidation right base, stable. No change in cardiac silhouette. No new opacity.   Electronically Signed   By: Lowella Grip M.D.   On: 05/17/2014 08:11    Anti-infectives: Anti-infectives    Start     Dose/Rate Route Frequency Ordered Stop   05/19/14 0200  vancomycin (VANCOCIN) 500 mg in sodium chloride 0.9 % 100 mL IVPB     500 mg100 mL/hr over 60 Minutes Intravenous Every 24 hours 05/18/14 1002     05/18/14 0200  vancomycin (VANCOCIN) 500 mg in sodium chloride 0.9 % 100 mL IVPB  Status:  Discontinued     500 mg100 mL/hr over 60 Minutes Intravenous Every 12 hours 05/17/14 1059 05/18/14 1002   05/17/14 1500  micafungin (MYCAMINE) 100 mg in sodium chloride 0.9 % 100 mL IVPB     100 mg100 mL/hr over 1 Hours Intravenous Daily 05/17/14 1425     05/17/14 1430  micafungin (MYCAMINE) 100 mg in sodium chloride 0.9 % 100 mL IVPB  Status:  Discontinued     100 mg100 mL/hr over 1 Hours Intravenous Daily 05/17/14 1425 05/17/14 1440   05/17/14 1200  piperacillin-tazobactam (ZOSYN) IVPB 3.375 g     3.375 g12.5 mL/hr over 240 Minutes Intravenous Every 8 hours 05/17/14 1059     05/17/14 1100  vancomycin (VANCOCIN) IVPB 1000 mg/200 mL premix     1,000 mg200 mL/hr over 60 Minutes Intravenous  Once 05/17/14 1059 05/17/14 1312   05/09/14 1815  cefoTEtan (CEFOTAN) 1 g in dextrose 5 % 50 mL IVPB     1 g100 mL/hr over 30 Minutes Intravenous  Once 05/09/14 1805 05/09/14 1720   05/05/14 1900  piperacillin-tazobactam (ZOSYN) IVPB 3.375 g     3.375 g12.5 mL/hr over 240 Minutes Intravenous Every 8 hours 05/05/14 1533 05/08/14 1520   05/05/14 0600  cefUROXime (ZINACEF) 1.5 g in dextrose 5 % 50 mL IVPB     1.5 g100 mL/hr over 30 Minutes Intravenous On call to O.R. 05/04/14 1106 05/05/14 1345   05/03/14 0800  darunavir-cobicistat (PREZCOBIX) 800-150 MG per tablet 1 tablet  Status:   Discontinued     1 tablet Oral Daily with breakfast 05/02/14 1404 05/09/14 2051   05/01/14 1000  lamiVUDine (EPIVIR) 10 MG/ML solution 100 mg  Status:  Discontinued     100 mg Oral Daily 04/30/14 1354 05/09/14 2051   04/30/14 1000  zidovudine (RETROVIR) capsule 300 mg  Status:  Discontinued     300 mg Oral Every 12 hours 04/30/14 0922 05/09/14 2051   04/29/14 1800  atazanavir-cobicistat (Evotaz) 300-150 MG per tablet 1 tablet  Status:  Discontinued     1 tablet Oral Daily with breakfast 04/29/14 1408 05/02/14 1404   04/29/14 1000  cefTRIAXone (ROCEPHIN) 1 g in dextrose 5 % 50 mL IVPB - Premix  Status:  Discontinued     1 g100 mL/hr over 30 Minutes Intravenous Every 24 hours 04/29/14 0856 05/03/14 1214   04/28/14 1615  lamiVUDine (EPIVIR) tablet 150 mg  Status:  Discontinued  150 mg Oral Daily 04/28/14 1454 04/30/14 1354   04/28/14 1600  zidovudine (RETROVIR) tablet 300 mg  Status:  Discontinued     300 mg Oral 2 times daily 04/28/14 1454 04/30/14 0921     Antibiotics: Zosyn 12/17 >> 12/20, resumed 12/29 >> Vanc 12/29 >> Mycamine 12/39 >>  Cultures: 12/17 pleural fluid cx - negative 12/17 anaerobic pleural fluid cx - negative 12/17 MRSA PCR - negative 12/29 BCx x2 - NGTD 12/29 TA cx -  SIGNIFICANT EVENTS: 12/17 VATS 12/19 no distress. Still has airleak, but think that this is possibly d/t ant CT being out of place 12/21 Recurrent GI bleed with shock. Transfusion of multiple units of RBCs. GI re-eval. CCS consult. 12/21 Emergent laparotomy: ligation of bleeding duodenal ulcer. Returned to ICU intubated and on vasopressors 12/22 Weaned off vasopressors. Extubated 12/29 New onset fevers/SIRS criteria. PCCM re-consulted.   STUDIES: 12/7 Outpatient thoracentesis by IR> exudative, cytology negative 12/10 abdominal ultrasound> 13 mm gallstone, mild tenderness over gallbladder without gallbladder thickening, possible left upper pole renal calculus, right pleural effusion noted 12/10  renal ultrasound> no hydronephrosis 12/11 HIDA scan> patent cystic duct without evidence of acute cholecystitis 12/11 Echo> LVEF 65%, RV normal. 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 JP drain placement 12/29 CT chest -Multifocal right lung pneumonia, right lower lobe predominant. Associated right loculated effusion CT abdomen - no contrast leak  Assessment/Plan Acute bleeding duodenal ulcer POD #9 s/p Ex Lap, duodenotomy, primary ligation bleeding duodenal ulcer Respiratory failure Elevated creatinine - 2.04 Leukocytosis - 11.8 S/p recent VATS showing carcinoma HIV Probable PNA  Plan: 1.  CT yesterday shows normal post op changes and resolution of contained leak, right pleural effusion concerning for infectious/malignant.  TCTS does not plan on any further operations or aspirations of the right pleural effusion per Dr. Vivi Martens note.  He needs Oncology evaluation at discharge. 2.  Advance diet to fulls 3.  Ambulate and IS 4.  SCD's and consider resuming heparin and closely monitoring H/H 5.  Protonix 6.  IV antibiotics Vancomycin day #2, Zosyn Day #2, Micafungin Day #2 for probable pneumonia    LOS: 20 days    [name] [name], [name] [name] 05/18/2014, 11:59 AM Pager: [phone]

## 2014-05-18 NOTE — Progress Notes (Signed)
PULMONARY / CRITICAL CARE MEDICINE   Name: [name] [name] MRN: [id number] DOB: [birth year]    ADMISSION DATE:  04/28/2014 CONSULTATION DATE:  04/28/2014  REFERRING MD :  Nils Pyle  CHIEF COMPLAINT:  S/P VATS  INITIAL PRESENTATION:  68 y/o male with HIV followed by Dr. [name] for an exudative effusion was admitted on 12/10 for pain in his abdomen and recurrence of the pleural effusion which had been sampled just three days prior. VATS showed carcinoma. Course complicated by DU ulcer bleed requiring laparotomy PCCM reconsulted  12/29 for severe sepsis  SIGNIFICANT EVENTS: 12/17 VATS 12/19 no distress. Still has airleak, but think that this is possibly d/t ant CT being out of place 12/21 Recurrent GI bleed with shock. Transfusion of multiple units of RBCs. GI re-eval. CCS consult. 12/21 Emergent laparotomy: ligation of bleeding duodenal ulcer. Returned to ICU intubated and on vasopressors 12/22 Weaned off vasopressors. Extubated 12/29 New onset fevers/SIRS criteria. PCCM re-consulted.   STUDIES:  12/7 Outpatient thoracentesis by IR> exudative, cytology negative 12/10 abdominal ultrasound> 13 mm gallstone, mild tenderness over gallbladder without gallbladder thickening, possible left upper pole renal calculus, right pleural effusion noted 12/10 renal ultrasound> no hydronephrosis 12/11 HIDA scan> patent cystic duct without evidence of acute cholecystitis 12/11 Echo> LVEF 65%, RV normal. 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 JP drain placement 12/29 CT chest -Multifocal right lung pneumonia, right lower lobe predominant. Associated right loculated effusion CT abdomen - no contrast leak INTERVAL:  Breathing ok BP better defervesced Good UO  VITAL SIGNS: Temp:   [98 F (36.7 C)-99.6 F (37.6 C)] 98.6 F (37 C) (12/30 0740) Pulse Rate:  [83-118] 101 (12/30 0740) Resp:  [23-34] 26 (12/30 0740) BP: (102-114)/(51-62) 102/59 mmHg (12/30 0740) SpO2:  [79 %-97 %] 92 % (12/30 0740) FiO2 (%):  [50 %] 50 % (12/30 0327) INTAKE / OUTPUT:  Intake/Output Summary (Last 24 hours) at 05/18/14 1016 Last data filed at 05/18/14 0800  Gross per 24 hour  Intake   5025 ml  Output 4602.5 ml  Net  422.5 ml    PHYSICAL EXAMINATION: General: Thin male in no acute distress, oob to chair Neuro: Alert, oriented, nonfocal HEENT:  [place]\AT, PERRL, no JVD noted Cardiovascular: regular Lungs: Respirations even and unlabored, coarse crackles  R>L Abdomen: Open abdominal wound site clean, JP drain Ext: no edema   LABS: CBC Recent Labs     05/15/14  1500  05/15/14  2055  05/16/14  0300  05/18/14  0445  WBC  13.6*   --   10.2  11.8*  HGB  7.7*  10.2*  10.0*  9.0*  HCT  23.5*  31.9*  31.3*  28.2*  PLT  300   --   227  222    Coag's No results for input(s): APTT, INR in the last 72 hours.  BMET Recent Labs     05/16/14  0300  05/17/14  0425  05/18/14  0445  NA  144  143  140  K  4.5  3.4*  3.8  CL  114*  118*  109  CO2  24  19  25   BUN  56*  47*  54*  CREATININE  1.66*  1.40*  2.04*  GLUCOSE  131*  94  140*    Electrolytes Recent Labs  05/16/14  0300  05/17/14  0425  05/17/14  1150  05/18/14  0445  CALCIUM  8.0*  6.0*   --   7.5*  MG  2.1   --   2.0   --   PHOS  5.1*  3.9  4.9*   --    ABG Recent Labs     05/16/14  1210  05/17/14  0800  PHART  7.402  7.501*  PCO2ART  35.9  24.2*  PO2ART  64.2*  47.4*    Liver Enzymes Recent Labs     05/16/14  0300  AST  52*  ALT  63*  ALKPHOS  143*  BILITOT  0.4  ALBUMIN  1.4*   Glucose Recent Labs     05/17/14  1321  05/17/14  1551  05/17/14  1939  05/17/14  2351  05/18/14  0340  05/18/14  0741  GLUCAP  113*  114*  157*  114*  146*  135*    Imaging Ct Chest Wo  Contrast  05/17/2014   CLINICAL DATA:  Sepsis, recurrent right pleural effusion, shortness of breath, hypoxia. HIV.  EXAM: CT CHEST WITHOUT CONTRAST  TECHNIQUE: Multidetector CT imaging of the chest was performed following the standard protocol without IV contrast.  COMPARISON:  Chest radiograph dated 05/17/2014  FINDINGS: Evaluation is constrained by lack of intravenous contrast administration. The right lung base is partially imaged on separate enhanced CT abdomen/ pelvis.  Multifocal patchy opacities in the right lung, predominantly in the right middle and right lower lobe. Dense right lower lobe consolidation, most of which enhances following contrast administration. Associated pleural thickening.  Complex right pleural effusion with gas, reflecting an empyema. Trace left pleural effusion.  Underlying moderate to severe centrilobular and paraseptal emphysematous changes. No pneumothorax.  Visualized thyroid is unremarkable.  The heart is top-normal in size. Trace pericardial fluid. Mild atherosclerotic calcifications of the aortic arch.  Small thoracic lymph nodes, including:  --9 mm short axis right supraclavicular node (series 21/ image 5)  --7 mm short axis right paratracheal node (series 21/ image 22)  --10 mm short axis subcarinal node (series 21/ image 26)  Right IJ venous catheter which terminates at the cavoatrial junction.  CT abdomen pelvis will be dictated separately.  Mild degenerative changes of the thoracic spine.  IMPRESSION: Multifocal right lung pneumonia, right lower lobe predominant.  Associated right empyema.  Mild thoracic lymphadenopathy.   Electronically Signed   By: Julian Hy M.D.   On: 05/17/2014 18:14   Ct Abdomen Pelvis W Contrast  05/17/2014   CLINICAL DATA:  HIV with recurrent sepsis. Recurrent pleural effusion on the right. Shortness of breath.  EXAM: CT ABDOMEN AND PELVIS WITH CONTRAST  TECHNIQUE: Multidetector CT imaging of the abdomen and pelvis was performed using  the standard protocol following bolus administration of intravenous contrast.  CONTRAST:  100 cc of Omni 300  COMPARISON:  04/25/2014  FINDINGS: Lower chest: Moderate to large loculated right pleural effusion is identified which contains multiple fluid levels. Enhancing rind of soft tissue overlies the right pleural effusion. There is extensive enhancement of both the parietal and visceral pleural. Small left pleural effusion is noted. Prominent lymph nodes are identified at the right CP angle. The largest measures 1 cm, image 13/ series 201.  Hepatobiliary: The liver has a slightly nodular contour. Low-attenuation structure within the lateral segment of left hepatic lobe measures 5 mm, image 28/series 201. Stone within the gallbladder is identified measuring 9 mm. No biliary dilatation  identified.  Pancreas: Unremarkable appearance of the pancreas.  Spleen: The spleen is normal.  Adrenals/Urinary Tract: The adrenal glands are both normal. Normal appearance of the kidneys. The urinary bladder is collapsed around a Foley catheter balloon.  Stomach/Bowel: The stomach appears normal. There appears to be a defect associated with the anterior wall of the distal stomach or proximal duodenum, image 32/series 201. A surgical drainage catheter is identified in this area. There is no extravasation of contrast material. The mid and distal small bowel loops appear normal. Unremarkable appearance of the colon. A rectal tube is in place. No significant free intraperitoneal air are identified.  Vascular/Lymphatic: Calcified atherosclerotic disease involves the abdominal aorta. The abdominal aorta measures 2.6 cm in maximum AP dimension. There is no retroperitoneal or mesenteric adenopathy.  Reproductive: Prostate gland and seminal vesicles are unremarkable.  Other: No focal fluid collections identified. Ventral abdominal wall wound is identified.  Musculoskeletal: Review of the visualized bony structures is unremarkable.   IMPRESSION: 1. Defect within the ventral wall of the distal stomach/ proximal duodenum is identified and presumably represents patient's ulcer. There is a small amount of air which appears extra luminal. This is directly adjacent to the indwelling surgical drain. No extravasation of contrast material identified however. And there is no significant free intraperitoneal air identified elsewhere in the abdomen or pelvis. 2. Extensively loculated right pleural effusion with abnormal enhancing pleural. Findings may be the sequelae of malignancy or infection. See CT chest report from today. 3. Gallstone 4. Atherosclerosis. Ectatic abdominal aorta at risk for aneurysm development. Recommend followup by [place] in 5 years. This recommendation follows ACR consensus guidelines: White Paper of the ACR Incidental Findings Committee II on Vascular Findings. J Am Coll Radiol 2013; 10:789-794.   Electronically Signed   By: Kerby Moors M.D.   On: 05/17/2014 18:05   Dg Chest Port 1 View  05/17/2014   CLINICAL DATA:  Difficulty breathing  EXAM: PORTABLE CHEST - 1 VIEW  COMPARISON:  May 16, 2014  FINDINGS: Central catheter tip is in the superior vena cava near the cavoatrial junction, stable. A small loculated pneumothorax in the right base remains stable. No new or expanding pneumothorax. Consolidation with loculated effusion on the right persists. There is generalized interstitial prominence,, largely due to underlying interstitial fibrosis. There may be some superimposed interstitial edema. Heart size and pulmonary vascularity are normal. No adenopathy.  IMPRESSION: Persistent loculated pneumothorax right base. Right sided pleural effusion, at least partly loculated. Suspect underlying interstitial fibrosis, although there may be a degree of superimposed interstitial edema. There is consolidation right base, stable. No change in cardiac silhouette. No new opacity.   Electronically Signed   By: Lowella Grip M.D.   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 Acute respiratory failure after surgery >> resolved 12/23. -Bronchial hygiene -Oxygen to keep SpO2 > 92% -Rt loculated effusion will remain as is & does NOT need sampling -Oncology evaluation once he recovers  AKI 2nd to GI bleed (impoved, now worse again Stage 3 CKD. - Monitor renal fx, urine outpt, electrolytes  Bleeding duodenal  ulcer s/p laparotomy 12/21. -Post-op care per CCS -TNA for nutrition >> advance diet per CCS & dc TNA - CVL can then be removed  Acute blood loss anemia 2nd to GI bleed (improved) -F/u CBC -SCD for DVT prevention  PCCM to sign off   Kara Mead MD. Shade Flood. [facility] Pager [phone] If no response call [phone]   05/18/2014 10:16 AM

## 2014-05-18 NOTE — Progress Notes (Signed)
PARENTERAL NUTRITION CONSULT NOTE - FOLLOW UP  Pharmacy Consult for TPN Indication: Prolonged ileus  No Known Allergies  Patient Measurements: Height: 5' 6"  (167.6 cm) Weight: 125 lb 7.1 oz (56.9 kg) IBW/kg (Calculated) : 63.8 Adjusted Body Weight:  Usual Weight:   Vital Signs: Temp: 98 F (36.7 C) (12/30 0327) Temp Source: Oral (12/30 0327) BP: 114/62 mmHg (12/30 0327) Pulse Rate: 87 (12/30 0327) Intake/Output from previous day: 12/29 0701 - 12/30 0700 In: 5450 [P.O.:600; IV Piggyback:2810; TPN:2040] Out: 4942.5 [Urine:4350; Drains:42.5; Stool:550] Intake/Output from this shift:    Labs:  Recent Labs  05/15/14 1500 05/15/14 2055 05/16/14 0300 05/18/14 0445  WBC 13.6*  --  10.2 11.8*  HGB 7.7* 10.2* 10.0* 9.0*  HCT 23.5* 31.9* 31.3* 28.2*  PLT 300  --  227 222     Recent Labs  05/16/14 0300 05/17/14 0425 05/17/14 1150 05/18/14 0445  NA 144 143  --  140  K 4.5 3.4*  --  3.8  CL 114* 118*  --  109  CO2 24 19  --  25  GLUCOSE 131* 94  --  140*  BUN 56* 47*  --  54*  CREATININE 1.66* 1.40*  --  2.04*  CALCIUM 8.0* 6.0*  --  7.5*  MG 2.1  --  2.0  --   PHOS 5.1* 3.9 4.9*  --   PROT 4.6*  --   --   --   ALBUMIN 1.4*  --   --   --   AST 52*  --   --   --   ALT 63*  --   --   --   ALKPHOS 143*  --   --   --   BILITOT 0.4  --   --   --   PREALBUMIN 9.7*  --   --   --   TRIG 132  --   --   --    Estimated Creatinine Clearance: 28.7 mL/min (by C-G formula based on Cr of 2.04).    Recent Labs  05/17/14 1939 05/17/14 2351 05/18/14 0340  GLUCAP 157* 114* 146*    Medications:  Scheduled:  . antiseptic oral rinse  7 mL Mouth Rinse q12n4p  . chlorhexidine  15 mL Mouth Rinse BID  . feeding supplement (RESOURCE BREEZE)  1 Container Oral TID BM  . insulin aspart  0-15 Units Subcutaneous 6 times per day  . levalbuterol  1.25 mg Nebulization Q6H  . micafungin (MYCAMINE) IV  100 mg Intravenous Daily  . pantoprazole (PROTONIX) IV  40 mg Intravenous Q12H   . piperacillin-tazobactam (ZOSYN)  IV  3.375 g Intravenous Q8H  . vancomycin  500 mg Intravenous Q12H    Insulin Requirements in the past 24 hours: 5 units mod SSI; 12 units regular insulin in TPN bag  Nutritional Goals:  1900-2100 kCal, 85-100 grams of protein per day  Current Nutrition:  Clinimix E 5/15 @ 75 ml/hr with 20%lipids @ 12m/hr will provide 90 gm protein and 1758 kcal/day Resource Breeze ordered tid, clear liq diet   Assessment: 68yo man on TPN with serosanguinous drainage from JP, (+)abd distention with open wound.   GI: S/P duodenotomy with primary ligation of bleeding duodenal ulcer on 12/19.UGI shows small contained leak at upper end of duodenal suture line closure  (+)JP drain->clear. Prealbumin 9.7 (low at baseline -likely due to recent surgery). Abd soft.  Start clears  PPI-IV bid  Bilious rectal drainiage  Endo: No hx DM. Moderate SSI and  insulin in TPN, CBG <150  Lytes: K 3.8,12/29>> Phos 3.9, Ca 6, Alb 1.4, CoCa 8.08  Renal: Cr 2.04 , UOP 3.2 ml/kg/hr, I/O (+) ~400 ml  Acute urinary retention requiring foley  Pulm: S/P VATS/decort of empyema with talc pleurodesis.Bx of RLL shows carcinoma Needed venturi mask overnight, RR 23-34  on 12L02     Resumed Abx, SIRS  Cards: BP low-nl, HR ok Hepatobil: Alk phos, AST/ALT elevated, TG 273  Neuro:A&O. Pain controlled  ID: HIV meds remain on hold until eating solid diet  WBC 11.8   AFeb  Best Practices: SCDs  TPN Access: 12/21 R IJ CVC  TPN day#: 12/22 >>  Plan:  - Continue Clinimix E 5/15 at 75 ml/hr and 20% lipids at 16m/hr. This provides 90% of estimated needs. - Continue insulin in TPN (12 units/bag, pt receiving ~11 units) -f/u diet advancement -f/u am labs  Thanks for allowing pharmacy to be a part of this patient's care.  LExcell Seltzer PharmD Clinical Pharmacist, [phone]

## 2014-05-18 NOTE — Progress Notes (Signed)
Utilization review completed.  

## 2014-05-19 ENCOUNTER — Inpatient Hospital Stay (HOSPITAL_COMMUNITY): Payer: Commercial Managed Care - HMO

## 2014-05-19 DIAGNOSIS — G7281 Critical illness myopathy: Secondary | ICD-10-CM

## 2014-05-19 DIAGNOSIS — Z21 Asymptomatic human immunodeficiency virus [HIV] infection status: Secondary | ICD-10-CM

## 2014-05-19 LAB — GLUCOSE, CAPILLARY
Glucose-Capillary: 100 mg/dL — ABNORMAL HIGH (ref 70–99)
Glucose-Capillary: 109 mg/dL — ABNORMAL HIGH (ref 70–99)
Glucose-Capillary: 119 mg/dL — ABNORMAL HIGH (ref 70–99)
Glucose-Capillary: 122 mg/dL — ABNORMAL HIGH (ref 70–99)
Glucose-Capillary: 123 mg/dL — ABNORMAL HIGH (ref 70–99)
Glucose-Capillary: 126 mg/dL — ABNORMAL HIGH (ref 70–99)

## 2014-05-19 LAB — BASIC METABOLIC PANEL
Anion gap: 12 (ref 5–15)
BUN: 58 mg/dL — ABNORMAL HIGH (ref 6–23)
CO2: 26 mmol/L (ref 19–32)
Calcium: 7.7 mg/dL — ABNORMAL LOW (ref 8.4–10.5)
Chloride: 105 mEq/L (ref 96–112)
Creatinine, Ser: 2.76 mg/dL — ABNORMAL HIGH (ref 0.50–1.35)
GFR calc Af Amer: 26 mL/min — ABNORMAL LOW (ref >90)
GFR, EST NON AFRICAN AMERICAN: 22 mL/min — AB (ref >90)
Glucose, Bld: 114 mg/dL — ABNORMAL HIGH (ref 70–99)
Potassium: 3.5 mmol/L (ref 3.5–5.1)
SODIUM: 143 mmol/L (ref 135–145)

## 2014-05-19 LAB — EXPECTORATED SPUTUM ASSESSMENT W REFEX TO RESP CULTURE

## 2014-05-19 LAB — EXPECTORATED SPUTUM ASSESSMENT W GRAM STAIN, RFLX TO RESP C

## 2014-05-19 MED ORDER — LAMIVUDINE 10 MG/ML PO SOLN
100.0000 mg | Freq: Every day | ORAL | Status: DC
  Administered 2014-05-19 – 2014-05-23 (×5): 100 mg via ORAL
  Filled 2014-05-19 (×5): qty 10

## 2014-05-19 MED ORDER — ZIDOVUDINE 100 MG PO CAPS
300.0000 mg | ORAL_CAPSULE | Freq: Two times a day (BID) | ORAL | Status: DC
  Administered 2014-05-19 – 2014-05-23 (×8): 300 mg via ORAL
  Filled 2014-05-19 (×9): qty 3

## 2014-05-19 MED ORDER — CEFUROXIME AXETIL 500 MG PO TABS
500.0000 mg | ORAL_TABLET | Freq: Two times a day (BID) | ORAL | Status: DC
  Filled 2014-05-19 (×2): qty 1

## 2014-05-19 MED ORDER — CEFUROXIME AXETIL 500 MG PO TABS
500.0000 mg | ORAL_TABLET | Freq: Every day | ORAL | Status: AC
  Administered 2014-05-19 – 2014-05-22 (×4): 500 mg via ORAL
  Filled 2014-05-19 (×4): qty 1

## 2014-05-19 MED ORDER — DARUNAVIR-COBICISTAT 800-150 MG PO TABS
1.0000 | ORAL_TABLET | Freq: Every day | ORAL | Status: DC
Start: 2014-05-19 — End: 2014-05-23
  Administered 2014-05-19 – 2014-05-23 (×5): 1 via ORAL
  Filled 2014-05-19 (×5): qty 1

## 2014-05-19 NOTE — Progress Notes (Signed)
Physical Therapy Treatment Patient Details Name: [name] [name] MRN: [id number] DOB: May 26, 1945 Today's Date: 05/19/2014    History of Present Illness      PT Comments    Mobility limited by fatigue today.  Just prior to PT Rx, he transferred from 3[facility] to [place].  Follow Up Recommendations  CIR     Equipment Recommendations       Recommendations for Other Services Rehab consult     Precautions / Restrictions Precautions Precautions: Fall;Other (comment) Precaution Comments: wound vac    Mobility  Bed Mobility         Supine to sit: Supervision Sit to supine: Supervision   General bed mobility comments: increased time, use of bed rail  Transfers Overall transfer level: Needs assistance Equipment used: Rolling walker (2 wheeled) Transfers: Sit to/from [facility] Sit to Stand: Min guard Stand pivot transfers: Min guard       General transfer comment: vc's for safety, assist with multi lines  Ambulation/Gait Ambulation/Gait assistance: Min assist Ambulation Distance (Feet): 20 Feet Assistive device: Rolling walker (2 wheeled) Gait Pattern/deviations: Step-through pattern;Decreased stride length Gait velocity: decreased   General Gait Details: Pt abruptly returned to EOB, stating he couldn't walk any further today.  He reports he sat up in chair this AM prior to transfer from 3S and now wishes to return to bed.   Stairs            Wheelchair Mobility    Modified Rankin (Stroke Patients Only)       Balance                                    Cognition Arousal/Alertness: Awake/alert Behavior During Therapy: WFL for tasks assessed/performed Overall Cognitive Status: Within Functional Limits for tasks assessed                      Exercises      General Comments        Pertinent Vitals/Pain Pain Assessment: No/denies pain    Home Living                      Prior Function            PT  Goals (current goals can now be found in the care plan section) Progress towards PT goals: Progressing toward goals    Frequency  Min 3X/week    PT Plan Current plan remains appropriate    Co-evaluation             End of Session Equipment Utilized During Treatment: Gait belt;Oxygen Activity Tolerance: Patient limited by fatigue Patient left: in bed;with call bell/phone within reach (bed alarm not working, RN notified and ordering new bed)     Time: 8127-5170 PT Time Calculation (min) (ACUTE ONLY): 32 min  Charges:  $Gait Training: 23-37 mins                    G Codes:      Lorriane Shire 05/19/2014, 2:52 PM

## 2014-05-19 NOTE — Progress Notes (Signed)
Patient ID: [name] [name], male   DOB: Jul 16, 1945, 68 y.o.   MRN: [id number]   LOS: 21 days   Subjective: Doing ok, did well with full liquid diet yesterday. Denies N/V.   Objective: Vital signs in last 24 hours: Temp:  [98.4 F (36.9 C)-100 F (37.8 C)] 98.8 F (37.1 C) (12/31 0714) Pulse Rate:  [75-109] 87 (12/31 0714) Resp:  [18-39] 21 (12/31 0714) BP: (90-106)/(47-66) 96/56 mmHg (12/31 0714) SpO2:  [81 %-96 %] 81 % (12/31 0714) Last BM Date: 05/18/14 (FlexiSeal)   Physical Exam General appearance: alert and no distress Resp: diminished breath sounds RML and RUL Cardio: regular rate and rhythm GI: Soft, +BS, incision clean and granulating   Assessment/Plan: Acute bleeding duodenal ulcer POD #10 s/p Ex Lap, duodenotomy, primary ligation bleeding duodenal ulcer Respiratory failure Elevated creatinine - 2.04 Leukocytosis - 11.8 S/p recent VATS showing carcinoma HIV Probable PNA  Plan: 1. CT 12/29 showed normal post op changes and resolution of contained leak, right pleural effusion concerning for infectious/malignant. TCTS does not plan on any further operations or aspirations of the right pleural effusion per Dr. Vivi Martens note. He needs Oncology evaluation at discharge. 2. Advance diet to regular 3. Ambulate and IS 4. SCD's and consider resuming heparin and closely monitoring H/H 5. Protonix 6. IV antibiotics for probable pneumonia 7. WOC consult for VAC to midline incision    [name], PA-C Pager: [phone] 05/19/2014

## 2014-05-19 NOTE — Progress Notes (Signed)
Rehab admissions - I met with pt in follow up to rehab MD consult to explain the possibility of inpatient rehab. Questions were answered and informational brochures were given. Pt shared that his niece Sharyn Lull and her husband are his primary supports and that they both work. I explained that rehab MD has projected that pt would need supervision and possible minimal assistance at the end of a rehab stay. Pt was not sure who could provide this help.  During my discussion with pt, he fell asleep twice and his breathing very briefly stopped as well. Pt did arouse with gentle stimulation of his foot. I alerted charge RN who was already aware of this issue.   I then called pt's niece to discuss the possibility of inpatient rehab. She was very supportive of pt but did confirm that neither she or her husband could provide supervision or minimal help to pt as they both work. I explained to both pt/niece that we would need insurance authorization from [facility] to consider possible rehab. I also explained that rehab MD is projecting that pt would need supervision and minimal assistance at the end of a rehab stay.  I also explained that if pt does not have adequate social supports at home, that skilled nursing may need to be pursued. Niece shared that [facility] could be an option as they had a family member there. Pt and Niece were agreeable to considering SNF if that is necessary.  Per rehab MD, pt is not medically ready for inpatient rehab at this time. Per Dr. Naaman Plummer, "Mr. Cybulski is not medically ready of functionally ready for CIR. Will follow along for progress. Ultimately would highly benefit from our program due to his profound weakness."  I will check on pt's status on Monday and proceed accordingly.  Please call me with any questions. Thanks.  Nanetta Batty, PT Rehabilitation Admissions Coordinator [phone]

## 2014-05-19 NOTE — Progress Notes (Addendum)
[facility] PROGRESS NOTE Interim History: 68 year old with history of HIV who presented to the hospital complaining of right upper quadrant abdominal pain and was found to have a large right pleural effusion with right lower lobe mass. Cardiothoracic surgeon consulted and patient is status post VATS with pleurodesis. 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 During his hospital stay patient was also found to have duodenal ulcer which Gen. surgery was consulted and patient is status post exploratory laparotomy with Duodenotomy with primary ligation   Assessment/Plan:  Upper GI bleed/ hemorrhagic shock: - Status post emergent laparotomy on 05/09/2014 with ligation of duodenal ulcer. General surgery on board recommended to initiate diet. - Status post multiple transfusions. - Started on TNA no off, has been tolerating diet   Sepsis/Sirs/New Acute respiratory failure on 12.29.2015 due to pneumonia and empyema: - Lactic acid 1.6. CT scan of the CHest as below, CT surgery The loculated right effusion is a persistent space from trapped lung that will remain as it is. - blood cultures are negative. - Currently on HIV medications.  -Started empirically on vancomycin, Zosyn and micafungin on 05/17/2014 per ID. pro calcitonin < 2.0. - has remained afebrile. - d/c central line after antibiotics and place peripheral in the afternoon. - Good UOP. Urine is clear. D/c foley.   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 in the setting of hemorrhagic shock/Hyperkalemia: - Initially due to hemorrhagic shock which is resolved. - Monitor renal fx, urine outpt, electrolytes - Monitor strict I's and O's. Received IV lasix. - baseline Cr 1.6-2.2  Bleeding duodenal ulcer  s/p laparotomy 12/21. -Post-op care per [facility] -TNA for nutrition, surgery has recommended to advance diet.   Protein-calorie malnutrition, severe - Tolerating diet orally.  Human immunodeficiency virus (HIV) disease - Continue HIV medications.    Code Status: presumed full Family Communication: None at bedside Disposition Plan: Pending improvement in condition   Consultants:  CT surgery  Gen. Surgery  PCCM  Procedures: CT abdomen and pelvis on 05/17/2014: Defect within the ventral wall of the distal stomach/ proximal duodenum is identified and presumably represents patient's ulcer.  No extravasation of contrast material identified however. And there is no significant free intraperitoneal air identified elsewhere in the abdomen or pelvis.  Extensively loculated right pleural effusion with abnormal enhancing pleural. Findings may be the sequelae of malignancy or infection.  CT chest without contrast on 05/08/2014: Multifocal right lung pneumonia, right lower lobe predominant. Right empyema 12/10 abdominal ultrasound> 13 mm gallstone, mild tenderness over gallbladder without gallbladder thickening, possible left upper pole renal calculus, right pleural effusion noted 12/10 renal ultrasound> no hydronephrosis 12/11 HIDA scan> patent cystic duct without evidence of acute cholecystitis 12/11 Echo> LVEF 65%, RV normal. 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 JP drain placement   Antibiotics:  Vancomycin Zosyn and micafungin started on 05/17/2014  HPI/Subjective: No compalins  Objective: Filed Vitals:   05/19/14 0015 05/19/14 0245 05/19/14 0400 05/19/14 0714  BP: 90/54  100/47 96/56  Pulse: 108 109 102 47  Temp: 99.6 F (37.6 C)  99 F (37.2 C) 98.8 F  (37.1 C)  TempSrc: Oral  Oral Oral  Resp: 18 26 23 21   Height:      Weight:      SpO2: 92% 90% 96% 81%    Intake/Output Summary (Last 24 hours) at 05/19/14 0824 Last data filed at 05/19/14 0700  Gross per 24 hour  Intake   2100 ml  Output   4500 ml  Net  -2400 ml   Filed Weights   05/03/14 2205 05/05/14 1830 05/09/14 0530  Weight: 56.836 kg (125 lb 4.8 oz) 55.5 kg (122 lb 5.7 oz) 56.9 kg (125 lb 7.1 oz)    Exam:  General: Alert, awake, oriented x3, in no acute distress.  HEENT: No bruits, no goiter.  Heart: Regular rate and rhythm. Lungs: Good air movement, wheezing B/l Abdomen: Soft, nontender, nondistended, positive bowel sounds.     Data Reviewed: Basic Metabolic Panel:  Recent Labs Lab 05/14/14 0420 05/15/14 0320 05/16/14 0300 05/17/14 0425 05/17/14 1150 05/18/14 0445  NA 145 149* 144 143  --  140  K 3.7 3.3* 4.5 3.4*  --  3.8  CL 114* 122* 114* 118*  --  109  CO2 23 21 24 19   --  25  GLUCOSE 102* 107* 131* 94  --  140*  BUN 53* 48* 56* 47*  --  54*  CREATININE 1.98* 1.49* 1.66* 1.40*  --  2.04*  CALCIUM 8.1* 6.7* 8.0* 6.0*  --  7.5*  MG  --   --  2.1  --  2.0  --   PHOS  --   --  5.1* 3.9 4.9*  --    Liver Function Tests:  Recent Labs Lab 05/16/14 0300  AST 52*  ALT 63*  ALKPHOS 143*  BILITOT 0.4  PROT 4.6*  ALBUMIN 1.4*   No results for input(s): LIPASE, AMYLASE in the last 168 hours. No results for input(s): AMMONIA in the last 168 hours. CBC:  Recent Labs Lab 05/14/14 1840 05/15/14 0300 05/15/14 1500 05/15/14 2055 05/16/14 0300 05/18/14 0445  WBC 9.4 9.0 13.6*  --  10.2 11.8*  NEUTROABS  --   --   --   --  7.9* 9.9*  HGB 11.2* 9.2* 7.7* 10.2* 10.0* 9.0*  HCT 33.6* 27.7* 23.5* 31.9* 31.3* 28.2*  MCV 92.1 93.3 94.4  --  94.6 94.3  PLT 251 218 300  --  227 222   Cardiac Enzymes: No results for input(s): CKTOTAL, CKMB, CKMBINDEX, TROPONINI in the last 168 hours. BNP (last 3 results)  Recent Labs  04/21/14 1131  PROBNP 25.0    CBG:  Recent Labs Lab 05/18/14 1516 05/18/14 2057 05/19/14 0015 05/19/14 0357 05/19/14 0723  GLUCAP 119* 123* 122* 109* 100*    Recent Results (from the past 240 hour(s))  Culture, blood (routine x 2)     Status: None (Preliminary result)   Collection Time: 05/17/14 11:40 AM  Result Value Ref Range Status   Specimen Description BLOOD LEFT ANTECUBITAL  Final   Special Requests BOTTLES DRAWN AEROBIC AND ANAEROBIC 10CC  Final   Culture   Final           BLOOD CULTURE RECEIVED NO GROWTH TO DATE CULTURE WILL BE HELD FOR 5 DAYS BEFORE ISSUING A FINAL NEGATIVE REPORT Performed at [facility]    Report Status PENDING  Incomplete  Culture, blood (routine x 2)     Status: None (Preliminary result)   Collection Time: 05/17/14 11:50 AM  Result Value Ref Range Status   Specimen Description BLOOD RIGHT ANTECUBITAL  Final  Special Requests BOTTLES DRAWN AEROBIC AND ANAEROBIC [place]  Final   Culture   Final           BLOOD CULTURE RECEIVED NO GROWTH TO DATE CULTURE WILL BE HELD FOR 5 DAYS BEFORE ISSUING A FINAL NEGATIVE REPORT Performed at [facility]    Report Status PENDING  Incomplete  Culture, expectorated sputum-assessment     Status: None   Collection Time: 05/18/14  2:03 PM  Result Value Ref Range Status   Specimen Description SPUTUM  Final   Special Requests Normal  Final   Sputum evaluation   Final    MICROSCOPIC FINDINGS SUGGEST THAT THIS SPECIMEN IS NOT REPRESENTATIVE OF LOWER RESPIRATORY SECRETIONS. PLEASE RECOLLECT. Notified Rock,R RN @ [phone] 05/18/14 Leonard,A    Report Status 05/18/2014 FINAL  Final  Culture, expectorated sputum-assessment     Status: None   Collection Time: 05/18/14  4:25 PM  Result Value Ref Range Status   Specimen Description SPUTUM  Final   Special Requests NONE  Final   Sputum evaluation   Final    MICROSCOPIC FINDINGS SUGGEST THAT THIS SPECIMEN IS NOT REPRESENTATIVE OF LOWER RESPIRATORY SECRETIONS. PLEASE RECOLLECT. CALLED TO  R.ROCK,RN AT [phone] BY L.PITT 05/18/14    Report Status 05/18/2014 FINAL  Final  Culture, expectorated sputum-assessment     Status: None   Collection Time: 05/19/14  4:27 AM  Result Value Ref Range Status   Specimen Description SPUTUM  Final   Special Requests NONE  Final   Sputum evaluation   Final    THIS SPECIMEN IS ACCEPTABLE. RESPIRATORY CULTURE REPORT TO FOLLOW.   Report Status 05/19/2014 FINAL  Final     Studies: Ct Chest Wo Contrast  05/18/2014   ADDENDUM REPORT: 05/18/2014 16:13  ADDENDUM: On additional review of the medical record, the patient is status post VATS with decortication and talc pleurodesis.  As such, at least some of the complex pleural effusion with gas likely reflects a postsurgical appearance rather than empyema.  Additionally, this accounts for some of the pleural thickening. However, some of the mass-like appearance within the right lower lobe consolidation corresponds to known tumor at the time of intraoperative biopsy.  This remains suboptimally evaluated in the absence of intravenous contrast administration, but is incompletely visualized on the enhanced CT abdomen/pelvis.  REVISED IMPRESSION:  Multifocal right lung pneumonia, right lower lobe predominant.  Underlying tumor in the right lower lobe, poorly evaluated in the absence of intravenous contrast. Mild thoracic lymphadenopathy, as above.  Moderate complex right pleural effusion with gas and associated pleural thickening, most of which is likely postsurgical.   Electronically Signed   By: Julian Hy M.D.   On: 05/18/2014 16:13   05/18/2014   CLINICAL DATA:  Sepsis, recurrent right pleural effusion, shortness of breath, hypoxia. HIV.  EXAM: CT CHEST WITHOUT CONTRAST  TECHNIQUE: Multidetector CT imaging of the chest was performed following the standard protocol without IV contrast.  COMPARISON:  Chest radiograph dated 05/17/2014  FINDINGS: Evaluation is constrained by lack of intravenous contrast  administration. The right lung base is partially imaged on separate enhanced CT abdomen/ pelvis.  Multifocal patchy opacities in the right lung, predominantly in the right middle and right lower lobe. Dense right lower lobe consolidation, most of which enhances following contrast administration. Associated pleural thickening.  Complex right pleural effusion with gas, reflecting an empyema. Trace left pleural effusion.  Underlying moderate to severe centrilobular and paraseptal emphysematous changes. No pneumothorax.  Visualized thyroid is unremarkable.  The  heart is top-normal in size. Trace pericardial fluid. Mild atherosclerotic calcifications of the aortic arch.  Small thoracic lymph nodes, including:  --9 mm short axis right supraclavicular node (series 21/ image 5)  --7 mm short axis right paratracheal node (series 21/ image 22)  --10 mm short axis subcarinal node (series 21/ image 26)  Right IJ venous catheter which terminates at the cavoatrial junction.  CT abdomen pelvis will be dictated separately.  Mild degenerative changes of the thoracic spine.  IMPRESSION: Multifocal right lung pneumonia, right lower lobe predominant.  Associated right empyema.  Mild thoracic lymphadenopathy.  Electronically Signed: By: Julian Hy M.D. On: 05/17/2014 18:14   Ct Abdomen Pelvis W Contrast  05/17/2014   CLINICAL DATA:  HIV with recurrent sepsis. Recurrent pleural effusion on the right. Shortness of breath.  EXAM: CT ABDOMEN AND PELVIS WITH CONTRAST  TECHNIQUE: Multidetector CT imaging of the abdomen and pelvis was performed using the standard protocol following bolus administration of intravenous contrast.  CONTRAST:  100 cc of Omni 300  COMPARISON:  04/25/2014  FINDINGS: Lower chest: Moderate to large loculated right pleural effusion is identified which contains multiple fluid levels. Enhancing rind of soft tissue overlies the right pleural effusion. There is extensive enhancement of both the parietal and  visceral pleural. Small left pleural effusion is noted. Prominent lymph nodes are identified at the right CP angle. The largest measures 1 cm, image 13/ series 201.  Hepatobiliary: The liver has a slightly nodular contour. Low-attenuation structure within the lateral segment of left hepatic lobe measures 5 mm, image 28/series 201. Stone within the gallbladder is identified measuring 9 mm. No biliary dilatation identified.  Pancreas: Unremarkable appearance of the pancreas.  Spleen: The spleen is normal.  Adrenals/Urinary Tract: The adrenal glands are both normal. Normal appearance of the kidneys. The urinary bladder is collapsed around a Foley catheter balloon.  Stomach/Bowel: The stomach appears normal. There appears to be a defect associated with the anterior wall of the distal stomach or proximal duodenum, image 32/series 201. A surgical drainage catheter is identified in this area. There is no extravasation of contrast material. The mid and distal small bowel loops appear normal. Unremarkable appearance of the colon. A rectal tube is in place. No significant free intraperitoneal air are identified.  Vascular/Lymphatic: Calcified atherosclerotic disease involves the abdominal aorta. The abdominal aorta measures 2.6 cm in maximum AP dimension. There is no retroperitoneal or mesenteric adenopathy.  Reproductive: Prostate gland and seminal vesicles are unremarkable.  Other: No focal fluid collections identified. Ventral abdominal wall wound is identified.  Musculoskeletal: Review of the visualized bony structures is unremarkable.  IMPRESSION: 1. Defect within the ventral wall of the distal stomach/ proximal duodenum is identified and presumably represents patient's ulcer. There is a small amount of air which appears extra luminal. This is directly adjacent to the indwelling surgical drain. No extravasation of contrast material identified however. And there is no significant free intraperitoneal air identified  elsewhere in the abdomen or pelvis. 2. Extensively loculated right pleural effusion with abnormal enhancing pleural. Findings may be the sequelae of malignancy or infection. See CT chest report from today. 3. Gallstone 4. Atherosclerosis. Ectatic abdominal aorta at risk for aneurysm development. Recommend followup by [place] in 5 years. This recommendation follows ACR consensus guidelines: White Paper of the ACR Incidental Findings Committee II on Vascular Findings. J Am Coll Radiol 2013; 10:789-794.   Electronically Signed   By: Kerby Moors M.D.   On: 05/17/2014 18:05   Dg  Chest Port 1 View  05/19/2014   CLINICAL DATA:  Pleural effusion and loculated pneumothorax  EXAM: PORTABLE CHEST - 1 VIEW  COMPARISON:  Chest radiograph and chest CT May 17, 2014  FINDINGS: Central catheter tip is at the cavoatrial junction. There is evidence of empyema with fluid and air in the right base, essentially stable. There is widespread infiltrate throughout the right lung superimposed on emphysematous change and bronchiectasis. On the left, there is underlying emphysema with atelectatic change in the left base. There is a small left effusion. Heart is upper normal in size with pulmonary vascularity within normal limits. The adenopathy seen on CT is not well seen by radiography. No bone lesions.  IMPRESSION: Persistent empyema on the right with extensive airspace consolidation and loculated effusion, unchanged. Atelectatic change left base with small left effusion. Underlying emphysema. No change in cardiac silhouette.  Note that the air in the right base region appear stable. There may be some loculated hydropneumothorax in this area, although all of this air may be secondary to empyema.   Electronically Signed   By: Lowella Grip M.D.   On: 05/19/2014 08:00    Scheduled Meds: . antiseptic oral rinse  7 mL Mouth Rinse q12n4p  . chlorhexidine  15 mL Mouth Rinse BID  . feeding supplement (RESOURCE BREEZE)  1 Container  Oral TID BM  . insulin aspart  0-15 Units Subcutaneous 6 times per day  . levalbuterol  1.25 mg Nebulization Q6H  . micafungin (MYCAMINE) IV  100 mg Intravenous Daily  . pantoprazole (PROTONIX) IV  40 mg Intravenous Q12H  . piperacillin-tazobactam (ZOSYN)  IV  3.375 g Intravenous Q8H  . vancomycin  500 mg Intravenous Q24H   Continuous Infusions:     Charlynne Cousins  [facility] Pager [phone].  If 8PM-8AM, please contact night-coverage at [email or web address], password [facility] 05/19/2014, 8:24 AM  LOS: 21 days

## 2014-05-19 NOTE — Progress Notes (Signed)
Medicare Important Message given? YES  (If response is "NO", the following Medicare IM given date fields will be blank)  Date Medicare IM given: 05/19/14 Medicare IM given by:  Shaan Rhoads  

## 2014-05-19 NOTE — Progress Notes (Addendum)
[facility]    Date of Admission:  04/28/2014   Total days of antibiotics 11        Day 3 vanco        Day 3 piptazo           ID: [name] [name] is a 68 y.o. 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 Now receiving TPN for nutrition. ART on hold until he can take food by mouth. Concern for SIRS  Principal Problem:   Acute kidney injury Active Problems:   Human immunodeficiency virus (HIV) disease   Renal failure   Hyperkalemia   Recurrent pleural effusion on right   Macrocytic anemia   Gallbladder stone without cholecystitis or obstruction   AKI (acute kidney injury)   Protein-calorie malnutrition, severe   SOB (shortness of breath)   Shock   Acute respiratory failure with hypoxia   Upper GI bleed   Abdominal pain   Sepsis   Lung cancer    Subjective: Remains afebrile, started to have his first meal doing well thus far.  Medications:  . antiseptic oral rinse  7 mL Mouth Rinse q12n4p  . chlorhexidine  15 mL Mouth Rinse BID  . feeding supplement (RESOURCE BREEZE)  1 Container Oral TID BM  . insulin aspart  0-15 Units Subcutaneous 6 times per day  . levalbuterol  1.25 mg Nebulization Q6H  . pantoprazole (PROTONIX) IV  40 mg Intravenous Q12H  . piperacillin-tazobactam (ZOSYN)  IV  3.375 g Intravenous Q8H    Objective: Vital signs in last 24 hours: Temp:  [98.6 F (37 C)-100 F (37.8 C)] 98.6 F (37 C) (12/31 1136) Pulse Rate:  [75-109] 100 (12/31 1136) Resp:  [18-29] 18 (12/31 1136) BP: (90-106)/(47-56) 91/50 mmHg (12/31 1136) SpO2:  [90 %-96 %] 95 % (12/31 1136) Constitutional: He is oriented to person, place, and time. He appears undernourished, underweight. In NAD, but wearing nasal cannula with tachypnea HENT: supple, IJ removed Mouth/Throat: Oropharynx  is clear and moist. No oropharyngeal exudate. Dry oral pharynx Cardiovascular: Normal rate, regular rhythm and normal heart sounds. Exam reveals no gallop and no friction rub.  No murmur heard.  Pulmonary/Chest: course breath sounds R>L. No acc muscle use or tachypnea. No respiratory distress. He has no wheezes.  Abdominal: Soft. Bowel sounds are normal. He exhibits no distension. There is no tenderness. RLQ drain in place. Open abdominal wound, clean wound site with Abdominal bandage from surgery Neurological: He is alert and oriented to person, place, and time.  Skin: Skin is warm and dry. No rash noted. No erythema.  Psychiatric: He has a normal mood and affect. His behavior is normal.  Lab Results  Recent Labs  05/18/14 0445 05/19/14 1403  WBC 11.8*  --   HGB 9.0*  --   HCT 28.2*  --   NA 140 143  K 3.8 3.5  CL 109 105  CO2 25 26  BUN 54* 58*  CREATININE 2.04* 2.76*    Microbiology: 12/29 blood cx ngtd 12/30 sputum insufficient specimen Studies/Results: Ct Chest Wo Contrast  05/18/2014   ADDENDUM REPORT: 05/18/2014 16:13  ADDENDUM: On additional review of the medical record, the patient is status post VATS with decortication and talc pleurodesis.  As such, at least some of the complex pleural effusion with gas likely reflects a postsurgical appearance rather than empyema.  Additionally, this accounts for some of the pleural thickening. However, some of the mass-like appearance within the right lower lobe consolidation corresponds to known tumor at the time of intraoperative biopsy.  This remains suboptimally evaluated in the absence of intravenous contrast administration, but is incompletely visualized on the enhanced CT abdomen/pelvis.  REVISED IMPRESSION:  Multifocal right lung pneumonia, right lower lobe predominant.  Underlying tumor in the right lower lobe, poorly evaluated in the absence of intravenous contrast. Mild thoracic lymphadenopathy, as above.  Moderate complex  right pleural effusion with gas and associated pleural thickening, most of which is likely postsurgical.   Electronically Signed   By: Julian Hy M.D.   On: 05/18/2014 16:13   05/18/2014   CLINICAL DATA:  Sepsis, recurrent right pleural effusion, shortness of breath, hypoxia. HIV.  EXAM: CT CHEST WITHOUT CONTRAST  TECHNIQUE: Multidetector CT imaging of the chest was performed following the standard protocol without IV contrast.  COMPARISON:  Chest radiograph dated 05/17/2014  FINDINGS: Evaluation is constrained by lack of intravenous contrast administration. The right lung base is partially imaged on separate enhanced CT abdomen/ pelvis.  Multifocal patchy opacities in the right lung, predominantly in the right middle and right lower lobe. Dense right lower lobe consolidation, most of which enhances following contrast administration. Associated pleural thickening.  Complex right pleural effusion with gas, reflecting an empyema. Trace left pleural effusion.  Underlying moderate to severe centrilobular and paraseptal emphysematous changes. No pneumothorax.  Visualized thyroid is unremarkable.  The heart is top-normal in size. Trace pericardial fluid. Mild atherosclerotic calcifications of the aortic arch.  Small thoracic lymph nodes, including:  --9 mm short axis right supraclavicular node (series 21/ image 5)  --7 mm short axis right paratracheal node (series 21/ image 22)  --10 mm short axis subcarinal node (series 21/ image 26)  Right IJ venous catheter which terminates at the cavoatrial junction.  CT abdomen pelvis will be dictated separately.  Mild degenerative changes of the thoracic spine.  IMPRESSION: Multifocal right lung pneumonia, right lower lobe predominant.  Associated right empyema.  Mild thoracic lymphadenopathy.  Electronically Signed: By: Julian Hy M.D. On: 05/17/2014 18:14   Ct Abdomen Pelvis W Contrast  05/17/2014   CLINICAL DATA:  HIV with recurrent sepsis. Recurrent pleural  effusion on the right. Shortness of breath.  EXAM: CT ABDOMEN AND PELVIS WITH CONTRAST  TECHNIQUE: Multidetector CT imaging of the abdomen and pelvis was performed using the standard protocol following bolus administration of intravenous contrast.  CONTRAST:  100 cc of Omni 300  COMPARISON:  04/25/2014  FINDINGS: Lower chest: Moderate to large loculated right pleural effusion is identified which contains multiple fluid levels. Enhancing rind of soft tissue overlies the right pleural effusion. There is extensive enhancement of both the parietal and visceral pleural. Small left pleural effusion is noted. Prominent lymph nodes are identified at the right CP angle. The largest measures 1 cm, image 13/ series 201.  Hepatobiliary: The liver has a slightly nodular contour. Low-attenuation structure within the lateral segment of left hepatic lobe measures 5 mm, image 28/series 201. Stone within the gallbladder is identified measuring 9 mm. No biliary dilatation identified.  Pancreas: Unremarkable appearance of the pancreas.  Spleen: The spleen is normal.  Adrenals/Urinary Tract: The adrenal glands are both normal. Normal appearance of the kidneys. The urinary bladder is collapsed around a Foley catheter balloon.  Stomach/Bowel: The stomach appears normal. There appears to be a defect associated with the anterior wall of the distal stomach or  proximal duodenum, image 32/series 201. A surgical drainage catheter is identified in this area. There is no extravasation of contrast material. The mid and distal small bowel loops appear normal. Unremarkable appearance of the colon. A rectal tube is in place. No significant free intraperitoneal air are identified.  Vascular/Lymphatic: Calcified atherosclerotic disease involves the abdominal aorta. The abdominal aorta measures 2.6 cm in maximum AP dimension. There is no retroperitoneal or mesenteric adenopathy.  Reproductive: Prostate gland and seminal vesicles are unremarkable.   Other: No focal fluid collections identified. Ventral abdominal wall wound is identified.  Musculoskeletal: Review of the visualized bony structures is unremarkable.  IMPRESSION: 1. Defect within the ventral wall of the distal stomach/ proximal duodenum is identified and presumably represents patient's ulcer. There is a small amount of air which appears extra luminal. This is directly adjacent to the indwelling surgical drain. No extravasation of contrast material identified however. And there is no significant free intraperitoneal air identified elsewhere in the abdomen or pelvis. 2. Extensively loculated right pleural effusion with abnormal enhancing pleural. Findings may be the sequelae of malignancy or infection. See CT chest report from today. 3. Gallstone 4. Atherosclerosis. Ectatic abdominal aorta at risk for aneurysm development. Recommend followup by [place] in 5 years. This recommendation follows ACR consensus guidelines: White Paper of the ACR Incidental Findings Committee II on Vascular Findings. J Am Coll Radiol 2013; 10:789-794.   Electronically Signed   By: Kerby Moors M.D.   On: 05/17/2014 18:05   Dg Chest Port 1 View  05/19/2014   CLINICAL DATA:  Pleural effusion and loculated pneumothorax  EXAM: PORTABLE CHEST - 1 VIEW  COMPARISON:  Chest radiograph and chest CT May 17, 2014  FINDINGS: Central catheter tip is at the cavoatrial junction. There is evidence of empyema with fluid and air in the right base, essentially stable. There is widespread infiltrate throughout the right lung superimposed on emphysematous change and bronchiectasis. On the left, there is underlying emphysema with atelectatic change in the left base. There is a small left effusion. Heart is upper normal in size with pulmonary vascularity within normal limits. The adenopathy seen on CT is not well seen by radiography. No bone lesions.  IMPRESSION: Persistent empyema on the right with extensive airspace consolidation and  loculated effusion, unchanged. Atelectatic change left base with small left effusion. Underlying emphysema. No change in cardiac silhouette.  Note that the air in the right base region appear stable. There may be some loculated hydropneumothorax in this area, although all of this air may be secondary to empyema.   Electronically Signed   By: Lowella Grip M.D.   On: 05/19/2014 08:00     Assessment/Plan: hcap = appears improving. cxr is difficult to tell if loculated fluid is infection vs. Due malignancy. He does appear clinically better. unfortuantely unable to get good respiratory specimen. Will change him to oral abtx to treat hcap with cefuroxime 500mg  BID x 4 additional days.  HIV = please restart his home regimen of darunavir/cobi, plus lamivudine 100mg  daily plus zidovudine 600mg  daily  AKI = he had big increase in creatinine in the last 24hr reflective of switching possibly to orals and less IVF. Consider bolus and ensure not worsening pre-renal azotemia  Jude Naclerio, [facility] for Infectious Diseases Cell: [phone] Pager: [phone]  05/19/2014, 3:50 PM

## 2014-05-19 NOTE — Clinical Social Work Psychosocial (Signed)
Clinical Social Work Department BRIEF PSYCHOSOCIAL ASSESSMENT 05/19/2014  Patient:  [name] [name], [name] [name]     Account Number:  [id number]     Admit date:  04/28/2014  Clinical Social Worker:  Domenica Reamer, [place]  Date/Time:  05/19/2014 03:22 PM  Referred by:  Physician  Date Referred:  05/19/2014 Referred for  SNF Placement   Other Referral:   Interview type:  Patient Other interview type:    PSYCHOSOCIAL DATA Living Status:  ALONE Admitted from facility:   Level of care:   Primary support name:  Greg Cutter Primary support relationship to patient:  FAMILY Degree of support available:   high level of support from niece    CURRENT CONCERNS Current Concerns  Post-Acute Placement   Other Concerns:    SOCIAL WORK ASSESSMENT / PLAN CSW spoke with patient concerning SNF backup to CIR. Patient is agreeable.  Reports never having been to rehab but states that mom went to rehab at [facility] and was treated well. CSW will continue to follow.   Assessment/plan status:  Psychosocial Support/Ongoing Assessment of Needs Other assessment/ plan:   FL2  PASAR   Information/referral to community resources:    PATIENT'S/FAMILY'S RESPONSE TO PLAN OF CARE: Patient is agreeable to rehab at SNF and hopes to be able to leave hospital soon.       Domenica Reamer, [place] Social Worker [phone]

## 2014-05-19 NOTE — Consult Note (Signed)
WOC wound consult note Reason for Consult: placement of NPWT VAC dressing to the midline surgical wound Wound type: surgical  Measurement: 13cm x 4cm x 3cm  Wound bed: subcutaneous tissue, clean, moist Drainage (amount, consistency, odor) minimal, serosanguinous  Periwound: intact  Dressing procedure/placement/frequency: 1pc of black granufoam used to fill the midline wound, draped and sealed at 155mmHG. Pt tolerated well. Ok for bedside nurses to assume care of NPWT VAC dressing T/Th/Sat  Discussed POC with patient and bedside nurse.  Re consult if needed, will not follow at this time. Thanks  Caylor Cerino [place], [place] [phone])

## 2014-05-19 NOTE — Progress Notes (Signed)
Paged Dr Aileen Fass concerning instructions to give zosyn in central line. Says it is okay to give Zosyn PIV and D/C central line.

## 2014-05-19 NOTE — Progress Notes (Signed)
Occupational Therapy Treatment Patient Details Name: [name] [name] MRN: [id number] DOB: 1946/02/03 Today's Date: 05/19/2014    History of present illness 69 y/o male with HIV followed by Dr. [name] for an exudative effusion was admitted on 12/10 for pain in his abdomen and recurrence of the pleural effusion which had been sampled just three days prior. s/p VATS for lung biopsy 12/17. s/p EXPLORATORY LAPAROTOMY and DUODENOTOMY with Primary ligation Bleeding Duodenal Ulcer  12/21.   OT comments  Pt reports fatigue from being up in chair earlier and working with PT.  Agreeable to activities at EOB.  Focused on grooming and upper body dressing.  Pt now on a regular diet and able to self feed. Wanted to avoid moving around too much and upsetting stomach.  Follow Up Recommendations  CIR    Equipment Recommendations       Recommendations for Other Services      Precautions / Restrictions Precautions Precautions: Fall;Other (comment) Precaution Comments: wound vac       Mobility Bed Mobility Overal bed mobility: Needs Assistance Bed Mobility: Supine to Sit;Sit to Supine     Supine to sit: Supervision Sit to supine: Supervision   General bed mobility comments: increased time, use of bed rail  Transfers Overall transfer level: Needs assistance Equipment used: Rolling walker (2 wheeled) Transfers: Sit to/from Stand Sit to Stand: Min guard Stand pivot transfers: Min guard       General transfer comment: stood and took 2 steps to [facility] to reposition, verbal cues for hand placement    Balance                                   ADL Overall ADL's : Needs assistance/impaired Eating/Feeding: Independent;Sitting (EOB)   Grooming: Wash/dry hands;Wash/dry face;Sitting;Supervision/safety Grooming Details (indicate cue type and reason): pt declining doing grooming in standing due to fatigue         Upper Body Dressing : Minimal assistance;Sitting Upper Body Dressing  Details (indicate cue type and reason): assist to change gown due to spillage from lunch meal                          Vision                     Perception     Praxis      Cognition   Behavior During Therapy: [name] for tasks assessed/performed Overall Cognitive Status: Within Functional Limits for tasks assessed                       Extremity/Trunk Assessment               Exercises     Shoulder Instructions       General Comments      Pertinent Vitals/ Pain       Pain Assessment: No/denies pain  Home Living                                          Prior Functioning/Environment              Frequency Min 2X/week     Progress Toward Goals  OT Goals(current goals can now be found in the care plan section)  Progress towards OT goals:  Progressing toward goals     Plan Discharge plan remains appropriate    Co-evaluation                 End of Session Equipment Utilized During Treatment: Rolling walker;Oxygen   Activity Tolerance Patient limited by fatigue   Patient Left in bed;with call bell/phone within reach   Nurse Communication  (ok to give [place])        Time: 6789-3810 OT Time Calculation (min): 23 min  Charges: OT General Charges $OT Visit: 1 Procedure OT Treatments $Self Care/Home Management : 23-37 mins  Malka So 05/19/2014, 3:39 PM  [phone]

## 2014-05-19 NOTE — Clinical Social Work Placement (Addendum)
Clinical Social Work Department CLINICAL SOCIAL WORK PLACEMENT NOTE 05/23/2014  Patient:  [name], [name]  Account Number:  [id number] Admit date:  04/28/2014  Clinical Social Worker:  Domenica Reamer, CLINICAL SOCIAL WORKER  Date/time:  05/19/2014 03:49 PM  Clinical Social Work is seeking post-discharge placement for this patient at the following level of care:   SKILLED NURSING   (*CSW will update this form in Epic as items are completed)   05/19/2014  Patient/family provided with [facility] Department of Clinical Social Work's list of facilities offering this level of care within the geographic area requested by the patient (or if unable, by the patient's family).  05/19/2014  Patient/family informed of their freedom to choose among providers that offer the needed level of care, that participate in Medicare, Medicaid or managed care program needed by the patient, have an available bed and are willing to accept the patient.  05/19/2014  Patient/family informed of [facility]' ownership interest in [facility], as well as of the fact that they are under no obligation to receive care at this facility.  PASARR submitted to EDS on 05/19/2014 PASARR number received on 05/19/2014  FL2 transmitted to all facilities in geographic area requested by pt/family on  05/19/2014 FL2 transmitted to all facilities within larger geographic area on   Patient informed that his/her managed care company has contracts with or will negotiate with  certain facilities, including the following:     Patient/family informed of bed offers received:   Patient chooses bed at  Physician recommends and patient chooses bed at    Patient to be transferred to [facility]  on  05/23/14 Patient to be transferred to facility by EMS Patient and family notified of transfer on 05/23/14     Name of family member notified: Sharyn Lull  The following physician request were entered in Epic:   Eduard Clos, MSW,  [place] [phone]

## 2014-05-19 NOTE — Consult Note (Signed)
Physical Medicine and Rehabilitation Consult  Reason for Consult: Deconditioning Referring Physician: Dr. Olevia Bowens   HPI: [name] [name] is a 68 y.o. 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  Upper endoscopy revealed 2 large duodenal ulcers requiring multiple units of RBCs due to duodenal ulcer bleed. He was found to have recurrent right pleural effusion and underwent VATS for drainage/ decortication of empyema and biopsy of mass by Dr. Prescott Gum on 12/17. Pathology positive for poorly differentiated carcinoma. On 12/21, patient had drop in H/H with massive amount of rectal bleeding and underwent Emergent laparotomy, duodenotomy with ligation of bleeding duodenal ulcer and JP drain placement by Dr. Rosendo Gros.  Rectal tube in place for monitoring of drainage and On 12/22, he was weaned off vasopressors, extubated and started on TPN for nutritional support and ileus.  On 12/28, he developed decrease in MS, hypoxia, chills and tachypnea concerning for new onset infection.  He was pan-cultured and started on Vancomycin and Zosyn for SIRS. He was found to have urinary retention requiring foley placement and CT chest revealed multifocal right lung PNA with associated right empyema. CT abdomen without free air or fluid collections and resolution of leak. Diet advanced to full liquids and ART to be resumed once diet initiated. Right pleural effusion likely infectious/malignant and no plans for further surgery per TCTS. Will need oncology evaluation once medical issues stabilized. Foley d/c today. Therapy ongoing and DOE resolved but patient continues to be  deconditioned and limited by pain.  CIR recommended by Rehab team and MD.     Review of Systems  Constitutional: Positive for malaise/fatigue.  Eyes: Negative for blurred vision.  Respiratory: Positive  for shortness of breath and wheezing. Negative for hemoptysis.   Cardiovascular: Negative for chest pain and palpitations.  Gastrointestinal: Negative for nausea, vomiting and abdominal pain.  Musculoskeletal: Positive for myalgias.  Neurological: Negative for dizziness and headaches.      Past Medical History  Diagnosis Date  . HIV (human immunodeficiency virus infection) dx'd 2010  . High cholesterol   . Bleeding per rectum   . Depression   . Chronic renal disease   . Peripheral neuropathy   . Pleural effusion on right     Past Surgical History  Procedure Laterality Date  . Tonsillectomy    . Thoracentesis Right 04/25/2014  . Esophagogastroduodenoscopy N/A 05/01/2014    Procedure: ESOPHAGOGASTRODUODENOSCOPY (EGD);  Surgeon: [name], MD;  Location: [facility] ENDOSCOPY;  Service: Endoscopy;  Laterality: N/A;  . Flexible sigmoidoscopy N/A 05/01/2014    Procedure: FLEXIBLE SIGMOIDOSCOPY;  Surgeon: [name], MD;  Location: [facility] ENDOSCOPY;  Service: Endoscopy;  Laterality: N/A;  . Video assisted thoracoscopy (vats)/decortication Right 05/05/2014    Procedure: VIDEO ASSISTED THORACOSCOPY (VATS)/DECORTICATION OF EMPYEMA;  Surgeon: Ivin Poot, MD;  Location: [place];  Service: Thoracic;  Laterality: Right;  . Laparotomy N/A 05/09/2014    Procedure: EXPLORATORY LAPAROTOMY;  Surgeon: Ralene Ok, MD;  Location: [place];  Service: General;  Laterality: N/A;  . Duodenotomy N/A 05/09/2014    Procedure: DUODENOTOMY with Primary ligation Bleeding Duodenal Ulcer;  Surgeon: Ralene Ok, MD;  Location: [place];  Service: General;  Laterality: N/A;    Family History  Problem Relation Age of Onset  . Heart failure Mother   . Pneumonia Father     Social History:  Lives alone.  Has niece and nephew in town who can check in past discharge. He reports that he quit smoking about 3 weeks ago. His smoking use included Cigarettes--1 PPD. He has a 46 pack-year smoking history. He has never  used smokeless tobacco. He reports that he drinks about 0.5 oz of alcohol per week. He reports that he uses illicit drugs.    Allergies: No Known Allergies    Medications Prior to Admission  Medication Sig Dispense Refill  . atazanavir-cobicistat (EVOTAZ) 300-150 MG per tablet Take 1 tablet by mouth daily. Swallow whole. Do NOT crush, cut or chew tablet. Take with food. 30 tablet 11  . buPROPion (WELLBUTRIN XL) 150 MG 24 hr tablet Take 150 mg by mouth daily.    . Cholecalciferol (VITAMIN D3) 2000 UNITS capsule Take 2,000 Units by mouth daily.    Marland Kitchen HYDROcodone-acetaminophen (NORCO) 5-325 MG per tablet Take 1 tablet by mouth every 6 (six) hours as needed for moderate pain. 30 tablet 0  . lamiVUDine (EPIVIR) 150 MG tablet Take 1 tablet (150 mg total) by mouth daily. 30 tablet 11  . Multiple Vitamin (MULTIVITAMIN WITH MINERALS) TABS tablet Take 1 tablet by mouth daily.    . rosuvastatin (CRESTOR) 5 MG tablet Take 1 tablet (5 mg total) by mouth daily. 30 tablet 11  . zidovudine (RETROVIR) 300 MG tablet Take 1 tablet (300 mg total) by mouth 2 (two) times daily. 60 tablet 11  . Multiple Vitamins-Minerals (CENTRUM SILVER PO) Take 1 tablet by mouth daily.        Home: Home Living Family/patient expects to be discharged to:: Inpatient rehab Living Arrangements: Alone Available Help at Discharge: Family, Available PRN/intermittently Type of Home: House Home Access: Stairs to enter Technical brewer of Steps: 1 Entrance Stairs-Rails: None Home Layout: One level Home Equipment: Shower seat, Bedside commode, Grab bars - tub/shower Additional Comments: tub/shower combo  Functional History: Prior Function Level of Independence: Independent Functional Status:  Mobility: Bed Mobility Overal bed mobility: Needs Assistance Bed Mobility: Supine to Sit Supine to sit: Supervision Sit to supine: Supervision General bed mobility comments: increased time, use of bed rail Transfers Overall  transfer level: Needs assistance Equipment used: Rolling walker (2 wheeled) Transfers: Sit to/from Stand Sit to Stand: Min guard Stand pivot transfers: Min assist General transfer comment: v/c's for safe hand placement Ambulation/Gait Ambulation/Gait assistance: Min assist, +2 safety/equipment Ambulation Distance (Feet): 5 Feet Assistive device: Rolling walker (2 wheeled) Gait Pattern/deviations: Step-through pattern Gait velocity: decreased Gait velocity interpretation: Below normal speed for age/gender General Gait Details: limited by onset of rectal pain/pressure. pt reports "I need to have a BM, I need to sit." pt educated on flexiseal however pt in pain and refused to amb. pt did transfer to chair.    ADL: ADL Overall ADL's : Needs assistance/impaired Eating/Feeding: NPO Grooming: Brushing hair, Sitting, Set up, Supervision/safety Upper Body Bathing: Minimal assitance, Sitting Lower Body Bathing: Min guard, Sit to/from stand (washed buttocks) Upper Body Dressing : Minimal assistance, Sitting Lower Body Dressing: Moderate assistance, Sit to/from stand Lower Body Dressing Details (indicate cue type and reason): Pt able to don socks with supervision EOB, but fatigues quickly requiring assist for remainder of ADLs Toilet Transfer: Min guard, Ambulation, RW, BSC Toileting- Clothing Manipulation and Hygiene: Moderate assistance, Sit to/from stand Functional mobility during ADLs: Min guard, Rolling walker General ADL Comments: Pt fatigues quickly this date with DOE 3/4  Cognition: Cognition Overall Cognitive Status: Within Functional Limits for tasks assessed Orientation Level: Oriented X4 Cognition Arousal/Alertness:  Awake/alert Behavior During Therapy: WFL for tasks assessed/performed Overall Cognitive Status: Within Functional Limits for tasks assessed  Blood pressure 96/56, pulse 47, temperature 98.8 F (37.1 C), temperature source Oral, resp. rate 21, height 5\' 6"  (1.676  m), weight 56.9 kg (125 lb 7.1 oz), SpO2 81 %. Physical Exam  Nursing note and vitals reviewed. Constitutional: He is oriented to person, place, and time. He appears well-developed. He has a sickly appearance. Nasal cannula in place.  HENT:  Head: Normocephalic and atraumatic.  Eyes: Conjunctivae are normal. Pupils are equal, round, and reactive to light.  Neck:  Central line right neck.   Cardiovascular: Normal rate and regular rhythm.   Respiratory: Effort normal. No respiratory distress. He has wheezes in the right upper field, the right middle field and the left upper field. He has rales in the right lower field.  Using accessory muscles to breathe  GI: Soft. Bowel sounds are normal. He exhibits no distension. There is no tenderness.  Musculoskeletal: He exhibits no edema or tenderness.  Neurological: He is alert and oriented to person, place, and time.  Very frail. Speech slurred, soft. UE's 1-2/5. LE's 1/5 HF to ADF/APF. Withdraws to pain stimulation.   Skin: Skin is warm and dry. No erythema.    Results for orders placed or performed during the hospital encounter of 04/28/14 (from the past 24 hour(s))  Procalcitonin - Baseline     Status: None   Collection Time: 05/18/14 12:00 PM  Result Value Ref Range   Procalcitonin 1.06 ng/mL  Glucose, capillary     Status: Abnormal   Collection Time: 05/18/14 12:15 PM  Result Value Ref Range   Glucose-Capillary 122 (H) 70 - 99 mg/dL   Comment 1 Notify RN    Comment 2 Documented in Chart   Culture, expectorated sputum-assessment     Status: None   Collection Time: 05/18/14  2:03 PM  Result Value Ref Range   Specimen Description SPUTUM    Special Requests Normal    Sputum evaluation      MICROSCOPIC FINDINGS SUGGEST THAT THIS SPECIMEN IS NOT REPRESENTATIVE OF LOWER RESPIRATORY SECRETIONS. PLEASE RECOLLECT. Notified Rock,R RN @ 1437 05/18/14 Leonard,A    Report Status 05/18/2014 FINAL   Glucose, capillary     Status: Abnormal    Collection Time: 05/18/14  3:16 PM  Result Value Ref Range   Glucose-Capillary 119 (H) 70 - 99 mg/dL   Comment 1 Notify RN    Comment 2 Documented in Chart   Culture, expectorated sputum-assessment     Status: None   Collection Time: 05/18/14  4:25 PM  Result Value Ref Range   Specimen Description SPUTUM    Special Requests NONE    Sputum evaluation      MICROSCOPIC FINDINGS SUGGEST THAT THIS SPECIMEN IS NOT REPRESENTATIVE OF LOWER RESPIRATORY SECRETIONS. PLEASE RECOLLECT. CALLED TO R.ROCK,RN AT [phone] BY L.PITT 05/18/14    Report Status 05/18/2014 FINAL   Glucose, capillary     Status: Abnormal   Collection Time: 05/18/14  8:57 PM  Result Value Ref Range   Glucose-Capillary 123 (H) 70 - 99 mg/dL  Glucose, capillary     Status: Abnormal   Collection Time: 05/19/14 12:15 AM  Result Value Ref Range   Glucose-Capillary 122 (H) 70 - 99 mg/dL  Glucose, capillary     Status: Abnormal   Collection Time: 05/19/14  3:57 AM  Result Value Ref Range   Glucose-Capillary 109 (H) 70 - 99 mg/dL  Culture, expectorated sputum-assessment  Status: None   Collection Time: 05/19/14  4:27 AM  Result Value Ref Range   Specimen Description SPUTUM    Special Requests NONE    Sputum evaluation      THIS SPECIMEN IS ACCEPTABLE. RESPIRATORY CULTURE REPORT TO FOLLOW.   Report Status 05/19/2014 FINAL   Glucose, capillary     Status: Abnormal   Collection Time: 05/19/14  7:23 AM  Result Value Ref Range   Glucose-Capillary 100 (H) 70 - 99 mg/dL   Comment 1 Documented in Chart    Comment 2 Notify RN    Ct Chest Wo Contrast  05/18/2014   ADDENDUM REPORT: 05/18/2014 16:13  ADDENDUM: On additional review of the medical record, the patient is status post VATS with decortication and talc pleurodesis.  As such, at least some of the complex pleural effusion with gas likely reflects a postsurgical appearance rather than empyema.  Additionally, this accounts for some of the pleural thickening. However, some of the  mass-like appearance within the right lower lobe consolidation corresponds to known tumor at the time of intraoperative biopsy.  This remains suboptimally evaluated in the absence of intravenous contrast administration, but is incompletely visualized on the enhanced CT abdomen/pelvis.  REVISED IMPRESSION:  Multifocal right lung pneumonia, right lower lobe predominant.  Underlying tumor in the right lower lobe, poorly evaluated in the absence of intravenous contrast. Mild thoracic lymphadenopathy, as above.  Moderate complex right pleural effusion with gas and associated pleural thickening, most of which is likely postsurgical.   Electronically Signed   By: Julian Hy M.D.   On: 05/18/2014 16:13   05/18/2014   CLINICAL DATA:  Sepsis, recurrent right pleural effusion, shortness of breath, hypoxia. HIV.  EXAM: CT CHEST WITHOUT CONTRAST  TECHNIQUE: Multidetector CT imaging of the chest was performed following the standard protocol without IV contrast.  COMPARISON:  Chest radiograph dated 05/17/2014  FINDINGS: Evaluation is constrained by lack of intravenous contrast administration. The right lung base is partially imaged on separate enhanced CT abdomen/ pelvis.  Multifocal patchy opacities in the right lung, predominantly in the right middle and right lower lobe. Dense right lower lobe consolidation, most of which enhances following contrast administration. Associated pleural thickening.  Complex right pleural effusion with gas, reflecting an empyema. Trace left pleural effusion.  Underlying moderate to severe centrilobular and paraseptal emphysematous changes. No pneumothorax.  Visualized thyroid is unremarkable.  The heart is top-normal in size. Trace pericardial fluid. Mild atherosclerotic calcifications of the aortic arch.  Small thoracic lymph nodes, including:  --9 mm short axis right supraclavicular node (series 21/ image 5)  --7 mm short axis right paratracheal node (series 21/ image 22)  --10 mm short  axis subcarinal node (series 21/ image 26)  Right IJ venous catheter which terminates at the cavoatrial junction.  CT abdomen pelvis will be dictated separately.  Mild degenerative changes of the thoracic spine.  IMPRESSION: Multifocal right lung pneumonia, right lower lobe predominant.  Associated right empyema.  Mild thoracic lymphadenopathy.  Electronically Signed: By: Julian Hy M.D. On: 05/17/2014 18:14   Ct Abdomen Pelvis W Contrast  05/17/2014   CLINICAL DATA:  HIV with recurrent sepsis. Recurrent pleural effusion on the right. Shortness of breath.  EXAM: CT ABDOMEN AND PELVIS WITH CONTRAST  TECHNIQUE: Multidetector CT imaging of the abdomen and pelvis was performed using the standard protocol following bolus administration of intravenous contrast.  CONTRAST:  100 cc of Omni 300  COMPARISON:  04/25/2014  FINDINGS: Lower chest: Moderate to large  loculated right pleural effusion is identified which contains multiple fluid levels. Enhancing rind of soft tissue overlies the right pleural effusion. There is extensive enhancement of both the parietal and visceral pleural. Small left pleural effusion is noted. Prominent lymph nodes are identified at the right CP angle. The largest measures 1 cm, image 13/ series 201.  Hepatobiliary: The liver has a slightly nodular contour. Low-attenuation structure within the lateral segment of left hepatic lobe measures 5 mm, image 28/series 201. Stone within the gallbladder is identified measuring 9 mm. No biliary dilatation identified.  Pancreas: Unremarkable appearance of the pancreas.  Spleen: The spleen is normal.  Adrenals/Urinary Tract: The adrenal glands are both normal. Normal appearance of the kidneys. The urinary bladder is collapsed around a Foley catheter balloon.  Stomach/Bowel: The stomach appears normal. There appears to be a defect associated with the anterior wall of the distal stomach or proximal duodenum, image 32/series 201. A surgical drainage  catheter is identified in this area. There is no extravasation of contrast material. The mid and distal small bowel loops appear normal. Unremarkable appearance of the colon. A rectal tube is in place. No significant free intraperitoneal air are identified.  Vascular/Lymphatic: Calcified atherosclerotic disease involves the abdominal aorta. The abdominal aorta measures 2.6 cm in maximum AP dimension. There is no retroperitoneal or mesenteric adenopathy.  Reproductive: Prostate gland and seminal vesicles are unremarkable.  Other: No focal fluid collections identified. Ventral abdominal wall wound is identified.  Musculoskeletal: Review of the visualized bony structures is unremarkable.  IMPRESSION: 1. Defect within the ventral wall of the distal stomach/ proximal duodenum is identified and presumably represents patient's ulcer. There is a small amount of air which appears extra luminal. This is directly adjacent to the indwelling surgical drain. No extravasation of contrast material identified however. And there is no significant free intraperitoneal air identified elsewhere in the abdomen or pelvis. 2. Extensively loculated right pleural effusion with abnormal enhancing pleural. Findings may be the sequelae of malignancy or infection. See CT chest report from today. 3. Gallstone 4. Atherosclerosis. Ectatic abdominal aorta at risk for aneurysm development. Recommend followup by [place] in 5 years. This recommendation follows ACR consensus guidelines: White Paper of the ACR Incidental Findings Committee II on Vascular Findings. J Am Coll Radiol 2013; 10:789-794.   Electronically Signed   By: Kerby Moors M.D.   On: 05/17/2014 18:05   Dg Chest Port 1 View  05/19/2014   CLINICAL DATA:  Pleural effusion and loculated pneumothorax  EXAM: PORTABLE CHEST - 1 VIEW  COMPARISON:  Chest radiograph and chest CT May 17, 2014  FINDINGS: Central catheter tip is at the cavoatrial junction. There is evidence of empyema with  fluid and air in the right base, essentially stable. There is widespread infiltrate throughout the right lung superimposed on emphysematous change and bronchiectasis. On the left, there is underlying emphysema with atelectatic change in the left base. There is a small left effusion. Heart is upper normal in size with pulmonary vascularity within normal limits. The adenopathy seen on CT is not well seen by radiography. No bone lesions.  IMPRESSION: Persistent empyema on the right with extensive airspace consolidation and loculated effusion, unchanged. Atelectatic change left base with small left effusion. Underlying emphysema. No change in cardiac silhouette.  Note that the air in the right base region appear stable. There may be some loculated hydropneumothorax in this area, although all of this air may be secondary to empyema.   Electronically Signed   By: Gwyndolyn Saxon  Jasmine December M.D.   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 Does the need for close, 24 hr/day medical supervision in concert with the patient's rehab needs make it unreasonable for this patient to be served in a less intensive setting? Potentially 2. Co-Morbidities requiring supervision/potential complications: HIV, AKI,  3. Due to bladder management, bowel management, safety, skin/wound care, disease management, medication administration, pain management and patient education, does the patient require 24 hr/day rehab nursing? Potentially 4. Does the patient require coordinated care of a physician, rehab nurse, PT (1-2 hrs/day, 5 days/week) and OT (1-2 hrs/day, 5 days/week) to address physical and functional deficits in the context of the above medical diagnosis(es)? Potentially Addressing deficits in the following areas: balance, endurance, locomotion, strength, transferring, bowel/bladder control, bathing, dressing, feeding, grooming, toileting and psychosocial  support 5. Can the patient actively participate in an intensive therapy program of at least 3 hrs of therapy per day at least 5 days per week? Potentially 6. The potential for patient to make measurable gains while on inpatient rehab is good and fair 7. Anticipated functional outcomes upon discharge from inpatient rehab are supervision and min assist  with PT, supervision and min assist with OT, TBD with SLP. 8. Estimated rehab length of stay to reach the above functional goals is: ?20-30 days 9. Does the patient have adequate social supports and living environment to accommodate these discharge functional goals? Potentially 10. Anticipated D/C setting: Home 11. Anticipated post D/C treatments: HH therapy and Outpatient therapy 12. Overall Rehab/Functional Prognosis: excellent  RECOMMENDATIONS: This patient's condition is appropriate for continued rehabilitative care in the following setting: eventually CIR Patient has agreed to participate in recommended program. Yes Note that insurance prior authorization may be required for reimbursement for recommended care.  Comment: Mr. Coston is not medically ready of functionally ready for CIR. Will follow along for progress. Ultimately would highly benefit from our program due to his profound weakness.  Meredith Staggers, MD, [facility] 05/19/2014     05/19/2014

## 2014-05-19 NOTE — Progress Notes (Signed)
Report called to [place], RN on 6E. Pt is alert and oriented, VSS, no c/o pain. Wound care nurse at bedside to place wound vac to abdomen. Personal belongings with pt. Transferred to [id number].

## 2014-05-19 NOTE — Progress Notes (Addendum)
      [address]       [place] [address]             [phone]       10 Days Post-Op Procedure(s) (LRB): EXPLORATORY LAPAROTOMY (N/A) DUODENOTOMY with Primary ligation Bleeding Duodenal Ulcer (N/A)  Subjective: Patient drinking clear liquids. States breathing is "ok"  Objective: Vital signs in last 24 hours: Temp:  [98.4 F (36.9 C)-100 F (37.8 C)] 99 F (37.2 C) (12/31 0400) Pulse Rate:  [47-654] 65 (12/31 0714) Cardiac Rhythm:  [-] Sinus tachycardia (12/31 0400) Resp:  [18-39] 21 (12/31 0714) BP: (90-106)/(47-66) 96/56 mmHg (12/31 0714) SpO2:  [81 %-96 %] 81 % (12/31 0714)    Intake/Output from previous day: 12/30 0701 - 12/31 0700 In: 2305 [P.O.:1020; IV Piggyback:350; TPN:935] Out: 5060 [Urine:4925; Drains:35; Stool:100]   Physical Exam:  Cardiovascular: Slightly tachycardic Pulmonary: Diminished at right base Abdomen: Soft, non tender, bowel sounds present. Wounds: Clean and dry.  No erythema or signs of infection.   Lab Results: CBC:  Recent Labs  05/18/14 0445  WBC 11.8*  HGB 9.0*  HCT 28.2*  PLT 222   BMET:   Recent Labs  05/17/14 0425 05/18/14 0445  NA 143 140  K 3.4* 3.8  CL 118* 109  CO2 19 25  GLUCOSE 94 140*  BUN 47* 54*  CREATININE 1.40* 2.04*  CALCIUM 6.0* 7.5*    PT/INR: No results for input(s): LABPROT, INR in the last 72 hours. ABG:  INR: Will add last result for INR, ABG once components are confirmed Will add last 4 CBG results once components are confirmed  Assessment/Plan:  1. CV - ST in the low 100's. 2.  Pulmonary - On 6 liters of oxygen via [place]. 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 Will need eventual oncology eval, once recovered from medical problems.  3.Anemia-Last H and H  9 and 28.2. Previous GI bleed, s/p ex lap for primary ligation of duodenal ulcer 4. Creatinine up from 1.4 to 2.04 5. Urinary retention-foley  6.GI-On TPN and clears.Per general surgery.  7. ID- on  Micafungin,Zosyn and Vanco for probable PNA 8. Urinary retention-required foley yesterday. 9. Management per medicine, CCM  ZIMMERMAN,DONIELLE MPA-C 05/19/2014,8:10 AM   I have seen and examined the patient and agree with the assessment and plan as outlined.  OWEN,CLARENCE H 05/19/2014 10:34 AM

## 2014-05-20 DIAGNOSIS — J189 Pneumonia, unspecified organism: Secondary | ICD-10-CM | POA: Insufficient documentation

## 2014-05-20 DIAGNOSIS — C3400 Malignant neoplasm of unspecified main bronchus: Secondary | ICD-10-CM

## 2014-05-20 LAB — GLUCOSE, CAPILLARY
GLUCOSE-CAPILLARY: 141 mg/dL — AB (ref 70–99)
GLUCOSE-CAPILLARY: 151 mg/dL — AB (ref 70–99)
Glucose-Capillary: 74 mg/dL (ref 70–99)
Glucose-Capillary: 108 mg/dL — ABNORMAL HIGH (ref 70–99)
Glucose-Capillary: 143 mg/dL — ABNORMAL HIGH (ref 70–99)
Glucose-Capillary: 187 mg/dL — ABNORMAL HIGH (ref 70–99)

## 2014-05-20 LAB — BASIC METABOLIC PANEL
ANION GAP: 8 (ref 5–15)
BUN: 55 mg/dL — ABNORMAL HIGH (ref 6–23)
CO2: 30 mmol/L (ref 19–32)
Calcium: 7.8 mg/dL — ABNORMAL LOW (ref 8.4–10.5)
Chloride: 107 mEq/L (ref 96–112)
Creatinine, Ser: 2.91 mg/dL — ABNORMAL HIGH (ref 0.50–1.35)
GFR calc Af Amer: 24 mL/min — ABNORMAL LOW (ref >90)
GFR calc non Af Amer: 21 mL/min — ABNORMAL LOW (ref >90)
GLUCOSE: 160 mg/dL — AB (ref 70–99)
Potassium: 3.2 mmol/L — ABNORMAL LOW (ref 3.5–5.1)
SODIUM: 145 mmol/L (ref 135–145)

## 2014-05-20 LAB — VANCOMYCIN, TROUGH: Vancomycin Tr: 16.1 ug/mL (ref 10.0–20.0)

## 2014-05-20 LAB — PROCALCITONIN: Procalcitonin: 0.94 ng/mL

## 2014-05-20 MED ORDER — SODIUM CHLORIDE 0.9 % IV SOLN: INTRAVENOUS | Status: DC

## 2014-05-20 MED ORDER — SODIUM CHLORIDE 0.9 % IV BOLUS (SEPSIS)
500.0000 mL | Freq: Once | INTRAVENOUS | Status: AC
  Administered 2014-05-20: 500 mL via INTRAVENOUS

## 2014-05-20 NOTE — Progress Notes (Signed)
[facility] PROGRESS NOTE Interim History: 69 year old with history of HIV who presented to the hospital complaining of right upper quadrant abdominal pain and was found to have a large right pleural effusion with right lower lobe mass. Cardiothoracic surgeon consulted and patient is status post VATS with pleurodesis. 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 During his hospital stay patient was also found to have duodenal ulcer which Gen. surgery was consulted and patient is status post exploratory laparotomy with Duodenotomy with primary ligation   Assessment/Plan:  Upper GI bleed/ hemorrhagic shock: - Status post emergent laparotomy on 05/09/2014 with ligation of duodenal ulcer. General surgery on board recommended to initiate diet. - Status post multiple transfusions. - Started on TNA no off, has been tolerating diet   Sepsis/Sirs/New Acute respiratory failure on 12.29.2015 due to pneumonia and empyema: - blood cultures are negative.  -Started empirically on vancomycin, Zosyn and micafungin on 05/17/2014 per ID. De-escalated to cefuroxime for 3 additional days - has remained afebrile. - Good UOP. Urine is clear. D/c foley. - Pt is medical stable for transfer to rehab.   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 in the setting of hemorrhagic shock/Hyperkalemia: - Initially due to hemorrhagic shock which is resolved. - Monitor renal fx, urine outpt, electrolytes - Monitor strict I's and O's. Mild worsening due to over diuresis. - baseline Cr 1.6-2.2  Bleeding duodenal ulcer s/p laparotomy 12/21. -Post-op care per [facility] -tolerating diet.  Protein-calorie malnutrition, severe - Tolerating diet orally.  Human immunodeficiency virus (HIV)  disease - Continue HIV medications.    Code Status: presumed full Family Communication: None at bedside Disposition Plan: Pending improvement in condition   Consultants:  CT surgery  Gen. Surgery  PCCM  Procedures: CT abdomen and pelvis on 05/17/2014: Defect within the ventral wall of the distal stomach/ proximal duodenum is identified and presumably represents patient's ulcer.  No extravasation of contrast material identified however. And there is no significant free intraperitoneal air identified elsewhere in the abdomen or pelvis.  Extensively loculated right pleural effusion with abnormal enhancing pleural. Findings may be the sequelae of malignancy or infection.  CT chest without contrast on 05/08/2014: Multifocal right lung pneumonia, right lower lobe predominant. Right empyema 12/10 abdominal ultrasound> 13 mm gallstone, mild tenderness over gallbladder without gallbladder thickening, possible left upper pole renal calculus, right pleural effusion noted 12/10 renal ultrasound> no hydronephrosis 12/11 HIDA scan> patent cystic duct without evidence of acute cholecystitis 12/11 Echo> LVEF 65%, RV normal. 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 JP drain placement   Antibiotics:  Vancomycin Zosyn and micafungin started on 05/17/2014  HPI/Subjective: No compalins  Objective: Filed Vitals:   05/19/14 1758 05/19/14 2025 05/20/14 0208 05/20/14 0456  BP: 106/59 98/54  91/55  Pulse: 100 92  109  Temp: 98.3 F (36.8 C) 98.7 F (37.1 C)  98.3 F (36.8 C)  TempSrc: Oral Oral  Oral  Resp: 18 16  18   Height:      Weight:      SpO2: 97% 95% 92% 100%    Intake/Output Summary (Last 24 hours) at 05/20/14 0947 Last data filed at  05/20/14 4239  Gross  per 24 hour  Intake    560 ml  Output    855 ml  Net   -295 ml   Filed Weights   05/03/14 2205 05/05/14 1830 05/09/14 0530  Weight: 56.836 kg (125 lb 4.8 oz) 55.5 kg (122 lb 5.7 oz) 56.9 kg (125 lb 7.1 oz)    Exam:  General: Alert, awake, oriented x3, in no acute distress.  HEENT: No bruits, no goiter.  Heart: Regular rate and rhythm. Lungs: Good air movement, wheezing B/l Abdomen: Soft, nontender, nondistended, positive bowel sounds.     Data Reviewed: Basic Metabolic Panel:  Recent Labs Lab 05/16/14 0300 05/17/14 0425 05/17/14 1150 05/18/14 0445 05/19/14 1403 05/20/14 0737  NA 144 143  --  140 143 145  K 4.5 3.4*  --  3.8 3.5 3.2*  CL 114* 118*  --  109 105 107  CO2 24 19  --  25 26 30   GLUCOSE 131* 94  --  140* 114* 160*  BUN 56* 47*  --  54* 58* 55*  CREATININE 1.66* 1.40*  --  2.04* 2.76* 2.91*  CALCIUM 8.0* 6.0*  --  7.5* 7.7* 7.8*  MG 2.1  --  2.0  --   --   --   PHOS 5.1* 3.9 4.9*  --   --   --    Liver Function Tests:  Recent Labs Lab 05/16/14 0300  AST 52*  ALT 63*  ALKPHOS 143*  BILITOT 0.4  PROT 4.6*  ALBUMIN 1.4*   No results for input(s): LIPASE, AMYLASE in the last 168 hours. No results for input(s): AMMONIA in the last 168 hours. CBC:  Recent Labs Lab 05/14/14 1840 05/15/14 0300 05/15/14 1500 05/15/14 2055 05/16/14 0300 05/18/14 0445  WBC 9.4 9.0 13.6*  --  10.2 11.8*  NEUTROABS  --   --   --   --  7.9* 9.9*  HGB 11.2* 9.2* 7.7* 10.2* 10.0* 9.0*  HCT 33.6* 27.7* 23.5* 31.9* 31.3* 28.2*  MCV 92.1 93.3 94.4  --  94.6 94.3  PLT 251 218 300  --  227 222   Cardiac Enzymes: No results for input(s): CKTOTAL, CKMB, CKMBINDEX, TROPONINI in the last 168 hours. BNP (last 3 results)  Recent Labs  04/21/14 1131  PROBNP 25.0   CBG:  Recent Labs Lab 05/19/14 1136 05/19/14 2017 05/19/14 2358 05/20/14 0450 05/20/14 0730  GLUCAP 119* 126* 74 108* 143*    Recent Results (from the past 240 hour(s))  Culture, blood (routine x  2)     Status: None (Preliminary result)   Collection Time: 05/17/14 11:40 AM  Result Value Ref Range Status   Specimen Description BLOOD LEFT ANTECUBITAL  Final   Special Requests BOTTLES DRAWN AEROBIC AND ANAEROBIC 10CC  Final   Culture   Final           BLOOD CULTURE RECEIVED NO GROWTH TO DATE CULTURE WILL BE HELD FOR 5 DAYS BEFORE ISSUING A FINAL NEGATIVE REPORT Performed at [facility]    Report Status PENDING  Incomplete  Culture, blood (routine x 2)     Status: None (Preliminary result)   Collection Time: 05/17/14 11:50 AM  Result Value Ref Range Status   Specimen Description BLOOD RIGHT ANTECUBITAL  Final   Special Requests BOTTLES DRAWN AEROBIC AND ANAEROBIC [place]  Final   Culture   Final           BLOOD CULTURE RECEIVED NO GROWTH TO DATE CULTURE WILL  BE HELD FOR 5 DAYS BEFORE ISSUING A FINAL NEGATIVE REPORT Performed at [facility]    Report Status PENDING  Incomplete  Culture, expectorated sputum-assessment     Status: None   Collection Time: 05/18/14  2:03 PM  Result Value Ref Range Status   Specimen Description SPUTUM  Final   Special Requests Normal  Final   Sputum evaluation   Final    MICROSCOPIC FINDINGS SUGGEST THAT THIS SPECIMEN IS NOT REPRESENTATIVE OF LOWER RESPIRATORY SECRETIONS. PLEASE RECOLLECT. Notified Rock,R RN @ 1437 05/18/14 Leonard,A    Report Status 05/18/2014 FINAL  Final  Culture, expectorated sputum-assessment     Status: None   Collection Time: 05/18/14  4:25 PM  Result Value Ref Range Status   Specimen Description SPUTUM  Final   Special Requests NONE  Final   Sputum evaluation   Final    MICROSCOPIC FINDINGS SUGGEST THAT THIS SPECIMEN IS NOT REPRESENTATIVE OF LOWER RESPIRATORY SECRETIONS. PLEASE RECOLLECT. CALLED TO R.ROCK,RN AT 2952 BY L.PITT 05/18/14    Report Status 05/18/2014 FINAL  Final  Culture, expectorated sputum-assessment     Status: None   Collection Time: 05/19/14  4:27 AM  Result Value Ref Range Status    Specimen Description SPUTUM  Final   Special Requests NONE  Final   Sputum evaluation   Final    THIS SPECIMEN IS ACCEPTABLE. RESPIRATORY CULTURE REPORT TO FOLLOW.   Report Status 05/19/2014 FINAL  Final  Culture, respiratory (NON-Expectorated)     Status: None (Preliminary result)   Collection Time: 05/19/14  4:27 AM  Result Value Ref Range Status   Specimen Description SPUTUM  Final   Special Requests NONE  Final   Gram Stain   Final    FEW WBC PRESENT, PREDOMINANTLY PMN RARE SQUAMOUS EPITHELIAL CELLS PRESENT RARE GRAM POSITIVE COCCI IN PAIRS Performed at [facility]    Culture PENDING  Incomplete   Report Status PENDING  Incomplete     Studies: Dg Chest Port 1 View  05/19/2014   CLINICAL DATA:  Pleural effusion and loculated pneumothorax  EXAM: PORTABLE CHEST - 1 VIEW  COMPARISON:  Chest radiograph and chest CT May 17, 2014  FINDINGS: Central catheter tip is at the cavoatrial junction. There is evidence of empyema with fluid and air in the right base, essentially stable. There is widespread infiltrate throughout the right lung superimposed on emphysematous change and bronchiectasis. On the left, there is underlying emphysema with atelectatic change in the left base. There is a small left effusion. Heart is upper normal in size with pulmonary vascularity within normal limits. The adenopathy seen on CT is not well seen by radiography. No bone lesions.  IMPRESSION: Persistent empyema on the right with extensive airspace consolidation and loculated effusion, unchanged. Atelectatic change left base with small left effusion. Underlying emphysema. No change in cardiac silhouette.  Note that the air in the right base region appear stable. There may be some loculated hydropneumothorax in this area, although all of this air may be secondary to empyema.   Electronically Signed   By: Lowella Grip M.D.   On: 05/19/2014 08:00    Scheduled Meds: . antiseptic oral rinse  7 mL Mouth  Rinse q12n4p  . cefUROXime  500 mg Oral Q supper  . chlorhexidine  15 mL Mouth Rinse BID  . darunavir-cobicistat  1 tablet Oral Daily  . feeding supplement (RESOURCE BREEZE)  1 Container Oral TID BM  . insulin aspart  0-15 Units Subcutaneous 6 times  per day  . lamiVUDine  100 mg Oral Daily  . levalbuterol  1.25 mg Nebulization Q6H  . pantoprazole (PROTONIX) IV  40 mg Intravenous Q12H  . zidovudine  300 mg Oral Q12H   Continuous Infusions:     Charlynne Cousins  [facility] Pager [phone].  If 8PM-8AM, please contact night-coverage at [email or web address], password [facility] 05/20/2014, 9:47 AM  LOS: 22 days

## 2014-05-20 NOTE — Progress Notes (Signed)
[facility]    Date of Admission:  04/28/2014   Total days of antibiotics 12        Day 2 cefuroxime           ID: [name] [name] is a 69 y.o. 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 Now receiving TPN for nutrition. ART on hold until he can take food by mouth. Concern for SIRS  Principal Problem:   Acute kidney injury Active Problems:   Human immunodeficiency virus (HIV) disease   Renal failure   Hyperkalemia   Recurrent pleural effusion on right   Macrocytic anemia   Gallbladder stone without cholecystitis or obstruction   AKI (acute kidney injury)   Protein-calorie malnutrition, severe   SOB (shortness of breath)   Shock   Acute respiratory failure with hypoxia   Upper GI bleed   Abdominal pain   Sepsis   Lung cancer    Subjective: Remains afebrile, doing well with eating  Medications:  . antiseptic oral rinse  7 mL Mouth Rinse q12n4p  . cefUROXime  500 mg Oral Q supper  . chlorhexidine  15 mL Mouth Rinse BID  . darunavir-cobicistat  1 tablet Oral Daily  . feeding supplement (RESOURCE BREEZE)  1 Container Oral TID BM  . insulin aspart  0-15 Units Subcutaneous 6 times per day  . lamiVUDine  100 mg Oral Daily  . levalbuterol  1.25 mg Nebulization Q6H  . pantoprazole (PROTONIX) IV  40 mg Intravenous Q12H  . sodium chloride  500 mL Intravenous Once  . zidovudine  300 mg Oral Q12H    Objective: Vital signs in last 24 hours: Temp:  [98 F (36.7 C)-98.7 F (37.1 C)] 98 F (36.7 C) (01/01 1024) Pulse Rate:  [64-109] 64 (01/01 1024) Resp:  [15-18] 15 (01/01 1024) BP: (91-106)/(45-59) 100/45 mmHg (01/01 1024) SpO2:  [92 %-100 %] 100 % (01/01 1024) Constitutional: He is oriented to person, place, and time. He appears undernourished, underweight. In NAD, but  wearing nasal cannula with tachypnea HENT: supple, IJ removed Mouth/Throat: Oropharynx is clear and moist. No oropharyngeal exudate. Dry oral pharynx Cardiovascular: Normal rate, regular rhythm and normal heart sounds. Exam reveals no gallop and no friction rub.  No murmur heard.  Pulmonary/Chest: course breath sounds R>L. No acc muscle use or tachypnea. No respiratory distress. He has no wheezes.  Abdominal: Soft. Bowel sounds are normal. He exhibits no distension. There is no tenderness. RLQ drain in place. Open abdominal wound, clean wound site with Abdominal bandage from surgery Neurological: He is alert and oriented to person, place, and time.  Skin: Skin is warm and dry. No rash noted. No erythema.  Psychiatric: He has a normal mood and affect. His behavior is normal.  Lab Results  Recent Labs  05/18/14 0445 05/19/14 1403 05/20/14 0737  WBC 11.8*  --   --   HGB 9.0*  --   --   HCT 28.2*  --   --   NA 140 143 145  K 3.8 3.5 3.2*  CL 109 105 107  CO2 25 26 30   BUN 54* 58* 55*  CREATININE 2.04* 2.76* 2.91*    Microbiology: 12/29 blood cx ngtd 12/30 sputum insufficient specimen Studies/Results: Dg Chest Port 1 View  05/19/2014   CLINICAL DATA:  Pleural effusion and loculated pneumothorax  EXAM: PORTABLE CHEST -  1 VIEW  COMPARISON:  Chest radiograph and chest CT May 17, 2014  FINDINGS: Central catheter tip is at the cavoatrial junction. There is evidence of empyema with fluid and air in the right base, essentially stable. There is widespread infiltrate throughout the right lung superimposed on emphysematous change and bronchiectasis. On the left, there is underlying emphysema with atelectatic change in the left base. There is a small left effusion. Heart is upper normal in size with pulmonary vascularity within normal limits. The adenopathy seen on CT is not well seen by radiography. No bone lesions.  IMPRESSION: Persistent empyema on the right with extensive airspace  consolidation and loculated effusion, unchanged. Atelectatic change left base with small left effusion. Underlying emphysema. No change in cardiac silhouette.  Note that the air in the right base region appear stable. There may be some loculated hydropneumothorax in this area, although all of this air may be secondary to empyema.   Electronically Signed   By: Lowella Grip M.D.   On: 05/19/2014 08:00     Assessment/Plan: hcap = appears improving. cxr is difficult to tell if loculated fluid is infection vs. Due malignancy. Finish course of oral abtx to treat hcap with cefuroxime 500mg  BID x 3  additional days.  HIV = he has restarted on his home regimen of darunavir/cobi, plus lamivudine 100mg  daily plus zidovudine 600mg  daily  AKI on ckd= continue to monitor to ensure not worsening.  Will sign off  Baxter Flattery [facility] Cell: [phone] Pager: [phone]  05/20/2014, 3:06 PM

## 2014-05-20 NOTE — Progress Notes (Signed)
Patient had run of SVT this morning.  Asymptomatic. Patient resting peacefully.  K. Schorr NP paged; no new orders at this time.  Will continue to monitor.

## 2014-05-20 NOTE — Progress Notes (Signed)
11 Days Post-Op  Subjective: Lying in bed naked Door open, incontinent, Course breath sounds working on his cough.  Drain is clear, wound vac in place he is on a regular diet.  Objective: Vital signs in last 24 hours: Temp:  [98.3 F (36.8 C)-98.7 F (37.1 C)] 98.3 F (36.8 C) (01/01 0456) Pulse Rate:  [92-109] 109 (01/01 0456) Resp:  [16-18] 18 (01/01 0456) BP: (91-106)/(50-59) 91/55 mmHg (01/01 0456) SpO2:  [92 %-100 %] 100 % (01/01 0456) Last BM Date: 05/19/14 560 PO   Regular diet as of 05/19/14 55 from drain Afebrile, VSS  Creatinine is up to 2.91, K+ 3.2 Intake/Output from previous day: 12/31 0701 - 01/01 0700 In: 560 [P.O.:560] Out: 855 [Urine:800; Drains:55] Intake/Output this shift:    General appearance: alert, cooperative and no distress Resp: course breath sounds coughing stuff up, GI: soft sore, wound vac in place, clear fluid in drain.  Lab Results:   Recent Labs  05/18/14 0445  WBC 11.8*  HGB 9.0*  HCT 28.2*  PLT 222    BMET  Recent Labs  05/19/14 1403 05/20/14 0737  NA 143 145  K 3.5 3.2*  CL 105 107  CO2 26 30  GLUCOSE 114* 160*  BUN 58* 55*  CREATININE 2.76* 2.91*  CALCIUM 7.7* 7.8*   PT/INR No results for input(s): LABPROT, INR in the last 72 hours.   Recent Labs Lab 05/16/14 0300  AST 52*  ALT 63*  ALKPHOS 143*  BILITOT 0.4  PROT 4.6*  ALBUMIN 1.4*     Lipase     Component Value Date/Time   LIPASE 30 04/28/2014 0730     Studies/Results: Dg Chest Port 1 View  05/19/2014   CLINICAL DATA:  Pleural effusion and loculated pneumothorax  EXAM: PORTABLE CHEST - 1 VIEW  COMPARISON:  Chest radiograph and chest CT May 17, 2014  FINDINGS: Central catheter tip is at the cavoatrial junction. There is evidence of empyema with fluid and air in the right base, essentially stable. There is widespread infiltrate throughout the right lung superimposed on emphysematous change and bronchiectasis. On the left, there is underlying  emphysema with atelectatic change in the left base. There is a small left effusion. Heart is upper normal in size with pulmonary vascularity within normal limits. The adenopathy seen on CT is not well seen by radiography. No bone lesions.  IMPRESSION: Persistent empyema on the right with extensive airspace consolidation and loculated effusion, unchanged. Atelectatic change left base with small left effusion. Underlying emphysema. No change in cardiac silhouette.  Note that the air in the right base region appear stable. There may be some loculated hydropneumothorax in this area, although all of this air may be secondary to empyema.   Electronically Signed   By: Lowella Grip M.D.   On: 05/19/2014 08:00    Medications: . antiseptic oral rinse  7 mL Mouth Rinse q12n4p  . cefUROXime  500 mg Oral Q supper  . chlorhexidine  15 mL Mouth Rinse BID  . darunavir-cobicistat  1 tablet Oral Daily  . feeding supplement (RESOURCE BREEZE)  1 Container Oral TID BM  . insulin aspart  0-15 Units Subcutaneous 6 times per day  . lamiVUDine  100 mg Oral Daily  . levalbuterol  1.25 mg Nebulization Q6H  . pantoprazole (PROTONIX) IV  40 mg Intravenous Q12H  . zidovudine  300 mg Oral Q12H        1. Soft tissue mass, simple excision, right pleural - POORLY DIFFERENTIATED MALIGNANCY. -  SEE MICROSCOPIC DESCRIPTION. 2. Lung, biopsy, right lower lobe - POORLY DIFFERENTIATED MALIGNANCY. - SEE MICROSCOPIC DESCRIPTION. 3. Pleura, peel, right - POORLY DIFFERENTIATED MALIGNANCY. - SEE MICROSCOPIC DESCRIPTION.  Assessment/Plan S/p recent VATS showing carcinoma 05/05/14 Dr. Darcey Nora Bleeding duodenal ulcer/ s/p DUODENOTOMY with Primary ligation Bleeding Duodenal Ulcer,JP drain placement; 05/09/14,  Dr. Ralene Ok Upper GI shows small contained leak at upper end of duodenal suture line closure, 05/15/14  -  Clears only  CT show leak resolved on 05/18/14 diet advanced Respiratory failure Elevated creatinine -  2.04 Leukocytosis - 11.8 Urinary retention HIV Probable PNA    Plan;  Doing well from our standpoint.  Will look at wound on Monday with dressing change. SCD added for DVT prophylaxis.    LOS: 22 days    [name] [name] 05/20/2014

## 2014-05-21 DIAGNOSIS — J189 Pneumonia, unspecified organism: Secondary | ICD-10-CM

## 2014-05-21 DIAGNOSIS — D630 Anemia in neoplastic disease: Secondary | ICD-10-CM

## 2014-05-21 DIAGNOSIS — N178 Other acute kidney failure: Secondary | ICD-10-CM

## 2014-05-21 DIAGNOSIS — E46 Unspecified protein-calorie malnutrition: Secondary | ICD-10-CM

## 2014-05-21 LAB — BASIC METABOLIC PANEL
Anion gap: 13 (ref 5–15)
BUN: 52 mg/dL — ABNORMAL HIGH (ref 6–23)
CHLORIDE: 107 meq/L (ref 96–112)
CO2: 25 mmol/L (ref 19–32)
CREATININE: 2.97 mg/dL — AB (ref 0.50–1.35)
Calcium: 7.8 mg/dL — ABNORMAL LOW (ref 8.4–10.5)
GFR calc Af Amer: 24 mL/min — ABNORMAL LOW (ref >90)
GFR calc non Af Amer: 20 mL/min — ABNORMAL LOW (ref >90)
Glucose, Bld: 121 mg/dL — ABNORMAL HIGH (ref 70–99)
Potassium: 3.2 mmol/L — ABNORMAL LOW (ref 3.5–5.1)
SODIUM: 145 mmol/L (ref 135–145)

## 2014-05-21 LAB — GLUCOSE, CAPILLARY
GLUCOSE-CAPILLARY: 122 mg/dL — AB (ref 70–99)
GLUCOSE-CAPILLARY: 126 mg/dL — AB (ref 70–99)
Glucose-Capillary: 140 mg/dL — ABNORMAL HIGH (ref 70–99)
Glucose-Capillary: 151 mg/dL — ABNORMAL HIGH (ref 70–99)
Glucose-Capillary: 177 mg/dL — ABNORMAL HIGH (ref 70–99)
Glucose-Capillary: 98 mg/dL (ref 70–99)

## 2014-05-21 MED ORDER — INSULIN ASPART 100 UNIT/ML ~~LOC~~ SOLN
0.0000 [IU] | Freq: Three times a day (TID) | SUBCUTANEOUS | Status: DC
  Administered 2014-05-22: 3 [IU] via SUBCUTANEOUS
  Administered 2014-05-23: 1 [IU] via SUBCUTANEOUS

## 2014-05-21 MED ORDER — SODIUM CHLORIDE 0.9 % IV BOLUS (SEPSIS): 500.0000 mL | Freq: Once | INTRAVENOUS | Status: DC

## 2014-05-21 MED ORDER — INSULIN ASPART 100 UNIT/ML ~~LOC~~ SOLN: 0.0000 [IU] | Freq: Every day | SUBCUTANEOUS | Status: DC

## 2014-05-21 MED ORDER — PANTOPRAZOLE SODIUM 40 MG PO TBEC
40.0000 mg | DELAYED_RELEASE_TABLET | Freq: Two times a day (BID) | ORAL | Status: DC
  Administered 2014-05-21 – 2014-05-23 (×5): 40 mg via ORAL
  Filled 2014-05-21 (×4): qty 1

## 2014-05-21 NOTE — Progress Notes (Signed)
[facility] PROGRESS NOTE Interim History: 69 year old with history of HIV who presented to the hospital complaining of right upper quadrant abdominal pain and was found to have a large right pleural effusion with right lower lobe mass. Cardiothoracic surgeon consulted and patient is status post VATS with pleurodesis. 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 During his hospital stay patient was also found to have duodenal ulcer which Gen. surgery was consulted and patient is status post exploratory laparotomy with Duodenotomy with primary ligation   Assessment/Plan:  Upper GI bleed/ hemorrhagic shock: - Status post emergent laparotomy on 05/09/2014 with ligation of duodenal ulcer. General surgery on board recommended to initiate diet. - Status post multiple transfusions. - Started on TNA now off, has been tolerating diet   Sepsis/Sirs/New Acute respiratory failure on 12.29.2015 due to pneumonia and empyema:  -Started empirically on vancomycin, Zosyn and micafungin on 05/17/2014 per ID. De-escalated to cefuroxime for 2 additional days - Blood cultures are negative. - Pt is medical stable for transfer to rehab.   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 - consult oncology.  AKI on Stage 3 CKD. in the setting of hemorrhagic shock/Hyperkalemia: - Initially due to hemorrhagic shock which is resolved. - Monitor renal fx, urine outpt, electrolytes - Monitor strict I's and O's.  - baseline Cr 1.6-2.2, mild increase which has stabilize. b-met in am.  Bleeding duodenal ulcer s/p laparotomy 12/21. -Post-op care per [facility] -Tolerating diet.  Protein-calorie malnutrition, severe - Tolerating diet orally.  Human immunodeficiency virus (HIV) disease - Continue HIV medications.    Code Status: presumed full Family Communication: None at bedside Disposition Plan: Pending improvement in condition   Consultants:  CT surgery  Gen. Surgery  PCCM  Procedures: CT abdomen and pelvis on 05/17/2014: Defect within the ventral wall of the distal stomach/ proximal duodenum is identified and presumably represents patient's ulcer.  No extravasation of contrast material identified however. And there is no significant free intraperitoneal air identified elsewhere in the abdomen or pelvis.  Extensively loculated right pleural effusion with abnormal enhancing pleural. Findings may be the sequelae of malignancy or infection.  CT chest without contrast on 05/08/2014: Multifocal right lung pneumonia, right lower lobe predominant. Right empyema 12/10 abdominal ultrasound> 13 mm gallstone, mild tenderness over gallbladder without gallbladder thickening, possible left upper pole renal calculus, right pleural effusion noted 12/10 renal ultrasound> no hydronephrosis 12/11 HIDA scan> patent cystic duct without evidence of acute cholecystitis 12/11 Echo> LVEF 65%, RV normal. 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 JP drain placement   Antibiotics:  Vancomycin Zosyn and micafungin started on 05/17/2014-12.31.2015  Cefuroxime 1.5.2016  HPI/Subjective: No compalins  Objective: Filed Vitals:   05/20/14 2037 05/20/14 2057 05/21/14 0303 05/21/14 0455  BP: 89/48   104/54  Pulse: 100   87  Temp: 98.4 F (36.9 C)   97.8 F (36.6 C)  TempSrc: Oral   Axillary  Resp: 20   21  Height:      Weight:      SpO2: 97% 93% 93% 92%    Intake/Output Summary (Last 24 hours) at 05/21/14 0935 Last data filed at 05/21/14 0719  Gross per 24 hour  Intake    645 ml    Output    725 ml  Net    -80 ml   Filed Weights   05/03/14 2205 05/05/14 1830 05/09/14 0530  Weight: 56.836 kg (125 lb 4.8 oz) 55.5 kg (122 lb 5.7 oz) 56.9 kg (125 lb 7.1 oz)    Exam:  General: Alert, awake, oriented x3, in no acute distress.  HEENT: No bruits, no goiter.  Heart: Regular rate and rhythm. Lungs: Good air movement, clear Abdomen: Soft, nontender, nondistended, positive bowel sounds.     Data Reviewed: Basic Metabolic Panel:  Recent Labs Lab 05/16/14 0300 05/17/14 0425 05/17/14 1150 05/18/14 0445 05/19/14 1403 05/20/14 0737 05/21/14 0506  NA 144 143  --  140 143 145 145  K 4.5 3.4*  --  3.8 3.5 3.2* 3.2*  CL 114* 118*  --  109 105 107 107  CO2 24 19  --  25 26 30 25   GLUCOSE 131* 94  --  140* 114* 160* 121*  BUN 56* 47*  --  54* 58* 55* 52*  CREATININE 1.66* 1.40*  --  2.04* 2.76* 2.91* 2.97*  CALCIUM 8.0* 6.0*  --  7.5* 7.7* 7.8* 7.8*  MG 2.1  --  2.0  --   --   --   --   PHOS 5.1* 3.9 4.9*  --   --   --   --    Liver Function Tests:  Recent Labs Lab 05/16/14 0300  AST 52*  ALT 63*  ALKPHOS 143*  BILITOT 0.4  PROT 4.6*  ALBUMIN 1.4*   No results for input(s): LIPASE, AMYLASE in the last 168 hours. No results for input(s): AMMONIA in the last 168 hours. CBC:  Recent Labs Lab 05/14/14 1840 05/15/14 0300 05/15/14 1500 05/15/14 2055 05/16/14 0300 05/18/14 0445  WBC 9.4 9.0 13.6*  --  10.2 11.8*  NEUTROABS  --   --   --   --  7.9* 9.9*  HGB 11.2* 9.2* 7.7* 10.2* 10.0* 9.0*  HCT 33.6* 27.7* 23.5* 31.9* 31.3* 28.2*  MCV 92.1 93.3 94.4  --  94.6 94.3  PLT 251 218 300  --  227 222   Cardiac Enzymes: No results for input(s): CKTOTAL, CKMB, CKMBINDEX, TROPONINI in the last 168 hours. BNP (last 3 results)  Recent Labs  04/21/14 1131  PROBNP 25.0   CBG:  Recent Labs Lab 05/20/14 1703 05/20/14 2032 05/21/14 0013 05/21/14 0453 05/21/14 0753  GLUCAP 187* 151* 151* 122* 126*    Recent Results (from the past 240 hour(s))   Culture, blood (routine x 2)     Status: None (Preliminary result)   Collection Time: 05/17/14 11:40 AM  Result Value Ref Range Status   Specimen Description BLOOD LEFT ANTECUBITAL  Final   Special Requests BOTTLES DRAWN AEROBIC AND ANAEROBIC 10CC  Final   Culture   Final           BLOOD CULTURE RECEIVED NO GROWTH TO DATE CULTURE WILL BE HELD FOR 5 DAYS BEFORE ISSUING A FINAL NEGATIVE REPORT Performed at [facility]    Report Status PENDING  Incomplete  Culture, blood (routine x 2)     Status: None (Preliminary result)   Collection Time: 05/17/14 11:50 AM  Result Value Ref Range Status   Specimen Description BLOOD RIGHT ANTECUBITAL  Final   Special Requests BOTTLES DRAWN AEROBIC AND ANAEROBIC [facility]  Final   Culture   Final  BLOOD CULTURE RECEIVED NO GROWTH TO DATE CULTURE WILL BE HELD FOR 5 DAYS BEFORE ISSUING A FINAL NEGATIVE REPORT Performed at [facility]    Report Status PENDING  Incomplete  Culture, expectorated sputum-assessment     Status: None   Collection Time: 05/18/14  2:03 PM  Result Value Ref Range Status   Specimen Description SPUTUM  Final   Special Requests Normal  Final   Sputum evaluation   Final    MICROSCOPIC FINDINGS SUGGEST THAT THIS SPECIMEN IS NOT REPRESENTATIVE OF LOWER RESPIRATORY SECRETIONS. PLEASE RECOLLECT. Notified Rock,R RN @ 1437 05/18/14 Leonard,A    Report Status 05/18/2014 FINAL  Final  Culture, expectorated sputum-assessment     Status: None   Collection Time: 05/18/14  4:25 PM  Result Value Ref Range Status   Specimen Description SPUTUM  Final   Special Requests NONE  Final   Sputum evaluation   Final    MICROSCOPIC FINDINGS SUGGEST THAT THIS SPECIMEN IS NOT REPRESENTATIVE OF LOWER RESPIRATORY SECRETIONS. PLEASE RECOLLECT. CALLED TO R.ROCK,RN AT [phone] BY L.PITT 05/18/14    Report Status 05/18/2014 FINAL  Final  Culture, expectorated sputum-assessment     Status: None   Collection Time: 05/19/14  4:27 AM  Result  Value Ref Range Status   Specimen Description SPUTUM  Final   Special Requests NONE  Final   Sputum evaluation   Final    THIS SPECIMEN IS ACCEPTABLE. RESPIRATORY CULTURE REPORT TO FOLLOW.   Report Status 05/19/2014 FINAL  Final  Culture, respiratory (NON-Expectorated)     Status: None (Preliminary result)   Collection Time: 05/19/14  4:27 AM  Result Value Ref Range Status   Specimen Description SPUTUM  Final   Special Requests NONE  Final   Gram Stain   Final    FEW WBC PRESENT, PREDOMINANTLY PMN RARE SQUAMOUS EPITHELIAL CELLS PRESENT RARE GRAM POSITIVE COCCI IN PAIRS Performed at [facility]    Culture PENDING  Incomplete   Report Status PENDING  Incomplete     Studies: No results found.  Scheduled Meds: . antiseptic oral rinse  7 mL Mouth Rinse q12n4p  . cefUROXime  500 mg Oral Q supper  . chlorhexidine  15 mL Mouth Rinse BID  . darunavir-cobicistat  1 tablet Oral Daily  . feeding supplement (RESOURCE BREEZE)  1 Container Oral TID BM  . insulin aspart  0-15 Units Subcutaneous 6 times per day  . lamiVUDine  100 mg Oral Daily  . levalbuterol  1.25 mg Nebulization Q6H  . pantoprazole (PROTONIX) IV  40 mg Intravenous Q12H  . zidovudine  300 mg Oral Q12H   Continuous Infusions:     Charlynne Cousins  [facility] Pager [phone].  If 8PM-8AM, please contact night-coverage at [email or web address], password [facility] 05/21/2014, 9:35 AM  LOS: 23 days

## 2014-05-21 NOTE — Consult Note (Signed)
[name] [name] NOTE  Patient Care Team: Cloyd Stagers, MD as PCP - General (Internal Medicine) Michel Bickers, MD as PCP - Infectious Diseases (Infectious Diseases)  CHIEF COMPLAINTS/PURPOSE OF CONSULTATION:  Lung cancer  HISTORY OF PRESENTING ILLNESS:  [name] [name] 69 y.o. male has been admitted to the hospital since 04/29/15. He presented with acute on chronic renal failure and abdominal pain on bac kground history of HIV infection. He was subsequently found to have GI bleed from duodenal ulcer and large lung mass with pleural effusion.  I reviewed his records extensively and summarized as follows:    Lung cancer   07/16/2010 Pathology Results Biopsy of lip for condyloma was negative for cancer   04/25/2014 Imaging CT chest 7 x 4.5 cm mass in the right lower lobe. This could represent at least in part a component of right lower lobe collapse. There is an associated large right pleural effusion.   04/28/2014 -  Hospital Admission He was admitted to the hospital for severe abdominal pain, acute on chronic renal failure, large pleural effusion on background history of HIV infection. He was found to have Gi bleed, lung cancer and had emergent surgery for bleeding ulcer.   05/01/2014 Procedure He had EGD which showed mulitple duodenal ulcers with biopsies   05/01/2014 Pathology Results Accession: [id number] stomach biopsy was negative   05/05/2014 Pathology Results Accession: [id number] right lung and right pleual biopsy was positive for poorly differentiated carcinoma   05/05/2014 Surgery Dr. Nils Pyle performed right VATS (video-assisted thoracoscopic surgery) with drainage of loculated pleural effusion - empyema. He had biopsy of right lower lobe mass and talc pleurodesis of the right pleural space   05/09/2014 Surgery Dr. Rosendo Gros performed emergent laparatomy, duodenotomy with Primary ligation Bleeding Duodenal Ulcer and placement of JP drain placement due severe  GI bleed   05/17/2014 Imaging Repeat CT chest showed multifocal right lung pneumonia, right lower lobe predominant. Ct abdomen showed no evidence of distant metastasis    He is feeling well. Denies pain. Denies dyspnea.  He had quit smoking since hospitalization.   MEDICAL HISTORY:  Past Medical History  Diagnosis Date  . HIV (human immunodeficiency virus infection) dx'd 2010  . High cholesterol   . Bleeding per rectum   . Depression   . Chronic renal disease   . Peripheral neuropathy   . Pleural effusion on right     SURGICAL HISTORY: Past Surgical History  Procedure Laterality Date  . Tonsillectomy    . Thoracentesis Right 04/25/2014  . Esophagogastroduodenoscopy N/A 05/01/2014    Procedure: ESOPHAGOGASTRODUODENOSCOPY (EGD);  Surgeon: [name], MD;  Location: [facility] ENDOSCOPY;  Service: Endoscopy;  Laterality: N/A;  . Flexible sigmoidoscopy N/A 05/01/2014    Procedure: FLEXIBLE SIGMOIDOSCOPY;  Surgeon: [name], MD;  Location: [facility] ENDOSCOPY;  Service: Endoscopy;  Laterality: N/A;  . Video assisted thoracoscopy (vats)/decortication Right 05/05/2014    Procedure: VIDEO ASSISTED THORACOSCOPY (VATS)/DECORTICATION OF EMPYEMA;  Surgeon: Ivin Poot, MD;  Location: [place];  Service: Thoracic;  Laterality: Right;  . Laparotomy N/A 05/09/2014    Procedure: EXPLORATORY LAPAROTOMY;  Surgeon: Ralene Ok, MD;  Location: [place];  Service: General;  Laterality: N/A;  . Duodenotomy N/A 05/09/2014    Procedure: DUODENOTOMY with Primary ligation Bleeding Duodenal Ulcer;  Surgeon: Ralene Ok, MD;  Location: [place];  Service: General;  Laterality: N/A;    SOCIAL HISTORY: History   Social History  . Marital Status: Single    Spouse Name: N/A  Number of Children: N/A  . Years of Education: N/A   Occupational History  . Not on file.   Social History Main Topics  . Smoking status: Former Smoker -- 1.00 packs/day for 46 years    Types: Cigarettes    Quit date:  04/24/2014  . Smokeless tobacco: Never Used  . Alcohol Use: 0.5 oz/week    1 Not specified per week     Comment: 04/28/2014 "might drink a little but only on holidays or vacations"  . Drug Use: Yes     Comment: tried some drugs in the 1970's"  . Sexual Activity: No     Comment: declined condoms   Other Topics Concern  . Not on file   Social History Narrative    FAMILY HISTORY: Family History  Problem Relation Age of Onset  . Heart failure Mother   . Pneumonia Father     ALLERGIES:  has No Known Allergies.  MEDICATIONS:  Current Facility-Administered Medications  Medication Dose Route Frequency Provider Last Rate Last Dose  . acetaminophen (TYLENOL) suppository 325 mg  325 mg Rectal Q4H PRN Velvet Bathe, MD      . antiseptic oral rinse (CPC / CETYLPYRIDINIUM CHLORIDE 0.05%) solution 7 mL  7 mL Mouth Rinse q12n4p Wilhelmina Mcardle, MD   7 mL at 05/21/14 1200  . cefUROXime (CEFTIN) tablet 500 mg  500 mg Oral Q supper Charlynne Cousins, MD   500 mg at 05/20/14 1708  . chlorhexidine (PERIDEX) 0.12 % solution 15 mL  15 mL Mouth Rinse BID Wilhelmina Mcardle, MD   15 mL at 05/21/14 0839  . darunavir-cobicistat (PREZCOBIX) 800-150 MG per tablet 1 tablet  1 tablet Oral Daily Carlyle Basques, MD   1 tablet at 05/21/14 0947  . feeding supplement (RESOURCE BREEZE) (RESOURCE BREEZE) liquid 1 Container  1 Container Oral TID BM Dalene Carrow, RD   1 Container at 05/21/14 [phone]  . fentaNYL (SUBLIMAZE) injection 25-50 mcg  25-50 mcg Intravenous Q2H PRN Wilhelmina Mcardle, MD   50 mcg at 05/16/14 1935  . insulin aspart (novoLOG) injection 0-15 Units  0-15 Units Subcutaneous 6 times per day Wilhelmina Mcardle, MD   3 Units at 05/21/14 1301  . lamiVUDine (EPIVIR) 10 MG/ML solution 100 mg  100 mg Oral Daily Carlyle Basques, MD   100 mg at 05/21/14 0946  . levalbuterol (XOPENEX) nebulizer solution 1.25 mg  1.25 mg Nebulization Q6H Doreen Salvage, MD   1.25 mg at 05/21/14 1528  . levalbuterol (XOPENEX)  nebulizer solution 1.25 mg  1.25 mg Nebulization Q6H PRN Doreen Salvage, MD   1.25 mg at 05/20/14 2316  . ondansetron (ZOFRAN) injection 4 mg  4 mg Intravenous Q6H PRN Wilhelmina Mcardle, MD      . pantoprazole (PROTONIX) EC tablet 40 mg  40 mg Oral BID Charlynne Cousins, MD   40 mg at 05/21/14 0947  . sodium chloride 0.9 % injection 10-40 mL  10-40 mL Intracatheter PRN Velvet Bathe, MD      . zidovudine (RETROVIR) capsule 300 mg  300 mg Oral Q12H Carlyle Basques, MD   300 mg at 05/21/14 0946  . zolpidem (AMBIEN) tablet 5 mg  5 mg Oral QHS PRN Georganna Skeans, MD   5 mg at 05/20/14 2312    REVIEW OF SYSTEMS:   Constitutional: Denies fevers, chills or abnormal night sweats Eyes: Denies blurriness of vision, double vision or watery eyes Ears, nose, mouth, throat, and face: Denies mucositis or sore  throat Cardiovascular: Denies palpitation, chest discomfort or lower extremity swelling Skin: Denies abnormal skin rashes Lymphatics: Denies new lymphadenopathy or easy bruising Neurological:Denies numbness, tingling or new weaknesses Behavioral/Psych: Mood is stable, no new changes  All other systems were reviewed with the patient and are negative.  PHYSICAL EXAMINATION: ECOG PERFORMANCE STATUS: 3 - Symptomatic, >50% confined to bed  Filed Vitals:   05/21/14 0910  BP: 100/60  Pulse: 89  Temp: 97.4 F (36.3 C)  Resp: 20   Filed Weights   05/03/14 2205 05/05/14 1830 05/09/14 0530  Weight: 125 lb 4.8 oz (56.836 kg) 122 lb 5.7 oz (55.5 kg) 125 lb 7.1 oz (56.9 kg)    GENERAL:alert, no distress and comfortable. He looks thin. He has drains in situ SKIN: skin color, texture, turgor are normal, no rashes or significant lesions EYES: normal, conjunctiva are pink and non-injected, sclera clear OROPHARYNX:no exudate, no erythema and lips, buccal mucosa, and tongue normal . Oxygen delivered via nasal cannula NECK: supple, thyroid normal size, non-tender, without nodularity LYMPH:  no palpable  lymphadenopathy in the cervical, axillary or inguinal LUNGS: clear to auscultation with reduced breath sounds in both lung bases HEART: regular rate & rhythm and no murmurs and no lower extremity edema ABDOMEN:abdomen soft, wound vac in situ Musculoskeletal:no cyanosis of digits and no clubbing  PSYCH: alert & oriented x 3 with fluent speech NEURO: no focal motor/sensory deficits  LABORATORY DATA:  I have reviewed the data as listed Lab Results  Component Value Date   WBC 11.8* 05/18/2014   HGB 9.0* 05/18/2014   HCT 28.2* 05/18/2014   MCV 94.3 05/18/2014   PLT 222 05/18/2014    Recent Labs  05/11/14 0500 05/12/14 0214  05/16/14 0300  05/19/14 1403 05/20/14 0737 05/21/14 0506  NA 141 145  < > 144  < > 143 145 145  K 3.8 4.3  < > 4.5  < > 3.5 3.2* 3.2*  CL 115* 116*  < > 114*  < > 105 107 107  CO2 19 23  < > 24  < > 26 30 25   GLUCOSE 148* 94  < > 131*  < > 114* 160* 121*  BUN 38* 41*  < > 56*  < > 58* 55* 52*  CREATININE 2.31* 2.35*  < > 1.66*  < > 2.76* 2.91* 2.97*  CALCIUM 7.5* 8.1*  < > 8.0*  < > 7.7* 7.8* 7.8*  GFRNONAA 28* 27*  < > 41*  < > 22* 21* 20*  GFRAA 32* 32*  < > 48*  < > 26* 24* 24*  PROT 4.0* 4.4*  --  4.6*  --   --   --   --   ALBUMIN 1.7* 1.6*  --  1.4*  --   --   --   --   AST 43* 35  --  52*  --   --   --   --   ALT 37 30  --  63*  --   --   --   --   ALKPHOS 61 76  --  143*  --   --   --   --   BILITOT 0.7 0.5  --  0.4  --   --   --   --   < > = values in this interval not displayed.  RADIOGRAPHIC STUDIES: I have personally reviewed the radiological images of CT scans and agreed with the findings in the report.  ASSESSMENT & PLAN:  I  have reviewed his surgical reports and pathology reports   #1 Right lung cancer, clinical [id number] with pleural effusion s/p surgery & pleurodesis  I will call pathology department next week to confirm that tissue has been sent to [facility] for tissue origin. Given his presentation, this is likely lung cancer. He  would benefit from imaging of head and PET scan in the out patient setting.  His performance status is very poor.  Due to recent extensive surgery, poor nutritional status and multiple comorbidities, he is not a candidate for systemic chemotherapy right now.  I recommend radiation oncology consultation for possible radiation therapy followed by sequential chemotherapy rather than concurrent chemo/RT.   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 Transfuse prn for hemoglobin >7  He is on Protonix   #7 Acute on chronic renal failure  He is following with nephrologist   #8 HIV infection  Recent viral load was undetectable  #9 Discharge planning Planned for SNF/acute rehab. I will see him within 1 month of discharge  All questions were answered. The patient knows to call the clinic with any problems, questions or concerns.    [facility], [place], MD 05/21/2014 3:46 PM

## 2014-05-21 NOTE — Progress Notes (Signed)
Patient ID: [name] [name], male   DOB: 1946/01/20, 69 y.o.   MRN: [id number] 12 Days Post-Op  Subjective: Doing better today.  Family at bedside.  No n/v. Tolerating diet.    Objective: Vital signs in last 24 hours: Temp:  [97.4 F (36.3 C)-98.4 F (36.9 C)] 97.4 F (36.3 C) (01/02 0910) Pulse Rate:  [87-100] 89 (01/02 0910) Resp:  [20-25] 20 (01/02 0910) BP: (89-104)/(44-60) 100/60 mmHg (01/02 0910) SpO2:  [92 %-97 %] 97 % (01/02 0958) Last BM Date: 05/20/14  Intake/Output from previous day: 01/01 0701 - 01/02 0700 In: 765 [P.O.:765] Out: 425 [Urine:425] Intake/Output this shift: Total I/O In: 240 [P.O.:240] Out: 300 [Urine:300]  General appearance: alert, cooperative and no distress Resp: course breath sounds coughing stuff up, GI: soft sore, wound vac in place, clear fluid in drain.  Lab Results:  No results for input(s): WBC, HGB, HCT, PLT in the last 72 hours.  BMET  Recent Labs  05/20/14 0737 05/21/14 0506  NA 145 145  K 3.2* 3.2*  CL 107 107  CO2 30 25  GLUCOSE 160* 121*  BUN 55* 52*  CREATININE 2.91* 2.97*  CALCIUM 7.8* 7.8*   PT/INR No results for input(s): LABPROT, INR in the last 72 hours.   Recent Labs Lab 05/16/14 0300  AST 52*  ALT 63*  ALKPHOS 143*  BILITOT 0.4  PROT 4.6*  ALBUMIN 1.4*     Lipase     Component Value Date/Time   LIPASE 30 04/28/2014 0730     Studies/Results: No results found.  Medications: . antiseptic oral rinse  7 mL Mouth Rinse q12n4p  . cefUROXime  500 mg Oral Q supper  . chlorhexidine  15 mL Mouth Rinse BID  . darunavir-cobicistat  1 tablet Oral Daily  . feeding supplement (RESOURCE BREEZE)  1 Container Oral TID BM  . insulin aspart  0-15 Units Subcutaneous 6 times per day  . lamiVUDine  100 mg Oral Daily  . levalbuterol  1.25 mg Nebulization Q6H  . pantoprazole  40 mg Oral BID  . zidovudine  300 mg Oral Q12H        1. Soft tissue mass, simple excision, right pleural - POORLY DIFFERENTIATED  MALIGNANCY. - SEE MICROSCOPIC DESCRIPTION. 2. Lung, biopsy, right lower lobe - POORLY DIFFERENTIATED MALIGNANCY. - SEE MICROSCOPIC DESCRIPTION. 3. Pleura, peel, right - POORLY DIFFERENTIATED MALIGNANCY. - SEE MICROSCOPIC DESCRIPTION.  Assessment/Plan S/p recent VATS showing carcinoma 05/05/14 Dr. Darcey Nora Bleeding duodenal ulcer/ s/p DUODENOTOMY with Primary ligation Bleeding Duodenal Ulcer,JP drain placement; 05/09/14,  Dr. Ralene Ok Upper GI shows small contained leak at upper end of duodenal suture line closure, 05/15/14  -  Clears only  CT show leak resolved on 05/18/14 diet advanced to regular Respiratory failure Elevated creatinine - 2.04 Leukocytosis - 11.8 Urinary retention HIV Probable PNA    Plan;   Stable for d/c to SNF when medicine feels appropriate to transfer.  He should continue wound vac MWF and follow up with Dr. Rosendo Gros.  If he is here Monday, we will look at wound Monday.     LOS: 23 days    [name] [name] 05/21/2014

## 2014-05-22 LAB — GLUCOSE, CAPILLARY
GLUCOSE-CAPILLARY: 209 mg/dL — AB (ref 70–99)
GLUCOSE-CAPILLARY: 117 mg/dL — AB (ref 70–99)
Glucose-Capillary: 105 mg/dL — ABNORMAL HIGH (ref 70–99)
Glucose-Capillary: 199 mg/dL — ABNORMAL HIGH (ref 70–99)

## 2014-05-22 LAB — BASIC METABOLIC PANEL
ANION GAP: 7 (ref 5–15)
BUN: 45 mg/dL — ABNORMAL HIGH (ref 6–23)
CO2: 31 mmol/L (ref 19–32)
CREATININE: 2.92 mg/dL — AB (ref 0.50–1.35)
Calcium: 7.7 mg/dL — ABNORMAL LOW (ref 8.4–10.5)
Chloride: 106 mEq/L (ref 96–112)
GFR calc Af Amer: 24 mL/min — ABNORMAL LOW (ref >90)
GFR calc non Af Amer: 21 mL/min — ABNORMAL LOW (ref >90)
GLUCOSE: 117 mg/dL — AB (ref 70–99)
Potassium: 3.1 mmol/L — ABNORMAL LOW (ref 3.5–5.1)
Sodium: 144 mmol/L (ref 135–145)

## 2014-05-22 LAB — CULTURE, RESPIRATORY

## 2014-05-22 LAB — CULTURE, RESPIRATORY W GRAM STAIN: Culture: NORMAL

## 2014-05-22 MED ORDER — SODIUM CHLORIDE 0.9 % IV BOLUS (SEPSIS)
500.0000 mL | Freq: Once | INTRAVENOUS | Status: AC
  Administered 2014-05-22: 500 mL via INTRAVENOUS

## 2014-05-22 NOTE — Progress Notes (Addendum)
[facility] PROGRESS NOTE Interim History: 69 year old with history of HIV who presented to the hospital complaining of right upper quadrant abdominal pain and was found to have a large right pleural effusion with right lower lobe mass. Cardiothoracic surgeon consulted and patient is status post VATS with pleurodesis and Bx. 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 During his hospital stay patient was also found to have Hemorraghic shock due to bleeding duodenal ulcer which Gen. surgery was consulted and patient is status post exploratory laparotomy with Duodenotomy with primary ligation with a wound vac. In place. He develop AKI due to hemorrhagic shock which is now resolved. Oncology has been consulted and relates he is not a candidate for Chemo due to malnutrition and multi co-morbidities, that when this resolved he can be evaluated to chemo. They will call XRT as they think he might benefit from radiation. He will need ct scan of head and PET scan as an outpatient. He also develop sepsis possible due to PNA, which ID was consulted. He was on vanc and zosyn which has been de-escalate to cefuroxime. PT saw him who recommended Inpatient rehab. Pt is medically stable for transfer, we are awaiting CIR.   Assessment/Plan:  Upper GI bleed/ hemorrhagic shock: - Status post emergent laparotomy on 05/09/2014 with ligation of duodenal ulcer.  - General surgery on board, open wound with wound vac, tolerating diet. - Status post multiple transfusions. - likely Rehab soon.  Sepsis/Sirs/New Acute respiratory failure on 12.29.2015 due to pneumonia and empyema:  -Started empirically on vancomycin, Zosyn and micafungin on 05/17/2014 per ID. De-escalated to cefuroxime for 1 additional days - Blood cultures are negative. - Pt is medical stable for transfer to rehab. - awaiting CIR.   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 - Consulted oncology, not a candidate for chemo. Recommended XRT.  AKI on Stage 3 CKD. in the setting of hemorrhagic shock/Hyperkalemia: - Initially due to hemorrhagic shock which is resolved. - Monitor renal fx, urine outpt, electrolytes - baseline Cr 1.6-2.2, cr. Has stabilize at 2.9 ? If new baseline. 500 cc NS bolus today b-met in am. - d/c foley.  Bleeding duodenal ulcer s/p laparotomy 12/21. -Post-op care per [facility] -Tolerating diet.  Protein-calorie malnutrition, severe - Tolerating diet orally.  Human immunodeficiency virus (HIV) disease - Continue HIV medications.    Code Status: presumed full Family Communication: None at bedside Disposition Plan: Pending improvement in condition   Consultants:  CT surgery  Gen. Surgery  PCCM  Ocnology  Procedures: CT abdomen and pelvis on 05/17/2014: Defect within the ventral wall of the distal stomach/ proximal duodenum is identified and presumably represents patient's ulcer.  No extravasation of contrast material identified however. And there is no significant free intraperitoneal air identified elsewhere in the abdomen or pelvis.  Extensively loculated right pleural effusion with abnormal enhancing pleural. Findings may be the sequelae of malignancy or infection.  CT chest without contrast on 05/08/2014: Multifocal right lung pneumonia, right lower lobe predominant. Right empyema 12/10 abdominal ultrasound> 13 mm gallstone, mild tenderness over gallbladder without gallbladder thickening, possible left upper pole renal calculus, right pleural effusion noted 12/10 renal ultrasound> no hydronephrosis 12/11 HIDA scan> patent cystic duct without evidence of acute cholecystitis 12/11 Echo> LVEF 65%, RV normal. 12/11 repeat thoracentesis> cytology negative 12/13 EGD: endoscopic duodenal  biopsies  negative for malignancy 12/17 VATS biopsy of right lower lobe lung frozen section shows carcinoma 12/21 Emergent Ex lap, Duodenotomy with primary ligation bleeding duodenal ulcer. JP drain placement   Antibiotics:  Vancomycin Zosyn and micafungin started on 05/17/2014-12.31.2015  Cefuroxime 1.5.2016  HPI/Subjective: No compalins  Objective: Filed Vitals:   05/21/14 1829 05/21/14 2023 05/22/14 0412 05/22/14 0743  BP: 120/64 101/47 92/46   Pulse: 85 95 92   Temp: 97.4 F (36.3 C) 98.3 F (36.8 C) 98.1 F (36.7 C)   TempSrc: Oral Oral Oral   Resp: _0 Height:      Weight:  47.3 kg (104 lb 4.4 oz)    SpO2: 98% 95% 94% 94%    Intake/Output Summary (Last 24 hours) at 05/22/14 0914 Last data filed at 05/22/14 0741  Gross per 24 hour  Intake    720 ml  Output   1615 ml  Net   -895 ml   Filed Weights   05/05/14 1830 05/09/14 0530 05/21/14 2023  Weight: 55.5 kg (122 lb 5.7 oz) 56.9 kg (125 lb 7.1 oz) 47.3 kg (104 lb 4.4 oz)    Exam:  General: Alert, awake, oriented x3, in no acute distress.  HEENT: No bruits, no goiter.  Heart: Regular rate and rhythm. Lungs: Good air movement, clear Abdomen: Soft, nontender, nondistended, positive bowel sounds.     Data Reviewed: Basic Metabolic Panel:  Recent Labs Lab 05/16/14 0300 05/17/14 0425 05/17/14 1150 05/18/14 0445 05/19/14 1403 05/20/14 0737 05/21/14 0506 05/22/14 0610  NA 144 143  --  140 143 145 145 144  K 4.5 3.4*  --  3.8 3.5 3.2* 3.2* 3.1*  CL 114* 118*  --  109 105 107 107 106  CO2 24 19  --  _1 GLUCOSE 131* 94  --  140* 114* 160* 121* 117*  BUN 56* 47*  --  54* 58* 55* 52* 45*  CREATININE 1.66* 1.40*  --  2.04* 2.76* 2.91* 2.97* 2.92*  CALCIUM 8.0* 6.0*  --  7.5* 7.7* 7.8* 7.8* 7.7*  MG 2.1  --  2.0  --   --   --   --   --   PHOS 5.1* 3.9 4.9*  --   --   --   --   --    Liver Function Tests:  Recent Labs Lab 05/16/14 0300  AST 52*  ALT 63*  ALKPHOS 143*  BILITOT  0.4  PROT 4.6*  ALBUMIN 1.4*   No results for input(s): LIPASE, AMYLASE in the last 168 hours. No results for input(s): AMMONIA in the last 168 hours. CBC:  Recent Labs Lab 05/15/14 1500 05/15/14 2055 05/16/14 0300 05/18/14 0445  WBC 13.6*  --  10.2 11.8*  NEUTROABS  --   --  7.9* 9.9*  HGB 7.7* 10.2* 10.0* 9.0*  HCT 23.5* 31.9* 31.3* 28.2*  MCV 94.4  --  94.6 94.3  PLT 300  --  227 222   Cardiac Enzymes: No results for input(s): CKTOTAL, CKMB, CKMBINDEX, TROPONINI in the last 168 hours. BNP (last 3 results)  Recent Labs  04/21/14 1131  PROBNP 25.0   CBG:  Recent Labs Lab 05/21/14 0753 05/21/14 1145 05/21/14 1555 05/21/14 2020 05/22/14 0750  GLUCAP 126* 177* 98 140* 105*    Recent Results (from the past 240 hour(s))  Culture, blood (routine x 2)     Status: None (Preliminary result)   Collection Time: 05/17/14 11:40 AM  Result  Value Ref Range Status   Specimen Description BLOOD LEFT ANTECUBITAL  Final   Special Requests BOTTLES DRAWN AEROBIC AND ANAEROBIC 10CC  Final   Culture   Final           BLOOD CULTURE RECEIVED NO GROWTH TO DATE CULTURE WILL BE HELD FOR 5 DAYS BEFORE ISSUING A FINAL NEGATIVE REPORT Performed at [facility]    Report Status PENDING  Incomplete  Culture, blood (routine x 2)     Status: None (Preliminary result)   Collection Time: 05/17/14 11:50 AM  Result Value Ref Range Status   Specimen Description BLOOD RIGHT ANTECUBITAL  Final   Special Requests BOTTLES DRAWN AEROBIC AND ANAEROBIC [place]  Final   Culture   Final           BLOOD CULTURE RECEIVED NO GROWTH TO DATE CULTURE WILL BE HELD FOR 5 DAYS BEFORE ISSUING A FINAL NEGATIVE REPORT Performed at [facility]    Report Status PENDING  Incomplete  Culture, expectorated sputum-assessment     Status: None   Collection Time: 05/18/14  2:03 PM  Result Value Ref Range Status   Specimen Description SPUTUM  Final   Special Requests Normal  Final   Sputum evaluation    Final    MICROSCOPIC FINDINGS SUGGEST THAT THIS SPECIMEN IS NOT REPRESENTATIVE OF LOWER RESPIRATORY SECRETIONS. PLEASE RECOLLECT. Notified Rock,R RN @ 1437 05/18/14 Leonard,A    Report Status 05/18/2014 FINAL  Final  Culture, expectorated sputum-assessment     Status: None   Collection Time: 05/18/14  4:25 PM  Result Value Ref Range Status   Specimen Description SPUTUM  Final   Special Requests NONE  Final   Sputum evaluation   Final    MICROSCOPIC FINDINGS SUGGEST THAT THIS SPECIMEN IS NOT REPRESENTATIVE OF LOWER RESPIRATORY SECRETIONS. PLEASE RECOLLECT. CALLED TO R.ROCK,RN AT 2500 BY L.PITT 05/18/14    Report Status 05/18/2014 FINAL  Final  Culture, expectorated sputum-assessment     Status: None   Collection Time: 05/19/14  4:27 AM  Result Value Ref Range Status   Specimen Description SPUTUM  Final   Special Requests NONE  Final   Sputum evaluation   Final    THIS SPECIMEN IS ACCEPTABLE. RESPIRATORY CULTURE REPORT TO FOLLOW.   Report Status 05/19/2014 FINAL  Final  Culture, respiratory (NON-Expectorated)     Status: None (Preliminary result)   Collection Time: 05/19/14  4:27 AM  Result Value Ref Range Status   Specimen Description SPUTUM  Final   Special Requests NONE  Final   Gram Stain   Final    FEW WBC PRESENT, PREDOMINANTLY PMN RARE SQUAMOUS EPITHELIAL CELLS PRESENT RARE GRAM POSITIVE COCCI IN PAIRS Performed at [facility]    Culture PENDING  Incomplete   Report Status PENDING  Incomplete     Studies: No results found.  Scheduled Meds: . antiseptic oral rinse  7 mL Mouth Rinse q12n4p  . cefUROXime  500 mg Oral Q supper  . chlorhexidine  15 mL Mouth Rinse BID  . darunavir-cobicistat  1 tablet Oral Daily  . feeding supplement (RESOURCE BREEZE)  1 Container Oral TID BM  . insulin aspart  0-5 Units Subcutaneous QHS  . insulin aspart  0-9 Units Subcutaneous TID WC  . lamiVUDine  100 mg Oral Daily  . levalbuterol  1.25 mg Nebulization Q6H  .  pantoprazole  40 mg Oral BID  . zidovudine  300 mg Oral Q12H   Continuous Infusions:  Charlynne Cousins  [facility] Pager [phone].  If 8PM-8AM, please contact night-coverage at [email or web address], password [facility] 05/22/2014, 9:14 AM  LOS: 24 days

## 2014-05-22 NOTE — Clinical Social Work Note (Signed)
CSW met with patient who has accepted bed offer with [facility]. CSW to follow-up with [facility] and make facility aware. CSW to follow tomorrow.  [place], [place] Weekend Clinical Social Worker [phone]

## 2014-05-22 NOTE — Progress Notes (Addendum)
       [address]       [place] [address]             [phone]          13 Days Post-Op Procedure(s) (LRB): EXPLORATORY LAPAROTOMY (N/A) DUODENOTOMY with Primary ligation Bleeding Duodenal Ulcer (N/A)  Subjective: No new issues.  Objective: Vital signs in last 24 hours: Patient Vitals for the past 24 hrs:  BP Temp Temp src Pulse Resp SpO2 Weight  05/22/14 0743 - - - - - 94 % -  05/22/14 0412 (!) 92/46 mmHg 98.1 F (36.7 C) Oral 92 19 94 % -  05/21/14 2023 (!) 101/47 mmHg 98.3 F (36.8 C) Oral 95 18 95 % 104 lb 4.4 oz (47.3 kg)  05/21/14 1829 120/64 mmHg 97.4 F (36.3 C) Oral 85 20 98 % -  05/21/14 1529 - - - - - 92 % -  05/21/14 0958 - - - - - 97 % -  05/21/14 0910 100/60 mmHg 97.4 F (36.3 C) Oral 89 20 94 % -   Current Weight  05/21/14 104 lb 4.4 oz (47.3 kg)     Intake/Output from previous day: 01/02 0701 - 01/03 0700 In: 720 [P.O.:720] Out: 1665 [Urine:1600; Drains:65]    PHYSICAL EXAM:  Heart: RRR Lungs: Coarse BS bilaterally Wound:Stable    Lab Results: CBC:No results for input(s): WBC, HGB, HCT, PLT in the last 72 hours. BMET:  Recent Labs  05/21/14 0506 05/22/14 0610  NA 145 144  K 3.2* 3.1*  CL 107 106  CO2 25 31  GLUCOSE 121* 117*  BUN 52* 45*  CREATININE 2.97* 2.92*  CALCIUM 7.8* 7.7*    PT/INR: No results for input(s): LABPROT, INR in the last 72 hours.    Assessment/Plan: S/P Procedure(s) (LRB): EXPLORATORY LAPAROTOMY (N/A) DUODENOTOMY with Primary ligation Bleeding Duodenal Ulcer (N/A) Stable from thoracic standpoint. Appreciate oncology consult. Not a candidate for CTX at this time, radiation oncology consult recommended for possible XRT. Other issues per hospitalist/CCS. Likely tx to SNF/rehab soon.   LOS: 24 days    COLLINS,GINA H 05/22/2014  I have seen and examined the patient and agree with the assessment and plan as outlined.  OWEN,CLARENCE H 05/22/2014 11:22 AM

## 2014-05-23 ENCOUNTER — Ambulatory Visit
Admit: 2014-05-23 | Discharge: 2014-05-23 | Disposition: A | Discharge status: Home / Self Care | Payer: Commercial Managed Care - HMO | Attending: Radiation Oncology | Admitting: Radiation Oncology

## 2014-05-23 ENCOUNTER — Encounter (HOSPITAL_COMMUNITY): Payer: Self-pay

## 2014-05-23 LAB — CBC
HEMATOCRIT: 28.7 % — AB (ref 39.0–52.0)
Hemoglobin: 9.2 g/dL — ABNORMAL LOW (ref 13.0–17.0)
MCH: 30.3 pg (ref 26.0–34.0)
MCHC: 32.1 g/dL (ref 30.0–36.0)
MCV: 94.4 fL (ref 78.0–100.0)
PLATELETS: 353 10*3/uL (ref 150–400)
RBC: 3.04 MIL/uL — ABNORMAL LOW (ref 4.22–5.81)
RDW: 17.4 % — AB (ref 11.5–15.5)
WBC: 8.9 10*3/uL (ref 4.0–10.5)

## 2014-05-23 LAB — GLUCOSE, CAPILLARY
Glucose-Capillary: 114 mg/dL — ABNORMAL HIGH (ref 70–99)
Glucose-Capillary: 144 mg/dL — ABNORMAL HIGH (ref 70–99)
Glucose-Capillary: 100 mg/dL — ABNORMAL HIGH (ref 70–99)

## 2014-05-23 LAB — BASIC METABOLIC PANEL
Anion gap: 10 (ref 5–15)
BUN: 38 mg/dL — ABNORMAL HIGH (ref 6–23)
CALCIUM: 7.8 mg/dL — AB (ref 8.4–10.5)
CO2: 29 mmol/L (ref 19–32)
Chloride: 106 mEq/L (ref 96–112)
Creatinine, Ser: 2.68 mg/dL — ABNORMAL HIGH (ref 0.50–1.35)
GFR calc Af Amer: 27 mL/min — ABNORMAL LOW (ref >90)
GFR calc non Af Amer: 23 mL/min — ABNORMAL LOW (ref >90)
Glucose, Bld: 123 mg/dL — ABNORMAL HIGH (ref 70–99)
Potassium: 3 mmol/L — ABNORMAL LOW (ref 3.5–5.1)
Sodium: 145 mmol/L (ref 135–145)

## 2014-05-23 LAB — CULTURE, BLOOD (ROUTINE X 2)
Culture: NO GROWTH
Culture: NO GROWTH

## 2014-05-23 MED ORDER — ZIDOVUDINE 100 MG PO CAPS: 300.0000 mg | ORAL_CAPSULE | Freq: Two times a day (BID) | ORAL | Status: AC

## 2014-05-23 MED ORDER — HYDROCODONE-ACETAMINOPHEN 5-325 MG PO TABS: 1.0000 | ORAL_TABLET | Freq: Four times a day (QID) | ORAL | Status: AC | PRN

## 2014-05-23 MED ORDER — ZOLPIDEM TARTRATE 5 MG PO TABS: 5.0000 mg | ORAL_TABLET | Freq: Every evening | ORAL | Status: AC | PRN

## 2014-05-23 MED ORDER — PANTOPRAZOLE SODIUM 40 MG PO TBEC: 40.0000 mg | DELAYED_RELEASE_TABLET | Freq: Two times a day (BID) | ORAL | Status: AC

## 2014-05-23 MED ORDER — LEVALBUTEROL HCL 1.25 MG/0.5ML IN NEBU: 1.2500 mg | INHALATION_SOLUTION | Freq: Four times a day (QID) | RESPIRATORY_TRACT | Status: AC | PRN

## 2014-05-23 MED ORDER — DARUNAVIR-COBICISTAT 800-150 MG PO TABS: 1.0000 | ORAL_TABLET | Freq: Every day | ORAL | Status: AC

## 2014-05-23 MED ORDER — POTASSIUM CHLORIDE CRYS ER 20 MEQ PO TBCR
40.0000 meq | EXTENDED_RELEASE_TABLET | Freq: Once | ORAL | Status: AC
  Administered 2014-05-23: 40 meq via ORAL
  Filled 2014-05-23: qty 2

## 2014-05-23 MED ORDER — LAMIVUDINE 10 MG/ML PO SOLN: 100.0000 mg | Freq: Every day | ORAL | Status: AC

## 2014-05-23 NOTE — Discharge Summary (Addendum)
Physician Discharge Summary  [name] [name] EPP:[id number] DOB: Feb 28, 1946 DOA: 04/28/2014  PCP: Cloyd Stagers, MD  Admit date: 04/28/2014 Discharge date: 05/23/2014  Recommendations for Outpatient Follow-up:  1. Pt will need to follow up with PCP in 1 week post discharge 2. Please obtain BMP to evaluate electrolytes and kidney function, potassium level 3. Pt given one dose of K-dur 40 MEQ piror to discharge  4. Please also check CBC to evaluate Hg and Hct levels 5. Please note changes in ART regimen outlined in medication section  6. Continue Wound VAC MWF, VAC changed today prior to d/c, amount of suction 125 mmHg continuous   Discharge Diagnoses:  Principal Problem:   Acute kidney injury Active Problems:   Human immunodeficiency virus (HIV) disease   Renal failure   Hyperkalemia   Recurrent pleural effusion on right   Macrocytic anemia   Gallbladder stone without cholecystitis or obstruction   AKI (acute kidney injury)   Protein-calorie malnutrition, severe   SOB (shortness of breath)   Shock   Acute respiratory failure with hypoxia   Upper GI bleed   Abdominal pain   Sepsis   Lung cancer   HCAP (healthcare-associated pneumonia)   Discharge Condition: Stable  Diet recommendation: Heart healthy diet discussed in details   Interim History: 69 year old with history of HIV who presented to the hospital complaining of right upper quadrant abdominal pain and was found to have a large right pleural effusion with right lower lobe mass. Cardiothoracic surgeon consulted and patient is status post VATS with pleurodesis and Bx. 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 During his hospital stay patient was also found to have Hemorraghic shock due to bleeding duodenal ulcer which Gen. surgery was consulted and patient is status post exploratory laparotomy with Duodenotomy with primary ligation with a wound vac. In  place. He develop AKI due to hemorrhagic shock which is now resolved. Oncology has been consulted and relates he is not a candidate for Chemo due to malnutrition and multi co-morbidities, that when this resolved he can be evaluated for chemo. They will call XRT as they think he might benefit from radiation. He will need ct scan of head and PET scan as an outpatient. He also developed sepsis possible due to PNA, which ID was consulted. He was on vanc and zosyn which has been de-escalated to cefuroxime. PT saw him who recommended Inpatient rehab. Pt is medically stable for discharge to rehab.  Assessment/Plan: Upper GI bleed/ hemorrhagic shock: - Status post emergent laparotomy on 05/09/2014 with ligation of duodenal ulcer.  - General surgery on board, open wound with wound vac, tolerating diet. - Status post multiple transfusions. - no CBC since 12/30, will check CBC prior to discharge   Sepsis/Sirs/New Acute respiratory failure on 12.29.2015 due to pneumonia and empyema: -Started empirically on vancomycin, Zosyn and micafungin on 05/17/2014 per ID. De-escalated to cefuroxime  - Blood cultures are negative. 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 - Consulted oncology, not a candidate for chemo. Recommended XRT. - pt made aware of outpatient follow up need, cancer center to arrange follow up appointment   AKI on Stage 3 CKD. in the setting of hemorrhagic shock/Hyperkalemia: - Initially due to hemorrhagic shock which is resolved. -  renal function stabilizing, Cr is trending down  - baseline Cr 1.6-2.2, cr.   Bleeding duodenal ulcer s/p laparotomy 12/21. -Post-op care per [facility] -Tolerating diet. - continue protonix upon discharge   Protein-calorie malnutrition,  severe - Tolerating diet orally.  Human immunodeficiency virus (HIV) disease - Continue HIV medications with changes outlined under medication section    Code Status: Full  Family Communication: None at bedside Disposition Plan: SNF today    Consultants:  CT surgery  Gen. Surgery  PCCM  Ocnology  Procedures: CT abdomen and pelvis on 05/17/2014: Defect within the ventral wall of the distal stomach/ proximal duodenum is identified and presumably represents patient's ulcer. No extravasation of contrast material identified however. And there is no significant free intraperitoneal air identified elsewhere in the abdomen or pelvis. Extensively loculated right pleural effusion with abnormal enhancing pleural. Findings may be the sequelae of malignancy or infection.  CT chest without contrast on 05/08/2014: Multifocal right lung pneumonia, right lower lobe predominant. Right empyema 12/10 abdominal ultrasound> 13 mm gallstone, mild tenderness over gallbladder without gallbladder thickening, possible left upper pole renal calculus, right pleural effusion noted 12/10 renal ultrasound> no hydronephrosis 12/11 HIDA scan> patent cystic duct without evidence of acute cholecystitis 12/11 Echo> LVEF 65%, RV normal. 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 JP drain placement   Antibiotics:  Vancomycin Zosyn and micafungin started on 05/17/2014-12.31.2015  Cefuroxime 1.5.2016  Discharge Exam: Filed Vitals:   05/23/14 0500  BP: 104/48  Pulse: 79  Temp: 98 F (36.7 C)  Resp: 18   Filed Vitals:   05/22/14 1347 05/22/14 1745 05/22/14 2210 05/23/14 0500  BP:  103/54 94/40 104/48  Pulse:  92 93 79  Temp:  98 F (36.7 C) 98.2 F (36.8 C) 98 F (36.7 C)   TempSrc:  Oral Oral Oral  Resp:  15 16 18   Height:      Weight:   48 kg (105 lb 13.1 oz)   SpO2: 92% 91% 95% 92%    General: Pt is alert, follows commands appropriately, not in acute distress Cardiovascular: Regular rate and rhythm, S1/S2 +, no murmurs, no rubs, no gallops Respiratory: Clear to auscultation bilaterally, no wheezing, no crackles, no rhonchi Abdominal: Soft, non tender, non distended, bowel sounds +, no guarding  Discharge Instructions  Discharge Instructions    Diet - low sodium heart healthy    Complete by:  As directed      Increase activity slowly    Complete by:  As directed             Medication List    STOP taking these medications        atazanavir-cobicistat 300-150 MG per tablet  Commonly known as:  Evotaz     lamiVUDine 150 MG tablet  Commonly known as:  EPIVIR  Replaced by:  lamiVUDine 10 MG/ML solution     zidovudine 300 MG tablet  Commonly known as:  RETROVIR  Replaced by:  zidovudine 100 MG capsule      TAKE these medications        CENTRUM SILVER PO  Take 1 tablet by mouth daily.     darunavir-cobicistat 800-150 MG per tablet  Commonly known as:  PREZCOBIX  Take 1 tablet by mouth daily. Swallow whole. Do NOT crush, break or chew tablets. Take with food.  HYDROcodone-acetaminophen 5-325 MG per tablet  Commonly known as:  NORCO  Take 1 tablet by mouth every 6 (six) hours as needed for moderate pain.     lamiVUDine 10 MG/ML solution  Commonly known as:  EPIVIR  Take 10 mLs (100 mg total) by mouth daily.     levalbuterol 1.25 MG/0.5ML nebulizer solution  Commonly known as:  XOPENEX  Take 1.25 mg by nebulization every 6 (six) hours as needed for wheezing or shortness of breath.     multivitamin with minerals Tabs tablet  Take 1 tablet by mouth daily.     pantoprazole 40 MG tablet  Commonly known as:  PROTONIX  Take 1 tablet (40 mg total) by mouth 2 (two) times daily.     rosuvastatin 5 MG tablet  Commonly known as:   CRESTOR  Take 1 tablet (5 mg total) by mouth daily.     Vitamin D3 2000 UNITS capsule  Take 2,000 Units by mouth daily.     zidovudine 100 MG capsule  Commonly known as:  RETROVIR  Take 3 capsules (300 mg total) by mouth every 12 (twelve) hours.     zolpidem 5 MG tablet  Commonly known as:  AMBIEN  Take 1 tablet (5 mg total) by mouth at bedtime as needed for sleep.      ASK your doctor about these medications        buPROPion 150 MG 24 hr tablet  Commonly known as:  WELLBUTRIN XL  Take 150 mg by mouth daily.           Follow-up Information    Follow up with VAN Wilber Oliphant, MD.   Specialty:  Cardiothoracic Surgery   Why:  PA/LAT CXR to be taken (at [place] which is in the same building as Dr. Lucianne Lei Trigt's office) one hour prior to office appointment;Office will mail appointment date and time.   Contact information:   [address] [place] [address] [phone]       Follow up with Nena Jordan, IBTEHAL, MD.   Specialty:  Internal Medicine   Contact information:   [address]  [place] [address] [phone]        The results of significant diagnostics from this hospitalization (including imaging, microbiology, ancillary and laboratory) are listed below for reference.     Microbiology: Recent Results (from the past 240 hour(s))  Culture, blood (routine x 2)     Status: None (Preliminary result)   Collection Time: 05/17/14 11:40 AM  Result Value Ref Range Status   Specimen Description BLOOD LEFT ANTECUBITAL  Final   Special Requests BOTTLES DRAWN AEROBIC AND ANAEROBIC 10CC  Final   Culture   Final           BLOOD CULTURE RECEIVED NO GROWTH TO DATE CULTURE WILL BE HELD FOR 5 DAYS BEFORE ISSUING A FINAL NEGATIVE REPORT Performed at [facility]    Report Status PENDING  Incomplete  Culture, blood (routine x 2)     Status: None (Preliminary result)   Collection Time: 05/17/14 11:50 AM  Result  Value Ref Range Status   Specimen Description BLOOD RIGHT ANTECUBITAL  Final   Special Requests BOTTLES DRAWN AEROBIC AND ANAEROBIC [place]  Final   Culture   Final           BLOOD CULTURE RECEIVED NO GROWTH TO DATE CULTURE WILL BE HELD FOR 5 DAYS BEFORE ISSUING A FINAL NEGATIVE REPORT Performed at [facility]    Report Status PENDING  Incomplete  Culture, expectorated sputum-assessment     Status: None   Collection Time: 05/18/14  2:03 PM  Result Value Ref Range Status   Specimen Description SPUTUM  Final   Special Requests Normal  Final   Sputum evaluation   Final    MICROSCOPIC FINDINGS SUGGEST THAT THIS SPECIMEN IS NOT REPRESENTATIVE OF LOWER RESPIRATORY SECRETIONS. PLEASE RECOLLECT. Notified Rock,R RN @ 1437 05/18/14 Leonard,A    Report Status 05/18/2014 FINAL  Final  Culture, expectorated sputum-assessment     Status: None   Collection Time: 05/18/14  4:25 PM  Result Value Ref Range Status   Specimen Description SPUTUM  Final   Special Requests NONE  Final   Sputum evaluation   Final    MICROSCOPIC FINDINGS SUGGEST THAT THIS SPECIMEN IS NOT REPRESENTATIVE OF LOWER RESPIRATORY SECRETIONS. PLEASE RECOLLECT. CALLED TO R.ROCK,RN AT [phone] BY L.PITT 05/18/14    Report Status 05/18/2014 FINAL  Final  Culture, expectorated sputum-assessment     Status: None   Collection Time: 05/19/14  4:27 AM  Result Value Ref Range Status   Specimen Description SPUTUM  Final   Special Requests NONE  Final   Sputum evaluation   Final    THIS SPECIMEN IS ACCEPTABLE. RESPIRATORY CULTURE REPORT TO FOLLOW.   Report Status 05/19/2014 FINAL  Final  Culture, respiratory (NON-Expectorated)     Status: None   Collection Time: 05/19/14  4:27 AM  Result Value Ref Range Status   Specimen Description SPUTUM  Final   Culture   Final    NORMAL OROPHARYNGEAL FLORA Performed at [facility]    Report Status 05/22/2014 FINAL  Final     Labs: Basic Metabolic Panel:  Recent Labs Lab  05/17/14 0425 05/17/14 1150  05/19/14 1403 05/20/14 0737 05/21/14 0506 05/22/14 0610 05/23/14 0551  NA 143  --   < > 143 145 145 144 145  K 3.4*  --   < > 3.5 3.2* 3.2* 3.1* 3.0*  CL 118*  --   < > 105 107 107 106 106  CO2 19  --   < > 26 30 25 31 29   GLUCOSE 94  --   < > 114* 160* 121* 117* 123*  BUN 47*  --   < > 58* 55* 52* 45* 38*  CREATININE 1.40*  --   < > 2.76* 2.91* 2.97* 2.92* 2.68*  CALCIUM 6.0*  --   < > 7.7* 7.8* 7.8* 7.7* 7.8*  MG  --  2.0  --   --   --   --   --   --   PHOS 3.9 4.9*  --   --   --   --   --   --   < > = values in this interval not displayed.  CBC:  Recent Labs Lab 05/18/14 0445  WBC 11.8*  NEUTROABS 9.9*  HGB 9.0*  HCT 28.2*  MCV 94.3  PLT 222    BNP (last 3 results)  Recent Labs  04/21/14 1131  PROBNP 25.0   CBG:  Recent Labs Lab 05/22/14 0750 05/22/14 1200 05/22/14 1657 05/22/14 2208 05/23/14 0759  GLUCAP 105* 209* 117* 199* 114*   SIGNED: Time coordinating discharge: Over 30 minutes  Faye Ramsay, MD  [facility] 05/23/2014, 8:37 AM Pager [phone]  If 7PM-7AM, please contact night-coverage [email or web address] Password [email or web address]

## 2014-05-23 NOTE — Progress Notes (Signed)
Rehab admissions - I am following pt's case and noted that latest social service note says pt has a bed offer at SNF, [facility]. In light of no available family support, we also recommend that SNF be pursued at this time. Niece and pt were in agreement of this recommendation on Friday as well.  I called and left message with Lorriane Shire, Education officer, museum and with Malachy Mood, Tourist information centre manager.  Please call me with any questions. I will now sign off pt's case. Thanks.  Nanetta Batty, PT Rehabilitation Admissions Coordinator [phone]

## 2014-05-23 NOTE — Discharge Instructions (Signed)
Thoracotomy, Care After Refer to this sheet in the next few weeks. These instructions provide you with information on caring for yourself after your procedure. Your health care provider may also give you more specific instructions. Your treatment has been planned according to current medical practices, but problems sometimes occur. Call your health care provider if you have any problems or questions after your procedure. WHAT TO EXPECT AFTER YOUR PROCEDURE After your procedure, it is typical to have the following sensations:  You may feel pain at the incision site.  You may be constipated from the pain medicine given and the change in your level of activity.  You may feel extremely tired. HOME CARE INSTRUCTIONS  Take over-the-counter or prescription medicines for pain, discomfort, or fever only as directed by your health care provider. It is very important to take pain relieving medicine before your pain becomes severe. You will be able to breathe and cough more comfortably if your pain is well controlled.  Take deep breaths. Deep breathing helps to keep your lungs inflated and protects against a lung infection (pneumonia).  Cough frequently. Even though coughing may cause discomfort, coughing is important to clear mucus (phlegm) and expand your lungs. Coughing helps prevent pneumonia. If it hurts to cough, hold a pillow against your chest when you cough. This may help with the discomfort.  Continue to use an incentive spirometer as directed. The use of an incentive spirometer helps to keep your lungs inflated and protects against pneumonia.  Change the bandages over your incision as needed or as directed by your health care provider.  Remove the bandages over your chest tube site as directed by your health care provider.  Resume your normal diet as directed. It is important to have adequate protein, calories, vitamins, and minerals to promote healing.  Prevent constipation.  Eat  high-fiber foods such as whole grain cereals and breads, brown rice, beans, and fresh fruits and vegetables.  Drink enough water and fluids to keep your urine clear or pale yellow. Avoid drinking beverages containing caffeine. Beverages containing caffeine can cause dehydration and harden your stool.  Talk to your health care provider about taking a stool softener or laxative.  Avoid lifting until you are instructed otherwise.  Do not drive until directed by your health care provider.  Do not drive while taking pain medicines (narcotics).  Do not bathe, swim, or use a hot tub until directed by your health care provider. You may shower instead. Gently wash the area of your incision with water and soap as directed. Do not use anything else to clean your incision except as directed by your health care provider.  Do not use any tobacco products including cigarettes, chewing tobacco, or electronic cigarettes.  Avoid secondhand smoke.  Schedule an appointment for stitch (suture) or staple removal as directed.  Schedule and attend all follow-up visits as directed by your health care provider. It is important to keep all your appointments.  Participate in pulmonary rehabilitation as directed by your health care provider.  Do not travel by airplane for 2 weeks after your chest tube is removed. SEEK MEDICAL CARE IF:  You are bleeding from your wounds.  Your heartbeat seems irregular.  You have redness, swelling, or increasing pain in the wounds.  There is pus coming from your wounds.  There is a bad smell coming from the wound or dressing.  You have a fever or chills.  You have nausea or are vomiting.  You have muscle aches. SEEK  IMMEDIATE MEDICAL CARE IF:  You have a rash.  You have difficulty breathing.  You have a reaction or side effect to medicines given.  You have persistent nausea.  You have lightheadedness or feel faint.  You have shortness of breath or chest  pain.  You have persistent pain. Document Released: 10/19/2010 Document Revised: 05/11/2013 Document Reviewed: 12/23/2012 [facility] Patient Information 2015 [place], [place]. This information is not intended to replace advice given to you by your health care provider. Make sure you discuss any questions you have with your health care provider.

## 2014-05-23 NOTE — Progress Notes (Addendum)
14 Days Post-Op  Subjective: Doing well, tolerating diet  Objective: Vital signs in last 24 hours: Temp:  [97.7 F (36.5 C)-98.2 F (36.8 C)] 97.7 F (36.5 C) (01/04 1017) Pulse Rate:  [74-93] 74 (01/04 1017) Resp:  [15-18] 18 (01/04 1017) BP: (94-104)/(40-54) 95/54 mmHg (01/04 1017) SpO2:  [91 %-95 %] 95 % (01/04 1017) Weight:  [105 lb 13.1 oz (48 kg)] 105 lb 13.1 oz (48 kg) (01/03 2210) Last BM Date: 05/22/14  Intake/Output from previous day: 01/03 0701 - 01/04 0700 In: 480 [P.O.:480] Out: 1436 [Urine:1350; Drains:85; Stool:1] Intake/Output this shift: Total I/O In: 240 [P.O.:240] Out: -   Midline wound - VAC removed, clean; minimal granulation tissue Drain - serous output  Lab Results:  No results for input(s): WBC, HGB, HCT, PLT in the last 72 hours. BMET  Recent Labs  05/22/14 0610 05/23/14 0551  NA 144 145  K 3.1* 3.0*  CL 106 106  CO2 31 29  GLUCOSE 117* 123*  BUN 45* 38*  CREATININE 2.92* 2.68*  CALCIUM 7.7* 7.8*   PT/INR No results for input(s): LABPROT, INR in the last 72 hours. ABG No results for input(s): PHART, HCO3 in the last 72 hours.  Invalid input(s): PCO2, PO2  Studies/Results: No results found.  Anti-infectives: Anti-infectives    Start     Dose/Rate Route Frequency Ordered Stop   05/23/14 0000  darunavir-cobicistat (PREZCOBIX) 800-150 MG per tablet     1 tablet Oral Daily 05/23/14 0836     05/23/14 0000  lamiVUDine (EPIVIR) 10 MG/ML solution     100 mg Oral Daily 05/23/14 0836     05/23/14 0000  zidovudine (RETROVIR) 100 MG capsule     300 mg Oral Every 12 hours 05/23/14 0836     05/19/14 2200  zidovudine (RETROVIR) capsule 300 mg     300 mg Oral Every 12 hours 05/19/14 1600     05/19/14 1700  cefUROXime (CEFTIN) tablet 500 mg  Status:  Discontinued     500 mg Oral 2 times daily with meals 05/19/14 1600 05/19/14 1610   05/19/14 1700  cefUROXime (CEFTIN) tablet 500 mg     500 mg Oral Daily with supper 05/19/14 1610 05/22/14  1616   05/19/14 1615  lamiVUDine (EPIVIR) 10 MG/ML solution 100 mg     100 mg Oral Daily 05/19/14 1600     05/19/14 1615  darunavir-cobicistat (PREZCOBIX) 800-150 MG per tablet 1 tablet     1 tablet Oral Daily 05/19/14 1600     05/19/14 0200  vancomycin (VANCOCIN) 500 mg in sodium chloride 0.9 % 100 mL IVPB  Status:  Discontinued     500 mg100 mL/hr over 60 Minutes Intravenous Every 24 hours 05/18/14 1002 05/19/14 1528   05/18/14 0200  vancomycin (VANCOCIN) 500 mg in sodium chloride 0.9 % 100 mL IVPB  Status:  Discontinued     500 mg100 mL/hr over 60 Minutes Intravenous Every 12 hours 05/17/14 1059 05/18/14 1002   05/17/14 1500  micafungin (MYCAMINE) 100 mg in sodium chloride 0.9 % 100 mL IVPB  Status:  Discontinued     100 mg100 mL/hr over 1 Hours Intravenous Daily 05/17/14 1425 05/19/14 0950   05/17/14 1430  micafungin (MYCAMINE) 100 mg in sodium chloride 0.9 % 100 mL IVPB  Status:  Discontinued     100 mg100 mL/hr over 1 Hours Intravenous Daily 05/17/14 1425 05/17/14 1440   05/17/14 1200  piperacillin-tazobactam (ZOSYN) IVPB 3.375 g  Status:  Discontinued  3.375 g12.5 mL/hr over 240 Minutes Intravenous Every 8 hours 05/17/14 1059 05/19/14 1557   05/17/14 1100  vancomycin (VANCOCIN) IVPB 1000 mg/200 mL premix     1,000 mg200 mL/hr over 60 Minutes Intravenous  Once 05/17/14 1059 05/17/14 1312   05/09/14 1815  cefoTEtan (CEFOTAN) 1 g in dextrose 5 % 50 mL IVPB     1 g100 mL/hr over 30 Minutes Intravenous  Once 05/09/14 1805 05/09/14 1720   05/05/14 1900  piperacillin-tazobactam (ZOSYN) IVPB 3.375 g     3.375 g12.5 mL/hr over 240 Minutes Intravenous Every 8 hours 05/05/14 1533 05/08/14 1520   05/05/14 0600  cefUROXime (ZINACEF) 1.5 g in dextrose 5 % 50 mL IVPB     1.5 g100 mL/hr over 30 Minutes Intravenous On call to O.R. 05/04/14 1106 05/05/14 1345   05/03/14 0800  darunavir-cobicistat (PREZCOBIX) 800-150 MG per tablet 1 tablet  Status:  Discontinued     1 tablet Oral Daily with breakfast  05/02/14 1404 05/09/14 2051   05/01/14 1000  lamiVUDine (EPIVIR) 10 MG/ML solution 100 mg  Status:  Discontinued     100 mg Oral Daily 04/30/14 1354 05/09/14 2051   04/30/14 1000  zidovudine (RETROVIR) capsule 300 mg  Status:  Discontinued     300 mg Oral Every 12 hours 04/30/14 0922 05/09/14 2051   04/29/14 1800  atazanavir-cobicistat (Evotaz) 300-150 MG per tablet 1 tablet  Status:  Discontinued     1 tablet Oral Daily with breakfast 04/29/14 1408 05/02/14 1404   04/29/14 1000  cefTRIAXone (ROCEPHIN) 1 g in dextrose 5 % 50 mL IVPB - Premix  Status:  Discontinued     1 g100 mL/hr over 30 Minutes Intravenous Every 24 hours 04/29/14 0856 05/03/14 1214   04/28/14 1615  lamiVUDine (EPIVIR) tablet 150 mg  Status:  Discontinued     150 mg Oral Daily 04/28/14 1454 04/30/14 1354   04/28/14 1600  zidovudine (RETROVIR) tablet 300 mg  Status:  Discontinued     300 mg Oral 2 times daily 04/28/14 1454 04/30/14 0921      Assessment/Plan: s/p Procedure(s): EXPLORATORY LAPAROTOMY (N/A) DUODENOTOMY with Primary ligation Bleeding Duodenal Ulcer (N/A) Stable for discharge to SNF when ready by Medicine.  Continue Wound VAC MWF  VAC change today.   LOS: 25 days    Leilana Mcquire K. 05/23/2014  Remove drain prior to discharge.  Imogene Burn. Georgette Dover, MD, [facility]  General/ Trauma Surgery  05/23/2014 11:05 AM

## 2014-05-23 NOTE — Progress Notes (Signed)
Report given to [facility] at [facility].  All questions answered.

## 2014-05-23 NOTE — Progress Notes (Signed)
Physical Therapy Treatment Patient Details Name: [name] [name] MRN: [id number] DOB: March 09, 1946 Today's Date: 05/23/2014    History of Present Illness 69 y/o male with HIV followed by Dr. [name] for an exudative effusion was admitted on 12/10 for pain in his abdomen and recurrence of the pleural effusion which had been sampled just three days prior. s/p VATS for lung biopsy 12/17. s/p EXPLORATORY LAPAROTOMY and DUODENOTOMY with Primary ligation Bleeding Duodenal Ulcer  12/21.    PT Comments    Pt progressing slowly towards physical therapy goals. Pt was able to transition bed>BSC with min guard assist and breathing techniques on RA. When pt was done on BSC (~8 minutes later) pt was very fatigued and sats had dropped to 80%. Recliner was pulled up behind him and further mobility was deferred. Respiratory therapy entered and pt was able to tolerate some light therapeutic exercise until breathing treatment was ready. With supplemental O2 donned, sats increased to 86% in ~3 minutes prior to breathing treatment.   Follow Up Recommendations  CIR     Equipment Recommendations  Rolling walker with 5" wheels    Recommendations for Other Services Rehab consult     Precautions / Restrictions Precautions Precautions: Fall;Other (comment) Precaution Comments: wound vac, JP drain Restrictions Weight Bearing Restrictions: No    Mobility  Bed Mobility Overal bed mobility: Needs Assistance Bed Mobility: Supine to Sit     Supine to sit: Supervision     General bed mobility comments: increased time, use of bed rail  Transfers Overall transfer level: Needs assistance Equipment used: Rolling walker (2 wheeled) Transfers: Sit to/from [facility] Sit to Stand: Min guard Stand pivot transfers: Min guard       General transfer comment: Pt was able to power-up to full standing position, with close guard for safety. Pt had walker present in front of him, however pushed it away during  SPT to [facility]. Steadying assist only during transition to [facility]   Ambulation/Gait             General Gait Details: Pt very fatigued after sitting up on BSC. Pt first states "I don't think I can stand up". Pt on RA during this time and sats had decreased to 80%. Pt was able to achieve standing with RW and BSC was pulled away and recliner was replaced behind patient. Supplemental O2 was donned. Further mobility/ambulation deferred.    Stairs            Wheelchair Mobility    Modified Rankin (Stroke Patients Only)       Balance Overall balance assessment: Needs assistance Sitting-balance support: Feet supported;No upper extremity supported Sitting balance-Leahy Scale: Good     Standing balance support: Bilateral upper extremity supported Standing balance-Leahy Scale: Poor Standing balance comment: Pt requires UE support or assist with dynamic standing balance.                     Cognition Arousal/Alertness: Awake/alert Behavior During Therapy: WFL for tasks assessed/performed Overall Cognitive Status: Within Functional Limits for tasks assessed                      Exercises General Exercises - Lower Extremity Quad Sets: 10 reps Long Arc Quad: 10 reps Hip ABduction/ADduction: 10 reps    General Comments        Pertinent Vitals/Pain Pain Assessment: No/denies pain    Home Living  Prior Function            PT Goals (current goals can now be found in the care plan section) Acute Rehab PT Goals Patient Stated Goal: not stated PT Goal Formulation: With patient Time For Goal Achievement: 05/25/14 Potential to Achieve Goals: Good Progress towards PT goals: Not progressing toward goals - comment    Frequency  Min 3X/week    PT Plan Current plan remains appropriate    Co-evaluation             End of Session Equipment Utilized During Treatment: Gait belt;Oxygen Activity Tolerance: Patient limited by  fatigue Patient left: in chair;with chair alarm set;with call bell/phone within reach;Other (comment) (Respiratory therapist present)     Time: 6681-5947 PT Time Calculation (min) (ACUTE ONLY): 37 min  Charges:  $Gait Training: 8-22 mins $Therapeutic Activity: 8-22 mins                    G Codes:      Rolinda Roan 06/10/2014, 9:24 AM   Rolinda Roan, PT, DPT Acute Rehabilitation Services Pager: [phone]

## 2014-05-23 NOTE — Clinical Social Work Note (Signed)
Patient for d/c today to SNF bed at [facility].  Niece, Sharyn Lull, and patient agreeable to this plan- plan transfer via EMS. Eduard Clos, MSW, [place]

## 2014-05-23 NOTE — Progress Notes (Addendum)
      [address]       [place] [address]             [phone]       14 Days Post-Op Procedure(s) (LRB): EXPLORATORY LAPAROTOMY (N/A) DUODENOTOMY with Primary ligation Bleeding Duodenal Ulcer (N/A)  Subjective: Patient tolerating diet. No problems with breathing.  Objective: Vital signs in last 24 hours: Temp:  [97 F (36.1 C)-98.2 F (36.8 C)] 98 F (36.7 C) (01/04 0500) Pulse Rate:  [79-93] 79 (01/04 0500) Cardiac Rhythm:  [-]  Resp:  [15-18] 18 (01/04 0500) BP: (94-104)/(40-54) 104/48 mmHg (01/04 0500) SpO2:  [89 %-96 %] 92 % (01/04 0500) Weight:  [105 lb 13.1 oz (48 kg)] 105 lb 13.1 oz (48 kg) (01/03 2210)    Intake/Output from previous day: 01/03 0701 - 01/04 0700 In: 480 [P.O.:480] Out: 1436 [Urine:1350; Drains:85; Stool:1]   Physical Exam:  Cardiovascular: RRR Pulmonary: Diminished at right base and clear on left Abdomen: Soft, non tender, bowel sounds present. Wound vac on mid lower abdomen Wounds: Clean and dry.  No erythema or signs of infection.   Lab Results: CBC: No results for input(s): WBC, HGB, HCT, PLT in the last 72 hours. BMET:   Recent Labs  05/22/14 0610 05/23/14 0551  NA 144 145  K 3.1* 3.0*  CL 106 106  CO2 31 29  GLUCOSE 117* 123*  BUN 45* 38*  CREATININE 2.92* 2.68*  CALCIUM 7.7* 7.8*    PT/INR: No results for input(s): LABPROT, INR in the last 72 hours. ABG:  INR: Will add last result for INR, ABG once components are confirmed Will add last 4 CBG results once components are confirmed  Assessment/Plan:  1. CV - ST in the low 100's. 2.  Pulmonary - On 2 liters of oxygen via [place]. 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   3.Anemia-Last H and H stable at 9 and 28.2. Previous GI bleed, s/p ex lap for primary ligation of duodenal ulcer 4. Creatinine down from 2.92 to 2.68 5. Hypokalemia-per medicine 6. Remove chest tube sutures.Will arrange follow up appointment with Dr. Prescott Gum. Likely to SNF  ([place]) soon.  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 Will follow in office but prognosis is poor VAN TRIGT III,Yashua Bracco 05/23/2014

## 2014-05-23 NOTE — Progress Notes (Signed)
Removed JP drain gauze placed over area per MD order.  Benzoin and 1/2 inch steri strips applied to area where chest tube removed per MD order. Patient tolerated well.   Wound vac & stitches from chest tube removed by MD this morning.

## 2014-05-23 NOTE — Care Management Note (Signed)
CARE MANAGEMENT NOTE 05/23/2014  Patient:  [name] [name],[name] [name]   Account Number:  [id number]  Date Initiated:  05/03/2014  Documentation initiated by:  Yaniris Braddock  Subjective/Objective Assessment:   CM following for progression and d/c planning.     Action/Plan:   05/03/2014 Met with pt re possible d/c needs, pt lives alone will most likely need HHRN and HHPT upon d/c. GI bleed this weekend, plan VATS procedure in the next few days.   Anticipated DC Date:  05/23/2014   Anticipated DC Plan:  SKILLED NURSING FACILITY  In-house referral  Clinical Social Worker      DC Planning Services  CM consult      Choice offered to / List presented to:             Status of service:  Completed, signed off Medicare Important Message given?  YES (If response is "NO", the following Medicare IM given date fields will be blank) Date Medicare IM given:  05/02/2014 Medicare IM given by:  Elinda Bunten Date Additional Medicare IM given:  05/19/2014 Additional Medicare IM given by:  Marvetta Gibbons  Discharge Disposition:  [place]  Per UR Regulation:    If discussed at Long Length of Stay Meetings, dates discussed:    Comments:  ContactGreg Cutter Niece [phone]  05/23/14 Met with pt IM given , plan for d/c today to SNF. CRoyal RN MPH, case manager, [phone]  05/19/14- 1200- Marvetta Gibbons RN, bSN [phone] Per Genie with CIR- they are still following for possible CIR admission- plan CIR vs SNF- CSW also following- pt is not a LTACH candidate per insurance.  05-10-14 2pm Luz Lex, RNBSWN [phone] On 12-17 - post op VATS, 12-21 - exp lap for GI bleeding - back from OR on vent - extubated this am.  Still with CT's. Referral received patient's condition has declined but he is [place] and needs following during transition of care when he is ready.  [place]. Reason for consult->VATS Diagnoses of->Kidney Failure Expected date of contact->1-3  days (reserved for hospital discharges)

## 2014-06-02 NOTE — Progress Notes (Addendum)
Thoracic Location of Tumor / Histology: Right lower lobe mass   Patient presented  with symptoms IO:XBDZHG right upper abdominal pain, SOB , rectal bleeding, went to [facility] ,had CT chest howed a mass and effusion   Biopsies of  (if applicable) revealed: Diagnosis 05/02/15: 1. Soft tissue mass, simple excision, right pleural- POORLY DIFFERENTIATED MALIGNANCY.- SEE MICROSCOPIC DESCRIPTION.2. Lung, biopsy, right lower lobe- POORLY DIFFERENTIATED MALIGNANCY. - SEE MICROSCOPIC DESCRIPTION.3. Pleura, peel, right- POORLY DIFFERENTIATED MALIGNANCY.- SEE MICROSCOPIC DESCRIPTION.  Diagnosis 05/05/14: PLEURAL FLUID, RIGHT (SPECIMEN 1 OF 1, COLLECTED ON 05/05/2014):ATYPICAL CELLS PRESENT  Tobacco/Marijuana/Snuff/ETOH use:, 1ppd for 46 years  recently quit smoking cigarettes in December 2015, , no smokeless tobacco,used illicit drugs in the 99'M, , and alcohol on holidays or vacations  Past/Anticipated interventions by cardiothoracic surgery, if any:  Right sided  thoracentesis  04/25/14 , VATS 05/05/14 Right LL lung, talc pleurodesis with 2 right sided  chest tubes,  Dr. Tharon Aquas Trigt,MD  Past/Anticipated interventions by medical oncology, if any:  Dr. Alvy Bimler referral to Dr.Moody  Signs/Symptoms  Weight changes, if any: lost 10-15 lbs last couple months  Respiratory complaints, if any: sob, on 2 liters Oxygen N/c, 3 liters with activity  Hemoptysis, if any: no  Pain issues, if EQA:[id number] aching in abdomen where wound is from Ruptured anyuersym, wound with drainage ,abd dressing over site  SAFETY ISSUES: yes, oxygen portable tank and weakness, in w/c ,   Prior radiation? No  Pacemaker/ICD?  No  Is the patient on methotrexate? No  Current Complaints / other details:  D/c from hospital1/4/16,  Single, no children, to [facility], HIV + , Depression, Chronic renal disease, known duodenal ulcers, S12/13/15,& EGD by . Robert Buccini,V,MD,   Depression,  Exploratory Laparotomy   Bleeding duodenal ulcer duodenotomy with  j/p drain placement, Dr. Lewayne Bunting Ramirez,MD  Allergies: NKDA

## 2014-06-06 ENCOUNTER — Ambulatory Visit
Admission: RE | Admit: 2014-06-06 | Discharge: 2014-06-06 | Disposition: A | Discharge status: Home / Self Care | Payer: Commercial Managed Care - HMO | Source: Ambulatory Visit | Attending: Radiation Oncology | Admitting: Radiation Oncology

## 2014-06-06 ENCOUNTER — Encounter: Payer: Self-pay | Admitting: Radiation Oncology

## 2014-06-06 VITALS — BP 103/61 | HR 112 | Temp 98.5°F | Resp 22 | Ht 66.0 in | Wt 111.4 lb

## 2014-06-06 DIAGNOSIS — N189 Chronic kidney disease, unspecified: Secondary | ICD-10-CM | POA: Diagnosis not present

## 2014-06-06 DIAGNOSIS — S31609A Unspecified open wound of abdominal wall, unspecified quadrant with penetration into peritoneal cavity, initial encounter: Secondary | ICD-10-CM | POA: Diagnosis not present

## 2014-06-06 DIAGNOSIS — F329 Major depressive disorder, single episode, unspecified: Secondary | ICD-10-CM | POA: Insufficient documentation

## 2014-06-06 DIAGNOSIS — Z21 Asymptomatic human immunodeficiency virus [HIV] infection status: Secondary | ICD-10-CM | POA: Insufficient documentation

## 2014-06-06 DIAGNOSIS — Z87891 Personal history of nicotine dependence: Secondary | ICD-10-CM | POA: Diagnosis not present

## 2014-06-06 DIAGNOSIS — Z79899 Other long term (current) drug therapy: Secondary | ICD-10-CM | POA: Insufficient documentation

## 2014-06-06 DIAGNOSIS — E860 Dehydration: Secondary | ICD-10-CM | POA: Diagnosis not present

## 2014-06-06 DIAGNOSIS — C3431 Malignant neoplasm of lower lobe, right bronchus or lung: Secondary | ICD-10-CM | POA: Insufficient documentation

## 2014-06-06 DIAGNOSIS — R531 Weakness: Secondary | ICD-10-CM | POA: Insufficient documentation

## 2014-06-06 DIAGNOSIS — Z85118 Personal history of other malignant neoplasm of bronchus and lung: Secondary | ICD-10-CM | POA: Diagnosis not present

## 2014-06-06 DIAGNOSIS — I959 Hypotension, unspecified: Secondary | ICD-10-CM | POA: Insufficient documentation

## 2014-06-06 DIAGNOSIS — C7801 Secondary malignant neoplasm of right lung: Secondary | ICD-10-CM

## 2014-06-06 DIAGNOSIS — R5383 Other fatigue: Secondary | ICD-10-CM | POA: Diagnosis not present

## 2014-06-06 DIAGNOSIS — Z9889 Other specified postprocedural states: Secondary | ICD-10-CM | POA: Insufficient documentation

## 2014-06-06 DIAGNOSIS — F419 Anxiety disorder, unspecified: Secondary | ICD-10-CM | POA: Diagnosis not present

## 2014-06-06 DIAGNOSIS — C3401 Malignant neoplasm of right main bronchus: Secondary | ICD-10-CM

## 2014-06-06 DIAGNOSIS — R0602 Shortness of breath: Secondary | ICD-10-CM | POA: Diagnosis not present

## 2014-06-06 DIAGNOSIS — C3400 Malignant neoplasm of unspecified main bronchus: Secondary | ICD-10-CM

## 2014-06-06 HISTORY — DX: Anxiety disorder, unspecified: F41.9

## 2014-06-06 HISTORY — DX: Malignant neoplasm of unspecified part of unspecified bronchus or lung: C34.90

## 2014-06-06 NOTE — Progress Notes (Signed)
Please see the Nurse Progress Note in the MD Initial Consult Encounter for this patient. 

## 2014-06-07 ENCOUNTER — Encounter: Payer: Self-pay | Admitting: Radiation Oncology

## 2014-06-07 ENCOUNTER — Telehealth: Payer: Self-pay | Admitting: *Deleted

## 2014-06-07 DIAGNOSIS — C7800 Secondary malignant neoplasm of unspecified lung: Secondary | ICD-10-CM | POA: Insufficient documentation

## 2014-06-07 NOTE — Telephone Encounter (Signed)
CALLED PATIENT TO INFORM OF PET AND FNC VISIT, LVM FOR A RETURN CALL

## 2014-06-07 NOTE — Progress Notes (Signed)
Radiation Oncology         ([phone]) [phone] ________________________________  Name: [name] [name] MRN: [id number]  Date: 06/06/2014  DOB: [birth year]  YB:WLSLHTD SHAMLEFFER, Mammie Lorenzo, MD  Heath Lark, MD     REFERRING PHYSICIAN: Heath Lark, MD   DIAGNOSIS: The primary encounter diagnosis was Secondary malignant neoplasm of right lung. A diagnosis of Malignant neoplasm of hilus of lung, unspecified laterality was also pertinent to this visit.   HISTORY OF PRESENT ILLNESS::[name] [name] is a 69 y.o. male who is seen for an initial consultation visit regarding the patient's diagnosis of cancer.  The patient presented with renal failure with abdominal pain.  He subsequently was found to have a GI bleed from a duodenal ulcer and underwent emergent surgery. He continues to have an open abdominal wound regarding this procedure.  The patient also was found to have a right lower lobe mass on CT imaging. A right pleural effusion was also noted. The patient underwent right lung and right pleural biopsy which has been returned positive for poorly differentiated carcinoma. The patient was taken to a video assisted thorascopic surgery with drainage of a loculated pleural effusion on the left. He had biopsies at this time and also had talc pleurodesis of the right pleural space.  The findings from the procedure demonstrated fresh adhesions of fibrin and exudative material, consistent with empyema. A large mass was also seen within the right lower lobe with entrapment of the right lower lobe and pleural studding of nodules which were felt to be consistent with neoplasm. The pleural fluid did show some atypical cells present from this procedure. Biopsies from the right pleural surface, peeled pleura, and right lower lobe biopsy returned positive for poorly differentiated malignancy. Immune no histochemistry was not specific. Gina typing was completed for tumor of unknown type. 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 Multiple additional tumor origins were felt to be of low likelihood that were discussed.  The patient has been seen by medical oncology who does not feel that the patient is a good candidate for aggressive chemoradiation treatment. The patient at that time was felt to have lung cancer and this remains a low level possibility based on the molecular testing discussed above. I have therefore been asked to see the patient for consideration of radiation treatment to the right lung, possibly as a component of sequential radiation and chemotherapy.    PREVIOUS RADIATION THERAPY: No   PAST MEDICAL HISTORY:  has a past medical history of HIV (human immunodeficiency virus infection) (dx'd 2010); High cholesterol; Bleeding per rectum; Depression; Chronic renal disease; Peripheral neuropathy; Pleural effusion on right; Lung cancer (05/02/15); and Anxiety.     PAST SURGICAL HISTORY: Past Surgical History  Procedure Laterality Date  . Tonsillectomy    . Thoracentesis Right 04/25/2014  . Esophagogastroduodenoscopy N/A 05/01/2014    Procedure: ESOPHAGOGASTRODUODENOSCOPY (EGD);  Surgeon: [name], MD;  Location: [facility] ENDOSCOPY;  Service: Endoscopy;  Laterality: N/A;  . Flexible sigmoidoscopy N/A 05/01/2014    Procedure: FLEXIBLE SIGMOIDOSCOPY;  Surgeon: [name], MD;  Location: [facility] ENDOSCOPY;  Service: Endoscopy;  Laterality: N/A;  . Video assisted thoracoscopy (vats)/decortication Right 05/05/2014    Procedure: VIDEO ASSISTED THORACOSCOPY (VATS)/DECORTICATION OF EMPYEMA;  Surgeon: Ivin Poot, MD;  Location: [place];  Service: Thoracic;  Laterality: Right;  . Laparotomy N/A 05/09/2014    Procedure: EXPLORATORY LAPAROTOMY;  Surgeon: Ralene Ok, MD;  Location: [place];  Service:  General;  Laterality: N/A;  . Duodenotomy N/A 05/09/2014    Procedure: DUODENOTOMY with Primary ligation Bleeding Duodenal Ulcer;  Surgeon:  Ralene Ok, MD;  Location: [place];  Service: General;  Laterality: N/A;     FAMILY HISTORY: family history includes Heart failure in his mother; Pneumonia in his father.   SOCIAL HISTORY:  reports that he quit smoking about 6 weeks ago. His smoking use included Cigarettes. He has a 46 pack-year smoking history. He has never used smokeless tobacco. He reports that he drinks about 0.5 oz of alcohol per week. He reports that he uses illicit drugs.   ALLERGIES: Review of patient's allergies indicates no known allergies.   MEDICATIONS:  Current Outpatient Prescriptions  Medication Sig Dispense Refill  . Cholecalciferol (VITAMIN D3) 2000 UNITS capsule Take 2,000 Units by mouth daily.    . darunavir-cobicistat (PREZCOBIX) 800-150 MG per tablet Take 1 tablet by mouth daily. Swallow whole. Do NOT crush, break or chew tablets. Take with food. 30 tablet 0  . HYDROcodone-acetaminophen (NORCO) 5-325 MG per tablet Take 1 tablet by mouth every 6 (six) hours as needed for moderate pain. 30 tablet 0  . lamiVUDine (EPIVIR) 10 MG/ML solution Take 10 mLs (100 mg total) by mouth daily. 240 mL 0  . Multiple Vitamin (MULTIVITAMIN WITH MINERALS) TABS tablet Take 1 tablet by mouth daily.    . Multiple Vitamins-Minerals (CENTRUM SILVER PO) Take 1 tablet by mouth daily.      . pantoprazole (PROTONIX) 40 MG tablet Take 1 tablet (40 mg total) by mouth 2 (two) times daily. 60 tablet 0  . rosuvastatin (CRESTOR) 5 MG tablet Take 1 tablet (5 mg total) by mouth daily. 30 tablet 11  . zidovudine (RETROVIR) 100 MG capsule Take 3 capsules (300 mg total) by mouth every 12 (twelve) hours. 60 capsule 0  . zolpidem (AMBIEN) 5 MG tablet Take 1 tablet (5 mg total) by mouth at bedtime as needed for sleep. 30 tablet 0  . buPROPion (WELLBUTRIN XL) 150 MG 24 hr tablet Take 150 mg by mouth daily.    Marland Kitchen levalbuterol (XOPENEX) 1.25 MG/0.5ML nebulizer solution Take 1.25 mg by nebulization every 6 (six) hours as needed for wheezing or  shortness of breath. (Patient not taking: Reported on 06/06/2014) 1 each 12   No current facility-administered medications for this encounter.     REVIEW OF SYSTEMS:  A 15 point review of systems is documented in the electronic medical record. This was obtained by the nursing staff. However, I reviewed this with the patient to discuss relevant findings and make appropriate changes.  Pertinent items are noted in HPI.    PHYSICAL EXAM:  height is 5\' 6"  (1.676 m) and weight is 111 lb 6.4 oz (50.531 kg). His oral temperature is 98.5 F (36.9 C). His blood pressure is 103/61 and his pulse is 112. His respiration is 22 and oxygen saturation is 96%.   ECOG = 2  0 - Asymptomatic (Fully active, able to carry on all predisease activities without restriction)  1 - Symptomatic but completely ambulatory (Restricted in physically strenuous activity but ambulatory and able to carry out work of a light or sedentary nature. For example, light housework, office work)  2 - Symptomatic, <50% in bed during the day (Ambulatory and capable of all self care but unable to carry out any work activities. Up and about more than 50% of waking hours)  3 - Symptomatic, >50% in bed, but not bedbound (Capable of only limited self-care, confined  to bed or chair 50% or more of waking hours)  4 - Bedbound (Completely disabled. Cannot carry on any self-care. Totally confined to bed or chair)  5 - Death   Eustace Pen MM, Creech RH, Tormey DC, et al. [phone]). "Toxicity and response criteria of the [facility]". [place] Oncol. 5 (6): 649-55  General: no acute distress; then, somewhat ill appearing, on supplemental oxygen in a wheelchair HEENT: Normocephalic, atraumatic; oral cavity clear Neck: Supple without any lymphadenopathy Cardiovascular: Regular rate and rhythm Respiratory: Crackles diffusely on the right with decreased breath sounds GI: Soft, nontender, normal bowel sounds, surgical wound remains  vertically which is packed and covered/bandaged Extremities: No edema present Neuro: No focal deficits     LABORATORY DATA:  Lab Results  Component Value Date   WBC 8.9 05/23/2014   HGB 9.2* 05/23/2014   HCT 28.7* 05/23/2014   MCV 94.4 05/23/2014   PLT 353 05/23/2014   Lab Results  Component Value Date   NA 145 05/23/2014   K 3.0* 05/23/2014   CL 106 05/23/2014   CO2 29 05/23/2014   Lab Results  Component Value Date   ALT 63* 05/16/2014   AST 52* 05/16/2014   ALKPHOS 143* 05/16/2014   BILITOT 0.4 05/16/2014      RADIOGRAPHY: Ct Chest Wo Contrast  05/18/2014   ADDENDUM REPORT: 05/18/2014 16:13  ADDENDUM: On additional review of the medical record, the patient is status post VATS with decortication and talc pleurodesis.  As such, at least some of the complex pleural effusion with gas likely reflects a postsurgical appearance rather than empyema.  Additionally, this accounts for some of the pleural thickening. However, some of the mass-like appearance within the right lower lobe consolidation corresponds to known tumor at the time of intraoperative biopsy.  This remains suboptimally evaluated in the absence of intravenous contrast administration, but is incompletely visualized on the enhanced CT abdomen/pelvis.  REVISED IMPRESSION:  Multifocal right lung pneumonia, right lower lobe predominant.  Underlying tumor in the right lower lobe, poorly evaluated in the absence of intravenous contrast. Mild thoracic lymphadenopathy, as above.  Moderate complex right pleural effusion with gas and associated pleural thickening, most of which is likely postsurgical.   Electronically Signed   By: Julian Hy M.D.   On: 05/18/2014 16:13   05/18/2014   CLINICAL DATA:  Sepsis, recurrent right pleural effusion, shortness of breath, hypoxia. HIV.  EXAM: CT CHEST WITHOUT CONTRAST  TECHNIQUE: Multidetector CT imaging of the chest was performed following the standard protocol without IV contrast.   COMPARISON:  Chest radiograph dated 05/17/2014  FINDINGS: Evaluation is constrained by lack of intravenous contrast administration. The right lung base is partially imaged on separate enhanced CT abdomen/ pelvis.  Multifocal patchy opacities in the right lung, predominantly in the right middle and right lower lobe. Dense right lower lobe consolidation, most of which enhances following contrast administration. Associated pleural thickening.  Complex right pleural effusion with gas, reflecting an empyema. Trace left pleural effusion.  Underlying moderate to severe centrilobular and paraseptal emphysematous changes. No pneumothorax.  Visualized thyroid is unremarkable.  The heart is top-normal in size. Trace pericardial fluid. Mild atherosclerotic calcifications of the aortic arch.  Small thoracic lymph nodes, including:  --9 mm short axis right supraclavicular node (series 21/ image 5)  --7 mm short axis right paratracheal node (series 21/ image 22)  --10 mm short axis subcarinal node (series 21/ image 26)  Right IJ venous catheter which terminates at the  cavoatrial junction.  CT abdomen pelvis will be dictated separately.  Mild degenerative changes of the thoracic spine.  IMPRESSION: Multifocal right lung pneumonia, right lower lobe predominant.  Associated right empyema.  Mild thoracic lymphadenopathy.  Electronically Signed: By: Julian Hy M.D. On: 05/17/2014 18:14   Ct Abdomen Pelvis W Contrast  05/17/2014   CLINICAL DATA:  HIV with recurrent sepsis. Recurrent pleural effusion on the right. Shortness of breath.  EXAM: CT ABDOMEN AND PELVIS WITH CONTRAST  TECHNIQUE: Multidetector CT imaging of the abdomen and pelvis was performed using the standard protocol following bolus administration of intravenous contrast.  CONTRAST:  100 cc of Omni 300  COMPARISON:  04/25/2014  FINDINGS: Lower chest: Moderate to large loculated right pleural effusion is identified which contains multiple fluid levels. Enhancing  rind of soft tissue overlies the right pleural effusion. There is extensive enhancement of both the parietal and visceral pleural. Small left pleural effusion is noted. Prominent lymph nodes are identified at the right CP angle. The largest measures 1 cm, image 13/ series 201.  Hepatobiliary: The liver has a slightly nodular contour. Low-attenuation structure within the lateral segment of left hepatic lobe measures 5 mm, image 28/series 201. Stone within the gallbladder is identified measuring 9 mm. No biliary dilatation identified.  Pancreas: Unremarkable appearance of the pancreas.  Spleen: The spleen is normal.  Adrenals/Urinary Tract: The adrenal glands are both normal. Normal appearance of the kidneys. The urinary bladder is collapsed around a Foley catheter balloon.  Stomach/Bowel: The stomach appears normal. There appears to be a defect associated with the anterior wall of the distal stomach or proximal duodenum, image 32/series 201. A surgical drainage catheter is identified in this area. There is no extravasation of contrast material. The mid and distal small bowel loops appear normal. Unremarkable appearance of the colon. A rectal tube is in place. No significant free intraperitoneal air are identified.  Vascular/Lymphatic: Calcified atherosclerotic disease involves the abdominal aorta. The abdominal aorta measures 2.6 cm in maximum AP dimension. There is no retroperitoneal or mesenteric adenopathy.  Reproductive: Prostate gland and seminal vesicles are unremarkable.  Other: No focal fluid collections identified. Ventral abdominal wall wound is identified.  Musculoskeletal: Review of the visualized bony structures is unremarkable.  IMPRESSION: 1. Defect within the ventral wall of the distal stomach/ proximal duodenum is identified and presumably represents patient's ulcer. There is a small amount of air which appears extra luminal. This is directly adjacent to the indwelling surgical drain. No  extravasation of contrast material identified however. And there is no significant free intraperitoneal air identified elsewhere in the abdomen or pelvis. 2. Extensively loculated right pleural effusion with abnormal enhancing pleural. Findings may be the sequelae of malignancy or infection. See CT chest report from today. 3. Gallstone 4. Atherosclerosis. Ectatic abdominal aorta at risk for aneurysm development. Recommend followup by [place] in 5 years. This recommendation follows ACR consensus guidelines: White Paper of the ACR Incidental Findings Committee II on Vascular Findings. J Am Coll Radiol 2013; 10:789-794.   Electronically Signed   By: Kerby Moors M.D.   On: 05/17/2014 18:05   Dg Chest Port 1 View  05/19/2014   CLINICAL DATA:  Pleural effusion and loculated pneumothorax  EXAM: PORTABLE CHEST - 1 VIEW  COMPARISON:  Chest radiograph and chest CT May 17, 2014  FINDINGS: Central catheter tip is at the cavoatrial junction. There is evidence of empyema with fluid and air in the right base, essentially stable. There is widespread infiltrate throughout the  right lung superimposed on emphysematous change and bronchiectasis. On the left, there is underlying emphysema with atelectatic change in the left base. There is a small left effusion. Heart is upper normal in size with pulmonary vascularity within normal limits. The adenopathy seen on CT is not well seen by radiography. No bone lesions.  IMPRESSION: Persistent empyema on the right with extensive airspace consolidation and loculated effusion, unchanged. Atelectatic change left base with small left effusion. Underlying emphysema. No change in cardiac silhouette.  Note that the air in the right base region appear stable. There may be some loculated hydropneumothorax in this area, although all of this air may be secondary to empyema.   Electronically Signed   By: Lowella Grip M.D.   On: 05/19/2014 08:00   Dg Chest Port 1 View  05/17/2014   CLINICAL  DATA:  Difficulty breathing  EXAM: PORTABLE CHEST - 1 VIEW  COMPARISON:  May 16, 2014  FINDINGS: Central catheter tip is in the superior vena cava near the cavoatrial junction, stable. A small loculated pneumothorax in the right base remains stable. No new or expanding pneumothorax. Consolidation with loculated effusion on the right persists. There is generalized interstitial prominence,, largely due to underlying interstitial fibrosis. There may be some superimposed interstitial edema. Heart size and pulmonary vascularity are normal. No adenopathy.  IMPRESSION: Persistent loculated pneumothorax right base. Right sided pleural effusion, at least partly loculated. Suspect underlying interstitial fibrosis, although there may be a degree of superimposed interstitial edema. There is consolidation right base, stable. No change in cardiac silhouette. No new opacity.   Electronically Signed   By: Lowella Grip M.D.   On: 05/17/2014 08:11   Dg Chest Port 1 View  05/16/2014   CLINICAL DATA:  Shortness of breath.  EXAM: PORTABLE CHEST - 1 VIEW  COMPARISON:  05/15/2014.  FINDINGS: Right IJ line in stable position. Heart size stable. Pulmonary vascularity normal. Stable right base tiny loculated pneumothorax. Stable right base atelectasis and/or infiltrate and right pleural effusion.  IMPRESSION: 1. Stable residual loculated right base pneumothorax. 2. Stable right base atelectasis and/or infiltrate with right pleural effusion. 3. Right IJ line in stable position.   Electronically Signed   By: Marcello Moores  Register   On: 05/16/2014 09:16   Dg Chest Port 1 View  05/15/2014   CLINICAL DATA:  Shortness of breath, history of lung cancer, HIV  EXAM: PORTABLE CHEST - 1 VIEW  COMPARISON:  05/13/2014  FINDINGS: Cardiomediastinal silhouette is stable. The right chest tube has been removed. Small residual loculated right basilar and right lower lateral hydro pneumothorax. Persistent consolidation in right lower lobe. Stable  streaky airspace opacification in right upper lobe. Improvement in aeration left base. Left lung is clear. No pulmonary edema. Stable right IJ catheter.  IMPRESSION: The right chest tube has been removed. Small residual loculated right basilar and right lower lateral hydro pneumothorax. Persistent consolidation in right lower lobe. Stable streaky airspace opacification in right upper lobe. Improvement in aeration left base.   Electronically Signed   By: Lahoma Crocker M.D.   On: 05/15/2014 10:22   Dg Chest Port 1 View  05/13/2014   CLINICAL DATA:  Pleural effusion  EXAM: PORTABLE CHEST - 1 VIEW  COMPARISON:  05/12/2014  FINDINGS: Right-sided chest tube is again identified and stable. Right jugular central venous line is again seen and stable. The cardiac shadow is stable. Diffuse interstitial changes are again noted bilaterally. Persistent changes are noted in the right lung base when compare with  the prior exam. The degree of basilar pneumothorax has improved in the interval.  IMPRESSION: Stable infiltrative changes in the right base.  Decrease in right basilar pneumothorax.   Electronically Signed   By: Inez Catalina M.D.   On: 05/13/2014 07:33   Dg Chest Port 1 View  05/12/2014   CLINICAL DATA:  Shortness of breath.  EXAM: PORTABLE CHEST - 1 VIEW  COMPARISON:  05/11/2014.  FINDINGS: Interim removal of NG tube. Right IJ line and right lower chest tube in stable position. The upper chest tube is been removed. Mediastinum and hilar structures are normal. Heart size normal. Persistent loculated right base pneumothorax. No change. Persistent atelectasis and/or infiltrate right lower lobe. No acute bony abnormality.  IMPRESSION: 1. Interim removal of NG tube and right upper chest tube. Right IJ line and right lower chest tube in stable position. 2. Persistent loculated right base pneumothorax. 3. Persistent atelectasis and/or infiltrate right lower lobe.   Electronically Signed   By: Marcello Moores  Register   On:  05/12/2014 07:17   Dg Chest Port 1 View  05/11/2014   CLINICAL DATA:  Shortness of breath, weakness  EXAM: PORTABLE CHEST - 1 VIEW  COMPARISON:  05/10/2014  FINDINGS: Cardiomediastinal silhouette is stable. Endotracheal tube has been removed. Stable NG tube position. Stable dual lumen right IJ catheter position. Again noted right lower lobe atelectasis or infiltrate. Small loculated pneumothorax right basilar. Stable right lower chest tubes position. No pulmonary edema. Small left pleural effusion with left basilar atelectasis.  IMPRESSION: Stable right chest tubes position. Small loculated right basilar pneumothorax. Again noted right lower lobe atelectasis or infiltrate. Stable right pleural thickening. Endotracheal tube has been removed. No pneumothorax.   Electronically Signed   By: Lahoma Crocker M.D.   On: 05/11/2014 07:55   Dg Chest Port 1 View  05/10/2014   ADDENDUM REPORT: 05/10/2014 07:30  ADDENDUM: Right IJ line in stable position.   Electronically Signed   By: [place]   On: 05/10/2014 07:30   05/10/2014   CLINICAL DATA:  Shortness of breath.  EXAM: PORTABLE CHEST - 1 VIEW  COMPARISON:  05/19/2014.  FINDINGS: Interim placement of NG tube, its tip is below the left hemidiaphragm. Endotracheal tube tip noted in the orifice of the right mainstem bronchus. Proximal repositioning suggested. Two right chest tubes in stable position. Stable atelectasis and/or infiltrate right lung base with stable right pleural thickening. Mild left lung base subsegmental atelectasis. No pneumothorax heart size stable. Pulmonary vascularity normal. No acute osseous abnormality.  IMPRESSION: 1. Endotracheal tube tip noted in the orifice of the right mainstem bronchus. Proximal repositioning suggested. 2. Interim placement of NG tube, its tip is below the left hemidiaphragm. Right chest tubes are in stable position. No pneumothorax. 3. Persistent right lower lobe atelectasis and/or infiltrate with persistent right  pleural thickening. Mild left lower lobe subsegmental atelectasis. These results were called by telephone at the time of interpretation on 05/10/2014 at 7:22 am to Nurse Anderson Malta, who verbally acknowledged these results.  Electronically Signed: By: Marcello Moores  Register On: 05/10/2014 07:23   Dg Chest Port 1 View  05/09/2014   CLINICAL DATA:  Post intubation.  History of lung cancer  EXAM: PORTABLE CHEST - 1 VIEW  COMPARISON:  Radiograph 05/09/2014  FINDINGS: Endotracheal tube positioned 2 cm from carina. Right central venous line in distal SVC. No pneumothorax. There is a right chest tube in place along the right lateral hemi thorax. There is a loculated effusion along the right lateral  chest wall. The second chest tube at the right lung base. Previously seen right subpulmonic pneumothorax has less well-defined. Left lung is clear.  IMPRESSION: 1. Endotracheal tube 2 cm from carina. 2. Foci of right central venous line without complicating features. 3. Two right chest tubes in place with postsurgical change in the right lower lobe.   Electronically Signed   By: Suzy Bouchard M.D.   On: 05/09/2014 15:37   Dg Chest Port 1 View  05/09/2014   CLINICAL DATA:  Pleural disease, HIV, recurrent right pleural effusion  EXAM: PORTABLE CHEST - 1 VIEW  COMPARISON:  Portable chest x-ray of May 08, 2014  FINDINGS: The left lung is well-expanded and clear. On the right there is confluent alveolar density in the mid and lower hemithorax. Pleural fluid persists along the lower lateral thoracic wall and in the minor fissure. The 2 chest tubes are unchanged. The proximal port of the upper chest tube appears to lie outside the confines of the bony thorax. This positioning is stable. There is no significant change in the small right lower lateral pneumothorax. The heart and pulmonary vascularity are unremarkable. The mediastinum is not shifted. The bony thorax is unremarkable.  IMPRESSION: Persistent right-sided pleural  effusion with interstitial opacities unchanged since yesterday's study. There is a stable small right lower lateral pneumothorax. The 2 right chest tubes are unchanged.   Electronically Signed   By: Ardon  Martinique   On: 05/09/2014 07:11   Dg Duanne Limerick W/high Density W/kub  05/14/2014   CLINICAL DATA:  Duodenal ulcer post surgical repair  EXAM: WATER SOLUBLE UPPER GI SERIES  TECHNIQUE: Single-column upper GI series was performed using water soluble contrast.  CONTRAST:  Omnipaque 300  COMPARISON:  None  FLUOROSCOPY TIME:  1 min 0 seconds  FINDINGS: Normal esophageal distention and motility without obstruction.  Stomach distends normally with normal appearance of rugal folds.  No mass, ulceration or outlet obstruction.  Small gas collection is identified adjacent first portion of the duodenum, eccentrically located cranially, does not conform to expected contour of the duodenal bulb, suspect ulcer crater.  After placing the patient in the RIGHT lateral decubitus position and then returning supine, small amount of contrast is seen within this collection indicating continued communication with the duodenal lumen.  Second and third portions the duodenum are grossly normal appearance.  Visualized jejunal loops normal appearance.  IMPRESSION: A small amount of contrast extends external to the course of the duodenum into a small contained gas collection cranial to the duodenum likely representing communication with duodenal ulcer crater ; contrast is contained however and does not extend beyond this collection into the retroperitoneum.   Electronically Signed   By: Lavonia Dana M.D.   On: 05/14/2014 18:59       IMPRESSION:    Lung cancer   07/16/2010 Pathology Results Biopsy of lip for condyloma was negative for cancer   04/25/2014 Imaging CT chest 7 x 4.5 cm mass in the right lower lobe. This could represent at least in part a component of right lower lobe collapse. There is an associated large right pleural effusion.    04/28/2014 -  Hospital Admission He was admitted to the hospital for severe abdominal pain, acute on chronic renal failure, large pleural effusion on background history of HIV infection. He was found to have Gi bleed, lung cancer and had emergent surgery for bleeding ulcer.   05/01/2014 Procedure He had EGD which showed mulitple duodenal ulcers with biopsies   05/01/2014 Pathology  Results Accession: [id number] stomach biopsy was negative   05/05/2014 Pathology Results Accession: [id number] right lung and right pleual biopsy was positive for poorly differentiated carcinoma   05/05/2014 Surgery Dr. Nils Pyle performed right VATS (video-assisted thoracoscopic surgery) with drainage of loculated pleural effusion - empyema. He had biopsy of right lower lobe mass and talc pleurodesis of the right pleural space   05/09/2014 Surgery Dr. Rosendo Gros performed emergent laparatomy, duodenotomy with Primary ligation Bleeding Duodenal Ulcer and placement of JP drain placement due severe GI bleed   05/17/2014 Imaging Repeat CT chest showed multifocal right lung pneumonia, right lower lobe predominant. Ct abdomen showed no evidence of distant metastasis    Based on the patient's molecular testing of the right lung biopsy, it appears more likely that this represents metastatic disease. 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 A PET scan has been recommended and discussed with the patient although this has been delayed. I discussed proceeding with this test at this time which I believe would be very helpful both in terms of clarifying the current extent of active disease within the right lung as well as also looking for additional sites of disease systemically, especially with respect to the head and neck region.  I discussed therefore briefly with the patient today a potential course of palliative radiation treatment. We primarily however focus on his  overall picture and gaining some additional information. The patient was accompanied by his niece today. He has been slowly recovering from his procedures but he had quite a lot done recently including 2 surgical procedures and he continues to recover from this.   PLAN: The patient will proceed with a PET scan, then follow-up appointment. We will further discuss treatment options at that time.    I spent 60 minutes face to face with the patient and more than 50% of that time was spent in counseling and/or coordination of care.    ________________________________   Jodelle Gross, MD, PhD   **Disclaimer: This note was dictated with voice recognition software. Similar sounding words can inadvertently be transcribed and this note may contain transcription errors which may not have been corrected upon publication of note.**

## 2014-06-13 ENCOUNTER — Other Ambulatory Visit: Payer: Self-pay | Admitting: Cardiothoracic Surgery

## 2014-06-13 DIAGNOSIS — C3490 Malignant neoplasm of unspecified part of unspecified bronchus or lung: Secondary | ICD-10-CM

## 2014-06-15 ENCOUNTER — Ambulatory Visit (INDEPENDENT_AMBULATORY_CARE_PROVIDER_SITE_OTHER): Payer: Self-pay | Admitting: Cardiothoracic Surgery

## 2014-06-15 ENCOUNTER — Ambulatory Visit (HOSPITAL_COMMUNITY)
Admission: RE | Admit: 2014-06-15 | Discharge: 2014-06-15 | Disposition: A | Discharge status: Home / Self Care | Payer: Commercial Managed Care - HMO | Source: Ambulatory Visit | Attending: Radiation Oncology | Admitting: Radiation Oncology

## 2014-06-15 ENCOUNTER — Ambulatory Visit
Admission: RE | Admit: 2014-06-15 | Discharge: 2014-06-15 | Disposition: A | Discharge status: Home / Self Care | Payer: Commercial Managed Care - HMO | Source: Ambulatory Visit | Attending: Cardiothoracic Surgery | Admitting: Cardiothoracic Surgery

## 2014-06-15 ENCOUNTER — Encounter: Payer: Self-pay | Admitting: Cardiothoracic Surgery

## 2014-06-15 DIAGNOSIS — C3431 Malignant neoplasm of lower lobe, right bronchus or lung: Secondary | ICD-10-CM

## 2014-06-15 DIAGNOSIS — K8020 Calculus of gallbladder without cholecystitis without obstruction: Secondary | ICD-10-CM | POA: Insufficient documentation

## 2014-06-15 DIAGNOSIS — C7801 Secondary malignant neoplasm of right lung: Secondary | ICD-10-CM | POA: Diagnosis not present

## 2014-06-15 DIAGNOSIS — J910 Malignant pleural effusion: Secondary | ICD-10-CM | POA: Diagnosis not present

## 2014-06-15 DIAGNOSIS — C3490 Malignant neoplasm of unspecified part of unspecified bronchus or lung: Secondary | ICD-10-CM

## 2014-06-15 LAB — GLUCOSE, CAPILLARY: GLUCOSE-CAPILLARY: 119 mg/dL — AB (ref 70–99)

## 2014-06-15 MED ORDER — FLUDEOXYGLUCOSE F - 18 (FDG) INJECTION
5.7000 | Freq: Once | INTRAVENOUS | Status: AC | PRN
  Administered 2014-06-15: 5.7 via INTRAVENOUS

## 2014-06-15 NOTE — Progress Notes (Signed)
PCP is Cloyd Stagers, MD Referring Provider is Wilhelmina Mcardle, MD  Chief Complaint  Patient presents with  . Routine Post Op    3 wk f/u s/p R VATS, DR. 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  The patient had postop problems including acute on chronic renal failure, upper GI bleeding requiring laparotomy and oversewing of duodenal ulcer and several units of blood cell transfusion. He was discharged to a skilled nursing facility.  The patient has chronic HIV infection followed by infection disease clinic  The patient is being evaluated for radiation therapy by Dr. Lisbeth Renshaw. Apparently chemotherapy is not felt to be effective.  Today the patient had a PET scan and chest x-ray. PET scan showed the large right lower lobe mass to be significantly hyper  -metabolic  as well as the parietal pleura of the right  Hemi-thorax. There is a 2 cm liver metastatic focus noted as well. There was mediastinal involvement as well.chest x-ray today shows lymphangitic spread of cancer in the right lung, pleural effusion in the right lower lung field because of space from the entrapped right lower lobe  The patient denies any postthoracotomy pain.he is on home oxygen 2 L nasal cannula. He is taking a regular diet and receiving physical therapy at the nursing facility. The laparotomy incision is healing with secondary intent. The thoracotomy incision is healed. The patient wishes to return home however he lives alone and appears to be too weak to safely stay by himself    Past Medical History  Diagnosis Date  . HIV (human immunodeficiency virus infection) dx'd 2010  . High cholesterol   . Bleeding per rectum   . Depression   . Chronic renal disease   . Peripheral  neuropathy   . Pleural effusion on right   . Lung cancer 05/02/15    right lower lobe  lung bx=poorly diff malignancy  . Anxiety     Past Surgical History  Procedure Laterality Date  . Tonsillectomy    . Thoracentesis Right 04/25/2014  . Esophagogastroduodenoscopy N/A 05/01/2014    Procedure: ESOPHAGOGASTRODUODENOSCOPY (EGD);  Surgeon: [name], MD;  [facility] ENDOSCOPY;  Service: Endoscopy;  Laterality: N/A;  . Flexible sigmoidoscopy N/A 05/01/2014    Procedure: FLEXIBLE SIGMOIDOSCOPY;  Surgeon: [name], MD;  Location: [facility] ENDOSCOPY;  Service: Endoscopy;  Laterality: N/A;  . Video assisted thoracoscopy (vats)/decortication Right 05/05/2014    Procedure: VIDEO ASSISTED THORACOSCOPY (VATS)/DECORTICATION OF EMPYEMA;  Surgeon: Ivin Poot, MD;  Location: [place];  Service: Thoracic;  Laterality: Right;  . Laparotomy N/A 05/09/2014    Procedure: EXPLORATORY LAPAROTOMY;  Surgeon: Ralene Ok, MD;  Location: [place];  Service: General;  Laterality: N/A;  . Duodenotomy N/A 05/09/2014    Procedure: DUODENOTOMY with Primary ligation Bleeding Duodenal Ulcer;  Surgeon: Ralene Ok, MD;  Location: [place];  Service: General;  Laterality: N/A;    Family History  Problem Relation Age of Onset  . Heart failure Mother   . Pneumonia Father     Social History History  Substance Use Topics  . Smoking status: Former Smoker -- 1.00 packs/day for 46 years    Types: Cigarettes    Quit date: 04/24/2014  . Smokeless tobacco: Never Used  . Alcohol Use: 0.5 oz/week    1  Not specified per week     Comment: 04/28/2014 "might drink a little but only on holidays or vacations"    Current Outpatient Prescriptions  Medication Sig Dispense Refill  . Cholecalciferol (VITAMIN D3) 2000 UNITS capsule Take 2,000 Units by mouth daily.    . darunavir-cobicistat (PREZCOBIX) 800-150 MG per tablet Take 1 tablet by mouth daily. Swallow whole. Do NOT crush, break or chew tablets. Take with food.  30 tablet 0  . HYDROcodone-acetaminophen (NORCO) 5-325 MG per tablet Take 1 tablet by mouth every 6 (six) hours as needed for moderate pain. 30 tablet 0  . lamiVUDine (EPIVIR) 10 MG/ML solution Take 10 mLs (100 mg total) by mouth daily. 240 mL 0  . levalbuterol (XOPENEX) 1.25 MG/0.5ML nebulizer solution Take 1.25 mg by nebulization every 6 (six) hours as needed for wheezing or shortness of breath. 1 each 12  . Multiple Vitamin (MULTIVITAMIN WITH MINERALS) TABS tablet Take 1 tablet by mouth daily.    . Multiple Vitamins-Minerals (CENTRUM SILVER PO) Take 1 tablet by mouth daily.      . pantoprazole (PROTONIX) 40 MG tablet Take 1 tablet (40 mg total) by mouth 2 (two) times daily. 60 tablet 0  . rosuvastatin (CRESTOR) 5 MG tablet Take 1 tablet (5 mg total) by mouth daily. 30 tablet 11  . zidovudine (RETROVIR) 100 MG capsule Take 3 capsules (300 mg total) by mouth every 12 (twelve) hours. 60 capsule 0  . buPROPion (WELLBUTRIN XL) 150 MG 24 hr tablet Take 150 mg by mouth daily.    Marland Kitchen zolpidem (AMBIEN) 5 MG tablet Take 1 tablet (5 mg total) by mouth at bedtime as needed for sleep. (Patient not taking: Reported on 06/15/2014) 30 tablet 0   No current facility-administered medications for this visit.    No Known Allergies  Review of Systems  He denies fever He is very weak but walks with a walker No evidence of recurrent GI bleeding, melena, hematemesis   BP 115/86 mmHg  Pulse 116  Resp 18  Ht 5\' 6"  (1.676 m)  Wt 114 lb (51.71 kg)  BMI 18.41 kg/m2  SpO2 94% Physical Exam Alert chronically ill, appears very fragile Lungs diminished breath sounds right base Heart rate regular, 2-6 systolic murmur Right narcotic incision well-healed. Minimal erythema around the posterior right hemi-thorax chest tube site without drainage  Diagnostic Tests: Chest x-ray and PET scan were personally reviewed and results were discussed with the patient and his family  Impression: Advanced stage poorly  differentiated carcinoma which probably originated in the right lower lobe. No further surgical issues at this time. Right pleural effusion recurred because of space created by the entrapped right lower lobe which cannot be improved  Plan:he will plan on seeing Dr. Lisbeth Renshaw February 1 to discuss radiation therapy (palliative). I discussed the option of hospice care if the patient wished return home. He is not ready to make that decision at this time.  We'll see the patient back in 3 weeks with chest x-ray to followup the right pleural effusion. Prognosis is very poor. He understands the outlook.

## 2014-06-16 NOTE — Addendum Note (Signed)
Encounter addended by: Rebecca Eaton, RN on: 06/16/2014  2:47 PM<BR>     Documentation filed: Charges VN

## 2014-06-17 NOTE — Progress Notes (Signed)
Follow Up new Consult: Right lung  I:Dr. Lucianne Lei  Trigt,MD note 06/15/14:  Routine Post Op    3 wk f/u s/p R VATS, DR. 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    Impression:  Advanced stage poorly differentiated carcinoma which probably originated in the right lower lobe. No further surgical issues at this time. Right pleural effusion recurred because of space created by the entrapped right lower lobe which cannot be improved  Plan:he will plan on seeing Dr. Lisbeth Renshaw February 1 to discuss radiation therapy (palliative). I discussed the option of hospice care if the patient wished return home. He is not ready to make that decision at this time.  We'll see the patient back in 3 weeks with chest x-ray to followup the right pleural effusion. Prognosis is very poor. He understands the outlook.  Pet scan 06/15/14: No focal hypermetabolic activity to suggest skeletal metastasis.  IMPRESSION: 1. Extensive lymphangitic spread of disease throughout the right lung, with right hilar and subcarinal adenopathy, as well as a large malignant right pleural effusion. In addition, there is at least 1 lesion in the liver suspicious for hepatic metastasis in the medial aspect of segment 7, and potentially a smaller lesion at the junction of segments 2 and 4A. 2. No hypermetabolic activity in the region of the salivary glands to suggest a primary salivary gland malignancy. 3. Small focus of hypermetabolic activity in the antral pre-pyloric region of the stomach. This is nonspecific. Mucosal activity is commonly seen in areas portions of the GI tract, this may simply be physiologic, however, correlation with endoscopy should be considered if not recently performed. 4.  Cholelithiasis without evidence to suggest acute cholecystitis at this time. 5. Small pericardial effusion, increased compared to the prior examination.      Low b/p 99/53, 118, 24,89% 3 liters n/c, fatigued weak, drinks boost 2-3 daily, started Ceftin 06/08/14 for pneumonia, coughing up very little, very sob more since last Saturday

## 2014-06-20 ENCOUNTER — Encounter: Payer: Self-pay | Admitting: Radiation Oncology

## 2014-06-20 ENCOUNTER — Emergency Department (HOSPITAL_COMMUNITY): Payer: Commercial Managed Care - HMO

## 2014-06-20 ENCOUNTER — Ambulatory Visit: Payer: Commercial Managed Care - HMO | Admitting: Internal Medicine

## 2014-06-20 ENCOUNTER — Ambulatory Visit
Admission: RE | Admit: 2014-06-20 | Discharge: 2014-06-20 | Disposition: A | Discharge status: Home / Self Care | Payer: Commercial Managed Care - HMO | Source: Ambulatory Visit | Attending: Radiation Oncology | Admitting: Radiation Oncology

## 2014-06-20 ENCOUNTER — Inpatient Hospital Stay (HOSPITAL_COMMUNITY)
Admission: EM | Admit: 2014-06-20 | Discharge: 2014-07-19 | DRG: 180 | Disposition: E | Payer: Commercial Managed Care - HMO | Attending: Internal Medicine | Admitting: Internal Medicine

## 2014-06-20 ENCOUNTER — Other Ambulatory Visit: Payer: Self-pay

## 2014-06-20 ENCOUNTER — Encounter (HOSPITAL_COMMUNITY): Payer: Self-pay | Admitting: Emergency Medicine

## 2014-06-20 ENCOUNTER — Ambulatory Visit (HOSPITAL_BASED_OUTPATIENT_CLINIC_OR_DEPARTMENT_OTHER): Payer: Commercial Managed Care - HMO | Admitting: Nurse Practitioner

## 2014-06-20 ENCOUNTER — Encounter: Payer: Self-pay | Admitting: Nurse Practitioner

## 2014-06-20 VITALS — BP 99/53 | HR 124 | Temp 98.3°F | Resp 24 | Ht 66.0 in | Wt 110.6 lb

## 2014-06-20 VITALS — HR 120

## 2014-06-20 DIAGNOSIS — B20 Human immunodeficiency virus [HIV] disease: Secondary | ICD-10-CM | POA: Diagnosis present

## 2014-06-20 DIAGNOSIS — C3490 Malignant neoplasm of unspecified part of unspecified bronchus or lung: Secondary | ICD-10-CM | POA: Diagnosis present

## 2014-06-20 DIAGNOSIS — J9600 Acute respiratory failure, unspecified whether with hypoxia or hypercapnia: Secondary | ICD-10-CM

## 2014-06-20 DIAGNOSIS — Z85118 Personal history of other malignant neoplasm of bronchus and lung: Secondary | ICD-10-CM | POA: Diagnosis not present

## 2014-06-20 DIAGNOSIS — R0902 Hypoxemia: Secondary | ICD-10-CM | POA: Diagnosis not present

## 2014-06-20 DIAGNOSIS — F329 Major depressive disorder, single episode, unspecified: Secondary | ICD-10-CM | POA: Diagnosis not present

## 2014-06-20 DIAGNOSIS — C3401 Malignant neoplasm of right main bronchus: Secondary | ICD-10-CM

## 2014-06-20 DIAGNOSIS — C3431 Malignant neoplasm of lower lobe, right bronchus or lung: Principal | ICD-10-CM

## 2014-06-20 DIAGNOSIS — Z21 Asymptomatic human immunodeficiency virus [HIV] infection status: Secondary | ICD-10-CM | POA: Diagnosis not present

## 2014-06-20 DIAGNOSIS — N184 Chronic kidney disease, stage 4 (severe): Secondary | ICD-10-CM | POA: Diagnosis present

## 2014-06-20 DIAGNOSIS — N189 Chronic kidney disease, unspecified: Secondary | ICD-10-CM | POA: Diagnosis not present

## 2014-06-20 DIAGNOSIS — I959 Hypotension, unspecified: Secondary | ICD-10-CM | POA: Insufficient documentation

## 2014-06-20 DIAGNOSIS — Z9981 Dependence on supplemental oxygen: Secondary | ICD-10-CM

## 2014-06-20 DIAGNOSIS — J90 Pleural effusion, not elsewhere classified: Secondary | ICD-10-CM | POA: Diagnosis present

## 2014-06-20 DIAGNOSIS — J910 Malignant pleural effusion: Secondary | ICD-10-CM | POA: Diagnosis present

## 2014-06-20 DIAGNOSIS — E860 Dehydration: Secondary | ICD-10-CM | POA: Diagnosis present

## 2014-06-20 DIAGNOSIS — J9601 Acute respiratory failure with hypoxia: Secondary | ICD-10-CM | POA: Diagnosis present

## 2014-06-20 DIAGNOSIS — R531 Weakness: Secondary | ICD-10-CM

## 2014-06-20 DIAGNOSIS — E43 Unspecified severe protein-calorie malnutrition: Secondary | ICD-10-CM | POA: Diagnosis present

## 2014-06-20 DIAGNOSIS — Z87891 Personal history of nicotine dependence: Secondary | ICD-10-CM | POA: Diagnosis not present

## 2014-06-20 DIAGNOSIS — R0602 Shortness of breath: Secondary | ICD-10-CM | POA: Diagnosis not present

## 2014-06-20 DIAGNOSIS — F419 Anxiety disorder, unspecified: Secondary | ICD-10-CM | POA: Diagnosis not present

## 2014-06-20 DIAGNOSIS — Z8249 Family history of ischemic heart disease and other diseases of the circulatory system: Secondary | ICD-10-CM

## 2014-06-20 DIAGNOSIS — J189 Pneumonia, unspecified organism: Secondary | ICD-10-CM | POA: Diagnosis present

## 2014-06-20 DIAGNOSIS — E785 Hyperlipidemia, unspecified: Secondary | ICD-10-CM | POA: Diagnosis present

## 2014-06-20 DIAGNOSIS — K219 Gastro-esophageal reflux disease without esophagitis: Secondary | ICD-10-CM | POA: Diagnosis present

## 2014-06-20 DIAGNOSIS — E780 Pure hypercholesterolemia: Secondary | ICD-10-CM | POA: Diagnosis present

## 2014-06-20 DIAGNOSIS — Z66 Do not resuscitate: Secondary | ICD-10-CM | POA: Diagnosis present

## 2014-06-20 DIAGNOSIS — R5383 Other fatigue: Secondary | ICD-10-CM | POA: Diagnosis not present

## 2014-06-20 DIAGNOSIS — J948 Other specified pleural conditions: Secondary | ICD-10-CM

## 2014-06-20 DIAGNOSIS — Z79899 Other long term (current) drug therapy: Secondary | ICD-10-CM | POA: Diagnosis not present

## 2014-06-20 DIAGNOSIS — Z515 Encounter for palliative care: Secondary | ICD-10-CM

## 2014-06-20 DIAGNOSIS — S31609A Unspecified open wound of abdominal wall, unspecified quadrant with penetration into peritoneal cavity, initial encounter: Secondary | ICD-10-CM | POA: Diagnosis not present

## 2014-06-20 DIAGNOSIS — C779 Secondary and unspecified malignant neoplasm of lymph node, unspecified: Secondary | ICD-10-CM | POA: Diagnosis present

## 2014-06-20 DIAGNOSIS — C3491 Malignant neoplasm of unspecified part of right bronchus or lung: Secondary | ICD-10-CM

## 2014-06-20 DIAGNOSIS — Z9889 Other specified postprocedural states: Secondary | ICD-10-CM | POA: Diagnosis not present

## 2014-06-20 LAB — BASIC METABOLIC PANEL
ANION GAP: 11 (ref 5–15)
BUN: 58 mg/dL — ABNORMAL HIGH (ref 6–23)
CO2: 27 mmol/L (ref 19–32)
Calcium: 8.6 mg/dL (ref 8.4–10.5)
Chloride: 103 mmol/L (ref 96–112)
Creatinine, Ser: 2.38 mg/dL — ABNORMAL HIGH (ref 0.50–1.35)
GFR calc Af Amer: 31 mL/min — ABNORMAL LOW (ref >90)
GFR calc non Af Amer: 27 mL/min — ABNORMAL LOW (ref >90)
GLUCOSE: 124 mg/dL — AB (ref 70–99)
Potassium: 5 mmol/L (ref 3.5–5.1)
SODIUM: 141 mmol/L (ref 135–145)

## 2014-06-20 LAB — TROPONIN I: TROPONIN I: 0.19 ng/mL — AB (ref <0.031)

## 2014-06-20 LAB — CBC
HEMATOCRIT: 26.4 % — AB (ref 39.0–52.0)
Hemoglobin: 8.1 g/dL — ABNORMAL LOW (ref 13.0–17.0)
MCH: 31.5 pg (ref 26.0–34.0)
MCHC: 30.7 g/dL (ref 30.0–36.0)
MCV: 102.7 fL — AB (ref 78.0–100.0)
PLATELETS: 363 10*3/uL (ref 150–400)
RBC: 2.57 MIL/uL — AB (ref 4.22–5.81)
RDW: 24.1 % — ABNORMAL HIGH (ref 11.5–15.5)
WBC: 6 10*3/uL (ref 4.0–10.5)

## 2014-06-20 LAB — BRAIN NATRIURETIC PEPTIDE: B NATRIURETIC PEPTIDE 5: 119.3 pg/mL — AB (ref 0.0–100.0)

## 2014-06-20 MED ORDER — IPRATROPIUM-ALBUTEROL 0.5-2.5 (3) MG/3ML IN SOLN
3.0000 mL | Freq: Once | RESPIRATORY_TRACT | Status: AC
  Administered 2014-06-20: 3 mL via RESPIRATORY_TRACT
  Filled 2014-06-20: qty 3

## 2014-06-20 MED ORDER — LAMIVUDINE 10 MG/ML PO SOLN
100.0000 mg | Freq: Every day | ORAL | Status: DC
  Administered 2014-06-20: 100 mg via ORAL
  Filled 2014-06-20 (×2): qty 10

## 2014-06-20 MED ORDER — ATAZANAVIR-COBICISTAT 300-150 MG PO TABS
1.0000 | ORAL_TABLET | Freq: Every day | ORAL | Status: DC
  Administered 2014-06-20: 1 via ORAL
  Filled 2014-06-20 (×2): qty 1

## 2014-06-20 MED ORDER — ALBUTEROL SULFATE (2.5 MG/3ML) 0.083% IN NEBU
5.0000 mg | INHALATION_SOLUTION | Freq: Once | RESPIRATORY_TRACT | Status: AC
  Administered 2014-06-20: 5 mg via RESPIRATORY_TRACT
  Filled 2014-06-20: qty 6

## 2014-06-20 MED ORDER — PANTOPRAZOLE SODIUM 40 MG PO TBEC
40.0000 mg | DELAYED_RELEASE_TABLET | Freq: Two times a day (BID) | ORAL | Status: DC
  Administered 2014-06-20 – 2014-06-21 (×3): 40 mg via ORAL
  Filled 2014-06-20 (×5): qty 1

## 2014-06-20 MED ORDER — IPRATROPIUM-ALBUTEROL 0.5-2.5 (3) MG/3ML IN SOLN: 3.0000 mL | Freq: Once | RESPIRATORY_TRACT | Status: DC

## 2014-06-20 MED ORDER — ZOLPIDEM TARTRATE 5 MG PO TABS: 5.0000 mg | ORAL_TABLET | Freq: Every evening | ORAL | Status: DC | PRN

## 2014-06-20 MED ORDER — SODIUM CHLORIDE 0.9 % IV BOLUS (SEPSIS)
500.0000 mL | Freq: Once | INTRAVENOUS | Status: AC
  Administered 2014-06-20: 500 mL via INTRAVENOUS

## 2014-06-20 MED ORDER — ONDANSETRON HCL 4 MG PO TABS: 4.0000 mg | ORAL_TABLET | Freq: Four times a day (QID) | ORAL | Status: DC | PRN

## 2014-06-20 MED ORDER — ZIDOVUDINE 100 MG PO CAPS
300.0000 mg | ORAL_CAPSULE | Freq: Two times a day (BID) | ORAL | Status: DC
  Administered 2014-06-20: 300 mg via ORAL
  Filled 2014-06-20 (×3): qty 3

## 2014-06-20 MED ORDER — LEVALBUTEROL HCL 1.25 MG/0.5ML IN NEBU
1.2500 mg | INHALATION_SOLUTION | Freq: Four times a day (QID) | RESPIRATORY_TRACT | Status: DC | PRN
  Administered 2014-06-21: 1.25 mg via RESPIRATORY_TRACT
  Filled 2014-06-20 (×3): qty 0.5

## 2014-06-20 MED ORDER — ONDANSETRON HCL 4 MG/2ML IJ SOLN: 4.0000 mg | Freq: Four times a day (QID) | INTRAMUSCULAR | Status: DC | PRN

## 2014-06-20 MED ORDER — MIRTAZAPINE 30 MG PO TABS
15.0000 mg | ORAL_TABLET | Freq: Every day | ORAL | Status: DC
  Administered 2014-06-20 – 2014-06-21 (×2): 15 mg via ORAL
  Filled 2014-06-20 (×3): qty 0.5

## 2014-06-20 MED ORDER — MORPHINE SULFATE 4 MG/ML IJ SOLN
4.0000 mg | INTRAMUSCULAR | Status: DC | PRN
  Administered 2014-06-21: 4 mg via INTRAVENOUS
  Filled 2014-06-20: qty 1

## 2014-06-20 MED ORDER — SILVER SULFADIAZINE 1 % EX CREA
TOPICAL_CREAM | Freq: Every day | CUTANEOUS | Status: DC
  Administered 2014-06-21 – 2014-06-22 (×2): 1 via TOPICAL
  Filled 2014-06-20: qty 85

## 2014-06-20 NOTE — Assessment & Plan Note (Signed)
Patient is complaining of progressive dyspnea for the past few days as well.  Patient typically wears 02 the nasal cannula at 2 L.  However, patient has been experiencing increasing shortness of breath with decreasing O2 sats.  He has required 4 L nasal cannula O2 for the past few days; and O2 sats on exam today were 87.  Patient noted to have wheezing to all lung fields.  Patient did appear mildly short of breath on exam.  In no acute respiratory distress.  Patient states he was given 2 back-to-back albuterol nebulizer treatments prior to arriving to the [facility] today.

## 2014-06-20 NOTE — ED Notes (Signed)
Pt repositioned on left side with pillows underneath for comfort.

## 2014-06-20 NOTE — ED Provider Notes (Signed)
CSN: [id number]     Arrival date & time 07/17/2014  1656 History   First MD Initiated Contact with Patient 07/04/2014 1711     Chief Complaint  Patient presents with  . Respiratory Distress     (Consider location/radiation/quality/duration/timing/severity/associated sxs/prior Treatment) HPI  69 year old male with a history of stage IV lung cancer and HIV presents with dyspnea over last couple days. Is currently on antibiotics for a pneumonia diagnosed 2 weeks ago. Currently still on antibodies. Is chronically on oxygen but is having to have an increased oxygen requirement over last 2 days. Has also been feeling weak and his oncologist told him he is dehydrated. The patient has no further options such as radiation or chemotherapy given the extent of his disease. He and family are now talking to hospice. The patient had multiple breathing treatments couple hours ago but is still feeling short of breath.  Past Medical History  Diagnosis Date  . HIV (human immunodeficiency virus infection) dx'd 2010  . High cholesterol   . Bleeding per rectum   . Depression   . Chronic renal disease   . Peripheral neuropathy   . Pleural effusion on right   . Lung cancer 05/02/15    right lower lobe  lung bx=poorly diff malignancy  . Anxiety    Past Surgical History  Procedure Laterality Date  . Tonsillectomy    . Thoracentesis Right 04/25/2014  . Esophagogastroduodenoscopy N/A 05/01/2014    Procedure: ESOPHAGOGASTRODUODENOSCOPY (EGD);  Surgeon: [name], MD;  Location: [facility] ENDOSCOPY;  Service: Endoscopy;  Laterality: N/A;  . Flexible sigmoidoscopy N/A 05/01/2014    Procedure: FLEXIBLE SIGMOIDOSCOPY;  Surgeon: [name], MD;  Location: [facility] ENDOSCOPY;  Service: Endoscopy;  Laterality: N/A;  . Video assisted thoracoscopy (vats)/decortication Right 05/05/2014    Procedure: VIDEO ASSISTED THORACOSCOPY (VATS)/DECORTICATION OF EMPYEMA;  Surgeon: Ivin Poot, MD;  Location: [place];  Service:  Thoracic;  Laterality: Right;  . Laparotomy N/A 05/09/2014    Procedure: EXPLORATORY LAPAROTOMY;  Surgeon: Ralene Ok, MD;  Location: [place];  Service: General;  Laterality: N/A;  . Duodenotomy N/A 05/09/2014    Procedure: DUODENOTOMY with Primary ligation Bleeding Duodenal Ulcer;  Surgeon: Ralene Ok, MD;  Location: [place];  Service: General;  Laterality: N/A;   Family History  Problem Relation Age of Onset  . Heart failure Mother   . Pneumonia Father    History  Substance Use Topics  . Smoking status: Former Smoker -- 1.00 packs/day for 46 years    Types: Cigarettes    Quit date: 04/24/2014  . Smokeless tobacco: Never Used  . Alcohol Use: 0.5 oz/week    1 Not specified per week     Comment: 04/28/2014 "might drink a little but only on holidays or vacations"    Review of Systems  Constitutional: Negative for fever.  Respiratory: Positive for cough and shortness of breath.   Cardiovascular: Negative for chest pain and leg swelling.  Gastrointestinal: Negative for vomiting and abdominal pain.  Neurological: Positive for weakness.  All other systems reviewed and are negative.     Allergies  Review of patient's allergies indicates no known allergies.  Home Medications   Prior to Admission medications   Medication Sig Start Date End Date Taking? Authorizing Provider  buPROPion (WELLBUTRIN XL) 150 MG 24 hr tablet Take 150 mg by mouth daily.    Historical Provider, MD  cefUROXime (CEFTIN) 500 MG tablet Take 500 mg by mouth 2 (two) times daily with a meal. 06/08/14  Historical Provider, MD  Cholecalciferol (VITAMIN D3) 2000 UNITS capsule Take 2,000 Units by mouth daily. 10/16/10 03/09/20  Michel Bickers, MD  darunavir-cobicistat (PREZCOBIX) 800-150 MG per tablet Take 1 tablet by mouth daily. Swallow whole. Do NOT crush, break or chew tablets. Take with food. 05/23/14   Theodis Blaze, MD  HYDROcodone-acetaminophen (NORCO) 5-325 MG per tablet Take 1 tablet by mouth every 6  (six) hours as needed for moderate pain. Patient not taking: Reported on 06/30/2014 05/23/14   Theodis Blaze, MD  lamiVUDine (EPIVIR) 10 MG/ML solution Take 10 mLs (100 mg total) by mouth daily. 05/23/14   Theodis Blaze, MD  lamiVUDine (EPIVIR) 150 MG tablet  06/06/14   Historical Provider, MD  levalbuterol Penne Lash) 1.25 MG/0.5ML nebulizer solution Take 1.25 mg by nebulization every 6 (six) hours as needed for wheezing or shortness of breath. 05/23/14   Theodis Blaze, MD  Multiple Vitamin (MULTIVITAMIN WITH MINERALS) TABS tablet Take 1 tablet by mouth daily.    Historical Provider, MD  Multiple Vitamins-Minerals (CENTRUM SILVER PO) Take 1 tablet by mouth daily.      Historical Provider, MD  pantoprazole (PROTONIX) 40 MG tablet Take 1 tablet (40 mg total) by mouth 2 (two) times daily. 05/23/14   Theodis Blaze, MD  rosuvastatin (CRESTOR) 5 MG tablet Take 1 tablet (5 mg total) by mouth daily. 09/09/13   Michel Bickers, MD  sodium chloride (OCEAN) 0.65 % SOLN nasal spray Place 1 spray into both nostrils as needed for congestion.    Historical Provider, MD  zidovudine (RETROVIR) 100 MG capsule Take 3 capsules (300 mg total) by mouth every 12 (twelve) hours. 05/23/14   Theodis Blaze, MD  zidovudine (RETROVIR) 300 MG tablet  06/06/14   Historical Provider, MD  zolpidem (AMBIEN) 5 MG tablet Take 1 tablet (5 mg total) by mouth at bedtime as needed for sleep. Patient not taking: Reported on 06/15/2014 05/23/14   Theodis Blaze, MD   BP 119/72 mmHg  Pulse 118  Temp(Src) 97.8 F (36.6 C) (Oral)  Resp 28  SpO2 92% Physical Exam  Constitutional: He is oriented to person, place, and time. He appears well-developed and well-nourished. He appears ill.  HENT:  Head: Normocephalic and atraumatic.  Right Ear: External ear normal.  Left Ear: External ear normal.  Nose: Nose normal.  Eyes: Right eye exhibits no discharge. Left eye exhibits no discharge.  Neck: Neck supple.  Cardiovascular: Regular rhythm, normal heart sounds  and intact distal pulses.  Tachycardia present.   Pulmonary/Chest: Accessory muscle usage present. Tachypnea noted. He has wheezes. He has rales.  Abdominal: Soft. There is no tenderness.  Musculoskeletal: He exhibits no edema or tenderness.  Neurological: He is alert and oriented to person, place, and time.  Skin: Skin is warm and dry.  Nursing note and vitals reviewed.   ED Course  Procedures (including critical care time) Labs Review Labs Reviewed  BASIC METABOLIC PANEL - Abnormal; Notable for the following:    Glucose, Bld 124 (*)    BUN 58 (*)    Creatinine, Ser 2.38 (*)    GFR calc non Af Amer 27 (*)    GFR calc Af Amer 31 (*)    All other components within normal limits  CBC - Abnormal; Notable for the following:    RBC 2.57 (*)    Hemoglobin 8.1 (*)    HCT 26.4 (*)    MCV 102.7 (*)    RDW 24.1 (*)  All other components within normal limits  TROPONIN I - Abnormal; Notable for the following:    Troponin I 0.19 (*)    All other components within normal limits  BRAIN NATRIURETIC PEPTIDE - Abnormal; Notable for the following:    B Natriuretic Peptide 119.3 (*)    All other components within normal limits    Imaging Review Dg Chest 2 View (if Patient Has Fever And/or Copd)  07/07/2014   CLINICAL DATA:  Progressive weakness for several days.  EXAM: CHEST  2 VIEW  COMPARISON:  06/15/2014  FINDINGS: There is mild cardiac enlargement. Calcified atherosclerotic plaque involves the aortic arch. Large right pleural effusion is stable to increased in volume from previous exam. Asymmetric opacification of the right lung corresponding to known lymphangitic spread of tumor.  IMPRESSION: 1. Stable to increase in volume of right pleural effusion. 2. Opacification of the right lung corresponding to extensive lymphangitic spread of tumor throughout the right lung.   Electronically Signed   By: Kerby Moors M.D.   On: 07/17/2014 18:59     EKG Interpretation None      MDM   Final  diagnoses:  Hypoxia    Patient has increased O2 requirement, but no obvious new pneumonia. He is DNR, DNI, and does not want any escalation in care. He does not have O2 at home (does not want to go back to NH), and at this time given worsening O2, will admit for supportive care and arrangements to get home (home O2, hospice, nurse, etc)    Ephraim Hamburger, MD 06/21/14 [id number]

## 2014-06-20 NOTE — ED Notes (Signed)
Spoke with provider concerning patients condition with the [place] on versus the face mask. When provider was in the room earlier, pt requested to have face mask removed. Pt reports he will wear face mask until his food comes from his family. Placed face mask back on patient. Pt reports his breathing has improved.

## 2014-06-20 NOTE — Progress Notes (Signed)
Please see the Nurse Progress Note in the MD Initial Consult Encounter for this patient. 

## 2014-06-20 NOTE — Progress Notes (Signed)
CSW met with patient at bedside. Family was present. Niece states the patient comes from [facility] but does not want to do hospice treatment there. Patient states that he would like to be admitted into [facility] for Hospice treatment. Patient stated " I just want to be comfortable." According to patient, his niece is his primary support.  According to niece, she is the patient's only support. The patient is not married. Niece states that the patient's health has been declining for the past 2 1/2 months. Also, she states the patient is HIV positive, has cancer in his right lung, and has generalized weakness. Niece states that the patient has not gotten out of the bed for the last couple of days and that he is very weak.   CSW will sent out notification to Hospice facilities through [facility] [phone] H                                    [phone] W                                    [phone] [place], [place] ED CSW 07/13/2014 11:22 PM

## 2014-06-20 NOTE — H&P (Signed)
[facility] History and physical  [name] [name] WLN:[id number] DOB: Sep 29, 1945 DOA: 07/14/2014  Referring physician: Dr. Regenia Skeeter - Gabriel Cirri PCP: Cloyd Stagers, MD   Chief Complaint: SOB  HPI: [name] [name] is a 69 y.o. male  Pt presenting w/ progressive SOB over the past several days. Currently on Ceftin for possible pna. Dx 2 wks ago. Up until today pt was living in a long term care facility and had an in crease in his O2 requirement. Feeling more weak. Pt discussed hospice w/ oncology and would like to pursue that step. Pt is at home (as of today) w/o home oxygen. Breathing treatments w/o benefit. H/o failed, painful chest tubes for pleural effusions in the past but does not want to repeat those at this time. Denies fevers, CP, palpitations, Le swelling   Review of Systems:  Constitutional: weight loss, night sweats, chills, fatigue.  Denies fevers HEENT:  No headaches, Difficulty swallowing,Tooth/dental problems,Sore throat,  No sneezing, itching, ear ache, nasal congestion, post nasal drip,  Cardio-vascular:  No chest pain, Orthopnea, PND, swelling in lower extremities, anasarca, dizziness, palpitations  GI:  No heartburn, indigestion, abdominal pain, nausea, vomiting, diarrhea, change in bowel habits, loss of appetite  Resp:  Perh HPI Skin:  no rash or lesions.  GU:  no dysuria, change in color of urine, no urgency or frequency. No flank pain.  Musculoskeletal:   No joint pain or swelling. No decreased range of motion. No back pain.  Psych:  No change in mood or affect. No depression or anxiety. No memory loss.   Past Medical History  Diagnosis Date  . HIV (human immunodeficiency virus infection) dx'd 2010  . High cholesterol   . Bleeding per rectum   . Depression   . Chronic renal disease   . Peripheral neuropathy   . Pleural effusion on right   . Lung cancer 05/02/15    right lower lobe  lung bx=poorly diff malignancy  . Anxiety    Past Surgical  History  Procedure Laterality Date  . Tonsillectomy    . Thoracentesis Right 04/25/2014  . Esophagogastroduodenoscopy N/A 05/01/2014    Procedure: ESOPHAGOGASTRODUODENOSCOPY (EGD);  Surgeon: [name], MD;  Location: [facility] ENDOSCOPY;  Service: Endoscopy;  Laterality: N/A;  . Flexible sigmoidoscopy N/A 05/01/2014    Procedure: FLEXIBLE SIGMOIDOSCOPY;  Surgeon: [name], MD;  Location: [facility] ENDOSCOPY;  Service: Endoscopy;  Laterality: N/A;  . Video assisted thoracoscopy (vats)/decortication Right 05/05/2014    Procedure: VIDEO ASSISTED THORACOSCOPY (VATS)/DECORTICATION OF EMPYEMA;  Surgeon: Ivin Poot, MD;  Location: [place];  Service: Thoracic;  Laterality: Right;  . Laparotomy N/A 05/09/2014    Procedure: EXPLORATORY LAPAROTOMY;  Surgeon: Ralene Ok, MD;  Location: [place];  Service: General;  Laterality: N/A;  . Duodenotomy N/A 05/09/2014    Procedure: DUODENOTOMY with Primary ligation Bleeding Duodenal Ulcer;  Surgeon: Ralene Ok, MD;  Location: [place];  Service: General;  Laterality: N/A;   Social History:  reports that he quit smoking about 8 weeks ago. His smoking use included Cigarettes. He has a 46 pack-year smoking history. He has never used smokeless tobacco. He reports that he drinks about 0.5 oz of alcohol per week. He reports that he uses illicit drugs.  No Known Allergies  Family History  Problem Relation Age of Onset  . Heart failure Mother   . Pneumonia Father      Prior to Admission medications   Medication Sig Start Date End Date Taking? Authorizing Provider  atazanavir-cobicistat (EVOTAZ) 300-150  MG per tablet Take 1 tablet by mouth daily. Swallow whole. Do NOT crush, cut or chew tablet. Take with food.   Yes Historical Provider, MD  atorvastatin (LIPITOR) 20 MG tablet Take 20 mg by mouth daily.   Yes Historical Provider, MD  cefUROXime (CEFTIN) 500 MG tablet Take 500 mg by mouth 2 (two) times daily with a meal. 06/08/14  Yes Historical Provider, MD    Cholecalciferol (VITAMIN D3) 2000 UNITS capsule Take 2,000 Units by mouth daily. 10/16/10 03/09/20 Yes Michel Bickers, MD  HYDROcodone-acetaminophen (NORCO) 5-325 MG per tablet Take 1 tablet by mouth every 6 (six) hours as needed for moderate pain. 05/23/14  Yes Theodis Blaze, MD  lamiVUDine (EPIVIR) 10 MG/ML solution Take 10 mLs (100 mg total) by mouth daily. 05/23/14  Yes Theodis Blaze, MD  levalbuterol (XOPENEX) 1.25 MG/0.5ML nebulizer solution Take 1.25 mg by nebulization every 6 (six) hours as needed for wheezing or shortness of breath. 05/23/14  Yes Theodis Blaze, MD  mirtazapine (REMERON) 15 MG tablet Take 15 mg by mouth at bedtime.   Yes Historical Provider, MD  Multiple Vitamins-Minerals (CENTRUM SILVER PO) Take 1 tablet by mouth daily.     Yes Historical Provider, MD  Nutritional Supplements (BOOST PLUS PO) Take 1 Can by mouth 3 (three) times daily with meals.   Yes Historical Provider, MD  pantoprazole (PROTONIX) 40 MG tablet Take 1 tablet (40 mg total) by mouth 2 (two) times daily. 05/23/14  Yes Theodis Blaze, MD  promethazine (PHENERGAN) 25 MG tablet Take 25 mg by mouth every 6 (six) hours as needed for nausea or vomiting.   Yes Historical Provider, MD  zidovudine (RETROVIR) 100 MG capsule Take 3 capsules (300 mg total) by mouth every 12 (twelve) hours. 05/23/14  Yes Theodis Blaze, MD  zidovudine (RETROVIR) 100 MG capsule Take 300 mg by mouth every 12 (twelve) hours.   Yes Historical Provider, MD  zolpidem (AMBIEN) 5 MG tablet Take 1 tablet (5 mg total) by mouth at bedtime as needed for sleep. 05/23/14  Yes Theodis Blaze, MD  darunavir-cobicistat (PREZCOBIX) 800-150 MG per tablet Take 1 tablet by mouth daily. Swallow whole. Do NOT crush, break or chew tablets. Take with food. Patient not taking: Reported on 06/29/2014 05/23/14   Theodis Blaze, MD  rosuvastatin (CRESTOR) 5 MG tablet Take 1 tablet (5 mg total) by mouth daily. Patient not taking: Reported on 06/30/2014 09/09/13   Michel Bickers, MD   Physical  Exam: Filed Vitals:   06/29/2014 1953 07/08/2014 2015 06/26/2014 2045 07/17/2014 2202  BP: 118/86 116/56 118/63 105/55  Pulse: 120 113 118 118  Temp: 97.7 F (36.5 C)     TempSrc: Oral     Resp: 28   24  SpO2: 86% 91% 89% 91%    Wt Readings from Last 3 Encounters:  06/15/14 51.71 kg (114 lb)  05/22/14 48 kg (105 lb 13.1 oz)  04/21/14 55.52 kg (122 lb 6.4 oz)    General:  Appears mildly distressed  Eyes:  PERRL, normal lids, irises & conjunctiva ENT:  grossly normal hearing, lips & tongue Neck:  no LAD, masses or thyromegaly Cardiovascular:  RRR, no m/r/g. No LE edema. Telemetry:  SR, no arrhythmias  Respiratory: Abscence of lung sounds in RLL w/ ronchi and crackles throughout both lung fields R>L,. Increased effort.  Abdomen:  soft, ntnd, abdominal incision running vertically w/ pink granulation tissue in the center. Dressing c/d/i Skin:  no rash or induration seen on  limited exam Musculoskeletal:  grossly normal tone BUE/BLE Psychiatric:  grossly normal mood and affect, speech fluent and appropriate Neurologic:  grossly non-focal.          Labs on Admission:  Basic Metabolic Panel:  Recent Labs Lab 07/10/2014 1710  NA 141  K 5.0  CL 103  CO2 27  GLUCOSE 124*  BUN 58*  CREATININE 2.38*  CALCIUM 8.6   Liver Function Tests: No results for input(s): AST, ALT, ALKPHOS, BILITOT, PROT, ALBUMIN in the last 168 hours. No results for input(s): LIPASE, AMYLASE in the last 168 hours. No results for input(s): AMMONIA in the last 168 hours. CBC:  Recent Labs Lab 06/29/2014 1710  WBC 6.0  HGB 8.1*  HCT 26.4*  MCV 102.7*  PLT 363   Cardiac Enzymes:  Recent Labs Lab 07/17/2014 1710  TROPONINI 0.19*    BNP (last 3 results)  Recent Labs  06/26/2014 1722  BNP 119.3*    ProBNP (last 3 results)  Recent Labs  04/21/14 1131  PROBNP 25.0    CBG:  Recent Labs Lab 06/15/14 0702  GLUCAP 119*    Radiological Exams on Admission: Dg Chest 2 View (if Patient Has Fever  And/or Copd)  07/13/2014   CLINICAL DATA:  Progressive weakness for several days.  EXAM: CHEST  2 VIEW  COMPARISON:  06/15/2014  FINDINGS: There is mild cardiac enlargement. Calcified atherosclerotic plaque involves the aortic arch. Large right pleural effusion is stable to increased in volume from previous exam. Asymmetric opacification of the right lung corresponding to known lymphangitic spread of tumor.  IMPRESSION: 1. Stable to increase in volume of right pleural effusion. 2. Opacification of the right lung corresponding to extensive lymphangitic spread of tumor throughout the right lung.   Electronically Signed   By: Kerby Moors M.D.   On: 07/07/2014 18:59    EKG: Independently reviewed. 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 Pt w/ poor prognosis and pt not opting for further treatment. Pt wishing to enter hospice.  - consult Oncology if pt changes his mind.   Social: Lengthy discussion had w/ pt and family members regarding further care. Pt states he wishes he lived in [place] where its legal to commit medical suicide. Pt adamently refuses any corrective therapies and states he wants to be made comfortable as he passes. Pt w/ new O2 requirement and decompensating. Apparently the hospice process was started but pt no longer at care facility and wishes hospice at home or [facility]. Social work and case mgt consulted while in ED and met w/ pt to start the process.  - f/u Hospice plcmt - Pt needs home hospice or placement prior to discharge - f/u Case Mgt and CSW recommendations  Acute  respiratory failure: New O2 requirement. Likely secondary from malignant pleural effusion. Failed placement of pleural catheter by cardiothoracic. Pt adamantly refusing invasive therapies - Lasix 20 PO BID - no further thoracentesis per pt - O2 PRN - Morphine for air hunger.   Abdominal wound: well healing s/p vascular surgery.  - silvadene cream and dressing changes.   CKD 4: Cr 2.38.  Baseline 2.5.  - monitor - pt not a dialysis candidate.   HIV:  - continue Retrovir and Evotaz, epivir  GERD:  - continue PPI  HLD: - hold Crestor    Code Status: DNR DVT Prophylaxis: None Family Communication: Niece Disposition Plan: pending Hospice placement  Yolanda Dockendorf, Dagan J, MD Family Medicine [facility] [email or web address] Password [email or web address]

## 2014-06-20 NOTE — ED Notes (Signed)
Per Juliann Pulse from [facility] pt complaint of respiratory distress with 86% on 2 lpm [place]. Pt placed on 4 lpm [place] per [place]. On arrival pt saturation on 4 lpm [place] 74%. Pt placed on NRB now saturation of 98%.

## 2014-06-20 NOTE — Assessment & Plan Note (Signed)
Blood pressure on exam today was 99/60.  Most likely, patient is dehydrated; but impending/recurrent sepsis should be considered as a differential as well.  Patient is afebrile while at the cancer center.

## 2014-06-20 NOTE — ED Notes (Signed)
Patients family member has brought him a sandwich. Pt has removed the mask to eat but placed patient on 6L via [place] while he is eating.

## 2014-06-20 NOTE — ED Notes (Signed)
Pt placed on 4 lpm [place].

## 2014-06-20 NOTE — Assessment & Plan Note (Signed)
Patient recently diagnosed with lung cancer.  Patient did undergo a prolonged hospital admission from 04/28/2014 through 05/23/2014 for recurrent right pleural effusion.  Patient was discharged from the hospital to [facility] rehabilitation nursing facility.  Patient returned to the [facility] today for initial consult with Dr. Lisbeth Renshaw radiation oncologist.  Both patient and family report that Dr. Lisbeth Renshaw has advised no radiation therapy.  Patient has undergone no medical oncology visits other than the initial hospital consultation per Dr. Alvy Bimler.  After a brief discussion with patient here at the [facility] this afternoon-patient and his family both expressed the wish to further explore the hospice option versus aggressive chemotherapy treatment.  Patient states he has already completed the paperwork in regards to DO NOT RESUSCITATE status.

## 2014-06-20 NOTE — ED Notes (Signed)
Attempted to call report and primary nurse unable to take report. Primary nurse for rm [phone] will call back to the ED.

## 2014-06-20 NOTE — ED Notes (Addendum)
Spoke with admitting provider, pt can have either the nasal cannula or face mask on depending on patients preference. The main focus is patients comfort. Will explain to patient.

## 2014-06-20 NOTE — Progress Notes (Signed)
SYMPTOM MANAGEMENT CLINIC   HPI: [name] [name] 69 y.o. male recently diagnosed with lung cancer.  Underwent initial radiation oncology consultation today.  Receiving no active treatment at present.  Patient underwent his first radiation oncology consultation earlier today with Dr. Lisbeth Renshaw.  Both patient and the family report that Dr. Lisbeth Renshaw advises no radiation therapy.  It was noted while patient was in radiation oncology today that patient was extremely weak, dehydrated, and hypotensive.  Patient is typically on O2 at 2 L nasal cannula; but has been requiring 4 L nasal cannula to keep O2 sats in the upper 80s for the past few days.  Patient is complaining of increased dyspnea; but minimal productive cough.  Patient denies any recent fevers or chills.  Patient continues to take Ceftin as previously directed.  Patient was admitted from 04/28/2014 through 05/23/2014 for pneumonia and sepsis.  Patient also has a history of HIV.   Patient denies any nausea, vomiting, diarrhea, or constipation.  Patient was discharged from the hospital; and transferred directly to [facility] rehabilitation nursing facility.  Patient states that he does not want to return to [facility] rehabilitation nursing facility; but would prefer a hospice referral and possibly placement at [facility] place.  Patient confirmed that he has already made the decision to become a DO NOT RESUSCITATE.  HPI  CURRENT THERAPY: No active treatment plan for patient.   ROS  Past Medical History  Diagnosis Date  . HIV (human immunodeficiency virus infection) dx'd 2010  . High cholesterol   . Bleeding per rectum   . Depression   . Chronic renal disease   . Peripheral neuropathy   . Pleural effusion on right   . Lung cancer 05/02/15    right lower lobe  lung bx=poorly diff malignancy  . Anxiety     Past Surgical History  Procedure Laterality Date  . Tonsillectomy    . Thoracentesis Right 04/25/2014  . Esophagogastroduodenoscopy  N/A 05/01/2014    Procedure: ESOPHAGOGASTRODUODENOSCOPY (EGD);  Surgeon: [name], MD;  Location: [facility] ENDOSCOPY;  Service: Endoscopy;  Laterality: N/A;  . Flexible sigmoidoscopy N/A 05/01/2014    Procedure: FLEXIBLE SIGMOIDOSCOPY;  Surgeon: [name], MD;  Location: [facility] ENDOSCOPY;  Service: Endoscopy;  Laterality: N/A;  . Video assisted thoracoscopy (vats)/decortication Right 05/05/2014    Procedure: VIDEO ASSISTED THORACOSCOPY (VATS)/DECORTICATION OF EMPYEMA;  Surgeon: Ivin Poot, MD;  Location: [place];  Service: Thoracic;  Laterality: Right;  . Laparotomy N/A 05/09/2014    Procedure: EXPLORATORY LAPAROTOMY;  Surgeon: Ralene Ok, MD;  Location: [place];  Service: General;  Laterality: N/A;  . Duodenotomy N/A 05/09/2014    Procedure: DUODENOTOMY with Primary ligation Bleeding Duodenal Ulcer;  Surgeon: Ralene Ok, MD;  Location: [place];  Service: General;  Laterality: N/A;    has Human immunodeficiency virus (HIV) disease; Cryptococcosis; CIGARETTE SMOKER; DENTAL CARIES; ARTHRALGIA; LOW BACK PAIN, ACUTE; SHINGLES, HX OF; OTHER&UNSPECIFIED DISEASES THE ORAL SOFT TISSUES; PEYRONIES DISEASE; HPV (human papilloma virus) infection; Renal insufficiency; Dyslipidemia; Dry mouth; Pleural effusion on right; Renal failure; Acute kidney injury; Hyperkalemia; Recurrent pleural effusion on right; Macrocytic anemia; Gallbladder stone without cholecystitis or obstruction; AKI (acute kidney injury); Protein-calorie malnutrition, severe; SOB (shortness of breath); Shock; Acute respiratory failure with hypoxia; Upper GI bleed; Abdominal pain; Sepsis; Lung cancer; HCAP (healthcare-associated pneumonia); Lung metastasis; Dehydration; Weakness; and Hypotension on his problem list.    has No Known Allergies.    Medication List       This list is accurate  as of: 06/25/2014  5:08 PM.  Always use your most recent med list.               atorvastatin 20 MG tablet  Commonly known as:  LIPITOR    Take 20 mg by mouth daily.     buPROPion 150 MG 24 hr tablet  Commonly known as:  WELLBUTRIN XL  Take 150 mg by mouth daily.     cefUROXime 500 MG tablet  Commonly known as:  CEFTIN  Take 500 mg by mouth 2 (two) times daily with a meal.     CENTRUM SILVER PO  Take 1 tablet by mouth daily.     darunavir-cobicistat 800-150 MG per tablet  Commonly known as:  PREZCOBIX  Take 1 tablet by mouth daily. Swallow whole. Do NOT crush, break or chew tablets. Take with food.     HYDROcodone-acetaminophen 5-325 MG per tablet  Commonly known as:  NORCO  Take 1 tablet by mouth every 6 (six) hours as needed for moderate pain.     lamiVUDine 10 MG/ML solution  Commonly known as:  EPIVIR  Take 10 mLs (100 mg total) by mouth daily.     lamiVUDine 150 MG tablet  Commonly known as:  EPIVIR     levalbuterol 1.25 MG/0.5ML nebulizer solution  Commonly known as:  XOPENEX  Take 1.25 mg by nebulization every 6 (six) hours as needed for wheezing or shortness of breath.     mirtazapine 15 MG tablet  Commonly known as:  REMERON  Take 15 mg by mouth at bedtime.     multivitamin with minerals Tabs tablet  Take 1 tablet by mouth daily.     pantoprazole 40 MG tablet  Commonly known as:  PROTONIX  Take 1 tablet (40 mg total) by mouth 2 (two) times daily.     rosuvastatin 5 MG tablet  Commonly known as:  CRESTOR  Take 1 tablet (5 mg total) by mouth daily.     sodium chloride 0.65 % Soln nasal spray  Commonly known as:  OCEAN  Place 1 spray into both nostrils as needed for congestion.     Vitamin D3 2000 UNITS capsule  Take 2,000 Units by mouth daily.     zidovudine 100 MG capsule  Commonly known as:  RETROVIR  Take 3 capsules (300 mg total) by mouth every 12 (twelve) hours.     zidovudine 300 MG tablet  Commonly known as:  RETROVIR     zolpidem 5 MG tablet  Commonly known as:  AMBIEN  Take 1 tablet (5 mg total) by mouth at bedtime as needed for sleep.         PHYSICAL  EXAMINATION  Pulse 120, SpO2 87 %. BP 99/60, temp 98.3  Physical Exam  Constitutional: He is oriented to person, place, and time. He appears malnourished and dehydrated. He appears unhealthy. He appears cachectic. He has a sickly appearance.  HENT:  Head: Normocephalic and atraumatic.  Mouth/Throat: Oropharynx is clear and moist.  Lips dry and cracked.  Eyes: Conjunctivae and EOM are normal. Pupils are equal, round, and reactive to light. Right eye exhibits no discharge. Left eye exhibits no discharge. No scleral icterus.  Neck: Normal range of motion. Neck supple. No JVD present. No tracheal deviation present. No thyromegaly present.  Cardiovascular: Regular rhythm, normal heart sounds and intact distal pulses.   Tachycardia  Pulmonary/Chest: No respiratory distress. He has wheezes. He has no rales. He exhibits no tenderness.  Wheezes to all lung  fields.  Mild dyspnea on exam.  Abdominal: Soft. Bowel sounds are normal. He exhibits no distension and no mass. There is no tenderness. There is no rebound and no guarding.  Musculoskeletal: Normal range of motion. He exhibits no edema or tenderness.  Lymphadenopathy:    He has no cervical adenopathy.  Neurological: He is alert and oriented to person, place, and time.  Skin: Skin is warm and dry. No rash noted. No erythema. There is pallor.  Psychiatric: Affect normal.  Nursing note and vitals reviewed.   LABORATORY DATA:. Admission on 07/03/2014  Component Date Value Ref Range Status  . Sodium 07/14/2014 141  135 - 145 mmol/L Final  . Potassium 07/02/2014 5.0  3.5 - 5.1 mmol/L Final  . Chloride 06/22/2014 103  96 - 112 mmol/L Final  . CO2 07/17/2014 27  19 - 32 mmol/L Final  . Glucose, Bld 07/02/2014 124* 70 - 99 mg/dL Final  . BUN 07/03/2014 58* 6 - 23 mg/dL Final  . Creatinine, Ser 06/26/2014 2.38* 0.50 - 1.35 mg/dL Final  . Calcium 07/15/2014 8.6  8.4 - 10.5 mg/dL Final  . GFR calc non Af Amer 06/29/2014 27* >90 mL/min Final  .  GFR calc Af Amer 07/07/2014 31* >90 mL/min Final   Comment: (NOTE) The eGFR has been calculated using the CKD EPI equation. This calculation has not been validated in all clinical situations. eGFR's persistently <90 mL/min signify possible Chronic Kidney Disease.   . Anion gap 07/08/2014 11  5 - 15 Final  . WBC 06/26/2014 6.0  4.0 - 10.5 K/uL Final  . RBC 07/07/2014 2.57* 4.22 - 5.81 MIL/uL Final  . Hemoglobin 07/14/2014 8.1* 13.0 - 17.0 g/dL Final  . HCT 07/09/2014 26.4* 39.0 - 52.0 % Final  . MCV 06/30/2014 102.7* 78.0 - 100.0 fL Final  . MCH 06/28/2014 31.5  26.0 - 34.0 pg Final  . MCHC 07/03/2014 30.7  30.0 - 36.0 g/dL Final  . RDW 07/08/2014 24.1* 11.5 - 15.5 % Final  . Platelets 06/26/2014 363  150 - 400 K/uL Final  . Troponin I 07/09/2014 0.19* <0.031 ng/mL Final   Comment:        PERSISTENTLY INCREASED TROPONIN VALUES IN THE RANGE OF 0.04-0.49 ng/mL CAN BE SEEN IN:       -UNSTABLE ANGINA       -CONGESTIVE HEART FAILURE       -MYOCARDITIS       -CHEST TRAUMA       -ARRYHTHMIAS       -LATE PRESENTING MYOCARDIAL INFARCTION       -COPD   CLINICAL FOLLOW-UP RECOMMENDED.   . B Natriuretic Peptide 07/17/2014 119.3* 0.0 - 100.0 pg/mL Final     RADIOGRAPHIC STUDIES: No results found.  ASSESSMENT/PLAN:    Weakness Patient is complaining of progressive weakness for the past several days. Patient states he is now too weak to ambulate to the bathroom.  Most likely, weakness is secondary to general deconditioning, dehydration, and perhaps recurring pneumonia.   SOB (shortness of breath) Patient is complaining of progressive dyspnea for the past few days as well.  Patient typically wears 02 the nasal cannula at 2 L.  However, patient has been experiencing increasing shortness of breath with decreasing O2 sats.  He has required 4 L nasal cannula O2 for the past few days; and O2 sats on exam today were 87.  Patient noted to have wheezing to all lung fields.  Patient did appear  mildly short of breath on exam.  In no acute respiratory distress.  Patient states he was given 2 back-to-back albuterol nebulizer treatments prior to arriving to the [place] today.   Lung cancer Patient recently diagnosed with lung cancer.  Patient did undergo a prolonged hospital admission from 04/28/2014 through 05/23/2014 for recurrent right pleural effusion.  Patient was discharged from the hospital to [facility] rehabilitation nursing facility.  Patient returned to the [facility] today for initial consult with Dr. Lisbeth Renshaw radiation oncologist.  Both patient and family report that Dr. Lisbeth Renshaw has advised no radiation therapy.  Patient has undergone no medical oncology visits other than the initial hospital consultation per Dr. Alvy Bimler.  After a brief discussion with patient here at the [facility] this afternoon-patient and his family both expressed the wish to further explore the hospice option versus aggressive chemotherapy treatment.  Patient states he has already completed the paperwork in regards to DO NOT RESUSCITATE status.     Hypotension Blood pressure on exam today was 99/60.  Most likely, patient is dehydrated; but impending/recurrent sepsis should be considered as a differential as well.  Patient is afebrile while at the cancer center.   Dehydration Patient reports minimal appetite and poor oral intake.  Patient does appear fairly dehydrated on exam today.   Due to patient's weakness, hypertension, dehydration, and progressive dyspnea-patient will be transported to the emergency department for further evaluation and management.  Brief history and report was called to the emergency department charge nurse Stacy prior to patient being transported to the emergency department per [place] via wheelchair.  Patient stated understanding of all instructions; and was in agreement with this plan of care. The patient knows to call the clinic with any problems, questions  or concerns.   Review/collaboration with Dr. Alvy Bimler regarding all aspects of patient's visit today.   Total time spent with patient was 40 minutes;  with greater than 75 percent of that time spent in face to face counseling regarding patient's symptoms,  and coordination of care and follow up.  Disclaimer: This note was dictated with voice recognition software. Similar sounding words can inadvertently be transcribed and may not be corrected upon review.   Drue Second, NP 07/17/2014

## 2014-06-20 NOTE — Progress Notes (Signed)
  CARE MANAGEMENT ED NOTE 07/17/2014  Patient:  [name] [name],[name] [name]   Account Number:  [id number]  Date Initiated:  07/07/2014  Documentation initiated by:  Livia Snellen  Subjective/Objective Assessment:   Patient presents to ED with increased shortness of breath     Subjective/Objective Assessment Detail:   Patient with pmhx of HIV, lung cancer.  Wears 2 liters via nasal cannula in which he has increased to 4 liters and facility.  Patient noted to be on 14 liters via mask in ED, tachycardic.     Action/Plan:   Action/Plan Detail:   Anticipated DC Date:       Status Recommendation to Physician:   Result of Recommendation:    Other ED Services  Consult Working Plan   In-house referral  Clinical Social Worker   DC Forensic scientist  Other   [facility] Choice  HOME HEALTH   Choice offered to / List presented to:  C-4 Adult Children         HH agency  HOSPICE AND PALLIATIVE CARE OF     Status of service:  Completed, signed off  ED Comments:   ED Comments Detail:  EDCM spoke to patient and his family at bedside.  Patient's niece Vanetta Shawl to be called for emergency [phone].  Patient is from [facility].  Patient refuses to go back to [facility].  He wishes to go home or to [facility].  Patient lives alone, does not have oxygen at home.  Patient's niece lives in [place].  EDCM presented list of home health hospice agencies of which [facility] was chosen.  EDCM to send hospice referral this evening to [facility].  Patient is a DNR.  Patient reports he wants to be "Kept comfortable without pain."  At this time, patient does not wish to treat his cancer. Discussed with EDP and consulting hospitalist.  No further [facility] needs at this time.

## 2014-06-20 NOTE — Assessment & Plan Note (Signed)
Patient reports minimal appetite and poor oral intake.  Patient does appear fairly dehydrated on exam today.

## 2014-06-20 NOTE — Assessment & Plan Note (Signed)
Patient is complaining of progressive weakness for the past several days. Patient states he is now too weak to ambulate to the bathroom.  Most likely, weakness is secondary to general deconditioning, dehydration, and perhaps recurring pneumonia.

## 2014-06-20 NOTE — ED Notes (Signed)
MD at bedside. Hospitalist

## 2014-06-20 NOTE — Progress Notes (Signed)
Called [name], No, symptom management to see if she can see patient, increasing sob,  Per Dr.moody and increase heart rate, yes,, will see patient to send to lobby  3:53 PM

## 2014-06-21 ENCOUNTER — Encounter: Payer: Self-pay | Admitting: Radiation Oncology

## 2014-06-21 MED ORDER — FUROSEMIDE 10 MG/ML IJ SOLN
20.0000 mg | Freq: Once | INTRAMUSCULAR | Status: AC
  Administered 2014-06-21: 20 mg via INTRAVENOUS
  Filled 2014-06-21: qty 2

## 2014-06-21 MED ORDER — FUROSEMIDE 10 MG/ML IJ SOLN: 40.0000 mg | Freq: Once | INTRAMUSCULAR | Status: DC

## 2014-06-21 MED ORDER — MORPHINE SULFATE (CONCENTRATE) 10 MG/0.5ML PO SOLN
5.0000 mg | ORAL | Status: DC | PRN
  Administered 2014-06-21 – 2014-06-22 (×4): 5 mg via SUBLINGUAL
  Filled 2014-06-21 (×4): qty 0.5

## 2014-06-21 MED ORDER — ATROPINE SULFATE 1 % OP SOLN
2.0000 [drp] | Freq: Four times a day (QID) | OPHTHALMIC | Status: DC
  Administered 2014-06-21 – 2014-06-22 (×7): 2 [drp] via SUBLINGUAL
  Filled 2014-06-21: qty 2

## 2014-06-21 NOTE — Progress Notes (Signed)
PMT noted plans for [facility]. Will sign off. If [name] [name] status changes or he has any urgent symptom management needs prior to transfer please re-consult [place] or call our team phone [phone]. We are happy to help if needed.  Lane Hacker, DO Palliative Medicine

## 2014-06-21 NOTE — Consult Note (Signed)
[place] Liaison: Received request from [place] for family interest in [facility]. Chart reviewed. Per [employer] clinical review, patient is eligible. CSW aware. Will update CSW re availability in am. Thank you. Erling Conte, [place]

## 2014-06-21 NOTE — Progress Notes (Signed)
[facility] PROGRESS NOTE  [name] [name] TDD:[id number] DOB: 06/24/1945 DOA: 07/03/2014 PCP: Cloyd Stagers, MD  Assessment/Plan: 1. Acute respiratory failure- secondary to extensive lymphangitic spread of the tumor, right pleural effusion. Patient on  oxygen via Ventimask, as worsening of dyspnea, started on morphine when necessary for symptom management for dyspnea. 2. HIV- patient has been on antiretroviral therapy at home, at this time will discontinue the therapy as patient has opted for hospice. 3. Lung cancer- patient has stage IV carcinoma right lower lobe with extensive circumferential right pleural involvement, not a candidate for chemotherapy or radiation treatment. Patient has opted for comfort measures only. No aggressive treatments sought by the patient or family. 4. Goals of care- 69 year old male with a history of HIV and diagnosis of right lung cancer T4 N1 M0 with extensive pleural involvement status post surgery and pleurodesis, now presenting with acute respiratory failure with worsening pleural effusion and extensive tumor spread to the right lung. Patient has very poor prognosis, requiring high flow oxygen 10 L to maintain O2 sats more than 90%, I feel that patient has only a few hours to days to live based on the worsening of the respiratory status. Patient will benefit from going to residential hospice for active symptom management and end-of-life care.  Code Status: *DNR Family Communication: *Discussed with patient's niece and her husband at bedside Disposition Plan: Residential hospice   Consultants:  *None  Procedures:  None  Antibiotics:  None  HPI/Subjective: *68 year old male with a history of HIV, diagnosed with right lung cancer T4 N1 M0 with pleural effusion status post surgery and pleurodesis. Oncology deemed patient not a candidate for systemic chemotherapy at this time. The biopsy returned positive for poorly differentiated carcinoma.  Patient underwent PET scan which showed extensive tumor throughout right lung/pleura with likely metastasis to liver. Patient was not found to be a candidate for palliative radiation therapy. Patient now presented with progressive shortness of breath over the past several days, he was started on Ceftin for possible pneumonia which was diagnosed 2 weeks ago. Chest x-ray showed large right pleural effusion, opacification of the right lung corresponding to extensive lymphangitic spread of tumor throughout the right lung.  Patient opted for no further aggressive management including thoracentesis, he has opted for comfort care approach.   Objective: Filed Vitals:   06/21/14 0446  BP: 102/60  Pulse: 121  Temp: 98.4 F (36.9 C)  Resp: 24   No intake or output data in the 24 hours ending 06/21/14 0940 Filed Weights   06/27/2014 2224  Weight: 52.5 kg (115 lb 11.9 oz)    Exam:  Physical Exam: Eyes: No icterus, extraocular muscles intact  Mouth: Oral mucosa is moist, no lesions on palate,  Neck: Supple, no deformities, masses, or tenderness Lungs: Bilateral rhonchi Heart: Regular rate and rhythm, S1 and S2 normal, no murmurs, rubs auscultated Abdomen: BS normoactive,soft,nondistended,non-tender to palpation,no organomegaly Extremities: No pretibial edema, no erythema, no cyanosis, no clubbing Neuro : Alert and oriented to time, place and person, No focal deficits   Data Reviewed: Basic Metabolic Panel:  Recent Labs Lab 07/17/2014 1710  NA 141  K 5.0  CL 103  CO2 27  GLUCOSE 124*  BUN 58*  CREATININE 2.38*  CALCIUM 8.6   Liver Function Tests: No results for input(s): AST, ALT, ALKPHOS, BILITOT, PROT, ALBUMIN in the last 168 hours. No results for input(s): LIPASE, AMYLASE in the last 168 hours. No results for input(s): AMMONIA in the last 168 hours. CBC:  Recent Labs Lab 07/02/2014 1710  WBC 6.0  HGB 8.1*  HCT 26.4*  MCV 102.7*  PLT 363   Cardiac Enzymes:  Recent  Labs Lab 06/22/2014 1710  TROPONINI 0.19*   BNP (last 3 results)  Recent Labs  07/17/2014 1722  BNP 119.3*    ProBNP (last 3 results)  Recent Labs  04/21/14 1131  PROBNP 25.0    CBG:  Recent Labs Lab 06/15/14 0702  GLUCAP 119*    No results found for this or any previous visit (from the past 240 hour(s)).   Studies: Dg Chest 2 View (if Patient Has Fever And/or Copd)  06/29/2014   CLINICAL DATA:  Progressive weakness for several days.  EXAM: CHEST  2 VIEW  COMPARISON:  06/15/2014  FINDINGS: There is mild cardiac enlargement. Calcified atherosclerotic plaque involves the aortic arch. Large right pleural effusion is stable to increased in volume from previous exam. Asymmetric opacification of the right lung corresponding to known lymphangitic spread of tumor.  IMPRESSION: 1. Stable to increase in volume of right pleural effusion. 2. Opacification of the right lung corresponding to extensive lymphangitic spread of tumor throughout the right lung.   Electronically Signed   By: Kerby Moors M.D.   On: 06/26/2014 18:59    Scheduled Meds: . atazanavir-cobicistat  1 tablet Oral Daily  . ipratropium-albuterol  3 mL Nebulization Once  . lamiVUDine  100 mg Oral Daily  . mirtazapine  15 mg Oral QHS  . pantoprazole  40 mg Oral BID  . silver sulfADIAZINE   Topical Daily  . zidovudine  300 mg Oral Q12H   Continuous Infusions:   Active Problems:   Human immunodeficiency virus (HIV) disease   Pleural effusion on right   Protein-calorie malnutrition, severe   Lung cancer   Acute respiratory failure   GERD (gastroesophageal reflux disease)   HLD (hyperlipidemia)   Chronic kidney disease, stage 4, severely decreased GFR    Time spent: 25 min    [facility]  [facility] Pager [phone]*. If 7PM-7AM, please contact night-coverage at [email or web address], password [facility] 06/21/2014, 9:40 AM  LOS: 1 day

## 2014-06-21 NOTE — Progress Notes (Signed)
Pt continued to be very short of breath even on mask at 14 L. Md notified and breathing treatment ordered.

## 2014-06-21 NOTE — Plan of Care (Signed)
Problem: Phase III Progression Outcomes Goal: IV/normal saline lock discontinued Outcome: Progressing IV saline locked

## 2014-06-21 NOTE — Progress Notes (Signed)
Pt currently admitted to 1322 at [facility]. Has opted for hospice care.

## 2014-06-21 NOTE — Plan of Care (Signed)
Problem: Phase III Progression Outcomes Goal: Voiding independently Outcome: Progressing Patient has a condom catheter.

## 2014-06-21 NOTE — Care Management Note (Signed)
CARE MANAGEMENT NOTE 06/21/2014  Patient:  [name],[name]   Account Number:  [id number]  Date Initiated:  06/21/2014  Documentation initiated by:  Marney Doctor  Subjective/Objective Assessment:   69 yo admitted with lung CA     Action/Plan:   From [facility]   Anticipated DC Date:  06/22/2014   Anticipated DC Plan:  [place]  In-house referral  Clinical Social Worker      DC Planning Services  CM consult      Choice offered to / List presented to:             Status of service:  In process, will continue to follow Medicare Important Message given?   (If response is "NO", the following Medicare IM given date fields will be blank) Date Medicare IM given:   Medicare IM given by:   Date Additional Medicare IM given:   Additional Medicare IM given by:    Discharge Disposition:    Per UR Regulation:  Reviewed for med. necessity/level of care/duration of stay  If discussed at [place] of Stay Meetings, dates discussed:    Comments:  06/21/14 Marney Doctor RN,BSN,NCM Chart reviewed and CM following for DC needs. Pt is being referred to [facility].

## 2014-06-21 NOTE — Progress Notes (Signed)
UR completed 

## 2014-06-21 NOTE — Progress Notes (Signed)
Breathing did not improve upon receiving treatment. Pt continue to be short of breath and sound very wet.Md notified and Lasix ordered. Will continue to monitor and provide comfort.

## 2014-06-21 NOTE — Progress Notes (Signed)
Radiation Oncology         [phone]) [phone] ________________________________  Name: [name] [name] MRN: [id number]  Date: 06/26/2014  DOB: February 07, 1946  Follow-Up Visit Note  CC: Nena Jordan, Mammie Lorenzo, MD  Heath Lark, MD  Diagnosis:  Lung cancer (based on imaging - poorpy differentiated tumor on path)  Interval Since Last Radiation:  n/a   Narrative:  The patient returns today for routine follow-up.  The patient is not feeling well. He is requiring increased O2 today at 4L, O2 sats still in high 80s. Dehydrated, fatigued. He denies fever/ chills. The patient's wound has been healing.  The patient underwent a PET scan and returns today to review this.                              ALLERGIES:  has No Known Allergies.  Meds: No current facility-administered medications for this encounter.   No current outpatient prescriptions on file.   Facility-Administered Medications Ordered in Other Encounters  Medication Dose Route Frequency Provider Last Rate Last Dose  . atazanavir-cobicistat (Evotaz) 300-150 MG per tablet 1 tablet  1 tablet Oral Daily Waldemar Dickens, MD   1 tablet at 06/29/2014 2314  . ipratropium-albuterol (DUONEB) 0.5-2.5 (3) MG/3ML nebulizer solution 3 mL  3 mL Nebulization Once Ephraim Hamburger, MD   3 mL at 06/26/2014 1737  . lamiVUDine (EPIVIR) 10 MG/ML solution 100 mg  100 mg Oral Daily Waldemar Dickens, MD   100 mg at 07/03/2014 2313  . levalbuterol (XOPENEX) nebulizer solution 1.25 mg  1.25 mg Nebulization Q6H PRN Gardiner Barefoot, NP   1.25 mg at 06/21/14 0100  . mirtazapine (REMERON) tablet 15 mg  15 mg Oral QHS Waldemar Dickens, MD   15 mg at 06/26/2014 2314  . morphine 4 MG/ML injection 4 mg  4 mg Intravenous Q2H PRN Waldemar Dickens, MD   4 mg at 06/21/14 [phone]  . ondansetron (ZOFRAN) tablet 4 mg  4 mg Oral Q6H PRN Waldemar Dickens, MD       Or  . ondansetron [facility]) injection 4 mg  4 mg Intravenous Q6H PRN Waldemar Dickens, MD      . pantoprazole (PROTONIX) EC tablet 40  mg  40 mg Oral BID Waldemar Dickens, MD   40 mg at 07/09/2014 2319  . silver sulfADIAZINE (SILVADENE) 1 % cream   Topical Daily Waldemar Dickens, MD      . zidovudine (RETROVIR) capsule 300 mg  300 mg Oral Q12H Waldemar Dickens, MD   300 mg at 07/15/2014 2314  . zolpidem (AMBIEN) tablet 5 mg  5 mg Oral QHS PRN Waldemar Dickens, MD        Physical Findings: The patient is in no acute distress. Patient is alert and oriented.  height is 5\' 6"  (1.676 m) and weight is 110 lb 9.6 oz (50.168 kg). His oral temperature is 98.3 F (36.8 C). His blood pressure is 99/53 and his pulse is 124. His respiration is 24 and oxygen saturation is 89%. .   Crackles present Tachycardic/regular rhythm Incision looks much improved - superficial area continues to heal but deep aspects of wound have filled in. No sign infection.  Lab Findings: Lab Results  Component Value Date   WBC 6.0 07/01/2014   HGB 8.1* 07/16/2014   HCT 26.4* 06/25/2014   MCV 102.7* 07/07/2014   PLT 363 07/13/2014     Radiographic Findings:  Dg Chest 2 View (if Patient Has Fever And/or Copd)  07/09/2014   CLINICAL DATA:  Progressive weakness for several days.  EXAM: CHEST  2 VIEW  COMPARISON:  06/15/2014  FINDINGS: There is mild cardiac enlargement. Calcified atherosclerotic plaque involves the aortic arch. Large right pleural effusion is stable to increased in volume from previous exam. Asymmetric opacification of the right lung corresponding to known lymphangitic spread of tumor.  IMPRESSION: 1. Stable to increase in volume of right pleural effusion. 2. Opacification of the right lung corresponding to extensive lymphangitic spread of tumor throughout the right lung.   Electronically Signed   By: Kerby Moors M.D.   On: 06/23/2014 18:59   Dg Chest 2 View  06/15/2014   CLINICAL DATA:  Right lower lobe neoplasm.  EXAM: CHEST  2 VIEW  COMPARISON:  PET-CT 06/15/2014.  Chest x-ray 05/19/2014.  FINDINGS: Mediastinum and hilar structures are stable.  Stable cardiomegaly with normal pulmonary vascularity. Diffuse bilateral pulmonary interstitial prominence noted, particularly on the right, consistent with previously reported lymphangitic tumor spread. Slight improvement from prior chest x-ray 05/19/2014. Atelectasis right lower lobe. Stable right pleural effusion. No acute bony abnormality.  IMPRESSION: 1. Persistent interstitial changes of lymphangitic tumor spread as noted on prior PET-CT of 06/15/2014. Interstitial changes have improved slightly from prior chest x-ray 05/19/2014. 2. Persistent right pleural effusion.   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  EXAM: NUCLEAR MEDICINE PET SKULL BASE TO THIGH  TECHNIQUE: 5.7 mCi F-18 FDG was injected intravenously. Full-ring PET imaging was performed from the skull base to thigh after the radiotracer. CT data was obtained and used for attenuation correction and anatomic localization.  FASTING BLOOD GLUCOSE:  Value: 119 mg/dl  COMPARISON:  CT of the chest, abdomen and pelvis 05/17/2014.  FINDINGS: NECK  No hypermetabolic lymph nodes in the neck. Specifically, the salivary glands demonstrate no focal hypermetabolism.  CHEST  The pleura in the right hemithorax is diffusely thickened and nodular in appearance, and is diffusely hypermetabolic (SUVmax = 0.2-54.1). There is also multifocal nodular and mass-like thickening throughout the right lung, most confluent in the right lower lobe where there is extensive hypermetabolism (SUVmax = 18.5). Given the extensive nodular thickening of the bronchovascular interstitium, findings are most compatible with widespread lymphangitic spread of malignancy in the right lung. Probable hypermetabolic right hilar lymph node (SUVmax = 6.2)and 9 mm hypermetabolic subcarinal lymph node (SUVmax = 5.9).  Moderate centrilobular and mild paraseptal emphysema. Small locule of gas in the right pleural space, presumably related to prior thoracentesis. Small pericardial effusion, new compared to the prior examination.  ABDOMEN/PELVIS  Within the central aspect of segment 7 of the liver there is a very ill-defined 1.4 cm low-attenuation lesion (image 107 of series 4) which demonstrates focal hypermetabolism (SUVmax = 15.1), suspicious for a hepatic metastasis. Additionally, there is a very subtle area of low-level metabolic activity (SUVmax = 3.3)in the liver at the junction of segments 2 and 4A, which is only slightly above background activity in the liver (SUVmax = 2.3), which could represent an additional metastatic lesion. No abnormal hypermetabolic activity within the pancreas, adrenal glands, or spleen. There is a small focus of of hypermetabolic activity in the antral pre-pyloric region of the stomach (SUVmax = 4.6), which is nonspecific. No hypermetabolic lymph nodes in the  abdomen or pelvis. Calcified gallstones lying dependently in the gallbladder. No current findings to suggest acute cholecystitis at this time. Abdominal and pelvic vasculature, including ectasia of the infrarenal abdominal aorta which measures up to 2.5 cm in diameter. No significant volume of ascites. No pneumoperitoneum.  SKELETON  No focal hypermetabolic activity to suggest skeletal metastasis.  IMPRESSION: 1. Extensive lymphangitic spread of disease throughout the right lung, with right hilar and subcarinal adenopathy, as well as a large malignant right pleural effusion. In addition, there is at least 1 lesion in the liver suspicious for hepatic metastasis in the medial aspect of segment 7, and potentially a smaller lesion at the junction of segments 2 and 4A. 2. No hypermetabolic activity in the region of the salivary glands to suggest a primary salivary gland malignancy. 3. Small focus of hypermetabolic activity in the antral pre-pyloric  region of the stomach. This is nonspecific. Mucosal activity is commonly seen in areas portions of the GI tract, this may simply be physiologic, however, correlation with endoscopy should be considered if not recently performed. 4. Cholelithiasis without evidence to suggest acute cholecystitis at this time. 5. Small pericardial effusion, increased compared to the prior examination.   Electronically Signed   By: Vinnie Langton M.D.   On: 06/15/2014 09:52    Impression:    The patient has no evidence of salivary gland tumor on PET scan. Clinically, this appears to represent a lung cancer with likely metastasis to liver based on PET scan. Extensive tumor present throughout right lung/ pleura. Too extensive in my opinion to proceed with palliaitve XRT.  I discussed this with the patient. Main options I believe are chemo vs. Supportive care. Patient is not sure he wants to proceed with chemo. The patient will see Dr. Alvy Bimler to discuss this  Plan:  Due to patient's vital signs, shortness of breath, he will be seen immediately in the symptom management clinic.  The patient was seen for 15 minutes today, the majority was spent on discussion of patient's cancer diagnosis and coordination of care.   Jodelle Gross, M.D., Ph.D.

## 2014-06-21 NOTE — Progress Notes (Signed)
Clinical Social Work Department BRIEF PSYCHOSOCIAL ASSESSMENT 06/21/2014  Patient:  [name] [name],[name] [name]     Account Number:  [id number]     Admit date:  07/08/2014  Clinical Social Worker:  Maryln Manuel  Date/Time:  06/21/2014 11:00 AM  Referred by:  Physician  Date Referred:  06/21/2014 Referred for  Residential hospice placement   Other Referral:   Interview type:  Patient Other interview type:   and patient family at bedside    PSYCHOSOCIAL DATA Living Status:  FACILITY Admitted from facility:  [facility] Level of care:  [place] Primary support name:  Sharyn Lull [id number]/[phone] Primary support relationship to patient:  FAMILY Degree of support available:   strong    CURRENT CONCERNS Current Concerns  Post-Acute Placement   Other Concerns:    SOCIAL WORK ASSESSMENT / PLAN CSW received referral that pt admitted from [facility] and pt transitioning to comfort measures and interested in Residential Hospice specifically [facility].    CSW met with patient at bedside. Family was present. CSW provided support as pt discussed that he wants to be comfortable and wants to go to a hospice facility that can meet his needs. Pt niece discussed that [facility] offered hospice, but pt family does not feel that pt care needs can be met at [facility] with hospice. CSW discussed process of residential hospice referral/placement. CSW provided list of hospice facilities and pt first choice is [facility]. CSW discussed that CSW will make referral to [facility]. CSW discussed that there are times when [facility] is full and secondary options have to be explored and pt and pt family expressed understanding. Pt reports strong support from pt niece and her husband who live in [place], but pt explains that he is house is very close to where [facility] is.    CSW contacted [facility] and  made referral. Awaiting determination.    CSW to continue to follow to provide support and assist with residential hospice placement.   Assessment/plan status:  Psychosocial Support/Ongoing Assessment of Needs Other assessment/ plan:   discharge planning   Information/referral to community resources:   Residential Hospice List    PATIENT'S/FAMILY'S RESPONSE TO PLAN OF CARE: Pt alert and oriented x 4. Pt clear in his wishes to be comfortable and feels that [facility] will be best environment for his end of life care. Pt niece and her husband supportive and actively involved in pt care. Pt eager to receive response from [facility] re: bed availability.    Alison Murray, MSW, [place] [facility] [phone]

## 2014-06-21 NOTE — Progress Notes (Signed)
Nutrition Brief Note  Pt identified as at nutrition risk on the Malnutrition Screen Tool  Chart reviewed. Pt now transitioning to comfort care.  No nutrition interventions warranted at this time.  Please consult as needed.   Clayton Bibles, MS, RD, LDN Pager: [phone] After Hours Pager: [phone]

## 2014-06-22 DIAGNOSIS — N184 Chronic kidney disease, stage 4 (severe): Secondary | ICD-10-CM | POA: Diagnosis present

## 2014-06-22 DIAGNOSIS — E780 Pure hypercholesterolemia: Secondary | ICD-10-CM | POA: Diagnosis present

## 2014-06-22 DIAGNOSIS — Z8249 Family history of ischemic heart disease and other diseases of the circulatory system: Secondary | ICD-10-CM | POA: Diagnosis not present

## 2014-06-22 DIAGNOSIS — J910 Malignant pleural effusion: Secondary | ICD-10-CM | POA: Diagnosis present

## 2014-06-22 DIAGNOSIS — Z66 Do not resuscitate: Secondary | ICD-10-CM | POA: Diagnosis present

## 2014-06-22 DIAGNOSIS — K219 Gastro-esophageal reflux disease without esophagitis: Secondary | ICD-10-CM | POA: Diagnosis present

## 2014-06-22 DIAGNOSIS — B20 Human immunodeficiency virus [HIV] disease: Secondary | ICD-10-CM | POA: Diagnosis present

## 2014-06-22 DIAGNOSIS — E860 Dehydration: Secondary | ICD-10-CM | POA: Diagnosis present

## 2014-06-22 DIAGNOSIS — E785 Hyperlipidemia, unspecified: Secondary | ICD-10-CM | POA: Diagnosis present

## 2014-06-22 DIAGNOSIS — C779 Secondary and unspecified malignant neoplasm of lymph node, unspecified: Secondary | ICD-10-CM | POA: Diagnosis present

## 2014-06-22 DIAGNOSIS — J189 Pneumonia, unspecified organism: Secondary | ICD-10-CM | POA: Diagnosis present

## 2014-06-22 DIAGNOSIS — Z87891 Personal history of nicotine dependence: Secondary | ICD-10-CM | POA: Diagnosis not present

## 2014-06-22 DIAGNOSIS — Z515 Encounter for palliative care: Secondary | ICD-10-CM | POA: Diagnosis not present

## 2014-06-22 DIAGNOSIS — J9601 Acute respiratory failure with hypoxia: Secondary | ICD-10-CM | POA: Diagnosis present

## 2014-06-22 DIAGNOSIS — C3431 Malignant neoplasm of lower lobe, right bronchus or lung: Secondary | ICD-10-CM | POA: Diagnosis present

## 2014-06-22 DIAGNOSIS — Z9981 Dependence on supplemental oxygen: Secondary | ICD-10-CM | POA: Diagnosis not present

## 2014-06-22 DIAGNOSIS — E43 Unspecified severe protein-calorie malnutrition: Secondary | ICD-10-CM | POA: Diagnosis present

## 2014-06-22 DIAGNOSIS — R0902 Hypoxemia: Secondary | ICD-10-CM | POA: Diagnosis present

## 2014-06-22 MED ORDER — LORAZEPAM 2 MG/ML IJ SOLN
1.0000 mg | Freq: Four times a day (QID) | INTRAMUSCULAR | Status: DC | PRN
  Administered 2014-06-22: 1 mg via INTRAVENOUS

## 2014-06-22 MED ORDER — SODIUM CHLORIDE 0.9 % IV SOLN: 1.0000 mg/h | INTRAVENOUS | Status: DC

## 2014-06-22 MED ORDER — LORAZEPAM 2 MG/ML IJ SOLN
INTRAMUSCULAR | Status: AC
  Filled 2014-06-22: qty 1

## 2014-06-22 MED ORDER — SODIUM CHLORIDE 0.9 % IV SOLN
1.0000 mg/h | INTRAVENOUS | Status: DC
  Administered 2014-06-22 – 2014-06-23 (×2): 1 mg/h via INTRAVENOUS
  Filled 2014-06-22 (×2): qty 10

## 2014-06-22 NOTE — Progress Notes (Addendum)
CSW continuing to follow.   Per MD, pt on morphine drip and not stable for transport to residential hospice. Anticipate hospital death.   CSW met with pt niece and her husband at bedside. Pt family coping appropriate and expressed gratefulness that pt is now comfortable. CSW provided support as pt family discussed that pt had periods of agitation during the night, but they feel pt is comfortable now that morphine drip has been initiated. Pt family shared that MD discussed with them that prognosis is hours to days and pt not stable for transport to residential hospice. Pt family was appreciative of MD explaining process of end of life.   CSW provided emotional support as they discussed how pt enjoyed cruises and has traveled to multiple places in his life. Pt family inquired about process of having pt cremated following pt death. Pt family stated that pt had mentioned [facility], but pt family stated that no arrangements have yet been made. Pt family shared that pt wishes are [place]. CSW provided pt family with contact information for [facility]. Pt family plans to contact [facility] to have plans in place following pt death.   Pt family appreciative of CSW visit and support.  CSW to continue to follow to provide support.   Alison Murray, MSW, [place] Work [phone]

## 2014-06-22 NOTE — Progress Notes (Signed)
Patient is comfortable, morphine drip runs at 1mg /hr,no agitation. Non rebreather inplace. Family at bedside.

## 2014-06-22 NOTE — Discharge Summary (Addendum)
Death Summary  Loron Weimer RUE:[id number] DOB: 1946-05-08 DOA: 05-Jul-2014  PCP: Cloyd Stagers, MD PCP/Office notified: Yes  Admit date: 2014/07/05 Date of Death: Jul 09, 2014  Final Diagnoses:  Active Problems:   Human immunodeficiency virus (HIV) disease   Pleural effusion on right   Protein-calorie malnutrition, severe   Lung cancer   Hypoxia   Acute respiratory failure   GERD (gastroesophageal reflux disease)   HLD (hyperlipidemia)   Chronic kidney disease, stage 4, severely decreased GFR    History of present illness:  69 y.o. male  Pt presenting w/ progressive SOB over the past several days. Currently on Ceftin for possible pna. Dx 2 wks ago. Up until today pt was living in a long term care facility and had an in crease in his O2 requirement. Feeling more weak. Pt discussed hospice w/ oncology and would like to pursue that step. Pt is at home (as of today) w/o home oxygen. Breathing treatments w/o benefit. H/o failed, painful chest tubes for pleural effusions in the past but does not want to repeat those at this time. Denies fevers, CP, palpitations, Le swelling   Hospital Course:  Patient came in acute respiratory failure with pleural effusion on the right and probably lymphangitic spread, it was discussed with the family extensively and with the patient and they decided to move towards comfort care. Home medications were stopped he was started on morphine drip and he passed 2.5.2016 at 6:45 am.   Time: 25 min  Signed:  Charlynne Cousins  [facility] July 09, 2014, 7:57 AM

## 2014-06-22 NOTE — Progress Notes (Signed)
Turned and repositioned patient,briefly opened his eyes and went back to sleep. No agitation. Comfortable. Family at bedside.

## 2014-06-22 NOTE — Progress Notes (Signed)
[facility] PROGRESS NOTE   Assessment/Plan: Acute respiratory failure multifactorial due to   Pleural effusion on right and lymphangitic spread: - pt is struggling to breath. With erratic breathing patterns. - I think he is not stable for transport. - I spoke to the family's about my concern the swelling of the legs and the mottling concerned that he only might have a few hours and due to his erratic breathing and his agitation which is probably due to hypoxia and we both agree to start him on morphine drip to make him comfortable.  Human immunodeficiency virus (HIV) disease - agree with discontinuing HIV meds.   Metastatic lung cancer stage IV: - No further treatment.  Code Status: *DNR Family Communication: *Discussed with patient's niece and her husband at bedside Disposition Plan: Residential hospice   Consultants:  None  Procedures:  none  Antibiotics:  None  HPI/Subjective: Nonverbal with a radical breathing patterns. Son at bedside agreed with plan.  Objective: Filed Vitals:   06/21/14 1357 06/21/14 2100 06/21/14 2107 06/22/14 0501  BP: 91/43 118/60  101/55  Pulse: 119 128  124  Temp: 99.5 F (37.5 C) 99.4 F (37.4 C)  98.1 F (36.7 C)  TempSrc: Oral Oral  Oral  Resp: 22 24  22   Height:      Weight:      SpO2: 95% 79% 98% 98%    Intake/Output Summary (Last 24 hours) at 06/22/14 0817 Last data filed at 06/21/14 2107  Gross per 24 hour  Intake    240 ml  Output    500 ml  Net   -260 ml   Filed Weights   07/09/2014 2224  Weight: 52.5 kg (115 lb 11.9 oz)    Exam:  General: Using X [place] muscles to breathe with erratic breathing pattern. HEENT: No bruits, no goiter.  Heart: Regular rate and rhythm. Lower extremity swelling with some mottling. Lungs: Moderate air movement with rhonchi's and crackles bilaterally. Abdomen: Soft, nontender, nondistended, positive bowel sounds.  Neuro: Grossly intact, nonfocal.   Data Reviewed: Basic  Metabolic Panel:  Recent Labs Lab 07/06/2014 1710  NA 141  K 5.0  CL 103  CO2 27  GLUCOSE 124*  BUN 58*  CREATININE 2.38*  CALCIUM 8.6   Liver Function Tests: No results for input(s): AST, ALT, ALKPHOS, BILITOT, PROT, ALBUMIN in the last 168 hours. No results for input(s): LIPASE, AMYLASE in the last 168 hours. No results for input(s): AMMONIA in the last 168 hours. CBC:  Recent Labs Lab 06/25/2014 1710  WBC 6.0  HGB 8.1*  HCT 26.4*  MCV 102.7*  PLT 363   Cardiac Enzymes:  Recent Labs Lab 07/07/2014 1710  TROPONINI 0.19*   BNP (last 3 results)  Recent Labs  07/12/2014 1722  BNP 119.3*    ProBNP (last 3 results)  Recent Labs  04/21/14 1131  PROBNP 25.0    CBG: No results for input(s): GLUCAP in the last 168 hours.  No results found for this or any previous visit (from the past 240 hour(s)).   Studies: Dg Chest 2 View (if Patient Has Fever And/or Copd)  07/07/2014   CLINICAL DATA:  Progressive weakness for several days.  EXAM: CHEST  2 VIEW  COMPARISON:  06/15/2014  FINDINGS: There is mild cardiac enlargement. Calcified atherosclerotic plaque involves the aortic arch. Large right pleural effusion is stable to increased in volume from previous exam. Asymmetric opacification of the right lung corresponding to known lymphangitic spread of tumor.  IMPRESSION:  1. Stable to increase in volume of right pleural effusion. 2. Opacification of the right lung corresponding to extensive lymphangitic spread of tumor throughout the right lung.   Electronically Signed   By: Kerby Moors M.D.   On: 07/16/2014 18:59    Scheduled Meds: . atropine  2 drop Sublingual QID  . ipratropium-albuterol  3 mL Nebulization Once   Continuous Infusions: . morphine       Charlynne Cousins  [facility] Pager [phone]. If 8PM-8AM, please contact night-coverage at [email or web address], password [facility] 06/22/2014, 8:17 AM  LOS: 2 days

## 2014-06-22 NOTE — Progress Notes (Signed)
Morphine drip initiated at 1mg /hr as per MD order,witnessed by Bunnie Philips. RN.

## 2014-06-22 NOTE — Progress Notes (Signed)
UR completed 

## 2014-06-23 ENCOUNTER — Encounter: Payer: Self-pay | Admitting: *Deleted

## 2014-06-23 DIAGNOSIS — J9600 Acute respiratory failure, unspecified whether with hypoxia or hypercapnia: Secondary | ICD-10-CM

## 2014-06-23 NOTE — Progress Notes (Signed)
Patient recently started being followed by [place] [employer] [facility] on behalf of his [facility] insurance. Noted patient will likely to expire in hospital. Will update [facility] community team. Made inpatient licensed CSW aware [facility] has been following. Marthenia Rolling, MSN-RN,BSN- [facility] [phone]

## 2014-06-23 NOTE — Progress Notes (Signed)
Pt remains unresponsive, comfortable in appearance throughout day.  Respirations have decreased to 6-8/min with 15 - 20 seconds of apnea at times.  Niece Sharyn Lull and husband left hospital this morning after keeping vigil through night.  Are realistic and prepared for patient's death.

## 2014-06-23 NOTE — Progress Notes (Signed)
240cc of morphine wasted with witness Raynelle Dick, RN as new bag hung to replace expired bag.

## 2014-06-23 NOTE — Progress Notes (Signed)
[place] Psychosocial Distress Screening Clinical Social Work  Clinical Social Work was referred by distress screening protocol.  The patient scored a 8 on the Psychosocial Distress Thermometer which indicates severe distress. Clinical Social Worker reviewed chart, patient currently admitted to [facility].  Patient being supported by inpatient Clinical Social Work and Palliative Medicine Team.  Proposed plan for residential hospice placement.  ONCBCN DISTRESS SCREENING 06/06/2014  Screening Type Initial Screening  Distress experienced in past week (1-10) 8  Emotional problem type Depression;Nervousness/Anxiety;Adjusting to illness;Isolation/feeling alone;Boredom  Information Concerns Type Lack of info about diagnosis;Lack of info about treatment  Physical Problem type Pain;Sleep/insomnia;Bathing/dressing;Breathing;Getting around;Loss of appetitie;Tingling hands/feet  Physician notified of physical symptoms Yes  Referral to clinical social work Yes  Referral to dietition Yes    Clinical Social Worker follow up needed: No.  If yes, follow up plan:  CSW will follow as needed.  Polo Riley, MSW, LCSW, OSW-C Clinical Social Worker [facility] [phone]

## 2014-06-27 ENCOUNTER — Ambulatory Visit: Payer: Commercial Managed Care - HMO | Admitting: Internal Medicine

## 2014-07-04 ENCOUNTER — Ambulatory Visit: Payer: Commercial Managed Care - HMO

## 2014-07-05 ENCOUNTER — Ambulatory Visit: Payer: Commercial Managed Care - HMO

## 2014-07-06 ENCOUNTER — Ambulatory Visit: Payer: Commercial Managed Care - HMO | Admitting: Cardiothoracic Surgery

## 2014-07-19 NOTE — Progress Notes (Signed)
   [facility] PROGRESS NOTE Assessment/Plan: Acute respiratory failure multifactorial due to Pleural effusion on right and lymphangitic spread: - I spoke to the family's about my concern the swelling of the legs and the mottling concerned that he only might have a few hours and due to his erratic breathing and his agitation which is probably due to hypoxia and we both agree to start him on morphine drip. - He continues to lay comfortably on a morphine drip.  Human immunodeficiency virus (HIV) disease - Stop his HIV medication  Metastatic lung cancer stage IV: - No further treatment.  Code Status: *DNR Family Communication: *Discussed with patient's niece and her husband at bedside Disposition Plan: Residential hospice      HPI/Subjective: Resting comfortably.  Objective: Filed Vitals:   06/22/14 1513 06/22/14 1900 06/23/14 0519 06/23/14 0700  BP: 101/49 78/50 59/32  60/32  Pulse: 113 101 102   Temp: 96.8 F (36 C) 94.6 F (34.8 C) 97.9 F (36.6 C)   TempSrc: Oral Axillary Axillary   Resp: 12 10 10    Height:      Weight:      SpO2: 98% 95% 97%    No intake or output data in the 24 hours ending June 25, 2014 0759 Filed Weights   06/29/2014 2224  Weight: 52.5 kg (115 lb 11.9 oz)    Exam:  General:  in no acute distress.  HEENT: No bruits, no goiter.  Heart: Regular rate and rhythm, without murmurs, rubs, gallops.  Lungs: Good air movement, clear Abdomen: Soft, nontender, nondistended, positive bowel sounds.     Data Reviewed: Basic Metabolic Panel:  Recent Labs Lab 07/13/2014 1710  NA 141  K 5.0  CL 103  CO2 27  GLUCOSE 124*  BUN 58*  CREATININE 2.38*  CALCIUM 8.6   Liver Function Tests: No results for input(s): AST, ALT, ALKPHOS, BILITOT, PROT, ALBUMIN in the last 168 hours. No results for input(s): LIPASE, AMYLASE in the last 168 hours. No results for input(s): AMMONIA in the last 168 hours. CBC:  Recent Labs Lab 07/17/2014 1710  WBC 6.0    HGB 8.1*  HCT 26.4*  MCV 102.7*  PLT 363   Cardiac Enzymes:  Recent Labs Lab 06/22/2014 1710  TROPONINI 0.19*   BNP (last 3 results)  Recent Labs  07/17/2014 1722  BNP 119.3*    ProBNP (last 3 results)  Recent Labs  04/21/14 1131  PROBNP 25.0    CBG: No results for input(s): GLUCAP in the last 168 hours.  No results found for this or any previous visit (from the past 240 hour(s)).   Studies: No results found.  Scheduled Meds: . atropine  2 drop Sublingual QID  . ipratropium-albuterol  3 mL Nebulization Once   Continuous Infusions: . morphine 1 mg/hr (06/23/14 0754)     Charlynne Cousins  [facility] Pager [phone]. If 8PM-8AM, please contact night-coverage at [email or web address], password [email or web address] Jun 25, 2014, 7:59 AM  LOS: 4 days

## 2014-07-19 NOTE — Progress Notes (Signed)
Wasted 260 cc Morphine drip in sink.  Witnessed by Reyne Dumas, RN.

## 2014-07-19 NOTE — Progress Notes (Addendum)
Called to room by patient's niece.  Patient found without respirations or pulse.  Patient pronounced at [facility] by myself and Lars Masson, RN.  Dr. Olevia Bowens notified.  Patient's niece offered support and she is appreciative of patient's care.  Zandra Abts [facility]  07/01/2014  7:05 AM

## 2014-07-19 DEATH — deceased

## 2014-09-08 ENCOUNTER — Ambulatory Visit: Payer: Medicare HMO | Admitting: Internal Medicine

## 2015-04-30 IMAGING — CR DG CHEST 1V PORT
1 series · 1 of 1 positions shown · non-contrast
Comparison: May 16, 2014

CLINICAL DATA: Difficulty breathing

EXAM:
PORTABLE CHEST - 1 VIEW

[AP]
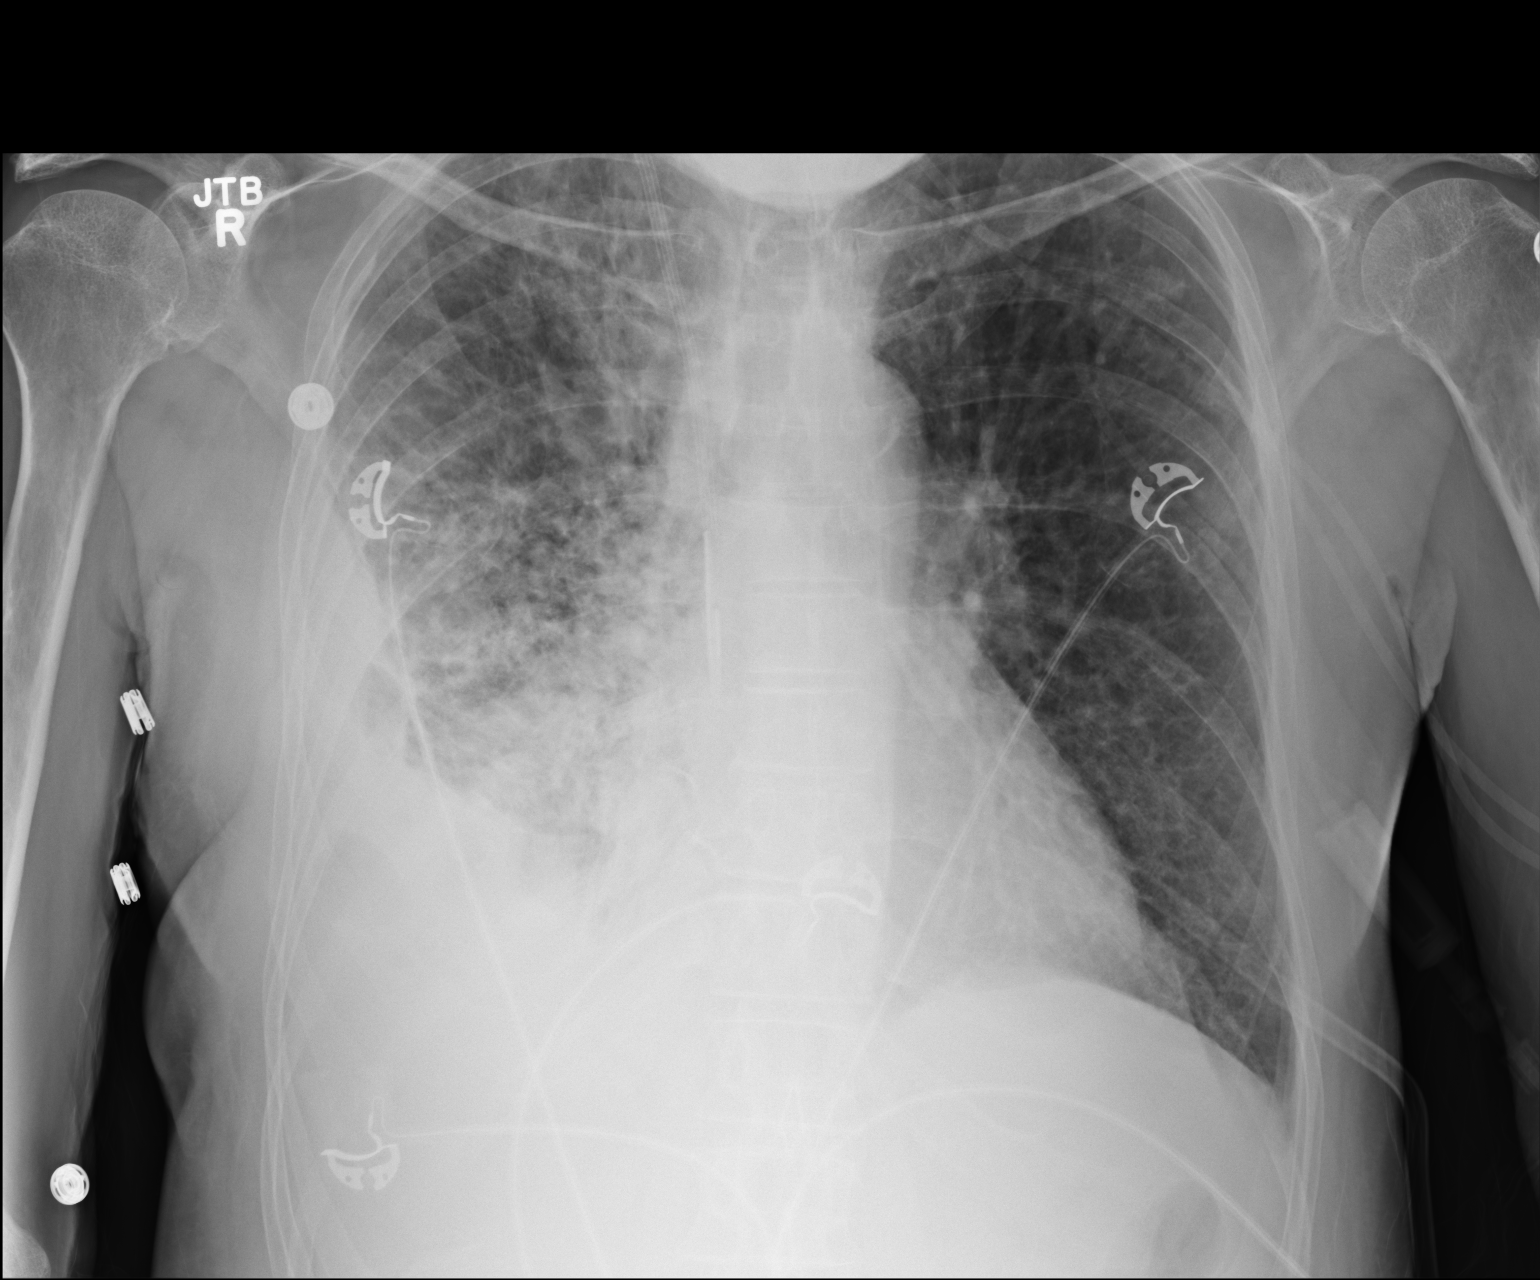

[1 of 1 positions shown; findings below may reference images not displayed]

FINDINGS: Central catheter tip is in the superior vena cava near the
cavoatrial junction, stable. A small loculated pneumothorax in the
right base remains stable. No new or expanding pneumothorax.
Consolidation with loculated effusion on the right persists. There
is generalized interstitial prominence,, largely due to underlying
interstitial fibrosis. There may be some superimposed interstitial
edema. Heart size and pulmonary vascularity are normal. No
adenopathy.
IMPRESSION: Persistent loculated pneumothorax right base. Right sided pleural
effusion, at least partly loculated. Suspect underlying interstitial
fibrosis, although there may be a degree of superimposed
interstitial edema. There is consolidation right base, stable. No
change in cardiac silhouette. No new opacity.

## 2015-04-30 IMAGING — CT CT ABD-PELV W/ CM
2 of 6 series · 4 of 46 positions shown, 5 images · IV contrast (Iodine)
Comparison: 04/25/2014

CLINICAL DATA: HIV with recurrent sepsis. Recurrent pleural
effusion on the right. Shortness of breath.

EXAM:
CT ABDOMEN AND PELVIS WITH CONTRAST
TECHNIQUE: Multidetector CT imaging of the abdomen and pelvis was performed
using the standard protocol following bolus administration of
intravenous contrast.
CONTRAST:  100 cc of Omni 300

[Series 206: cor · coronal · 0.50mm/px · 3 of 105 slices shown, 4 images]
[im 24/105  soft-tissue]
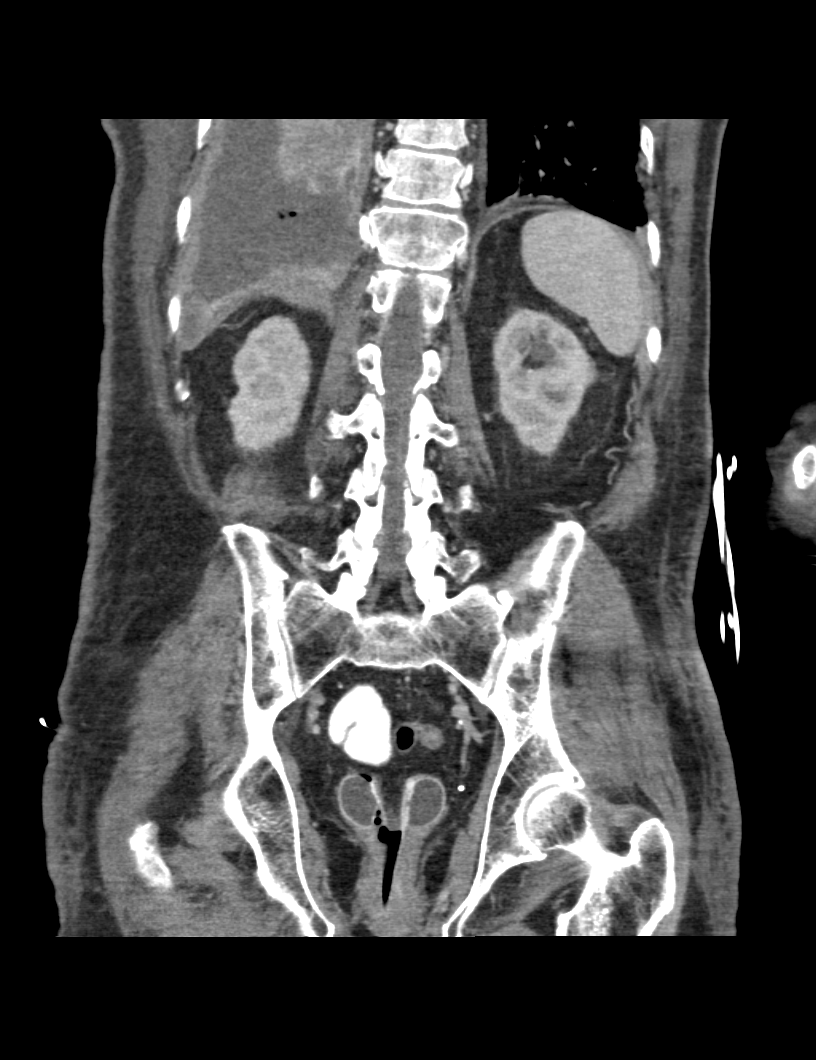
[im 24/105  bone]
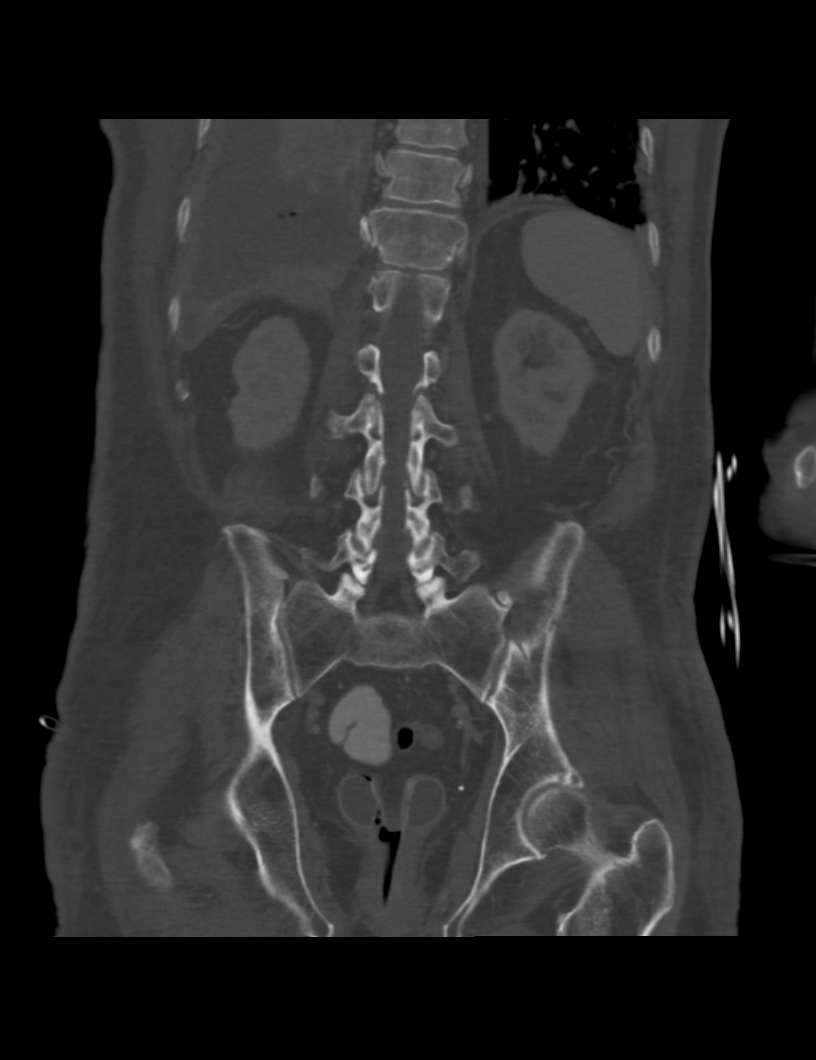
[im 58/105  soft-tissue]
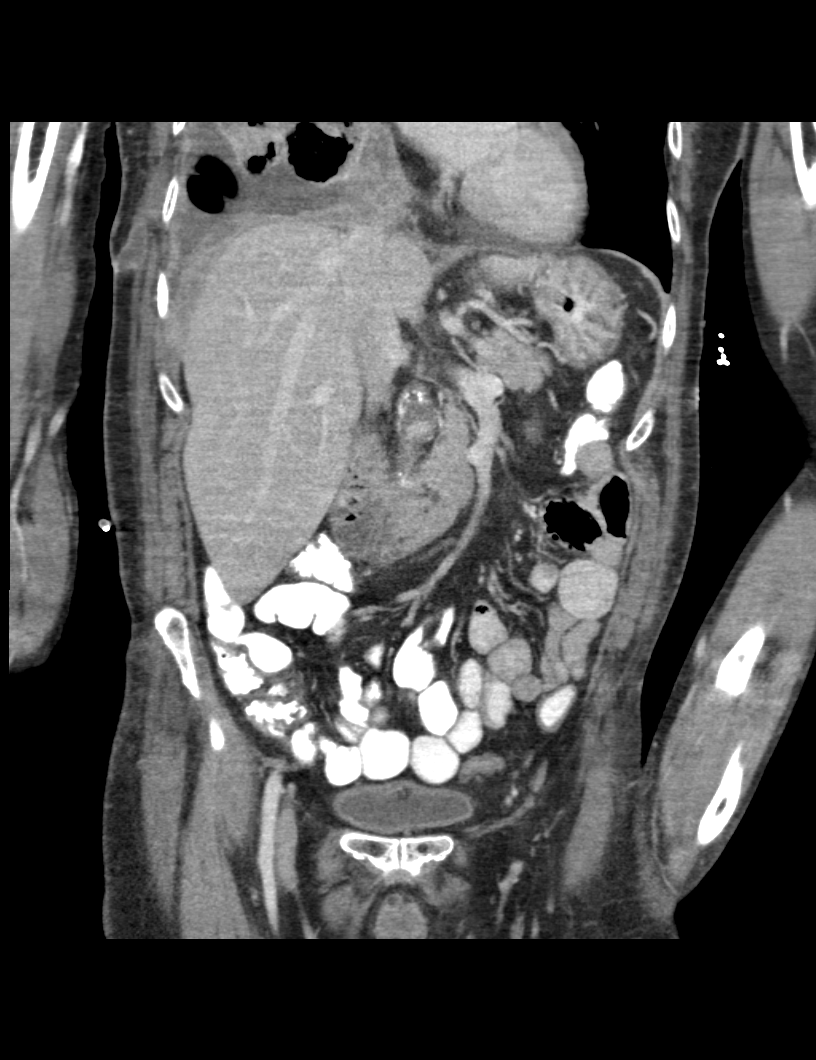
[im 81/105  soft-tissue]
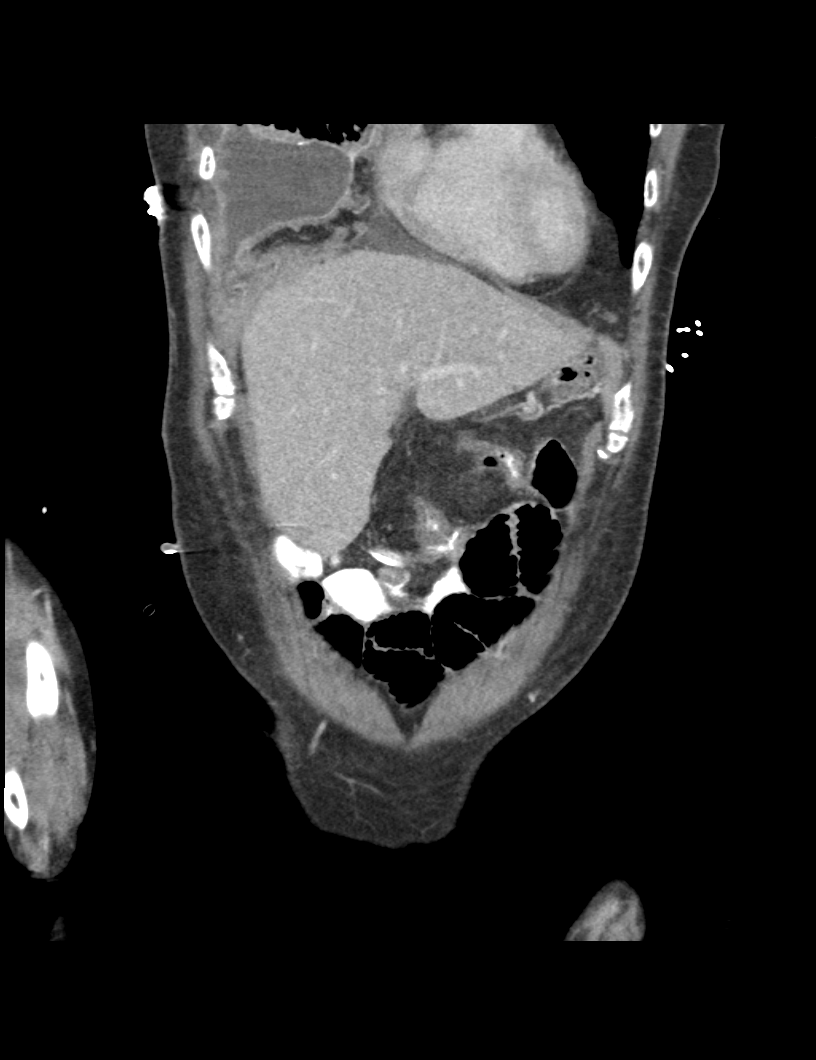

[Series 208: sag · sagittal · 0.50mm/px · 1 of 154 slices shown]
[im 52/154  soft-tissue]
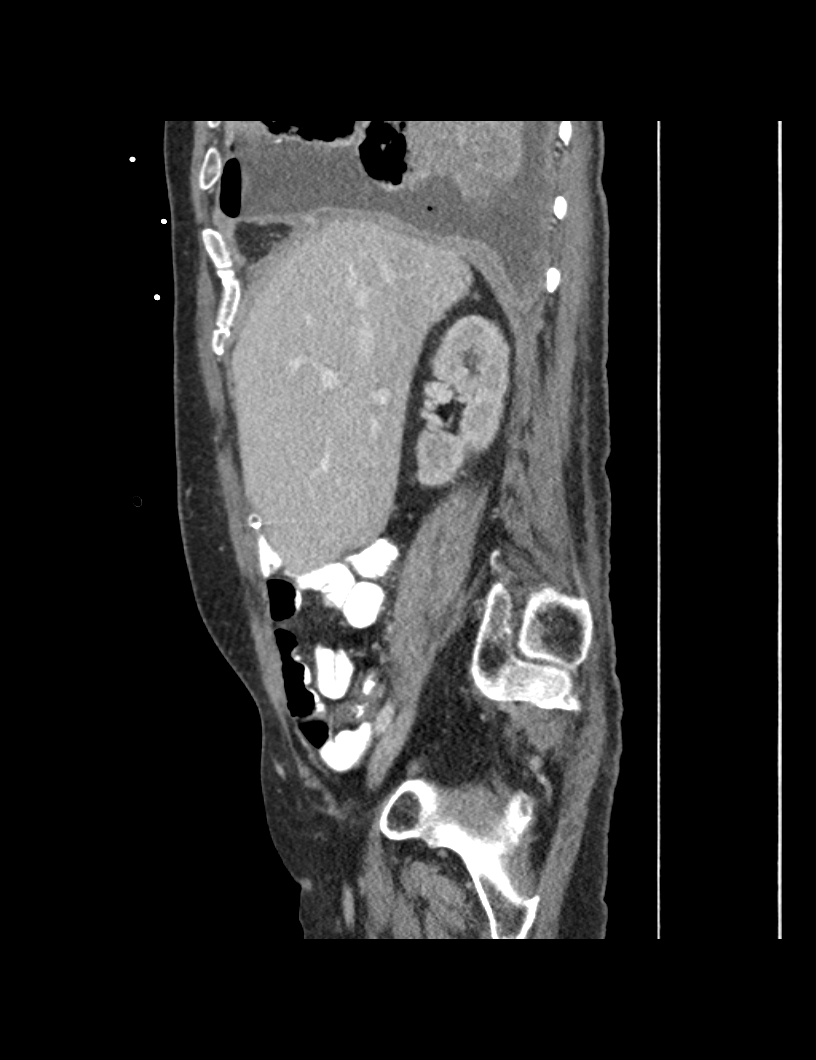

[4 of 46 positions shown; findings below may reference images not displayed]

FINDINGS: Lower chest: Moderate to large loculated right pleural effusion is
identified which contains multiple fluid levels. Enhancing rind of
soft tissue overlies the right pleural effusion. There is extensive
enhancement of both the parietal and visceral pleural. Small left
pleural effusion is noted. Prominent lymph nodes are identified at
the right CP angle. The largest measures 1 cm, image 13/ series 201.

Hepatobiliary: The liver has a slightly nodular contour.
Low-attenuation structure within the lateral segment of left hepatic
lobe measures 5 mm, image 28/series 201. Stone within the
gallbladder is identified measuring 9 mm. No biliary dilatation
identified.

Pancreas: Unremarkable appearance of the pancreas.

Spleen: The spleen is normal.

Adrenals/Urinary Tract: The adrenal glands are both normal. Normal
appearance of the kidneys. The urinary bladder is collapsed around a
Foley catheter balloon.

Stomach/Bowel: The stomach appears normal. There appears to be a
defect associated with the anterior wall of the distal stomach or
proximal duodenum, image 32/series 201. A surgical drainage catheter
is identified in this area. There is no extravasation of contrast
material. The mid and distal small bowel loops appear normal.
Unremarkable appearance of the colon. A rectal tube is in place. No
significant free intraperitoneal air are identified.

Vascular/Lymphatic: Calcified atherosclerotic disease involves the
abdominal aorta. The abdominal aorta measures 2.6 cm in maximum AP
dimension. There is no retroperitoneal or mesenteric adenopathy.

Reproductive: Prostate gland and seminal vesicles are unremarkable.

Other: No focal fluid collections identified. Ventral abdominal wall
wound is identified.

Musculoskeletal: Review of the visualized bony structures is
unremarkable.
IMPRESSION: 1. Defect within the ventral wall of the distal stomach/ proximal
duodenum is identified and presumably represents patient's ulcer.
There is a small amount of air which appears extra luminal. This is
directly adjacent to the indwelling surgical drain. No extravasation
of contrast material identified however. And there is no significant
free intraperitoneal air identified elsewhere in the abdomen or
pelvis.
2. Extensively loculated right pleural effusion with abnormal
enhancing pleural. Findings may be the sequelae of malignancy or
infection. See CT chest report from today.
3. Gallstone
4. Atherosclerosis. Ectatic abdominal aorta at risk for aneurysm
development. Recommend followup by US in 5 years. This
recommendation follows ACR consensus guidelines: White Paper of the
ACR Incidental Findings Committee II on Vascular Findings. [HOSPITAL] 8275; [DATE].

## 2015-05-02 DIAGNOSIS — C3490 Malignant neoplasm of unspecified part of unspecified bronchus or lung: Secondary | ICD-10-CM

## 2015-05-02 HISTORY — DX: Malignant neoplasm of unspecified part of unspecified bronchus or lung: C34.90
